# Patient Record
Sex: Male | Born: 1940
Health system: Southern US, Community
[De-identification: ages and names within clinical notes are randomized; demographics above are authoritative.]

## PROBLEM LIST (undated history)

## (undated) DIAGNOSIS — R739 Hyperglycemia, unspecified: Secondary | ICD-10-CM

## (undated) DIAGNOSIS — M1990 Unspecified osteoarthritis, unspecified site: Secondary | ICD-10-CM

## (undated) DIAGNOSIS — D509 Iron deficiency anemia, unspecified: Secondary | ICD-10-CM

## (undated) DIAGNOSIS — K315 Obstruction of duodenum: Secondary | ICD-10-CM

## (undated) DIAGNOSIS — B9681 Helicobacter pylori [H. pylori] as the cause of diseases classified elsewhere: Secondary | ICD-10-CM

## (undated) DIAGNOSIS — I2510 Atherosclerotic heart disease of native coronary artery without angina pectoris: Secondary | ICD-10-CM

## (undated) DIAGNOSIS — K2970 Gastritis, unspecified, without bleeding: Secondary | ICD-10-CM

## (undated) DIAGNOSIS — Z860100 Personal history of colon polyps, unspecified: Secondary | ICD-10-CM

## (undated) DIAGNOSIS — K219 Gastro-esophageal reflux disease without esophagitis: Secondary | ICD-10-CM

## (undated) DIAGNOSIS — K56609 Unspecified intestinal obstruction, unspecified as to partial versus complete obstruction: Secondary | ICD-10-CM

## (undated) DIAGNOSIS — K5730 Diverticulosis of large intestine without perforation or abscess without bleeding: Secondary | ICD-10-CM

## (undated) DIAGNOSIS — I255 Ischemic cardiomyopathy: Secondary | ICD-10-CM

## (undated) DIAGNOSIS — I219 Acute myocardial infarction, unspecified: Secondary | ICD-10-CM

## (undated) DIAGNOSIS — N183 Chronic kidney disease, stage 3 unspecified: Secondary | ICD-10-CM

## (undated) DIAGNOSIS — E7800 Pure hypercholesterolemia, unspecified: Secondary | ICD-10-CM

## (undated) DIAGNOSIS — K260 Acute duodenal ulcer with hemorrhage: Secondary | ICD-10-CM

## (undated) DIAGNOSIS — K760 Fatty (change of) liver, not elsewhere classified: Secondary | ICD-10-CM

## (undated) DIAGNOSIS — I10 Essential (primary) hypertension: Secondary | ICD-10-CM

## (undated) DIAGNOSIS — Z8719 Personal history of other diseases of the digestive system: Secondary | ICD-10-CM

## (undated) DIAGNOSIS — G8320 Monoplegia of upper limb affecting unspecified side: Secondary | ICD-10-CM

## (undated) HISTORY — DX: Hyperglycemia, unspecified: R73.9

## (undated) HISTORY — DX: Iron deficiency anemia, unspecified: D50.9

## (undated) HISTORY — DX: Personal history of colonic polyps: Z86.010

## (undated) HISTORY — PX: WRIST SURGERY: SHX841

## (undated) HISTORY — PX: SHOULDER SURGERY: SHX246

## (undated) HISTORY — DX: Personal history of colon polyps, unspecified: Z86.0100

## (undated) HISTORY — DX: Unspecified intestinal obstruction, unspecified as to partial versus complete obstruction: K56.609

## (undated) HISTORY — PX: COLONOSCOPY: SHX174

## (undated) HISTORY — PX: OTHER SURGICAL HISTORY: SHX169

## (undated) HISTORY — DX: Diverticulosis of large intestine without perforation or abscess without bleeding: K57.30

## (undated) HISTORY — DX: Gastro-esophageal reflux disease without esophagitis: K21.9

## (undated) HISTORY — DX: Atherosclerotic heart disease of native coronary artery without angina pectoris: I25.10

## (undated) HISTORY — DX: Acute myocardial infarction, unspecified: I21.9

## (undated) HISTORY — DX: Acute duodenal ulcer with hemorrhage: K26.0

## (undated) HISTORY — DX: Helicobacter pylori (H. pylori) as the cause of diseases classified elsewhere: B96.81

## (undated) HISTORY — DX: Obstruction of duodenum: K31.5

## (undated) HISTORY — PX: KNEE SURGERY: SHX244

## (undated) HISTORY — DX: Helicobacter pylori (H. pylori) as the cause of diseases classified elsewhere: K29.70

## (undated) HISTORY — DX: Pure hypercholesterolemia, unspecified: E78.00

## (undated) HISTORY — DX: Fatty (change of) liver, not elsewhere classified: K76.0

---

## 2002-10-12 DIAGNOSIS — I2510 Atherosclerotic heart disease of native coronary artery without angina pectoris: Secondary | ICD-10-CM

## 2002-10-12 HISTORY — DX: Atherosclerotic heart disease of native coronary artery without angina pectoris: I25.10

## 2003-09-02 ENCOUNTER — Inpatient Hospital Stay (HOSPITAL_COMMUNITY): Admission: EM | Admit: 2003-09-02 | Discharge: 2003-09-05 | Payer: Self-pay | Admitting: Emergency Medicine

## 2004-08-13 ENCOUNTER — Encounter: Admission: RE | Admit: 2004-08-13 | Discharge: 2004-08-13 | Payer: Self-pay | Admitting: Family Medicine

## 2004-10-12 HISTORY — PX: CORONARY STENT PLACEMENT: SHX1402

## 2004-10-12 HISTORY — PX: HERNIA REPAIR: SHX51

## 2004-10-12 HISTORY — PX: CARDIAC CATHETERIZATION: SHX172

## 2004-10-22 ENCOUNTER — Ambulatory Visit: Payer: Self-pay | Admitting: Family Medicine

## 2004-11-04 ENCOUNTER — Ambulatory Visit: Payer: Self-pay | Admitting: Internal Medicine

## 2004-11-07 ENCOUNTER — Ambulatory Visit: Payer: Self-pay | Admitting: Internal Medicine

## 2004-11-12 ENCOUNTER — Encounter: Payer: Self-pay | Admitting: Internal Medicine

## 2005-10-21 ENCOUNTER — Ambulatory Visit: Payer: Self-pay | Admitting: Cardiology

## 2005-10-27 ENCOUNTER — Ambulatory Visit: Payer: Self-pay | Admitting: Cardiology

## 2005-11-02 ENCOUNTER — Ambulatory Visit: Payer: Self-pay | Admitting: Internal Medicine

## 2005-11-02 ENCOUNTER — Ambulatory Visit: Payer: Self-pay | Admitting: Cardiology

## 2006-04-12 ENCOUNTER — Ambulatory Visit: Payer: Self-pay | Admitting: Cardiology

## 2006-04-15 ENCOUNTER — Ambulatory Visit: Payer: Self-pay | Admitting: Cardiology

## 2006-05-27 ENCOUNTER — Ambulatory Visit: Payer: Self-pay | Admitting: Internal Medicine

## 2006-05-27 ENCOUNTER — Inpatient Hospital Stay (HOSPITAL_COMMUNITY): Admission: EM | Admit: 2006-05-27 | Discharge: 2006-05-29 | Payer: Self-pay | Admitting: Emergency Medicine

## 2006-05-31 ENCOUNTER — Ambulatory Visit: Payer: Self-pay | Admitting: Gastroenterology

## 2006-06-15 ENCOUNTER — Ambulatory Visit (HOSPITAL_COMMUNITY): Admission: RE | Admit: 2006-06-15 | Discharge: 2006-06-15 | Payer: Self-pay | Admitting: Orthopedic Surgery

## 2006-08-13 ENCOUNTER — Ambulatory Visit: Payer: Self-pay | Admitting: Family Medicine

## 2006-12-06 ENCOUNTER — Ambulatory Visit: Payer: Self-pay | Admitting: Family Medicine

## 2006-12-06 LAB — CONVERTED CEMR LAB
ALT: 17 units/L (ref 0–40)
AST: 21 units/L (ref 0–37)
Albumin: 3.3 g/dL — ABNORMAL LOW (ref 3.5–5.2)
Alkaline Phosphatase: 41 units/L (ref 39–117)
BUN: 19 mg/dL (ref 6–23)
Basophils Absolute: 0 10*3/uL (ref 0.0–0.1)
Basophils Relative: 0.7 % (ref 0.0–1.0)
Bilirubin, Direct: 0.1 mg/dL (ref 0.0–0.3)
CO2: 30 meq/L (ref 19–32)
Calcium: 8.9 mg/dL (ref 8.4–10.5)
Chloride: 105 meq/L (ref 96–112)
Cholesterol: 110 mg/dL (ref 0–200)
Creatinine, Ser: 1.5 mg/dL (ref 0.4–1.5)
Eosinophils Absolute: 0.2 10*3/uL (ref 0.0–0.6)
Eosinophils Relative: 2.3 % (ref 0.0–5.0)
GFR calc Af Amer: 60 mL/min
GFR calc non Af Amer: 50 mL/min
Glucose, Bld: 104 mg/dL — ABNORMAL HIGH (ref 70–99)
HCT: 35.9 % — ABNORMAL LOW (ref 39.0–52.0)
HDL: 34.4 mg/dL — ABNORMAL LOW (ref >39.0)
Hemoglobin: 11.5 g/dL — ABNORMAL LOW (ref 13.0–17.0)
LDL Cholesterol: 63 mg/dL (ref 0–99)
Lymphocytes Relative: 24.2 % (ref 12.0–46.0)
MCHC: 32.1 g/dL (ref 30.0–36.0)
MCV: 82 fL (ref 78.0–100.0)
Monocytes Absolute: 0.7 10*3/uL (ref 0.2–0.7)
Monocytes Relative: 9.8 % (ref 3.0–11.0)
Neutro Abs: 4.2 10*3/uL (ref 1.4–7.7)
Neutrophils Relative %: 63 % (ref 43.0–77.0)
PSA: 0.95 ng/mL
PSA: 0.95 ng/mL (ref 0.10–4.00)
Platelets: 260 10*3/uL (ref 150–400)
Potassium: 4.3 meq/L (ref 3.5–5.1)
RBC: 4.38 M/uL (ref 4.22–5.81)
RDW: 17.8 % — ABNORMAL HIGH (ref 11.5–14.6)
Sodium: 140 meq/L (ref 135–145)
TSH: 2 microintl units/mL (ref 0.35–5.50)
Total Bilirubin: 0.6 mg/dL (ref 0.3–1.2)
Total CHOL/HDL Ratio: 3.2
Total Protein: 6.3 g/dL (ref 6.0–8.3)
Triglycerides: 64 mg/dL (ref 0–149)
VLDL: 13 mg/dL (ref 0–40)
WBC: 6.7 10*3/uL (ref 4.5–10.5)

## 2006-12-08 ENCOUNTER — Ambulatory Visit: Payer: Self-pay | Admitting: Family Medicine

## 2007-01-03 LAB — FECAL OCCULT BLOOD, GUAIAC: Fecal Occult Blood: NEGATIVE

## 2007-01-04 ENCOUNTER — Ambulatory Visit: Payer: Self-pay | Admitting: Family Medicine

## 2007-02-18 ENCOUNTER — Ambulatory Visit (HOSPITAL_BASED_OUTPATIENT_CLINIC_OR_DEPARTMENT_OTHER): Admission: RE | Admit: 2007-02-18 | Discharge: 2007-02-18 | Payer: Self-pay | Admitting: Surgery

## 2007-03-03 ENCOUNTER — Encounter: Payer: Self-pay | Admitting: Family Medicine

## 2007-03-07 DIAGNOSIS — I252 Old myocardial infarction: Secondary | ICD-10-CM | POA: Insufficient documentation

## 2007-03-08 ENCOUNTER — Ambulatory Visit: Payer: Self-pay | Admitting: Family Medicine

## 2007-03-08 DIAGNOSIS — K219 Gastro-esophageal reflux disease without esophagitis: Secondary | ICD-10-CM | POA: Insufficient documentation

## 2007-03-10 ENCOUNTER — Encounter: Payer: Self-pay | Admitting: Family Medicine

## 2007-04-08 ENCOUNTER — Ambulatory Visit: Payer: Self-pay | Admitting: Cardiology

## 2007-07-30 ENCOUNTER — Encounter: Payer: Self-pay | Admitting: Family Medicine

## 2007-07-30 ENCOUNTER — Ambulatory Visit: Payer: Self-pay | Admitting: Family Medicine

## 2007-09-05 ENCOUNTER — Encounter (INDEPENDENT_AMBULATORY_CARE_PROVIDER_SITE_OTHER): Payer: Self-pay | Admitting: *Deleted

## 2007-09-13 ENCOUNTER — Ambulatory Visit: Payer: Self-pay | Admitting: Family Medicine

## 2007-09-21 ENCOUNTER — Ambulatory Visit: Payer: Self-pay | Admitting: Family Medicine

## 2007-10-02 ENCOUNTER — Ambulatory Visit: Payer: Self-pay | Admitting: Internal Medicine

## 2007-10-02 ENCOUNTER — Inpatient Hospital Stay (HOSPITAL_COMMUNITY): Admission: EM | Admit: 2007-10-02 | Discharge: 2007-10-05 | Payer: Self-pay | Admitting: Emergency Medicine

## 2007-10-03 ENCOUNTER — Encounter: Payer: Self-pay | Admitting: Family Medicine

## 2007-10-10 ENCOUNTER — Ambulatory Visit: Payer: Self-pay | Admitting: Gastroenterology

## 2007-11-04 ENCOUNTER — Ambulatory Visit: Payer: Self-pay | Admitting: Family Medicine

## 2007-11-07 ENCOUNTER — Encounter: Payer: Self-pay | Admitting: Internal Medicine

## 2007-11-07 ENCOUNTER — Ambulatory Visit: Payer: Self-pay | Admitting: Internal Medicine

## 2007-11-07 DIAGNOSIS — Z86010 Personal history of colonic polyps: Secondary | ICD-10-CM | POA: Insufficient documentation

## 2007-11-07 LAB — CONVERTED CEMR LAB
Basophils Absolute: 0.1 10*3/uL (ref 0.0–0.1)
Basophils Relative: 0.8 % (ref 0.0–1.0)
Eosinophils Absolute: 0.2 10*3/uL (ref 0.0–0.6)
Eosinophils Relative: 3.2 % (ref 0.0–5.0)
Ferritin: 9.1 ng/mL — ABNORMAL LOW (ref 22.0–322.0)
HCT: 32.9 % — ABNORMAL LOW (ref 39.0–52.0)
Hemoglobin: 10.6 g/dL — ABNORMAL LOW (ref 13.0–17.0)
Lymphocytes Relative: 20.4 % (ref 12.0–46.0)
MCHC: 32.1 g/dL (ref 30.0–36.0)
MCV: 84 fL (ref 78.0–100.0)
Monocytes Absolute: 0.8 10*3/uL — ABNORMAL HIGH (ref 0.2–0.7)
Monocytes Relative: 12 % — ABNORMAL HIGH (ref 3.0–11.0)
Neutro Abs: 4 10*3/uL (ref 1.4–7.7)
Neutrophils Relative %: 63.6 % (ref 43.0–77.0)
Platelets: 346 10*3/uL (ref 150–400)
RBC: 3.92 M/uL — ABNORMAL LOW (ref 4.22–5.81)
RDW: 14.9 % — ABNORMAL HIGH (ref 11.5–14.6)
WBC: 6.4 10*3/uL (ref 4.5–10.5)

## 2007-11-14 ENCOUNTER — Ambulatory Visit: Payer: Self-pay | Admitting: Internal Medicine

## 2007-11-14 ENCOUNTER — Encounter: Payer: Self-pay | Admitting: Family Medicine

## 2007-11-14 LAB — HM COLONOSCOPY

## 2007-12-19 ENCOUNTER — Ambulatory Visit: Payer: Self-pay | Admitting: Internal Medicine

## 2007-12-21 ENCOUNTER — Encounter: Payer: Self-pay | Admitting: Family Medicine

## 2007-12-21 ENCOUNTER — Encounter: Payer: Self-pay | Admitting: Internal Medicine

## 2007-12-21 DIAGNOSIS — A048 Other specified bacterial intestinal infections: Secondary | ICD-10-CM | POA: Insufficient documentation

## 2007-12-25 ENCOUNTER — Encounter: Payer: Self-pay | Admitting: Family Medicine

## 2008-01-04 ENCOUNTER — Ambulatory Visit: Payer: Self-pay | Admitting: Internal Medicine

## 2008-01-04 ENCOUNTER — Ambulatory Visit: Payer: Self-pay | Admitting: Family Medicine

## 2008-01-04 DIAGNOSIS — E785 Hyperlipidemia, unspecified: Secondary | ICD-10-CM

## 2008-01-04 DIAGNOSIS — E1169 Type 2 diabetes mellitus with other specified complication: Secondary | ICD-10-CM | POA: Insufficient documentation

## 2008-01-04 LAB — CONVERTED CEMR LAB
ALT: 18 U/L
AST: 21 U/L
Albumin: 3.5 g/dL
Alkaline Phosphatase: 37 U/L — ABNORMAL LOW
BUN: 18 mg/dL
Basophils Absolute: 0 K/uL
Basophils Relative: 0.7 %
Bilirubin, Direct: 0.1 mg/dL
CO2: 31 meq/L
Calcium: 8.9 mg/dL
Chloride: 107 meq/L
Cholesterol: 97 mg/dL
Creatinine, Ser: 1.6 mg/dL — ABNORMAL HIGH
Creatinine,U: 152.3 mg/dL
Eosinophils Absolute: 0.1 K/uL
Eosinophils Relative: 1.3 %
Ferritin: 14.7 ng/mL — ABNORMAL LOW
GFR calc Af Amer: 56 mL/min
GFR calc non Af Amer: 46 mL/min
Glucose, Bld: 97 mg/dL
HCT: 38.4 % — ABNORMAL LOW
HDL: 29 mg/dL — ABNORMAL LOW
Hemoglobin: 12 g/dL — ABNORMAL LOW
LDL Cholesterol: 57 mg/dL
Lymphocytes Relative: 22.9 %
MCHC: 31.3 g/dL
MCV: 84.6 fL
Microalb Creat Ratio: 3.3 mg/g
Microalb, Ur: 0.5 mg/dL
Monocytes Absolute: 0.5 K/uL
Monocytes Relative: 9.2 %
Neutro Abs: 3.9 K/uL
Neutrophils Relative %: 65.9 %
PSA: 0.83 ng/mL
Platelets: 257 K/uL
Potassium: 4.1 meq/L
RBC: 4.53 M/uL
RDW: 20.6 % — ABNORMAL HIGH
Sodium: 139 meq/L
TSH: 2.19 u[IU]/mL
Total Bilirubin: 0.6 mg/dL
Total CHOL/HDL Ratio: 3.3
Total Protein: 6.4 g/dL
Triglycerides: 57 mg/dL
VLDL: 11 mg/dL
Vitamin B-12: 171 pg/mL — ABNORMAL LOW
WBC: 5.8 10*3/microliter

## 2008-01-09 ENCOUNTER — Ambulatory Visit: Payer: Self-pay | Admitting: Family Medicine

## 2008-01-09 DIAGNOSIS — D509 Iron deficiency anemia, unspecified: Secondary | ICD-10-CM | POA: Insufficient documentation

## 2008-01-10 ENCOUNTER — Telehealth: Payer: Self-pay | Admitting: Family Medicine

## 2008-03-12 ENCOUNTER — Ambulatory Visit: Payer: Self-pay | Admitting: Internal Medicine

## 2008-03-13 ENCOUNTER — Ambulatory Visit: Payer: Self-pay | Admitting: Cardiology

## 2008-06-11 ENCOUNTER — Ambulatory Visit: Payer: Self-pay | Admitting: Family Medicine

## 2008-07-06 ENCOUNTER — Ambulatory Visit: Payer: Self-pay | Admitting: Family Medicine

## 2008-07-09 LAB — CONVERTED CEMR LAB
Basophils Absolute: 0 10*3/uL (ref 0.0–0.1)
Basophils Relative: 0.6 % (ref 0.0–3.0)
Eosinophils Absolute: 0.1 10*3/uL (ref 0.0–0.7)
Eosinophils Relative: 1.5 % (ref 0.0–5.0)
HCT: 43.7 % (ref 39.0–52.0)
Hemoglobin: 14.1 g/dL (ref 13.0–17.0)
Iron: 68 ug/dL (ref 42–165)
Lymphocytes Relative: 28.4 % (ref 12.0–46.0)
MCHC: 32.2 g/dL (ref 30.0–36.0)
MCV: 90.8 fL (ref 78.0–100.0)
Monocytes Absolute: 0.6 10*3/uL (ref 0.1–1.0)
Monocytes Relative: 9.8 % (ref 3.0–12.0)
Neutro Abs: 3.9 10*3/uL (ref 1.4–7.7)
Neutrophils Relative %: 59.7 % (ref 43.0–77.0)
Platelets: 247 10*3/uL (ref 150–400)
RBC: 4.82 M/uL (ref 4.22–5.81)
RDW: 14.7 % — ABNORMAL HIGH (ref 11.5–14.6)
Vitamin B-12: 330 pg/mL (ref 211–911)
WBC: 6.4 10*3/uL (ref 4.5–10.5)

## 2008-07-10 ENCOUNTER — Ambulatory Visit: Payer: Self-pay | Admitting: Family Medicine

## 2008-08-28 ENCOUNTER — Ambulatory Visit: Payer: Self-pay | Admitting: Family Medicine

## 2008-08-28 ENCOUNTER — Telehealth: Payer: Self-pay | Admitting: Internal Medicine

## 2008-09-26 ENCOUNTER — Ambulatory Visit: Payer: Self-pay | Admitting: Internal Medicine

## 2008-10-29 ENCOUNTER — Encounter: Payer: Self-pay | Admitting: Family Medicine

## 2008-11-12 ENCOUNTER — Encounter: Payer: Self-pay | Admitting: Family Medicine

## 2008-12-12 ENCOUNTER — Telehealth: Payer: Self-pay | Admitting: Family Medicine

## 2008-12-27 ENCOUNTER — Ambulatory Visit: Payer: Self-pay | Admitting: Family Medicine

## 2009-02-11 ENCOUNTER — Telehealth: Payer: Self-pay | Admitting: Family Medicine

## 2009-03-13 DIAGNOSIS — N183 Chronic kidney disease, stage 3 unspecified: Secondary | ICD-10-CM | POA: Insufficient documentation

## 2009-03-13 DIAGNOSIS — I25118 Atherosclerotic heart disease of native coronary artery with other forms of angina pectoris: Secondary | ICD-10-CM | POA: Insufficient documentation

## 2009-03-13 DIAGNOSIS — E1122 Type 2 diabetes mellitus with diabetic chronic kidney disease: Secondary | ICD-10-CM | POA: Insufficient documentation

## 2009-03-13 DIAGNOSIS — I2510 Atherosclerotic heart disease of native coronary artery without angina pectoris: Secondary | ICD-10-CM | POA: Insufficient documentation

## 2009-03-13 DIAGNOSIS — N1832 Chronic kidney disease, stage 3b: Secondary | ICD-10-CM | POA: Insufficient documentation

## 2009-03-14 ENCOUNTER — Ambulatory Visit: Payer: Self-pay | Admitting: Cardiovascular Disease

## 2009-03-14 DIAGNOSIS — R0989 Other specified symptoms and signs involving the circulatory and respiratory systems: Secondary | ICD-10-CM | POA: Insufficient documentation

## 2009-03-14 DIAGNOSIS — R0609 Other forms of dyspnea: Secondary | ICD-10-CM | POA: Insufficient documentation

## 2009-03-20 ENCOUNTER — Telehealth (INDEPENDENT_AMBULATORY_CARE_PROVIDER_SITE_OTHER): Payer: Self-pay | Admitting: *Deleted

## 2009-03-26 ENCOUNTER — Telehealth (INDEPENDENT_AMBULATORY_CARE_PROVIDER_SITE_OTHER): Payer: Self-pay | Admitting: *Deleted

## 2009-03-27 ENCOUNTER — Encounter: Payer: Self-pay | Admitting: Cardiovascular Disease

## 2009-03-27 ENCOUNTER — Ambulatory Visit: Payer: Self-pay

## 2009-03-27 LAB — HM DIABETES EYE EXAM

## 2009-05-29 ENCOUNTER — Encounter (INDEPENDENT_AMBULATORY_CARE_PROVIDER_SITE_OTHER): Payer: Self-pay | Admitting: *Deleted

## 2009-06-10 ENCOUNTER — Ambulatory Visit: Payer: Self-pay | Admitting: Family Medicine

## 2009-06-10 LAB — CONVERTED CEMR LAB
ALT: 18 units/L (ref 0–53)
AST: 25 units/L (ref 0–37)
Albumin: 3.5 g/dL (ref 3.5–5.2)
Alkaline Phosphatase: 35 units/L — ABNORMAL LOW (ref 39–117)
BUN: 21 mg/dL (ref 6–23)
Basophils Absolute: 0 10*3/uL (ref 0.0–0.1)
Basophils Relative: 0.2 % (ref 0.0–3.0)
Bilirubin, Direct: 0.1 mg/dL (ref 0.0–0.3)
CO2: 29 meq/L (ref 19–32)
Calcium: 8.7 mg/dL (ref 8.4–10.5)
Chloride: 108 meq/L (ref 96–112)
Cholesterol: 91 mg/dL (ref 0–200)
Creatinine, Ser: 1.5 mg/dL (ref 0.4–1.5)
Creatinine,U: 166.4 mg/dL
Eosinophils Absolute: 0.1 10*3/uL (ref 0.0–0.7)
Eosinophils Relative: 1.3 % (ref 0.0–5.0)
GFR calc non Af Amer: 49.38 mL/min (ref >60)
Glucose, Bld: 96 mg/dL (ref 70–99)
HCT: 42.1 % (ref 39.0–52.0)
HDL: 32.5 mg/dL — ABNORMAL LOW (ref >39.00)
Hemoglobin: 14.1 g/dL (ref 13.0–17.0)
Iron: 68 ug/dL (ref 42–165)
LDL Cholesterol: 46 mg/dL (ref 0–99)
Lymphocytes Relative: 29 % (ref 12.0–46.0)
Lymphs Abs: 1.7 10*3/uL (ref 0.7–4.0)
MCHC: 33.5 g/dL (ref 30.0–36.0)
MCV: 93.2 fL (ref 78.0–100.0)
Microalb Creat Ratio: 1.2 mg/g (ref 0.0–30.0)
Microalb, Ur: 0.2 mg/dL (ref 0.0–1.9)
Monocytes Absolute: 0.6 10*3/uL (ref 0.1–1.0)
Monocytes Relative: 10.4 % (ref 3.0–12.0)
Neutro Abs: 3.4 10*3/uL (ref 1.4–7.7)
Neutrophils Relative %: 59.1 % (ref 43.0–77.0)
PSA: 0.88 ng/mL (ref 0.10–4.00)
Platelets: 196 10*3/uL (ref 150.0–400.0)
Potassium: 4.3 meq/L (ref 3.5–5.1)
RBC: 4.51 M/uL (ref 4.22–5.81)
RDW: 13.5 % (ref 11.5–14.6)
Sodium: 140 meq/L (ref 135–145)
Total Bilirubin: 0.7 mg/dL (ref 0.3–1.2)
Total CHOL/HDL Ratio: 3
Total Protein: 6.6 g/dL (ref 6.0–8.3)
Triglycerides: 63 mg/dL (ref 0.0–149.0)
VLDL: 12.6 mg/dL (ref 0.0–40.0)
Vitamin B-12: 235 pg/mL (ref 211–911)
WBC: 5.8 10*3/uL (ref 4.5–10.5)

## 2009-06-11 LAB — CONVERTED CEMR LAB: Vit D, 25-Hydroxy: 22 ng/mL — ABNORMAL LOW (ref 30–89)

## 2009-06-13 ENCOUNTER — Ambulatory Visit: Payer: Self-pay | Admitting: Family Medicine

## 2009-12-18 ENCOUNTER — Ambulatory Visit: Payer: Self-pay | Admitting: Family Medicine

## 2009-12-18 DIAGNOSIS — N390 Urinary tract infection, site not specified: Secondary | ICD-10-CM | POA: Insufficient documentation

## 2009-12-18 LAB — CONVERTED CEMR LAB
Bilirubin Urine: NEGATIVE
Glucose, Urine, Semiquant: NEGATIVE
Ketones, urine, test strip: NEGATIVE
Nitrite: NEGATIVE
Protein, U semiquant: NEGATIVE
Specific Gravity, Urine: 1.015
Urobilinogen, UA: 0.2
pH: 6

## 2010-05-20 ENCOUNTER — Encounter (INDEPENDENT_AMBULATORY_CARE_PROVIDER_SITE_OTHER): Payer: Self-pay | Admitting: *Deleted

## 2010-05-30 ENCOUNTER — Telehealth: Payer: Self-pay | Admitting: Cardiovascular Disease

## 2010-07-08 ENCOUNTER — Ambulatory Visit: Payer: Self-pay | Admitting: Cardiovascular Disease

## 2010-09-27 ENCOUNTER — Encounter: Payer: Self-pay | Admitting: Internal Medicine

## 2010-09-27 ENCOUNTER — Emergency Department (HOSPITAL_COMMUNITY)
Admission: EM | Admit: 2010-09-27 | Discharge: 2010-09-27 | Payer: Self-pay | Source: Home / Self Care | Admitting: Emergency Medicine

## 2010-10-15 ENCOUNTER — Ambulatory Visit
Admission: RE | Admit: 2010-10-15 | Discharge: 2010-10-15 | Payer: Self-pay | Source: Home / Self Care | Attending: Family Medicine | Admitting: Family Medicine

## 2010-10-15 DIAGNOSIS — R1011 Right upper quadrant pain: Secondary | ICD-10-CM | POA: Insufficient documentation

## 2010-10-15 DIAGNOSIS — M25539 Pain in unspecified wrist: Secondary | ICD-10-CM | POA: Insufficient documentation

## 2010-11-03 ENCOUNTER — Telehealth (INDEPENDENT_AMBULATORY_CARE_PROVIDER_SITE_OTHER): Payer: Self-pay | Admitting: *Deleted

## 2010-11-11 NOTE — Progress Notes (Signed)
Summary: [facility]  [facility]   Imported By: Beau Fanny 08/02/2007 11:47:47  _____________________________________________________________________  External Attachment:    Type:   Image     Comment:   External Document

## 2010-11-11 NOTE — Assessment & Plan Note (Signed)
Summary: FOLLOW UP / LFW   Vital Signs:  Patient Profile:   70 Years Old Male Weight:      259 pounds Temp:     98.5 degrees F oral Pulse rate:   88 / minute Pulse rhythm:   regular BP sitting:   120 / 60  (left arm) Cuff size:   regular  Vitals Entered By: Providence Crosby (September 13, 2007 8:17 AM)                 Chief Complaint:  6 month f/u.  History of Present Illness: No complaints except occas substernal chest discomfort as long as to hour, nonexertional which Zantasc helps...discussed approach. Otherwise is doing well without complaint.  Current Allergies (reviewed today): ! ZOCOR MORPHINE CODEINE * MAXEQUIN      Physical Exam  General:     Well-developed,well-nourished,in no acute distress; alert,appropriate and cooperative throughout examination Head:     Normocephalic and atraumatic without obvious abnormalities. No apparent alopecia or balding. Eyes:     Conjunctiva clear bilaterally.  Mild chronic stye of left lower eyelid. Ears:     External ear exam shows no significant lesions or deformities.  Otoscopic examination reveals clear canals, tympanic membranes are intact bilaterally without bulging, retraction, inflammation or discharge. Hearing is grossly normal bilaterally. Nose:     External nasal examination shows no deformity or inflammation. Nasal mucosa are pink and moist without lesions or exudates. Mouth:     Oral mucosa and oropharynx without lesions or exudates.  Teeth in good repair. Neck:     No deformities, masses, or tenderness noted. Chest Wall:     No deformities, masses, tenderness or gynecomastia noted. Lungs:     Normal respiratory effort, chest expands symmetrically. Lungs are clear to auscultation, no crackles or wheezes. Heart:     Normal rate and regular rhythm. S1 and S2 normal without gallop, murmur, click, rub or other extra sounds.    Impression & Recommendations:  Problem # 1:  GERD (ICD-530.81) Assessment:  Unchanged Discussed conservative approach.  Diagnostics Reviewed:  Discussed lifestyle modifications, diet, antacids/medications, and preventive measures. Handout provided.   Problem # 2:  HYPERGLYCEMIA (ICD-790.6) Assessment: Unchanged Avoid sweets and carbs, will recheck next visit.  Problem # 3:  Hx of CAD (ICD-414.00) Assessment: Unchanged Stable. The following medications were removed from the medication list:    Toprol Xl 50 Mg Tb24 (Metoprolol succinate) .Marland Kitchen... Take one by mouth daily  His updated medication list for this problem includes:    Altace 10 Mg Caps (Ramipril) .Marland Kitchen... Take one by mouth daily    Aspir-low 81 Mg Tbec (Aspirin) .Marland Kitchen... 1 qd   Complete Medication List: 1)  Lipitor 20 Mg Tabs (Atorvastatin calcium) .... Take one by mouth daily 2)  Altace 10 Mg Caps (Ramipril) .... Take one by mouth daily 3)  Aspir-low 81 Mg Tbec (Aspirin) .Marland Kitchen.. 1 qd   Patient Instructions: 1)  RTC for next avail Comp Exam (after 2/09) labs prior. 2)  RETC sooner as needed.    ]

## 2010-11-11 NOTE — Assessment & Plan Note (Signed)
Summary: CPX/RBH   Vital Signs:  Patient Profile:   70 Years Old Male Height:     72 inches Weight:      259 pounds Temp:     96.6 degrees F tympanic Pulse rate:   76 / minute Pulse rhythm:   regular BP sitting:   100 / 60  (left arm) Cuff size:   large  Vitals Entered By: Providence Crosby (January 09, 2008 3:11 PM)                 PCP:  Hetty Ely  Chief Complaint:  check up //had colonoscopy 11/14/2007.  History of Present Illness: Pt here for Comp Exam.  Has had the crud a few times and has gotten over most of that...with last episode got out too early thereafter and finally got over that recently. Has recently seen Dr Leone Payor and had GI workup.  Is now on Amox for H.Pylori infection?  DrGessner's notes say triple therapy ith Levaquin, Amox and Prilosec but that is not what he is on.Marland KitchenMarland KitchenI suspect a disconnect. Will treat per his orders. O/W doing well and feeling fine.    Prior Medications Reviewed Using: List Brought by Patient  Current Allergies (reviewed today): ! ZOCOR MORPHINE CODEINE * MAXEQUIN  Past Medical History:    Diverticulosis, colon    Renal insufficiency    Elevated glucose- 104 (11/2006)    Myocardial infarction, hx of- s/p stent LAD     Congenital abnormality right arm          Past Surgical History:    Stress cardiolite- normal EF 66% (02/1998)    Echo- mild M.R., mild T.R. (02/07/2001)    [facility]- Mi Apex, 90% lesion Stented  EF 45%    Stress cardiolite- no change EF 54% (06/26/2004)    Abd [place]- normal (08/13/2004)    Colonoscopy- polyp, divertic (11/07/2004)         18yrs    [facility]- PUD, GI bleed.  Off plavix, ASA x 2 weeks (08/16-08/18/2007)    EGD- Duodenitis w/o hemorrhage (05/27/2001)    Inguinal herniorrhaphy- right (02/18/2007)    EGD Bleeding ulcer in Duod bulb, fulgurated  10/03/07    Colonoscopy Divertics Int Hemms No polyps but previously (Dr Leone Payor) (2/2 08)    Colonoscopy Recurrent H. Pylori needs PPI forever (Dr Leone Payor) (12/21/2007)    Family History:    Father: dec CHF, DM, kidney failure- died age 3    Mother:dec  pneumonia, died age 63    Sister A 84 HTn Dm    Sister A 70  Htn Arthritis    Sister A 26    Sister A 634   Risk Factors:  Passive smoke exposure:  no Drug use:  no HIV high-risk behavior:  no Caffeine use:  2 drinks per day Alcohol use:  yes    Type:  beer 3x/yr    Drinks per day:  0    Has patient --       Felt need to cut down:  no       Been annoyed by complaints:  no       Felt guilty about drinking:  no       Needed eye opener in the morning:  no    Counseled to quit/cut down alcohol use:  no Exercise:  no Seatbelt use:  100 %   Review of Systems  General      Denies chills, fatigue, fever, loss of appetite, malaise, sleep disorder, sweats, weakness, and  weight loss.  Eyes      Denies blurring, discharge, double vision, eye irritation, eye pain, halos, itching, light sensitivity, red eye, vision loss-1 eye, and vision loss-both eyes.  ENT      Denies decreased hearing, difficulty swallowing, ear discharge, earache, hoarseness, nasal congestion, nosebleeds, postnasal drainage, ringing in ears, sinus pressure, and sore throat.  CV      Denies bluish discoloration of lips or nails, chest pain or discomfort, difficulty breathing at night, difficulty breathing while lying down, fainting, fatigue, leg cramps with exertion, lightheadness, near fainting, palpitations, shortness of breath with exertion, swelling of feet, swelling of hands, and weight gain.  Resp      Denies chest discomfort, chest pain with inspiration, cough, coughing up blood, excessive snoring, hypersomnolence, morning headaches, pleuritic, shortness of breath, sputum productive, and wheezing.  GI      Denies abdominal pain, bloody stools, change in bowel habits, constipation, dark tarry stools, diarrhea, excessive appetite, gas, hemorrhoids, indigestion, loss of appetite, nausea, vomiting, vomiting blood, and yellowish  skin color.  GU      Complains of nocturia.      Denies decreased libido, discharge, dysuria, erectile dysfunction, genital sores, hematuria, incontinence, urinary frequency, and urinary hesitancy.      1-2  MS      Complains of joint pain.      Denies joint redness, joint swelling, loss of strength, low back pain, mid back pain, muscle aches, muscle , cramps, muscle weakness, stiffness, and thoracic pain.      occas with coldness  Derm      Complains of dryness.      Denies changes in color of skin, changes in nail beds, excessive perspiration, flushing, hair loss, insect bite(s), itching, lesion(s), poor wound healing, and rash.      esp in winter.  Neuro      Denies brief paralysis, difficulty with concentration, disturbances in coordination, falling down, headaches, inability to speak, memory loss, numbness, poor balance, seizures, sensation of room spinning, tingling, tremors, visual disturbances, and weakness.   Physical Exam  General:     Well-developed,well-nourished,in no acute distress; alert,appropriate and cooperative throughout examination Head:     Normocephalic and atraumatic without obvious abnormalities. No apparent alopecia or balding. Eyes:     Conjunctiva clear bilaterally.  Ears:     External ear exam shows no significant lesions or deformities.  Otoscopic examination reveals clear canals, tympanic membranes are intact bilaterally without bulging, retraction, inflammation or discharge. Hearing is grossly normal bilaterally. Nose:     External nasal examination shows no deformity or inflammation. Nasal mucosa are pink and moist without lesions or exudates. Mouth:     Oral mucosa and oropharynx without lesions or exudates.  Teeth in good repair. Neck:     No deformities, masses, or tenderness noted. Chest Wall:     No deformities, masses, tenderness or gynecomastia noted. Breasts:     No masses or gynecomastia noted Lungs:     Normal respiratory effort,  chest expands symmetrically. Lungs are clear to auscultation, no crackles or wheezes. Heart:     Normal rate and regular rhythm. S1 and S2 normal without gallop, murmur, click, rub or other extra sounds. Abdomen:     Bowel sounds positive,abdomen soft and non-tender without masses, organomegaly or hernias noted. Rectal:     No external abnormalities noted. Normal sphincter tone. No rectal masses or tenderness. G neg. Genitalia:     Testes bilaterally descended without nodularity, tenderness or masses. No  scrotal masses or lesions. No penis lesions or urethral discharge. Prostate:     Prostate gland firm and smooth, no enlargement, nodularity, tenderness, mass, asymmetry or induration. 30-40gms. Msk:     No deformity or scoliosis noted of thoracic or lumbar spine.   Pulses:     R and L carotid,radial,femoral,dorsalis pedis and posterior tibial pulses are full and equal bilaterally Extremities:     No clubbing, cyanosis, edema, or deformity noted with normal full range of motion of all joints.  Deformity but use of right arm due to malformation. Neurologic:     No cranial nerve deficits noted. Station and gait are normal. Plantar reflexes are down-going bilaterally. DTRs are symmetrical throughout. Sensory, motor and coordinative functions appear intact. Skin:     Intact without suspicious lesions or rashes Cervical Nodes:     No lymphadenopathy noted Inguinal Nodes:     No significant adenopathy Psych:     Cognition and judgment appear intact. Alert and cooperative with normal attention span and concentration. No apparent delusions, illusions, hallucinations    Impression & Recommendations:  Problem # 1:  HEALTH MAINTENANCE EXAM (ICD-V70.0) Assessment: Comment Only  Problem # 2:  ANEMIA, IRON DEFICIENCY (ICD-280.9) Assessment: Improved Still nemic however...needs to take iron two times a day and B12 as high an OTC oral dose as he can find.  Will try to keep him off B12 shots.  Hgb: 12.0 (01/04/2008)   Hct: 38.4 (01/04/2008)   RDW: 20.6 (01/04/2008)   MCV: 84.6 (01/04/2008)   MCHC: 31.3 (01/04/2008) Ferritin: 14.7 (01/04/2008) B12: 171 (01/04/2008)   TSH: 2.19 (01/04/2008)   Problem # 3:  HELICOBACTER PYLORI GASTRITIS, RECURRENT (NEEDS PPI FOREVER) (ICD-041.86) Will put on triple therapy per Dr Marvell Fuller note and have him return to GI thereafter.  Problem # 4:  HYPERCHOLESTEROLEMIA (ICD-272.0)  His updated medication list for this problem includes:    Lipitor 20 Mg Tabs (Atorvastatin calcium) .Marland Kitchen... Take one by mouth daily  Labs Reviewed: Chol: 97 (01/04/2008)   HDL: 29.0 (01/04/2008)   LDL: 57 (01/04/2008)   TG: 57 (01/04/2008) SGOT: 21 (01/04/2008)   SGPT: 18 (01/04/2008)   Problem # 5:  ACUTE POSTHEMORRHAGIC ANEMIA (ICD-285.1) Assessment: Improved See prior note. Hgb: 12.0 (01/04/2008)   Hct: 38.4 (01/04/2008)   RDW: 20.6 (01/04/2008)   MCV: 84.6 (01/04/2008)   MCHC: 31.3 (01/04/2008) Ferritin: 14.7 (01/04/2008) B12: 171 (01/04/2008)   TSH: 2.19 (01/04/2008)   Problem # 6:  HYPERGLYCEMIA (ICD-790.6) Assessment: Improved Back to normal, will continue to follow.  Problem # 7:  MYOCARDIAL INFARCTION, HX OF (ICD-412) Assessment: Unchanged Seems stable. His updated medication list for this problem includes:    Altace 10 Mg Caps (Ramipril) .Marland Kitchen... Take one by mouth daily    Anacin 81 Mg Tbec (Aspirin) ..... Qd  Labs Reviewed: Chol: 97 (01/04/2008)   HDL: 29.0 (01/04/2008)   LDL: 57 (01/04/2008)   TG: 57 (01/04/2008)   Complete Medication List: 1)  Lipitor 20 Mg Tabs (Atorvastatin calcium) .... Take one by mouth daily 2)  Altace 10 Mg Caps (Ramipril) .... Take one by mouth daily 3)  Protonix 40 Mg Tbec (Pantoprazole sodium) .... Take 1 tablet by mouth twice a day 4)  Azithromycin 250 Mg Tabs (Azithromycin) .... 2 tab by mouth x 1 day, then 1 tab by mouth daily 5)  Anacin 81 Mg Tbec (Aspirin) .... Qd 6)  Levaquin 250 Mg Tabs (Levofloxacin) .... One  tab by mouth bid 7)  Amoxicillin 500 Mg Tabs (Amoxicillin) .... 2  tabs by mouth bid   Patient Instructions: 1)  RTC 6 mos, check B12, Fe CBC Prior 280.9    Prescriptions: LEVAQUIN 250 MG  TABS (LEVOFLOXACIN) one tab by mouth bid  #28 x 0   Entered and Authorized by:   Shaune Leeks MD   Signed by:   Shaune Leeks MD on 01/09/2008   Method used:   Print then Give to Patient   RxID:   [id number] AMOXICILLIN 500 MG  TABS (AMOXICILLIN) 2 tabs by mouth bid  #80 x 0   Entered and Authorized by:   Shaune Leeks MD   Signed by:   Shaune Leeks MD on 01/09/2008   Method used:   Print then Give to Patient   RxID:   [id number]  ]

## 2010-11-11 NOTE — Letter (Signed)
Summary: Generic Letter  [place] [facility]  [address] [place], [place] [address]   Phone: [phone]  Fax: [phone]    11/12/2008  Luis Sweeney [address] [place], [place]  [address]  Dear Mr. SCHIAVO,     Your insurance Company has requested you change medication from Lipitor to a Generic Brand on their formulary.  You have been on Zocor (Simvastatin) in the past and were switched to Lipitor due to Myalgias that were intolerable. You have done well on Lipitor since 12/04, over five years.  In addition, Lipitor is due to go generic soon.    I do not feel it is in your best interest to switch to another generic at this point.  I hope your insurance company agrees.          Sincerely,      Laurita Quint, MD [place] [facility]    cc: [facility]

## 2010-11-11 NOTE — Progress Notes (Signed)
Summary: [facility] denied additional refill  Phone Note Call from Patient   Summary of Call: Wife called for an authorization for pts lipator.  Told wife a ltr was mailed to the pt  and ins co.,  explaining why pt should remain on medication.. Ins co. approved 1 time refill for Lipator, only because he was a new pt of [facility].Marland KitchenMarland KitchenDaine Gip  December 12, 2008 10:09 AM   [facility].  Pt needs to try Crestor and or Vytorin and fail, before they will approve Lipator.  FYI to Dr. Hetty Ely and to the pt.. .Marland KitchenMarland KitchenDaine Gip  December 12, 2008 10:24 AM Initial call taken by: Daine Gip,  December 12, 2008 10:09 AM  Follow-up for Phone Call        Please have pt come in to change med. lmom Providence Crosby  December 17, 2008 12:46 PM  Follow-up by: Shaune Leeks MD,  December 16, 2008 4:04 PM  Additional Follow-up for Phone Call Additional follow up Details #1::        patient notified appt made for next week 1box of lipitor 20 mg to patient lot [id number]// expires 01/2011 until patient can come in to discuss Additional Follow-up by: Providence Crosby,  December 17, 2008 4:24 PM

## 2010-11-11 NOTE — Letter (Signed)
Summary: [place] No Show Letter  [place] [facility]  [address] [place], [place] [address]   Phone: [phone]  Fax: [phone]    09/05/2007   Dear Mr. ESPINA,   Our records indicate that you missed your scheduled appointment with _DR. SCHALLER____________________ on _11/24/2008 AT 9:15 AM___________.  Please contact this office to reschedule your appointment as soon as possible.  It is important that you keep your scheduled appointments with your physician, so we can provide you the best care possible.  Please be advised that there may be a charge for "no show" appointments.    Sincerely,   [place] at [facility]

## 2010-11-11 NOTE — Progress Notes (Signed)
Summary: regarding cardiologist  Phone Note Call from Patient Call back at [phone] or [phone]   Caller: Patient Call For: Shaune Leeks MD Summary of Call: Pt has been assigned to Dr Tonny Bollman  at [facility], his previous doctor left town.  Pt is asking if you can recommend this doctor or do you prefer someone else, or if he even needs to see anybody at all. Initial call taken by: Lowella Petties,  Feb 11, 2009 10:29 AM  Follow-up for Phone Call        Dr Excell Seltzer is excellent. Young asnd well trained, very up to date. Follow-up by: Shaune Leeks MD,  Feb 11, 2009 10:59 AM  Additional Follow-up for Phone Call Additional follow up Details #1::        Advised pt.  He has follow up appt with that office in June. Additional Follow-up by: Lowella Petties,  Feb 11, 2009 12:12 PM

## 2010-11-11 NOTE — Assessment & Plan Note (Signed)
Summary: Cardiology Nuclear Study  Nuclear Med Background Indications for Stress Test: Evaluation for Ischemia, Stent Patency   History: Heart Catheterization, Myocardial Infarction, Myocardial Perfusion Study, Stents  History Comments: '04 MI>Heart Cat. AWMI> LAD stent '05 MPS- NL EF= 54%  Symptoms: Chest Pain with Exertion, DOE    Nuclear Pre-Procedure Cardiac Risk Factors: Family History - CAD, History of Smoking, Lipids, Obesity Caffeine/Decaff Intake: None NPO After: 6:30 PM Lungs: clear IV 0.9% NS with Angio Cath: 22g     IV Site: (R) Hand IV Started by: Stanton Kidney EMT-P Chest Size (in) 50     Height (in): 73 Weight (lb): 261 BMI: 34.56  Nuclear Med Study 1 or 2 day study:  1 day     Stress Test Type:  Stress Reading MD:  Arvilla Meres, MD     Referring MD:  Dayle Points Resting Radionuclide:  Technetium 81m Tetrofosmin     Resting Radionuclide Dose:  10.1 mCi  Stress Radionuclide:  Technetium 39m Tetrofosmin     Stress Radionuclide Dose:  30. mCi   Stress Protocol Exercise Time (min):  5:30 min     Max HR:  134 bpm     Predicted Max HR: 152 bpm  Max Systolic BP: 181 mm Hg     % Max HR:  88% %     METS: 7.0 Rate Pressure Product:  98119    Stress Test Technologist:  Irean Hong RN     Nuclear Technologist:  Domenic Polite CNMT  Rest Procedure  Myocardial perfusion imaging was performed at rest 45 minutes following the intraveneous administration of Myoview Technetium 16m Tetrofosmin.  Stress Procedure  The patient exercised for 5 minutes and 30 seconds.  The patient stopped due to DOE with O2 sat 92% immediately post exercise,/fatigue,and denied any chest pain.  There were nonspecific  ST-T wave changes,occ. Pvc/couplet. The patient had mild hypertension in recovery.  Myoview was injected at peak exercise and myocardial perfusion imaging was performed after a brief delay.  QPS Raw Data Images:  Normal; no motion artifact; normal heart/lung ratio. Stress  Images:  There is normal uptake in all areas. Rest Images:  Normal homogeneous uptake in all areas of the myocardium. Subtraction (SDS):  Normal Transient Ischemic Dilatation:  .96  (Normal <1.22)  Lung/Heart Ratio:  .44  (Normal <0.45)  Quantitative Gated Spect Images QGS EDV:  122 ml QGS ESV:  52 ml QGS EF:  58 % QGS cine images:  Normal  Findings Normal nuclear study      Overall Impression  Exercise Capacity: Fair exercise capacity. BP Response: Normal blood pressure response. Clinical Symptoms: There is dyspnea. No CP. ECG Impression: Insignificant upsloping ST segment depression. Overall Impression: Normal stress nuclear study.  Appended Document: Cardiology Nuclear Study Pt aware of results by phone.

## 2010-11-11 NOTE — Assessment & Plan Note (Signed)
Summary: EMR PILOT   Vital Signs:  Patient Profile:   70 Years Old Male Weight:      257 pounds Pulse rate:   100 / minute Pulse rhythm:   regular BP sitting:   120 / 62                 Visit Type:  Follow-up Visit PCP:  Hetty Ely  Chief Complaint:  follow-up duodenal ulcer.  History of Present Illness: Hospitalized in Dec 08 with GI bleed from duodenal ulcer.EGD 10/03/07. Got 4 u's PRC. No melenna, abdominal pain, bright red blood now. Had stopped his PPI.  Was treated x 2 for H. pylori in 2006 after duodenal ulcer. No follow-up since. Had Prev pak then tetracycline, pepto-bismol, metronidazole and PPI.   Current Allergies (reviewed today): ! ZOCOR MORPHINE CODEINE * Luis Sweeney  Past Medical History:    Reviewed history from 03/03/2007 and no changes required:       Diverticulosis, colon       Renal insufficiency       Elevated glucose- 104 (11/2006)       Myocardial infarction, hx of- s/p stent LAD        Congenital abnormality right arm                Past Surgical History:    Reviewed history from 10/17/2007 and no changes required:       Stress cardiolite- normal EF 66% (02/1998)       Echo- mild M.R., mild T.R. (02/07/2001)       [facility]- Mi Apex, 90% lesion Stented  EF 45%       Stress cardiolite- no change EF 54% (06/26/2004)       Abd [place]- normal (08/13/2004)       Colonoscopy- polyp, divertic (11/07/2004)         75yrs       [facility]- PUD, GI bleed.  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 pylori breath test to see if H. pylori is eradicated from 2006 treatment (will do after 2 mos on PPI, discuss at colonoscopy)  Problem # 2:  ACUTE POSTHEMORRHAGIC ANEMIA (ICD-285.1) Assessment: Comment Only recheck CBC, check Ferritin  Problem # 3:  PERSONAL HISTORY OF COLONIC POLYPS (ICD-V12.72) Assessment:  Unchanged schedule surveillance colonoscopy  Problem # 4:  Hx of CAD (ICD-414.00) Assessment: Unchanged  His updated medication list for this problem includes:    Altace 10 Mg Caps (Ramipril) .Marland Kitchen... Take one by mouth daily restart today, stay on PPI    Anacin 81 Mg Tbec (Aspirin) ..... Qd   Medications Added to Medication List This Visit: 1)  Anacin 81 Mg Tbec (Aspirin) .... Qd     ]

## 2010-11-11 NOTE — Assessment & Plan Note (Signed)
Summary: confirm eradication-sheri  Patient here for urea breath test, samples obtained per packeage instructions, mailed in preprinted envelope, will call with results when available.

## 2010-11-11 NOTE — Assessment & Plan Note (Signed)
Summary: discuss changing lipitor per patientinsurance/whc   Vital Signs:  Patient profile:   70 year old male Height:      72 inches Weight:      267 pounds Temp:     98.1 degrees F oral Pulse rate:   88 / minute Pulse rhythm:   regular BP sitting:   120 / 60  (left arm) Cuff size:   large  Vitals Entered By: Providence Crosby (December 27, 2008 10:15 AM)   History of Present Illness: Patient presented today becasue insurance company refuses to cover Lipitor.  Insurance company wants patient to try Simvastatin, Crestor, and Vytorin first.  Patient stated he has has tried other cholesterol medications in the past with negative side effects and Lipitor effectively controls cholesterol.  A letter has previously been sent to the insurance company but they still refuse to cover Lipitor.  Patient is still medicated and has about a 1 month's supply.  Patient also admits to being overweight.  He tries to watch the amount of food he eats though he eats too much late at night.  Drinks 2-3 caffeine drinks per day, no tobacco use, and 4 alcoholic drinks per year.  No exercise.  Allergies: 1)  ! Zocor 2)  Morphine 3)  Codeine 4)  * Maxequin  Physical Exam  General:  Well-developed,well-nourished,in no acute distress; alert,appropriate and cooperative throughout examination, obese. Head:  Normocephalic and atraumatic without obvious abnormalities. No apparent alopecia or balding. Eyes:  Conjunctiva clear bilaterally Ears:  External ear exam shows no significant lesions or deformities.  Otoscopic examination reveals clear canals, tympanic membranes are intact bilaterally without bulging, retraction, inflammation or discharge. Hearing is grossly normal bilaterally. Nose:  External nasal examination shows no deformity or inflammation. Nasal mucosa are pink and moist without lesions or exudates. Mouth:  Oral mucosa and oropharynx without lesions or exudates.  Teeth in good repair. Lungs:  Normal  respiratory effort, chest expands symmetrically. Lungs are clear to auscultation, no crackles or wheezes. Heart:  Normal rate and regular rhythm. S1 and S2 normal without gallop, murmur, click, rub or other extra sounds. Abdomen:  Bowel sounds positive,abdomen soft and non-tender without masses, organomegaly or hernias noted. Mildly protuberant.   Impression & Recommendations:  Problem # 1:  HYPERCHOLESTEROLEMIA (ICD-272.0) Assessment Unchanged Will switch to Crestor at lower dose and recheck in 10 weeks as she thinks he thinks he has approx 4 weeks of Lipitor left. His updated medication list for this problem includes:    Lipitor 20 Mg Tabs (Atorvastatin calcium) .Marland Kitchen... Take one by mouth daily    Crestor 10 Mg Tabs (Rosuvastatin calcium) ..... One tab by mouth at night  Labs Reviewed: Chol: 97 (01/04/2008)   HDL: 29.0 (01/04/2008)   LDL: 57 (01/04/2008)   TG: 57 (01/04/2008) SGOT: 21 (01/04/2008)   SGPT: 18 (01/04/2008)  Complete Medication List: 1)  Lipitor 20 Mg Tabs (Atorvastatin calcium) .... Take one by mouth daily 2)  Altace 10 Mg Caps (Ramipril) .... Take one by mouth daily takes 5mg  two times a day 3)  Bayer Aspirin Ec Low Dose 81 Mg Tbec (Aspirin) .Marland Kitchen.. 1 daily by mouth 4)  Crestor 10 Mg Tabs (Rosuvastatin calcium) .... One tab by mouth at night  Patient Instructions: 1)  SGOT, SGPT 272.0  10 WEEKS 2)  See me in 4mos, SGOT, SGPT, CHOL PROFILE  272.0     prior 3)  The medication list was reviewed and reconciled.  All changed / newly prescribed medications were explained.  A complete medication list was provided to the patient / caregiver. 4)  The medication list was reviewed and reconciled.  All changed / newly prescribed medications were explained.  A complete medication list was provided to the patient / caregiver. Prescriptions: CRESTOR 10 MG TABS (ROSUVASTATIN CALCIUM) one tab by mouth at night  #30 x 12   Entered and Authorized by:   Shaune Leeks MD   Signed by:    Shaune Leeks MD on 12/27/2008   Method used:   Electronically to        [facility] [address] [id number] (retail)       [address]       [place], [place]  [address]       Ph: [phone] or [phone]       Fax: [phone]   RxID:   [id number]

## 2010-11-11 NOTE — Progress Notes (Signed)
Summary: MED REFILL  Phone Note Call from Patient   Caller: Patient Summary of Call: PT HAS A SCRIPT THAT NEEDS TO BE REFILLED AND IT HAS EXPIRED. NEEDS TO KNOW IF HE NEEDS TO COME IN FIRST. RAMIPRIL & CRESTOR [phone] CELL  Initial call taken by: Edman Circle,  May 30, 2010 3:57 PM  Follow-up for Phone Call        I spoke with the pt and made him aware that he needs an appt with Dr Excell Seltzer in order for refills to be given.  I scheduled the pt to see Dr Excell Seltzer 07/08/10.  Refills given that will last until appt.    Follow-up by: Julieta Gutting, RN, BSN,  May 30, 2010 4:56 PM    New/Updated Medications: ALTACE 5 MG CAPS (RAMIPRIL) Take 1 capsule by mouth two times a day CRESTOR 10 MG TABS (ROSUVASTATIN CALCIUM) 1 daily by mouth Prescriptions: CRESTOR 10 MG TABS (ROSUVASTATIN CALCIUM) 1 daily by mouth  #30 x 1   Entered by:   Julieta Gutting, RN, BSN   Authorized by:   Norva Karvonen, MD   Signed by:   Julieta Gutting, RN, BSN on 05/30/2010   Method used:   [facility]ronically to        [facility] [address] [phone][facility] (retail)       [address]       [place], [place]  [address]       Ph: [phone]       Fax: [phone]   RxID:   [id number] ALTACE 5 MG CAPS (RAMIPRIL) Take 1 capsule by mouth two times a day  #60 x 1   Entered by:   Julieta Gutting, RN, BSN   Authorized by:   Norva Karvonen, MD   Signed by:   Julieta Gutting, RN, BSN on 05/30/2010   Method used:   [facility] to        [facility] [address] [phone]* (retail)       [address]       [place], [place]  [address]       Ph: [phone]       Fax: [phone]   RxID:   [id number]

## 2010-11-11 NOTE — Progress Notes (Signed)
Summary: REFILL  Phone Note Call from Patient Call back at [facility] Phone [phone] Call back at Work Phone [phone]   Caller: Patient Call For: Laruen Risser Reason for Call: Talk to Nurse Summary of Call: PT WANTS TO KNOW IF HE CAN GET REFILLS ON HIS PROTONIX UNTIL HIS APP DATE 12-16 Initial call taken by: Tawni Levy,  August 28, 2008 11:58 AM  Follow-up for Phone Call        Called pt to request pharm.  Refill x 2 sent to pharm. Follow-up by: Francee Piccolo CMA,  August 29, 2008 3:57 PM      Prescriptions: PROTONIX 40 MG  TBEC (PANTOPRAZOLE SODIUM) Take 1 tablet by mouth twice a day  #60 x 2   Entered by:   Francee Piccolo CMA   Authorized by:   Iva Boop MD   Signed by:   Francee Piccolo CMA on 08/29/2008   Method used:   [facility]        [facility] [address] [phone]* (retail)       [address]       [place], [place]  [address]       Ph: [phone] or [phone]       Fax: [phone]   RxID:   [id number]

## 2010-11-11 NOTE — Assessment & Plan Note (Signed)
Summary: CONGESTION,COUGH/CLE   Vital Signs:  Patient Profile:   70 Years Old Male Weight:      260.13 pounds O2 Sat:      96 % O2 treatment:    Room Air Temp:     98.8 degrees F oral Pulse rate:   92 / minute Pulse rhythm:   regular Resp:     20 per minute BP sitting:   152 / 64  (left arm) Cuff size:   large  Vitals Entered By: Delilah Shan (November 04, 2007 4:19 PM)                 Chief Complaint:  Cough and congestion.  Acute Visit History:      The patient complains of cough, headache, and nasal discharge.  These symptoms began 2 days ago.  He denies fever.  Other comments include: chest congestion  improved 100% from last bronchitis in 12/08.        The patient notes wheezing.  The character of the cough is described as productive.  There is no history of shortness of breath associated with his cough.        Current Allergies (reviewed today): ! ZOCOR MORPHINE CODEINE * MAXEQUIN  Past Medical History:    Reviewed history from 03/03/2007 and no changes required:       Diverticulosis, colon       Renal insufficiency       Elevated glucose- 104 (11/2006)       Myocardial infarction, hx of- s/p stent LAD    Family History:    Reviewed history from 03/03/2007 and no changes required:       Father: CHF, DM, kidney failure- died age 21       Mother: pneumonia, died age 59       Siblings: 4 sisters  Social History:    Reviewed history from 09/21/2007 and no changes required:       Marital Status: Married lives w/ wife   3children        Occupation: [employer]   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 EOMI. Perrla. Funduscopic exam benign, without hemorrhages, exudates or papilledema. Vision grossly normal. Ears:     External ear exam shows no significant lesions or deformities.  Otoscopic examination  reveals clear canals, tympanic membranes are intact bilaterally without bulging, retraction, inflammation or discharge. Hearing is grossly normal bilaterally. Nose:     External nasal examination shows no deformity or inflammation. Nasal mucosa are pink and moist without lesions or exudates. Mouth:     Oral mucosa and oropharynx without lesions or exudates.  Teeth in good repair. Lungs:     occ exp wheeze, no rales aor rhonchi, no dullness and no fremitus.   Heart:     Normal rate and regular rhythm. S1 and S2 normal without gallop, murmur, click, rub or other extra sounds. Cervical Nodes:     No lymphadenopathy noted    Impression & Recommendations:  Problem # 1:  BRONCHITIS-ACUTE (ICD-466.0)  The following medications were removed from the medication list:    Zithromax Z-pak 250 Mg Tabs (Azithromycin) .Marland Kitchen... As directed  His updated medication list for this problem includes:    Azithromycin 250 Mg Tabs (Azithromycin) .Marland Kitchen... 2 tab by  mouth x 1 day, then 1 tab by mouth daily  Take antibiotics and other medications as directed. Encouraged to push clear liquids, get enough rest, and take acetaminophen as needed. To be seen in 5-7 days if no improvement, sooner if worse.  If frequent bronchitis continues, follow up with primary MD for further work up.   Complete Medication List: 1)  Lipitor 20 Mg Tabs (Atorvastatin calcium) .... Take one by mouth daily 2)  Altace 10 Mg Caps (Ramipril) .... Take one by mouth daily 3)  Protonix 40 Mg Tbec (Pantoprazole sodium) .... Take 1 tablet by mouth once a day 4)  Azithromycin 250 Mg Tabs (Azithromycin) .... 2 tab by mouth x 1 day, then 1 tab by mouth daily    Patient Instructions: 1)  Call if not improving . 2)   Go to ER if shortness of breath.    Prescriptions: AZITHROMYCIN 250 MG  TABS (AZITHROMYCIN) 2 tab by mouth x 1 day, then 1 tab by mouth daily  #6 x 0   Entered and Authorized by:   Kerby Nora MD   Signed by:   Kerby Nora MD on  11/04/2007   Method used:   Print then Give to Patient   RxID:   [id number]  ] Current Allergies (reviewed today): ! ZOCOR MORPHINE CODEINE * MAXEQUIN Current Medications (including changes made in today's visit):  LIPITOR 20 MG TABS (ATORVASTATIN CALCIUM) take one by mouth daily ALTACE 10 MG CAPS (RAMIPRIL) take one by mouth daily PROTONIX 40 MG  TBEC (PANTOPRAZOLE SODIUM) Take 1 tablet by mouth once a day AZITHROMYCIN 250 MG  TABS (AZITHROMYCIN) 2 tab by mouth x 1 day, then 1 tab by mouth daily

## 2010-11-11 NOTE — Procedures (Signed)
Summary: [facility]/EGD Report/Dr. Eda Paschal [facility]/EGD Report/Dr. Arlyce Dice   Imported By: Eleonore Chiquito 10/17/2007 16:04:09  _____________________________________________________________________  External Attachment:    Type:   Image     Comment:   External Document  Appended Document: [facility]rc[facility]/EGD Report/Dr. Arlyce Dice      Current Allergies: ! ZOCOR MORPHINE CODEINE * MAXEQUIN  Past Surgical History:    Stress cardiolite- normal EF 66% (02/1998)    Echo- mild M.R., mild T.R. (02/07/2001)    [facility]- Mi Apex, 90% lesion Stented  EF 45%    Stress cardiolite- no change EF 54% (06/26/2004)    Abd [place]- normal (08/13/2004)    Colonoscopy- polyp, divertic (11/07/2004)         48yrs    [facility]- PUD, GI bleed.  Off plavix, ASA x 2 weeks (08/16-08/18/2007)    EGD- Duodenitis w/o hemorrhage (05/27/2001)    Inguinal herniorrhaphy- right (02/18/2007)    EGD Bleeding ulcer in Duod bulb, fulgurated  10/03/07        Complete Medication List: 1)  Lipitor 20 Mg Tabs (Atorvastatin calcium) .... Take one by mouth daily 2)  Altace 10 Mg Caps (Ramipril) .... Take one by mouth daily 3)  Aspir-low 81 Mg Tbec (Aspirin) .Marland Kitchen.. 1 qd 4)  Zithromax Z-pak 250 Mg Tabs (Azithromycin) .... As directed     ]

## 2010-11-11 NOTE — Assessment & Plan Note (Signed)
Summary: CPX / LFW   Vital Signs:  Patient profile:   70 year old male Height:      73 inches Weight:      262 pounds Temp:     98.3 degrees F oral Pulse rate:   68 / minute Pulse rhythm:   regular BP sitting:   120 / 60  (left arm) Cuff size:   large  Vitals Entered By: Providence Crosby LPN (June 13, 2009 8:31 AM)  History of Present Illness: Pt here for Comp Exam, doing well. He recently had Nuclear stress study which was nml. He feels well, no complaints.   Preventive Screening-Counseling & Management  Alcohol-Tobacco     Alcohol drinks/day: 0     Alcohol type: beer 3x/yr     Smoking Status: quit     Year Quit: 1986     Pack years: 25     Passive Smoke Exposure: no  Caffeine-Diet-Exercise     Caffeine use/day: 2     Does Patient Exercise: no     Exercise (avg: min/session): 5:30  Problems Prior to Update: 1)  Other Dyspnea and Respiratory Abnormalities  (ICD-786.09) 2)  Cad  (ICD-414.00) 3)  Renal Insufficiency  (ICD-588.9) 4)  Hypercholesterolemia  (ICD-272.0) 5)  Urticaria, Localized Lat Left Arm  (ICD-708.9) 6)  Cephalgia  (ICD-784.0) 7)  Health Maintenance Exam  (ICD-V70.0) 8)  Anemia, Iron Deficiency  (ICD-280.9) 9)  Special Screening Malignant Neoplasm of Prostate  (ICD-V76.44) 10)  Helicobacter Pylori Gastritis, Recurrent (NEEDS PPI FOREVER)  (ICD-041.86) 11)  Personal History of Colonic Polyps  (ICD-V12.72) 12)  Acute Posthemorrhagic Anemia  (ICD-285.1) 13)  Duodenal Ulcer, Acute, Hemorrhage, Without Obstruction  (ICD-532.00) 14)  Gerd  (ICD-530.81) 15)  Hyperglycemia  (ICD-790.6) 16)  Myocardial Infarction, Hx of  (ICD-412) 17)  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 1 Daily By Mouth 2)  Crestor 20 Mg Tabs (Rosuvastatin Calcium) .... Take One Tablet By Mouth Daily. 3)  Altace 5 Mg Caps (Ramipril) .... Take 1 Capsule By Mouth Two  Times A Day  Allergies: 1)  ! Zocor 2)  Morphine 3)  Codeine 4)  * Maxequin  Past History:  Past Medical History: Last updated: 03/13/2009 CAD (ICD-414.00)-treated with drug-eluting stent placement to the proximal left anterior   descending in late 2004.  RENAL INSUFFICIENCY (ICD-588.9) HYPERCHOLESTEROLEMIA (ICD-272.0) URTICARIA, LOCALIZED LAT LEFT ARM (ICD-708.9) CEPHALGIA (ICD-784.0) HEALTH MAINTENANCE EXAM (ICD-V70.0) ANEMIA, IRON DEFICIENCY (ICD-280.9) SPECIAL SCREENING MALIGNANT NEOPLASM OF PROSTATE (ICD-V76.44) HELICOBACTER PYLORI GASTRITIS, RECURRENT (NEEDS PPI FOREVER) (ICD-041.86) PERSONAL HISTORY OF COLONIC POLYPS (ICD-V12.72) ACUTE POSTHEMORRHAGIC ANEMIA (ICD-285.1) DUODENAL ULCER, ACUTE, HEMORRHAGE, WITHOUT OBSTRUCTION (ICD-532.00) GERD (ICD-530.81) HYPERGLYCEMIA (ICD-790.6) MYOCARDIAL INFARCTION, HX OF (ICD-412) Hx of ERB'S PALSY (ICD-767.6) DIVERTICULOSIS, COLON (ICD-562.10)  Past Surgical History: Last updated: 03/13/2009 Rt. shoulder Rt. wrist Spinal cyst removal Left knee surgery  drug-eluting stent placement  Right inguinal herniorrhaphy   Family History: Last updated: 06/13/2009 Father: dec CHF, DM, kidney failure- died age 82 Mother:dec  pneumonia, died age 98 Sister A 32  HTn Dm Sister A 66  Htn Arthritis Sister A 53 Sister A 88 No FH of Colon Cancer:  Social History: Last updated: 03/13/2009 Marital Status: Married lives w/ wife   3children  Occupation: [employer]   Metal Fabrication/ Immunologist Former Smoker Illicit Drug Use - no Alcohol Use - no  Risk Factors:  Alcohol Use: 0 (06/13/2009) Caffeine Use: 2 (06/13/2009) Exercise: no (06/13/2009)  Risk Factors: Smoking Status: quit (06/13/2009) Passive Smoke Exposure: no (06/13/2009)  Family History: Father: dec CHF, DM, kidney failure- died age 32 Mother:dec  pneumonia, died age 39 Sister A 75  HTn Dm Sister A 99  Htn Arthritis Sister A 37 Sister A 63  No FH of Colon Cancer:  Review of Systems General:  Denies chills, fatigue, fever, loss of appetite, malaise, sleep disorder, sweats, weakness, and weight loss. Eyes:  Denies blurring, discharge, double vision, eye irritation, eye pain, halos, itching, light sensitivity, red eye, vision loss-1 eye, and vision loss-both eyes; recent exam.. ENT:  Denies decreased hearing, difficulty swallowing, ear discharge, earache, hoarseness, nasal congestion, nosebleeds, postnasal drainage, ringing in ears, sinus pressure, and sore throat. CV:  Denies bluish discoloration of lips or nails, chest pain or discomfort, difficulty breathing at night, difficulty breathing while lying down, fainting, fatigue, leg cramps with exertion, lightheadness, near fainting, palpitations, shortness of breath with exertion, swelling of feet, swelling of hands, and weight gain; occas twinge in the chest.. Resp:  Denies chest discomfort, chest pain with inspiration, cough, coughing up blood, excessive snoring, hypersomnolence, morning headaches, pleuritic, shortness of breath, sputum productive, and wheezing. GI:  Complains of constipation; denies abdominal pain, bloody stools, change in bowel habits, dark tarry stools, diarrhea, excessive appetite, gas, hemorrhoids, indigestion, loss of appetite, nausea, vomiting, vomiting blood, and yellowish skin color; rare constip, occas cough fromm reflux.. GU:  Complains of nocturia; denies decreased libido, discharge, dysuria, erectile dysfunction, genital sores, hematuria, incontinence, urinary frequency, and urinary hesitancy; once to twice. MS:  Complains of joint pain; denies joint redness, joint swelling, loss of strength, low back pain, mid back pain, muscle aches, muscle , cramps, muscle weakness, stiffness, and thoracic pain; various at times.. Derm:  Complains of dryness; denies changes in color of skin, changes in nail beds, excessive perspiration, flushing, hair loss, insect bite(s),  itching, lesion(s), poor wound healing, and rash; hand dryness at times.. Neuro:  Denies brief paralysis, difficulty with concentration, disturbances in coordination, falling down, headaches, inability to speak, memory loss, numbness, poor balance, seizures, sensation of room spinning, tingling, tremors, visual disturbances, and weakness.  Physical Exam  General:  Well-developed,well-nourished,in no acute distress; alert,appropriate and cooperative throughout examination, obese. Head:  Normocephalic and atraumatic without obvious abnormalities. No apparent alopecia or balding. Eyes:  Conjunctiva clear bilaterally Ears:  External ear exam shows no significant lesions or deformities.  Otoscopic examination reveals clear canals, tympanic membranes are intact bilaterally without bulging, retraction, inflammation or discharge. Hearing is grossly normal bilaterally. Cerumen bilat. Nose:  External nasal examination shows no deformity or inflammation. Nasal mucosa are pink and moist without lesions or exudates. Mouth:  Oral mucosa and oropharynx without lesions or exudates.  Teeth in good repair. Neck:  No deformities, masses, or tenderness noted. Chest Wall:  No deformities, masses, tenderness or gynecomastia noted. Breasts:  No masses or gynecomastia noted Lungs:  Normal respiratory effort, chest expands symmetrically. Lungs are clear to auscultation, no crackles or wheezes. Heart:  Normal rate and regular rhythm. S1 and S2 normal without gallop, murmur, click, rub or other extra sounds. Abdomen:  Bowel sounds positive,abdomen soft and non-tender without masses, organomegaly or hernias noted. Mildly protuberant. Rectal:  No external abnormalities noted. Normal sphincter tone. No rectal masses or tenderness. G neg. Genitalia:  Testes bilaterally descended without nodularity, tenderness or masses. No scrotal masses or lesions. No penis lesions or urethral discharge. Prostate:  Prostate  gland firm and  smooth, no enlargement, nodularity, tenderness, mass, asymmetry or induration. 20-30gms Msk:  No deformity or scoliosis noted of thoracic or lumbar spine.   Pulses:  R and L carotid,radial,femoral,dorsalis pedis and posterior tibial pulses are full and equal bilaterally Extremities:  No clubbing, cyanosis, edema, or deformity noted with normal full range of motion of all joints.  Atretic, constantly flexed R arm and hand. Neurologic:  No cranial nerve deficits noted. Station and gait are normal. Plantar reflexes are down-going bilaterally. DTRs are symmetrical throughout. Sensory, motor and coordinative functions appear intact. Skin:  Intact without suspicious lesions or rashes Cervical Nodes:  No lymphadenopathy noted Inguinal Nodes:  No significant adenopathy Psych:  Cognition and judgment appear intact. Alert and cooperative with normal attention span and concentration. No apparent delusions, illusions, hallucinations  Diabetes Management Exam:    Eye Exam:       Eye Exam done elsewhere          Date: 03/27/2009          Results: normal          Done by: Dr Dingledein   Impression & Recommendations:  Problem # 1:  HEALTH MAINTENANCE EXAM (ICD-V70.0) Assessment Comment Only  Problem # 2:  CAD (ICD-414.00) Assessment: Unchanged  Stable His updated medication list for this problem includes:    Bayer Aspirin Ec Low Dose 81 Mg Tbec (Aspirin) .Marland Kitchen... 1 daily by mouth    Altace 5 Mg Caps (Ramipril) .Marland Kitchen... Take 1 capsule by mouth two times a day  Labs Reviewed: Chol: 91 (06/10/2009)   HDL: 32.50 (06/10/2009)   LDL: 46 (06/10/2009)   TG: 63.0 (06/10/2009)  Problem # 3:  RENAL INSUFFICIENCY (ICD-588.9) Assessment: Unchanged Stable.  Problem # 4:  HYPERCHOLESTEROLEMIA (ICD-272.0) Assessment: Improved  Great nos. Cont Crestor 10 The following medications were removed from the medication list:    Crestor 20 Mg Tabs (Rosuvastatin calcium) .Marland Kitchen... Take one tablet by mouth daily. His  updated medication list for this problem includes:    Crestor 10 Mg Tabs (Rosuvastatin calcium) .Marland Kitchen... 1 daily by mouth  Labs Reviewed: SGOT: 25 (06/10/2009)   SGPT: 18 (06/10/2009)   HDL:32.50 (06/10/2009), 29.0 (01/04/2008)  LDL:46 (06/10/2009), 57 (01/04/2008)  Chol:91 (06/10/2009), 97 (01/04/2008)  Trig:63.0 (06/10/2009), 57 (01/04/2008)  Problem # 5:  ANEMIA, IRON DEFICIENCY (ICD-280.9) Assessment: Improved  Nml now.  Hgb: 14.1 (06/10/2009)   Hct: 42.1 (06/10/2009)   Platelets: 196.0 (06/10/2009) RBC: 4.51 (06/10/2009)   RDW: 13.5 (06/10/2009)   WBC: 5.8 (06/10/2009) MCV: 93.2 (06/10/2009)   MCHC: 33.5 (06/10/2009) Ferritin: 14.7 (01/04/2008) Iron: 68 (06/10/2009)   B12: 235 (06/10/2009)   TSH: 2.19 (01/04/2008)  Problem # 6:  SPECIAL SCREENING MALIGNANT NEOPLASM OF PROSTATE (ICD-V76.44) Assessment: Unchanged Stable Exam and PSA.  Problem # 7:  GERD (ICD-530.81) Assessment: Unchanged Stable.  Problem # 8:  HYPERGLYCEMIA (ICD-790.6) Assessment: Improved Euglycemioc today, cont watching intake.  Problem # 9:  DIVERTICULOSIS, COLON (ICD-562.10) Assessment: Unchanged Discussed being seen for prolonged LLQ discomfort.  Complete Medication List: 1)  Bayer Aspirin Ec Low Dose 81 Mg Tbec (Aspirin) .Marland Kitchen.. 1 daily by mouth 2)  Altace 5 Mg Caps (Ramipril) .... Take 1 capsule by mouth two times a day 3)  Crestor 10 Mg Tabs (Rosuvastatin calcium) .Marland Kitchen.. 1 daily by mouth  Patient Instructions: 1)  RTC as needed.

## 2010-11-11 NOTE — Miscellaneous (Signed)
Summary: ramipril refill  Clinical Lists Changes  Medications: Changed medication from ALTACE 5 MG CAPS (RAMIPRIL) Take 1 capsule by mouth two times a day to ALTACE 5 MG CAPS (RAMIPRIL) Take 1 capsule by mouth two times a day - Signed Rx of ALTACE 5 MG CAPS (RAMIPRIL) Take 1 capsule by mouth two times a day;  #60 x 11;  Signed;  Entered by: Teressa Lower RN;  Authorized by: Loreli Slot, MD, [facility];  Method used: [id number] [address] [place], [place], [place]  [address], Ph: [phone], Fax: [phone]    Prescriptions: ALTACE 5 MG CAPS (RAMIPRIL) Take 1 capsule by mouth two times a day  #60 x 11   Entered by:   Teressa Lower RN   Authorized by:   Loreli Slot, MD, [facility]   Signed by:   Teressa Lower RN on 05/29/2009   Method used:   [facility]        [facility]  [address] [id number]       [address]       [place], [place]  [address]       Ph: [phone]       Fax: [phone]   RxID:   [id number]

## 2010-11-11 NOTE — Miscellaneous (Signed)
  Clinical Lists Changes  Problems: Added new problem of HELICOBACTER PYLORI GASTRITIS, RECURRENT (NEEDS PPI FOREVER) (ICD-041.86) Observations: Added new observation of PAST SURG HX: Stress cardiolite- normal EF 66% (02/1998) Echo- mild M.R., mild T.R. (02/07/2001) [facility]- Mi Apex, 90% lesion Stented  EF 45% Stress cardiolite- no change EF 54% (06/26/2004) Abd [place]- normal (08/13/2004) Colonoscopy- polyp, divertic (11/07/2004)         75yrs [facility]- PUD, GI bleed.  Off plavix, ASA x 2 weeks (08/16-08/18/2007) EGD- Duodenitis w/o hemorrhage (05/27/2001) Inguinal herniorrhaphy- right (02/18/2007) EGD Bleeding ulcer in Duod bulb, fulgurated  10/03/07 Colonoscopy Divertics Int Hemms No polyps but previously (Dr Leone Payor) (2/2 08) Recurrent H. Pylori needs PPI forever (12/21/2007) (12/25/2007 19:24)       Past Surgical History:    Stress cardiolite- normal EF 66% (02/1998)    Echo- mild M.R., mild T.R. (02/07/2001)    [facility]- Mi Apex, 90% lesion Stented  EF 45%    Stress cardiolite- no change EF 54% (06/26/2004)    Abd [place]- normal (08/13/2004)    Colonoscopy- polyp, divertic (11/07/2004)         83yrs    [facility]- PUD, GI bleed.  Off plavix, ASA x 2 weeks (08/16-08/18/2007)    EGD- Duodenitis w/o hemorrhage (05/27/2001)    Inguinal herniorrhaphy- right (02/18/2007)    EGD Bleeding ulcer in Duod bulb, fulgurated  10/03/07    Colonoscopy Divertics Int Hemms No polyps but previously (Dr Leone Payor) (2/2 08)    Recurrent H. Pylori needs PPI forever (12/21/2007)

## 2010-11-11 NOTE — Progress Notes (Signed)
Summary: Nuclear Pre-Procedure  Phone Note Outgoing Call Call back at [facility] Phone [phone]   Call placed by: Stanton Kidney, EMT-P,  March 26, 2009 10:54 AM Summary of Call: Left message with information on Myoview Information Sheet (see scanned document for details).        Nuclear Med Background Indications for Stress Test: Evaluation for Ischemia, Stent Patency   History: Heart Catheterization, Myocardial Infarction, Myocardial Perfusion Study, Stents  History Comments: '04 MI>Heart Cat. AWMI> LAD stent '05 MPS- NL EF= 54%     Nuclear Pre-Procedure Cardiac Risk Factors: Family History - CAD, History of Smoking, Lipids, Obesity Height (in): 74

## 2010-11-11 NOTE — Assessment & Plan Note (Signed)
Summary: 6 MONTH FOLLOW UP/[facility]   Vital Signs:  Patient Profile:   70 Years Old Male Height:     72 inches Weight:      260 pounds Temp:     98.4 degrees F oral Pulse rate:   76 / minute Pulse rhythm:   regular BP sitting:   120 / 70  (left arm) Cuff size:   large  Vitals Entered By: Providence Crosby (July 10, 2008 9:08 AM)                 PCP:  Hetty Ely  Chief Complaint:  6 month followup.  History of Present Illness: Here for 6 month followup, treated for H.pylori ulcefr and now on lifelong Protonix without pain. Was also seen by Cardiology and checked over.  Gaylyn Rong never broke out with shingles but had significant pain in left ear and took Acyclovir for presumed infection. He was also given Ab. He had fluid in ear.  No complaints except aging process!    Prior Medications Reviewed Using: Patient Recall  Current Allergies (reviewed today): ! ZOCOR MORPHINE CODEINE * MAXEQUIN      Physical Exam  General:     Well-developed,well-nourished,in no acute distress; alert,appropriate and cooperative throughout examination, mildly obese. Head:     Normocephalic and atraumatic without obvious abnormalities. No apparent alopecia or balding. Eyes:     Conjunctiva clear bilaterally.  Ears:     External ear exam shows no significant lesions or deformities.  Otoscopic examination reveals clear canals, tympanic membranes are intact bilaterally without bulging, retraction, inflammation or discharge. Hearing is grossly normal bilaterally. Nose:     External nasal examination shows no deformity or inflammation. Nasal mucosa are pink and moist without lesions or exudates. Mouth:     Oral mucosa and oropharynx without lesions or exudates.  Teeth in good repair. Neck:     No deformities, masses, or tenderness noted. Chest Wall:     No deformities, masses, tenderness or gynecomastia noted. Lungs:     Normal respiratory effort, chest expands symmetrically. Lungs are clear to  auscultation, no crackles or wheezes. Heart:     Normal rate and regular rhythm. S1 and S2 normal without gallop, murmur, click, rub or other extra sounds. Abdomen:     Bowel sounds positive,abdomen soft and non-tender without masses, organomegaly or hernias noted.    Impression & Recommendations:  Problem # 1:  CEPHALGIA (ICD-784.0) Assessment: Improved Resolved after combo of Ab and Acyclovir. His updated medication list for this problem includes:    Anacin 81 Mg Tbec (Aspirin) .Marland Kitchen... Take 1 tablet by mouth once a day   Problem # 2:  ANEMIA, IRON DEFICIENCY (ICD-280.9) Assessment: Improved Stable. Stay off augmentation and recheck next appt.Hgb: 14.1 (07/06/2008)   Hct: 43.7 (07/06/2008)   RDW: 14.7 (07/06/2008)   MCV: 90.8 (07/06/2008)   MCHC: 32.2 (07/06/2008) Ferritin: 14.7 (01/04/2008) Iron: 68 (07/06/2008)   B12: 330 (07/06/2008)   TSH: 2.19 (01/04/2008)   Problem # 3:  HELICOBACTER PYLORI GASTRITIS, RECURRENT (NEEDS PPI FOREVER) (ICD-041.86) Assessment: Improved  Problem # 4:  DUODENAL ULCER, ACUTE, HEMORRHAGE, WITHOUT OBSTRUCTION (ICD-532.00) Assessment: Improved Cont Protonix forever. His updated medication list for this problem includes:    Protonix 40 Mg Tbec (Pantoprazole sodium) .Marland Kitchen... Take 1 tablet by mouth twice a day  Labs Reviewed: Hgb: 14.1 (07/06/2008)   Hct: 43.7 (07/06/2008)   Hemoccult: Negative (01/03/2007)      Complete Medication List: 1)  Lipitor 20 Mg Tabs (Atorvastatin calcium) .... Take  one by mouth daily 2)  Altace 10 Mg Caps (Ramipril) .... Take one by mouth daily 3)  Protonix 40 Mg Tbec (Pantoprazole sodium) .... Take 1 tablet by mouth twice a day 4)  Anacin 81 Mg Tbec (Aspirin) .... Take 1 tablet by mouth once a day  Other Orders: Influenza Vaccine MCR (04540)   Patient Instructions: 1)  RTC 1 year for Comp Exam, labs prior 2)  Check B12 and Fe next time. 3)  35 mins spent on appt.   ]  Influenza Vaccine    Vaccine Type: Fluvax  MCR    Site: left deltoid    Mfr: GlaxoSmithKline    Dose: 0.5 ml    Route: IM    Given by: Providence Crosby    Exp. Date: 04/10/2009    Lot #: [id number]    VIS given: 05/05/07 version given July 10, 2008.  Flu Vaccine Consent Questions    Do you have a history of severe allergic reactions to this vaccine? no    Any prior history of allergic reactions to egg and/or gelatin? no    Do you have a sensitivity to the preservative Thimersol? no    Do you have a past history of Guillan-Barre Syndrome? no    Do you currently have an acute febrile illness? no    Have you ever had a severe reaction to latex? no    Vaccine information given and explained to patient? yes    Appended Document: 6 MONTH FOLLOW UP/[facility]        Current Allergies: ! ZOCOR MORPHINE CODEINE * MAXEQUIN        Complete Medication List: 1)  Lipitor 20 Mg Tabs (Atorvastatin calcium) .... Take one by mouth daily 2)  Altace 10 Mg Caps (Ramipril) .... Take one by mouth daily 3)  Protonix 40 Mg Tbec (Pantoprazole sodium) .... Take 1 tablet by mouth twice a day 4)  Anacin 81 Mg Tbec (Aspirin) .... Take 1 tablet by mouth once a day    ]  Pneumovax    Vaccine Type: Pneumovax (Medicare)    Site: right deltoid    Mfr: Merck    Dose: 0.5 ml    Route: IM    Given by: Providence Crosby    Exp. Date: 04/13/2009    Lot #: [id number]    VIS given: 05/09/96 version given July 10, 2008.

## 2010-11-11 NOTE — Assessment & Plan Note (Signed)
Summary: 1 YR  Medications Added BAYER ASPIRIN EC LOW DOSE 81 MG TBEC (ASPIRIN) 1 daily by mouth CRESTOR 20 MG TABS (ROSUVASTATIN CALCIUM) Take one tablet by mouth daily. ALTACE 5 MG CAPS (RAMIPRIL) Take 1 capsule by mouth two times a day        Visit Type:  Follow-up Primary Provider:  Joycelyn Man  CC:  No complaints.  History of Present Illness: This is a 70 year-old gentleman sho has been followed by Dr's Samule Ohm and Diona Browner since he presented with an anterior MI in 2004. He was treated with a drug-eluting stent in the LAD and had full recovery of his LV function.   He has done from a symptomatic standpoint since that time. He recently has developed progressive dyspnea on exertion which he attributes to weight gain. He denies chest pain or pressure. Denies orthopnea, PND, or edema. He is not engaged in any regular exercise.  Current Medications (verified): 1)  Bayer Aspirin Ec Low Dose 81 Mg Tbec (Aspirin) .Marland Kitchen.. 1 Daily By Mouth 2)  Crestor 20 Mg Tabs (Rosuvastatin Calcium) .... Take One Tablet By Mouth Daily. 3)  Altace 5 Mg Caps (Ramipril) .... Take 1 Capsule By Mouth Two Times A Day  Allergies: 1)  ! Zocor 2)  Morphine 3)  Codeine 4)  * Maxequin  Past History:  Past medical, surgical, family and social histories (including risk factors) reviewed, and no changes noted (except as noted below).  Past Medical History: Reviewed history from 03/13/2009 and no changes required. CAD (ICD-414.00)-treated with drug-eluting stent placement to the proximal left anterior   descending in late 2004.  RENAL INSUFFICIENCY (ICD-588.9) HYPERCHOLESTEROLEMIA (ICD-272.0) URTICARIA, LOCALIZED LAT LEFT ARM (ICD-708.9) CEPHALGIA (ICD-784.0) HEALTH MAINTENANCE EXAM (ICD-V70.0) ANEMIA, IRON DEFICIENCY (ICD-280.9) SPECIAL SCREENING MALIGNANT NEOPLASM OF PROSTATE (ICD-V76.44) HELICOBACTER PYLORI GASTRITIS, RECURRENT (NEEDS PPI FOREVER) (ICD-041.86) PERSONAL HISTORY OF COLONIC POLYPS  (ICD-V12.72) ACUTE POSTHEMORRHAGIC ANEMIA (ICD-285.1) DUODENAL ULCER, ACUTE, HEMORRHAGE, WITHOUT OBSTRUCTION (ICD-532.00) GERD (ICD-530.81) HYPERGLYCEMIA (ICD-790.6) MYOCARDIAL INFARCTION, HX OF (ICD-412) Hx of ERB'S PALSY (ICD-767.6) DIVERTICULOSIS, COLON (ICD-562.10)  Past Surgical History: Reviewed history from 03/13/2009 and no changes required. Rt. shoulder Rt. wrist Spinal cyst removal Left knee surgery  drug-eluting stent placement  Right inguinal herniorrhaphy   Family History: Reviewed history from 03/13/2009 and no changes required. Father: dec CHF, DM, kidney failure- died age 5 Mother:dec  pneumonia, died age 56 Sister A 56 HTn Dm Sister A 29  Htn Arthritis Sister A 46 Sister A 634 No FH of Colon Cancer:  Social History: Reviewed history from 03/13/2009 and no changes required. 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 HEENT: normal Neck: normal carotid upstrokes without bruits, JVP normal Lungs: CTA CV: RRR without  murmur or gallop Abd: soft, NT, positive BS, no bruit, no organomegaly Ext: no clubbing, cyanosis, or edema. peripheral pulses 2+ and equal Skin: warm and dry without rash    EKG  Procedure date:  03/14/2009  Findings:      NSR, within normal limits  Impression & Recommendations:  Problem # 1:  CAD (ICD-414.00) Pt with prior MI and LAD stenting.  In setting of new exertional dyspnea, recommend exercise stress Myoview to rule out ischemia. He has a good prognosis in setting normal LV function and normal EKG following his MI. Continue ASA, Statin, and ACE-I. He is intolerant to B-blockade secondary to fatigue.  If stress test documents no ischemia, will recommend an agressive exercise program with goal towards significant weight loss.  The following medications were removed from the medication list:    Altace 10 Mg Caps (Ramipril) .Marland Kitchen... Take one by mouth daily takes 5mg  two times a day His updated medication list for this problem includes:    Bayer Aspirin Ec Low Dose 81 Mg Tbec (Aspirin) .Marland Kitchen... 1 daily by mouth    Altace 5 Mg Caps (Ramipril) .Marland Kitchen... Take 1 capsule by mouth two times a day  Orders: EKG w/ Interpretation (93000) Nuclear Stress Test (Nuc Stress Test)  Problem # 2:  HYPERCHOLESTEROLEMIA (ICD-272.0) followed by Dr Hetty Ely The following medications were removed from the medication list:    Lipitor 20 Mg Tabs (Atorvastatin calcium) .Marland Kitchen... Take one by mouth daily    Crestor 10 Mg Tabs (Rosuvastatin calcium) ..... One tab by mouth at night His updated medication list for this problem includes:    Crestor 20 Mg Tabs (Rosuvastatin calcium) .Marland Kitchen... Take one tablet by mouth daily.  CHOL: 97 (01/04/2008)   LDL: 57 (01/04/2008)   HDL: 29.0 (01/04/2008)   TG: 57 (01/04/2008)  Patient Instructions: 1)  Your physician recommends that you schedule a follow-up appointment in: 1 year 2)  Your physician has requested that you have an exercise stress myoview.  For further information please visit [email or web address]  Please follow instruction sheet, as given.

## 2010-11-11 NOTE — Procedures (Signed)
Summary: Colonoscopy  Colonoscopy   Imported By: Beau Fanny 11/21/2007 13:51:27  _____________________________________________________________________  External Attachment:    Type:   Image     Comment:   External Document  Appended Document: Colonoscopy      Current Allergies: ! ZOCOR MORPHINE CODEINE * MAXEQUIN  Past Surgical History:    Stress cardiolite- normal EF 66% (02/1998)    Echo- mild M.R., mild T.R. (02/07/2001)    MCH- Mi Apex, 90% lesion Stented  EF 45%    Stress cardiolite- no change EF 54% (06/26/2004)    Abd [place]- normal (08/13/2004)    Colonoscopy- polyp, divertic (11/07/2004)         39yrs    [facility]- PUD, GI bleed.  Off plavix, ASA x 2 weeks (08/16-08/18/2007)    EGD- Duodenitis w/o hemorrhage (05/27/2001)    Inguinal herniorrhaphy- right (02/18/2007)    EGD Bleeding ulcer in Duod bulb, fulgurated  10/03/07    Colonoscopy Divwertics Int Hemms No polyps but previously (Dr Leone Payor) (2/2 08)    Risk Factors:  Colonoscopy History:     Date of Last Colonoscopy:  11/14/2007    Results:  Diverticulosis      Complete Medication List: 1)  Lipitor 20 Mg Tabs (Atorvastatin calcium) .... Take one by mouth daily 2)  Altace 10 Mg Caps (Ramipril) .... Take one by mouth daily 3)  Protonix 40 Mg Tbec (Pantoprazole sodium) .... Take 1 tablet by mouth once a day 4)  Azithromycin 250 Mg Tabs (Azithromycin) .... 2 tab by mouth x 1 day, then 1 tab by mouth daily 5)  Anacin 81 Mg Tbec (Aspirin) .... Qd     ]  Preventive Care Screening  Colonoscopy:    Date:  11/14/2007    Results:  Diverticulosis

## 2010-11-11 NOTE — Assessment & Plan Note (Signed)
Summary: arm is swollen   Vital Signs:  Patient Profile:   70 Years Old Male Height:     72 inches Weight:      267 pounds Temp:     97.9 degrees F oral Pulse rate:   68 / minute Pulse rhythm:   regular BP sitting:   120 / 60  (left arm) Cuff size:   regular  Vitals Entered By: Providence Crosby (August 28, 2008 2:43 PM)                 PCP:  Hetty Ely  Chief Complaint:  right arm is swollen.  History of Present Illness: Here for mild itchiness of the right arm which started last night and has intensified as the day has gone on today. He now has very mild swelling and erythema of the lat epicondyle area and down toward the wrist with mild wrmth. No fever or overt pain. He remembers no known exposure or bites. Has not used anything new, food, clothing or skin applications. He feels fine other than some mild itching. He took a Benadryl 'nondrowsy' apporx 1 hour ago. No SOB, Dyspnea.    Prior Medications Reviewed Using: Patient Recall  Current Allergies (reviewed today): ! ZOCOR MORPHINE CODEINE * MAXEQUIN      Physical Exam  General:     Well-developed,well-nourished,in no acute distress; alert,appropriate and cooperative throughout examination Head:     Normocephalic and atraumatic without obvious abnormalities. No apparent alopecia or balding. Eyes:     Conjunctiva clear bilaterally.  Ears:     External ear exam shows no significant lesions or deformities.  Otoscopic examination reveals clear canals, tympanic membranes are intact bilaterally without bulging, retraction, inflammation or discharge. Hearing is grossly normal bilaterally. Nose:     External nasal examination shows no deformity or inflammation. Nasal mucosa are pink and moist without lesions or exudates. Mouth:     Oral mucosa and oropharynx without lesions or exudates.  Teeth in good repair. Neck:     No deformities, masses, or tenderness noted. Lungs:     Normal respiratory effort, chest expands  symmetrically. Lungs are clear to auscultation, no crackles or wheezes. Heart:     Normal rate and regular rhythm. S1 and S2 normal without gallop, murmur, click, rub or other extra sounds. Skin:     as per HPI, no bite mark or break in skin seen. No streaking with mild erythema and warmth in the area of interest.    Impression & Recommendations:  Problem # 1:  URTICARIA, LOCALIZED LAT LEFT ARM (ICD-708.9) Assessment: New Allegra 180mg  now and daily for one week. Benadryl Augmentation tonite. RTC if ssx worsen.  Complete Medication List: 1)  Lipitor 20 Mg Tabs (Atorvastatin calcium) .... Take one by mouth daily 2)  Altace 10 Mg Caps (Ramipril) .... Take one by mouth daily 3)  Protonix 40 Mg Tbec (Pantoprazole sodium) .... Take 1 tablet by mouth twice a day 4)  Anacin 81 Mg Tbec (Aspirin) .... Take 1 tablet by mouth once a day   Patient Instructions: 1)  RTC if sxs worsen.   ]

## 2010-11-11 NOTE — Letter (Signed)
Summary: [name] letter  [place] [facility]  [address] [place], [place] [address]   Phone: [phone]  Fax: [phone]       05/20/2010 MRN: [id number]  Luis Sweeney [address] [place], [place]  [address]  Dear [name],  [facility] [place], and [facility] announce the retirement of Arta Silence, M.D., from full-time practice at the [facility] office effective April 10, 2010 and his plans of returning part-time.  It is important to Dr. Hetty Ely and to our practice that you understand that [facility] has seven physicians in our office for your health care needs.  We will continue to offer the same exceptional care that you have today.    Dr. Hetty Ely has spoken to many of you about his plans for retirement and returning part-time in the fall.   We will continue to work with you through the transition to schedule appointments for you in the office and meet the high standards that [facility] is committed to.   Again, it is with great pleasure that we share the news that Dr. Hetty Ely will return to [facility] in October of 2011 with a reduced schedule.    If you have any questions, or would like to request an appointment with one of our physicians, please call us at [phone] and press the option for Scheduling an appointment.  We take pleasure in providing you with excellent patient care and look forward to seeing you at your next office visit.  Our [facility] Physicians are:  Tillman Abide, M.D. Laurita Quint, M.D. Roxy Manns, M.D. Kerby Nora, M.D. Hannah Beat, M.D. Ruthe Mannan, M.D. We proudly welcomed Raechel Ache, M.D. and Eustaquio Boyden, M.D. to the practice in July/August 2011.  Sincerely,  [facility]

## 2010-11-11 NOTE — Progress Notes (Signed)
Summary: Nuclear Pre-Procedure   Phone Note Outgoing Call Call back at Work Phone [phone]   Call placed by: Stanton Kidney, EMT-P,  March 20, 2009 2:26 PM Summary of Call: Reviewed information on Myoview Information Sheet (see scanned document for further details).  Spoke with Pt.       Nuclear Med Background Indications for Stress Test: Evaluation for Ischemia, Stent Patency   History: Heart Catheterization, Myocardial Infarction, Myocardial Perfusion Study, Stents  History Comments: '04 MI>Heart Cat. AWMI> LAD stent '05 MPS- NL EF= 54%     Nuclear Pre-Procedure Cardiac Risk Factors: Family History - CAD, History of Smoking, Lipids, Obesity Height (in): 72

## 2010-11-11 NOTE — Assessment & Plan Note (Signed)
Summary: f1y   Visit Type:  1 year follow up Primary Provider:  Joycelyn Man  CC:  No complaints.  History of Present Illness: 70 year-old male with CAD s/p anterior MI in 2004 treated with a Drug-eluting stent to the LAD. He underwent a Myoview stress test in 2010 to evaluate exertional dyspnea and this showed no areas of ischemia and preserved LV function with a gated LVEF of 58%.  The patient is doing well at present. He denies chest pain, dyspnea, orthopnea, PND, edema, palpitations, lightheadedness, or syncope.   Current Medications (verified): 1)  Bayer Aspirin Ec Low Dose 81 Mg Tbec (Aspirin) .Marland Kitchen.. 1 Daily By Mouth 2)  Altace 5 Mg Caps (Ramipril) .... Take 1 Capsule By Mouth Two Times A Day 3)  Crestor 10 Mg Tabs (Rosuvastatin Calcium) .Marland Kitchen.. 1 Daily By Mouth 4)  Vitamin D-3 5000 Unit Tabs (Cholecalciferol) .... Take 1 Tablet By Mouth Once A Day  Allergies: 1)  ! Zocor 2)  Morphine 3)  Codeine 4)  * Maxequin  Past History:  Past medical history reviewed for relevance to current acute and chronic problems.  Past Medical History: Reviewed history from 03/13/2009 and no changes required. CAD (ICD-414.00)-treated with drug-eluting stent placement to the proximal left anterior   descending in late 2004.  RENAL INSUFFICIENCY (ICD-588.9) HYPERCHOLESTEROLEMIA (ICD-272.0) URTICARIA, LOCALIZED LAT LEFT ARM (ICD-708.9) CEPHALGIA (ICD-784.0) HEALTH MAINTENANCE EXAM (ICD-V70.0) ANEMIA, IRON DEFICIENCY (ICD-280.9) SPECIAL SCREENING MALIGNANT NEOPLASM OF PROSTATE (ICD-V76.44) HELICOBACTER PYLORI GASTRITIS, RECURRENT (NEEDS PPI FOREVER) (ICD-041.86) PERSONAL HISTORY OF COLONIC POLYPS (ICD-V12.72) ACUTE POSTHEMORRHAGIC ANEMIA (ICD-285.1) DUODENAL ULCER, ACUTE, HEMORRHAGE, WITHOUT OBSTRUCTION (ICD-532.00) GERD (ICD-530.81) HYPERGLYCEMIA (ICD-790.6) MYOCARDIAL INFARCTION, HX OF (ICD-412) Hx of ERB'S PALSY (ICD-767.6) DIVERTICULOSIS, COLON (ICD-562.10)  Review of Systems      Negative except as per HPI   Vital Signs:  Patient profile:   70 year old male Height:      73 inches Weight:      264.25 pounds BMI:     34.99 Pulse rate:   78 / minute Pulse rhythm:   regular Resp:     18 per minute BP sitting:   124 / 58  (left arm) Cuff size:   large  Vitals Entered By: Vikki Ports (July 08, 2010 2:52 PM)  Physical Exam  General:  Pt is alert and oriented, obese male, in no acute distress. HEENT: normal Neck: normal carotid upstrokes without bruits, JVP normal Lungs: CTA CV: RRR without murmur or gallop Abd: soft, NT, positive BS, no bruit, no organomegaly Ext: trace pretibial edema. peripheral pulses 2+ and equal Skin: warm and dry without rash    EKG  Procedure date:  07/08/2010  Findings:      NSR 78 bpm, within normal limits.   Impression & Recommendations:  Problem # 1:  CAD (ICD-414.00)  Pt stable without angina. Continue current therapy except as below. Encouraged diet and weight loss.  His updated medication list for this problem includes:    Bayer Aspirin Ec Low Dose 81 Mg Tbec (Aspirin) .Marland Kitchen... 1 daily by mouth    Altace 5 Mg Caps (Ramipril) .Marland Kitchen... Take 1 capsule by mouth two times a day  Orders: EKG w/ Interpretation (93000)  Problem # 2:  HYPERCHOLESTEROLEMIA (ICD-272.0) Total cholesterol is very low, as are HDL and LDL. Recommend decrease Crestor to 5 mg daily. He is due for upcoming labs with his annual physical in a few months.  His updated medication list for this problem includes:    Crestor 5 Mg  Tabs (Rosuvastatin calcium) .Marland Kitchen... Take one tablet by mouth daily.  CHOL: 91 (06/10/2009)   LDL: 46 (06/10/2009)   HDL: 32.50 (06/10/2009)   TG: 63.0 (06/10/2009)  Patient Instructions: 1)  Your physician recommends that you schedule a follow-up appointment in: 1 year 2)  Your physician has recommended you make the following change in your medication:  DECREASE your CRESTOR to 5 mg by mouth daily. Prescriptions: ALTACE  5 MG CAPS (RAMIPRIL) Take 1 capsule by mouth two times a day  #90 x 3   Entered by:   Whitney Maeola Sarah RN   Authorized by:   Norva Karvonen, MD   Signed by:   Ellender Hose RN on 07/08/2010   Method used:   Electronically to        [facility]* (retail)             ,          Ph: [phone]       Fax: [phone]   RxID:   [id number] CRESTOR 5 MG TABS (ROSUVASTATIN CALCIUM) Take one tablet by mouth daily.  #90 x 3   Entered by:   Whitney Maeola Sarah RN   Authorized by:   Norva Karvonen, MD   Signed by:   Ellender Hose RN on 07/08/2010   Method used:   Electronically to        [facility]* (retail)             ,          Ph: [phone]       Fax: [phone]   RxID:   [id number]

## 2010-11-11 NOTE — Assessment & Plan Note (Signed)
Summary: 10:15 ?UTI,DIARRHEA/CLE   Vital Signs:  Patient profile:   70 year old male Height:      73 inches Weight:      261.6 pounds BMI:     34.64 Temp:     98.1 degrees F oral Pulse rate:   68 / minute Pulse rhythm:   regular BP sitting:   110 / 72  (left arm) Cuff size:   large  Vitals Entered By: Benny Lennert CMA Duncan Dull) (December 18, 2009 10:18 AM)  History of Present Illness: Chief complaint ? uti and diarrhea  70 year old male:  Had a few pains in his ack on saturday, then sunday, continued to have more pain. Then monday, pain with urination - severe pain. Urgency really bad. That has calmed down a little bit.   Had some diarrhea with it. Has about three loose stools. 1 in his life.   ROS: some loose stool, no f/chills/sweats  GEN: WDWN, A&Ox4,NAD. Non-toxic HEENT: Atraumatic, normocephalic. ABD: S, NT, ND, +BS, no rebound. No CVAT. No suprapubic tenderness. EXT: No c/c/e   Allergies: 1)  ! Zocor 2)  Morphine 3)  Codeine 4)  * Maxequin  Past History:  Past medical, surgical, family and social histories (including risk factors) reviewed, and no changes noted (except as noted below).  Past Medical History: Reviewed history from 03/13/2009 and no changes required. CAD (ICD-414.00)-treated with drug-eluting stent placement to the proximal left anterior   descending in late 2004.  RENAL INSUFFICIENCY (ICD-588.9) HYPERCHOLESTEROLEMIA (ICD-272.0) URTICARIA, LOCALIZED LAT LEFT ARM (ICD-708.9) CEPHALGIA (ICD-784.0) HEALTH MAINTENANCE EXAM (ICD-V70.0) ANEMIA, IRON DEFICIENCY (ICD-280.9) SPECIAL SCREENING MALIGNANT NEOPLASM OF PROSTATE (ICD-V76.44) HELICOBACTER PYLORI GASTRITIS, RECURRENT (NEEDS PPI FOREVER) (ICD-041.86) PERSONAL HISTORY OF COLONIC POLYPS (ICD-V12.72) ACUTE POSTHEMORRHAGIC ANEMIA (ICD-285.1) DUODENAL ULCER, ACUTE, HEMORRHAGE, WITHOUT OBSTRUCTION (ICD-532.00) GERD (ICD-530.81) HYPERGLYCEMIA (ICD-790.6) MYOCARDIAL INFARCTION, HX OF (ICD-412) Hx  of ERB'S PALSY (ICD-767.6) DIVERTICULOSIS, COLON (ICD-562.10)  Past Surgical History: Reviewed history from 03/13/2009 and no changes required. Rt. shoulder Rt. wrist Spinal cyst removal Left knee surgery  drug-eluting stent placement  Right inguinal herniorrhaphy   Family History: Reviewed history from 06/13/2009 and no changes required. Father: dec CHF, DM, kidney failure- died age 18 Mother:dec  pneumonia, died age 12 Sister A 33  HTn Dm Sister A 76  Htn Arthritis Sister A 67 Sister A 52 No FH of Colon Cancer:  Social History: Reviewed history from 03/13/2009 and no changes required. 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 1 by mouth two times a day  Encouraged to push clear liquids, get enough rest, and take acetaminophen as needed. To be seen in 10 days if no improvement, sooner if worse.  Orders: UA Dipstick w/o Micro (manual) (81191) T-Culture, Urine ([id number])  Complete Medication List: 1)  Bayer Aspirin Ec Low Dose 81 Mg Tbec (Aspirin) .Marland Kitchen.. 1 daily by mouth 2)  Altace 5 Mg Caps (Ramipril) .... Take 1 capsule by mouth two times a day 3)  Crestor 10 Mg Tabs (Rosuvastatin calcium) .Marland Kitchen.. 1 daily by mouth 4)  Sulfamethoxazole-tmp Ds 800-160 Mg Tabs (Sulfamethoxazole-trimethoprim) .Marland Kitchen.. 1 by mouth two times a day Prescriptions: SULFAMETHOXAZOLE-TMP DS 800-160 MG TABS (SULFAMETHOXAZOLE-TRIMETHOPRIM) 1  by mouth two times a day  #14 x 0   Entered and Authorized by:   Hannah Beat MD   Signed by:   Hannah Beat MD on 12/18/2009   Method used:   [facility]  [facility]ail[facility]       [address]       [place], [place]  [address]       Ph: [phone] or [phone]       Fax: [phone]   RxID:   [id number]   Current Allergies (reviewed today): ! ZOCOR MORPHINE CODEINE [facility] Laboratory Results   Urine Tests   Date/Time Reported: December 18, 2009 10:22 AM   Routine Urinalysis   Color: yellow Appearance: Hazy Glucose: negative   (Normal Range: Negative) Bilirubin: negative   (Normal Range: Negative) Ketone: negative   (Normal Range: Negative) Spec. Gravity: 1.015   (Normal Range: 1.003-1.035) Blood: moderate   (Normal Range: Negative) pH: 6.0   (Normal Range: 5.0-8.0) Protein: negative   (Normal Range: Negative) Urobilinogen: 0.2   (Normal Range: 0-1) Nitrite: negative   (Normal Range: Negative) Leukocyte Esterace: trace   (Normal Range: Negative)        Appended Document: 10:15 ?UTI,DIARRHEA/CLE

## 2010-11-13 NOTE — Progress Notes (Signed)
  EMSI request received sent to [facility]  November 03, 2010 1:23 PM

## 2010-11-13 NOTE — Assessment & Plan Note (Signed)
Summary: F/U CONE  09/27/10/CLE   Vital Signs:  Patient profile:   70 year old male Weight:      262 pounds Temp:     98.9 degrees F oral Pulse rate:   68 / minute Pulse rhythm:   regular BP sitting:   138 / 74  (left arm) Cuff size:   large  Vitals Entered By: Sydell Axon LPN (October 15, 2010 9:02 AM) CC: Hospital follow-up from [facility]   History of Present Illness: Pt here for ER followup, was seen at [facility] for right sided abd pain, really in the flank and up into the RUQ  above the Liver percussion area just below the ribcage anteriorally and into the Esoph sphincter area, sometimes radiating  into the midback. The pain has been there for a few months, nothing particularly makes it worse. He has not eaten anything today and has not had any pain. Yesterday he ate a pastry at midmorning and lunch was apple,  grapes and tangerine....pain began yesterday approx 2PM.  Pain eased off after a while. Abd CT was unrevealing. He has been taking Sulcralfate and Famotidine. He has had acute duodenal hemm in the past and H.Pylori. He was given Meloxicam about 2 mos ago by ortho for left wrist pain...Marland Kitchenhe effevctivley has little use pof his right hand and wrist.  Current Medications (verified): 1)  Bayer Aspirin Ec Low Dose 81 Mg Tbec (Aspirin) .Marland Kitchen.. 1 Daily By Mouth 2)  Altace 5 Mg Caps (Ramipril) .... Take 1 Capsule By Mouth Two Times A Day 3)  Crestor 5 Mg Tabs (Rosuvastatin Calcium) .... Take One Tablet By Mouth Daily. 4)  Vitamin D-3 5000 Unit Tabs (Cholecalciferol) .... Take 1 Tablet By Mouth Once A Day 5)  Hydrocodone-Acetaminophen 5-325 Mg Tabs (Hydrocodone-Acetaminophen) .... Take One By Mouth Every 4-6 Hours As Needed 6)  Sucralfate 1 Gm Tabs (Sucralfate) .... Take One By Mouth Four Times A Day 7)  Famotidine 20 Mg Tabs (Famotidine) .... Take One By Mouth Daily 8)  Meloxicam 15 Mg Tabs (Meloxicam) .... Take One By Mouth Daily As Needed  Allergies: 1)  ! Zocor 2)  Morphine 3)  Codeine 4)   * Maxequin  Physical Exam  General:  Well-developed,well-nourished,in no acute distress; alert,appropriate and cooperative throughout examination, obese, does not look acute. Head:  Normocephalic and atraumatic without obvious abnormalities. No apparent alopecia or balding. Eyes:  Conjunctiva clear bilaterally Ears:  External ear exam shows no significant lesions or deformities.  Otoscopic examination reveals clear canals, tympanic membranes are intact bilaterally without bulging, retraction, inflammation or discharge. Hearing is grossly normal bilaterally. Cerumen bilat. Nose:  External nasal examination shows no deformity or inflammation. Nasal mucosa are pink and moist without lesions or exudates. Mouth:  Oral mucosa and oropharynx without lesions or exudates.  Teeth in good repair. Neck:  No deformities, masses, or tenderness noted. Chest Wall:  No deformities, masses, tenderness or gynecomastia noted. Lungs:  Normal respiratory effort, chest expands symmetrically. Lungs are clear to auscultation, no crackles or wheezes. Heart:  Normal rate and regular rhythm. S1 and S2 normal without gallop, murmur, click, rub or other extra sounds. Abdomen:  Bowel sounds positive,abdomen soft and non-tender without masses, organomegaly or hernias noted. Mildly protuberant. No real tenderness to palpation.   Impression & Recommendations:  Problem # 1:  ABDOMINAL PAIN (ICD-789.00) Assessment Deteriorated Recurrent, different from any discomfort he has had in the past with his multiple GI problems.  Cont Sulcralfate and Famotidine. [place] Maalox for  acute pain. Avoid fatty foods. Discussede prophylaxis for GERD. See GI again. Hold Meloxicam. Orders: Gastroenterology Referral (GI)  Problem # 2:  WRIST PAIN, LEFT (IRJ-188.41) Assessment: Improved Seen by Ortho, given Meloxicam. Told today not to take unless absolutely needed and always take with food.  Complete Medication List: 1)  Bayer Aspirin Ec Low  Dose 81 Mg Tbec (Aspirin) .Marland Kitchen.. 1 daily by mouth 2)  Altace 5 Mg Caps (Ramipril) .... Take 1 capsule by mouth two times a day 3)  Crestor 5 Mg Tabs (Rosuvastatin calcium) .... Take one tablet by mouth daily. 4)  Vitamin D-3 5000 Unit Tabs (Cholecalciferol) .... Take 1 tablet by mouth once a day 5)  Hydrocodone-acetaminophen 5-325 Mg Tabs (Hydrocodone-acetaminophen) .... Take one by mouth every 4-6 hours as needed 6)  Sucralfate 1 Gm Tabs (Sucralfate) .... Take one by mouth four times a day 7)  Famotidine 20 Mg Tabs (Famotidine) .... Take one by mouth daily 8)  Meloxicam 15 Mg Tabs (Meloxicam) .... Take one by mouth daily as needed  Patient Instructions: 1)  Refer to Dr Leone Payor. 2)  25 mins spent with pt. Prescriptions: HYDROCODONE-ACETAMINOPHEN 5-325 MG TABS (HYDROCODONE-ACETAMINOPHEN) Take one by mouth every 4-6 hours as needed  #30 x 0   Entered and Authorized by:   Shaune Leeks MD   Signed by:   Shaune Leeks MD on 10/15/2010   Method used:   Print then Give to Patient   RxID:   [id number] SUCRALFATE 1 GM TABS (SUCRALFATE) Take one by mouth four times a day  #120 x 1   Entered and Authorized by:   Shaune Leeks MD   Signed by:   Shaune Leeks MD on 10/15/2010   Method used:   [facility]  [address] [phone][facility] (retail)       [address]       [place], [place]  [address]       Ph: [phone]       Fax: [phone]   RxID:   [id number] FAMOTIDINE 20 MG TABS (FAMOTIDINE) Take one by mouth daily  #30 x 1   Entered and Authorized by:   Shaune Leeks MD   Signed by:   Shaune Leeks MD on 10/15/2010   Method used:   [facility]        [facility]  [address] [phone]* (retail)       [address]       [place], [place]  [address]       Ph: [phone]       Fax: [phone]   RxID:   [id number]    Orders Added: 1)  Est. Patient Level IV [14431] 2)   Gastroenterology Referral [GI]    Current Allergies (reviewed today): ! ZOCOR MORPHINE CODEINE * MAXEQUIN

## 2010-11-21 ENCOUNTER — Other Ambulatory Visit: Payer: Self-pay | Admitting: Internal Medicine

## 2010-11-21 ENCOUNTER — Ambulatory Visit (INDEPENDENT_AMBULATORY_CARE_PROVIDER_SITE_OTHER): Payer: No Typology Code available for payment source | Admitting: Internal Medicine

## 2010-11-21 ENCOUNTER — Encounter: Payer: Self-pay | Admitting: Internal Medicine

## 2010-11-21 DIAGNOSIS — Z8711 Personal history of peptic ulcer disease: Secondary | ICD-10-CM

## 2010-11-21 DIAGNOSIS — R1011 Right upper quadrant pain: Secondary | ICD-10-CM

## 2010-11-24 ENCOUNTER — Ambulatory Visit (HOSPITAL_COMMUNITY)
Admission: RE | Admit: 2010-11-24 | Discharge: 2010-11-24 | Disposition: A | Discharge status: Home / Self Care | Payer: Medicare Other | Source: Ambulatory Visit | Attending: Internal Medicine | Admitting: Internal Medicine

## 2010-11-24 DIAGNOSIS — R1011 Right upper quadrant pain: Secondary | ICD-10-CM | POA: Insufficient documentation

## 2010-11-26 ENCOUNTER — Other Ambulatory Visit: Payer: Self-pay | Admitting: Internal Medicine

## 2010-11-26 ENCOUNTER — Other Ambulatory Visit: Payer: No Typology Code available for payment source

## 2010-11-26 ENCOUNTER — Other Ambulatory Visit (AMBULATORY_SURGERY_CENTER): Payer: No Typology Code available for payment source | Admitting: Internal Medicine

## 2010-11-26 ENCOUNTER — Encounter: Payer: Self-pay | Admitting: Family Medicine

## 2010-11-26 ENCOUNTER — Encounter (INDEPENDENT_AMBULATORY_CARE_PROVIDER_SITE_OTHER): Payer: Self-pay | Admitting: *Deleted

## 2010-11-26 ENCOUNTER — Encounter: Payer: Self-pay | Admitting: Internal Medicine

## 2010-11-26 DIAGNOSIS — K2980 Duodenitis without bleeding: Secondary | ICD-10-CM

## 2010-11-26 DIAGNOSIS — K315 Obstruction of duodenum: Secondary | ICD-10-CM

## 2010-11-26 DIAGNOSIS — R300 Dysuria: Secondary | ICD-10-CM | POA: Insufficient documentation

## 2010-11-26 DIAGNOSIS — K2970 Gastritis, unspecified, without bleeding: Secondary | ICD-10-CM

## 2010-11-26 DIAGNOSIS — R1011 Right upper quadrant pain: Secondary | ICD-10-CM

## 2010-11-26 DIAGNOSIS — K2940 Chronic atrophic gastritis without bleeding: Secondary | ICD-10-CM

## 2010-11-26 HISTORY — PX: ESOPHAGOGASTRODUODENOSCOPY: SHX1529

## 2010-11-26 LAB — URINALYSIS, ROUTINE W REFLEX MICROSCOPIC
Bilirubin Urine: NEGATIVE
Hgb urine dipstick: NEGATIVE
Ketones, ur: NEGATIVE
Leukocytes, UA: NEGATIVE
Nitrite: NEGATIVE
Specific Gravity, Urine: 1.02 (ref 1.000–1.030)
Total Protein, Urine: NEGATIVE
Urine Glucose: NEGATIVE
Urobilinogen, UA: 1 (ref 0.0–1.0)
pH: 6.5 (ref 5.0–8.0)

## 2010-11-27 ENCOUNTER — Telehealth: Payer: Self-pay | Admitting: Family Medicine

## 2010-12-03 NOTE — Procedures (Addendum)
Summary: Upper Endoscopy  Patient: Luis Sweeney Note: All result statuses are Final unless otherwise noted.  Tests: (1) Upper Endoscopy (EGD)   EGD Upper Endoscopy       DONE     [facility]     [address]     [place], [place]  [address]           ENDOSCOPY PROCEDURE REPORT           PATIENT:  Luis Sweeney  MR#:  [id number]     BIRTHDATE:  Feb 10, 1941, 70 yrs. old  GENDER:  male           ENDOSCOPIST:  Iva Boop, MD, [facility]           PROCEDURE DATE:  11/26/2010     PROCEDURE:  EGD with biopsy, 08[address]     ASA CLASS:  Class II     INDICATIONS:  abdominal pain, right upper quadrant -  CT, [place] Abd     unrevealing as are labs           MEDICATIONS:   Fentanyl 50 mcg IV, Versed 4 mg IV     TOPICAL ANESTHETIC:  Exactacain Spray           DESCRIPTION OF PROCEDURE:   After the risks benefits and     alternatives of the procedure were thoroughly explained, informed     consent was obtained.  The [facility] [id number] endoscope was     introduced through the mouth and advanced to the second portion of     the duodenum, without limitations.  The instrument was slowly     withdrawn as the mucosa was fully examined.     <<PROCEDUREIMAGES>>           Duodenitis was found in the bulb and descending duodenum.     Edematous and erythematous mucosa. Multiple biopsies were obtained     and sent to pathology.  A stricture was found. Fixed stenosis in     D1/D2 junction area. Scope will not pass through. Multiple     biopsies were obtained and sent to pathology.  Mild gastritis was     found. Mottled and erythematous mucosa. Multiple biopsies were     obtained and sent to pathology.  Otherwise the examination was     normal.    Retroflexed views revealed no abnormalities.    The     scope was then withdrawn from the patient and the procedure     completed.           COMPLICATIONS:  None           ENDOSCOPIC IMPRESSION:     1) Duodenitis in the bulb/descending duodenum -biopsied     2) Stricture in bulb/descending duodenum area, likely from prior     ulcer disease - biopsied     3) Mild gastritis     4) Otherwise normal examination     RECOMMENDATIONS:     1) Finish Nexium samples and start pantoprazole 40 mg daily     2) UA and culture today (new dysuria) - ordered by office     3) will contact with all results and plans.           Iva Boop, MD, [facility]           CC:  Arta Silence, MD     The Patient           n.  eSIGNED:   Iva Boop at 11/26/2010 02:19 PM           Smitty Knudsen, [phone]  Note: An exclamation mark (!) indicates a result that was not dispersed into the flowsheet. Document Creation Date: 11/26/2010 2:19 PM _______________________________________________________________________  (1) Order result status: Final Collection or observation date-time: 11/26/2010 14:01 Requested date-time:  Receipt date-time:  Reported date-time:  Referring Physician:   Ordering Physician: Stan Head [phone]) Specimen Source:  Source: Launa Grill Order Number: [id number] Lab site:   Appended Document: Upper Endoscopy    Clinical Lists Changes  Observations: Added new observation of PAST SURG HX: Rt. shoulder Rt. wrist Spinal cyst removal Left knee surgery  drug-eluting stent placement  Right inguinal herniorrhaphy  EGD Mild Gastritis, Duodenitis, Duodenal Stricture (Dr Leone Payor) 11/26/2010 (12/03/2010 12:33)       Past Surgical History:    Rt. shoulder    Rt. wrist    Spinal cyst removal    Left knee surgery     drug-eluting stent placement     Right inguinal herniorrhaphy     EGD Mild Gastritis, Duodenitis, Duodenal Stricture (Dr Leone Payor) 11/26/2010

## 2010-12-03 NOTE — Progress Notes (Signed)
Summary: ??kidney stone  Phone Note Call from Patient Call back at Home Phone [phone] P PH     Caller: Patient Call For: Shaune Leeks MD Summary of Call: Patient feels like he  is in the process of passing  a kidney stone, he is in alot of pain, has had some blood in his urine. He says that he has had 3 or more kidney stones in his life and this is what it felt like.  He is asking if he could get something called in to help with the pain. Advised that he may need appt  before medication can be called in.  Initial call taken by: Melody Comas,  November 27, 2010 11:44 AM  Follow-up for Phone Call        Have him drink cherry juice (get it at the food store) and try Vicodin Follow-up by: Shaune Leeks MD,  November 27, 2010 11:59 AM  Additional Follow-up for Phone Call Additional follow up Details #1::        Left message on machine for patient to call back. Sydell Axon LPN  November 27, 2010 1:33 PM  Rx called to pharmacy. Sydell Axon LPN  November 27, 2010 2:14 PM  Left message on machine for patient to call back. Sydell Axon LPN  November 27, 2010 3:10 PM     Additional Follow-up for Phone Call Additional follow up Details #2::    Patient notified as instructed by telephone. Follow-up by: Sydell Axon LPN,  November 27, 2010 4:11 PM  New/Updated Medications: HYDROCODONE-ACETAMINOPHEN 5-325 MG TABS (HYDROCODONE-ACETAMINOPHEN) one tab by mouth qid as needed kidney stone pain Prescriptions: HYDROCODONE-ACETAMINOPHEN 5-325 MG TABS (HYDROCODONE-ACETAMINOPHEN) one tab by mouth qid as needed kidney stone pain  #20 x 0   Entered and Authorized by:   Shaune Leeks MD   Signed by:   Sydell Axon LPN on 14/78/2956   Method used:   [facility] ...       [facility]  [address] [id number]       [address]       [place], [place]  [address]       Ph: [phone]       Fax: [phone]   RxID:   [id number]

## 2010-12-03 NOTE — Miscellaneous (Signed)
Summary: Pantoprazole Rx  Clinical Lists Changes  Medications: Changed medication from NEXIUM 40 MG CPDR (ESOMEPRAZOLE MAGNESIUM) Take 1 capsule by mouth once a day to PANTOPRAZOLE SODIUM 40 MG TBEC (PANTOPRAZOLE SODIUM) 1 by mouth 30 minutes before breakfast - Signed Rx of PANTOPRAZOLE SODIUM 40 MG TBEC (PANTOPRAZOLE SODIUM) 1 by mouth 30 minutes before breakfast;  #30 x 11;  Signed;  Entered by: Iva Boop MD, [facility];  Authorized by: Iva Boop MD, [facility];  Method used: [facility]  [id number] [address], [place], [place], [place]  [address], Ph: [phone], Fax: [phone]    Prescriptions: PANTOPRAZOLE SODIUM 40 MG TBEC (PANTOPRAZOLE SODIUM) 1 by mouth 30 minutes before breakfast  #30 x 11   Entered and Authorized by:   Iva Boop MD, [facility]   Signed by:   Iva Boop MD, FACG on 11/26/2010   Method used:   [facility]        [facility]  [address] [facility]ail)       [address]       [place], [place]  [address]       Ph: [phone]       Fax: [phone]   RxID:   [id number]

## 2010-12-03 NOTE — Miscellaneous (Signed)
Summary: dysuria   Clinical Lists Changes  Problems: Added new problem of DYSURIA (ICD-788.1) - Signed Orders: Added new Test order of T-Culture, Urine ([id number]) - Signed Added new Test order of TLB-Udip w/ Micro (81001-URINE) - Signed Added new Test order of TLB-Udip w/ Micro (81001-URINE) - Signed Added new Test order of T-Culture, Urine ([id number]) - Signed   reports 1-2 days of dysuria when presenting for EGD - no fever, mild left flank pain, no gross hematuria   Impression & Recommendations:  Problem # 1:  DYSURIA (ICD-788.1)  Orders: TLB-Udip w/ Micro (81001-URINE) T-Culture, Urine ([id number])

## 2010-12-03 NOTE — Assessment & Plan Note (Signed)
Summary: abdominal pain   History of Present Illness Visit Type: Follow-up Consult Primary GI MD: Stan Head MD [facility] Primary Provider: Joycelyn Man Requesting Provider: Joycelyn Man Chief Complaint: Right sided pain, post er visit.  History of Present Illness:   Patient went to the ER on 12/17 with abdominal pain he has followed up with Joycelyn Man. Dr. Hetty Ely advised him to come to Dr. Leone Payor. His abdominal pain is better than it was in December.   RUQ pain that radiated into the lower central chest area. It began as "a little discomfort" that then became severe resultng in the ED visit. It was sharp and continuous. Denies changes with movement or breathing.  Now he has mild discomfort in RUQ but overall better. No food triggers, does not seem to disturb his sleep.  he has been holding the sucralfate and famotidine for about 1 week.   GI Review of Systems    Reports abdominal pain.     Location of  Abdominal pain: right side.    Denies acid reflux, belching, bloating, chest pain, dysphagia with liquids, dysphagia with solids, heartburn, loss of appetite, nausea, vomiting, vomiting blood, weight loss, and  weight gain.        Denies anal fissure, black tarry stools, change in bowel habit, constipation, diarrhea, diverticulosis, fecal incontinence, heme positive stool, hemorrhoids, irritable bowel syndrome, jaundice, light color stool, liver problems, rectal bleeding, and  rectal pain.    Current Medications (verified): 1)  Bayer Aspirin Ec Low Dose 81 Mg Tbec (Aspirin) .Marland Kitchen.. 1 Daily By Mouth 2)  Altace 5 Mg Caps (Ramipril) .... Take 1 Capsule By Mouth Two Times A Day 3)  Crestor 5 Mg Tabs (Rosuvastatin Calcium) .... Take One Tablet By Mouth Daily. 4)  Vitamin D-3 5000 Unit Tabs (Cholecalciferol) .... Take 1 Tablet By Mouth Once A Day 5)  Hydrocodone-Acetaminophen 5-325 Mg Tabs (Hydrocodone-Acetaminophen) .... Take One By Mouth Every 4-6 Hours As Needed 6)   Sucralfate 1 Gm Tabs (Sucralfate) .... Take One By Mouth Four Times A Day As Needed (Holding) 7)  Famotidine 20 Mg Tabs (Famotidine) .... Take One By Mouth Daily (Holding)  Allergies (verified): 1)  ! Zocor 2)  Morphine 3)  Codeine 4)  * Maxequin  Past History:  Past Medical History: CAD (ICD-414.00)-treated with drug-eluting stent placement to the proximal left anterior   descending in late 2004.  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 Rt. shoulder Rt. wrist Spinal cyst removal Left knee surgery  drug-eluting stent placement  Right inguinal herniorrhaphy   Family History: Father: dec CHF, DM, kidney failure- died age 23 Mother:dec  pneumonia, died age 21 Sister A 10  HTn Dm Sister A 64  Htn Arthritis Sister A 45 Sister A 70 No FH of Colon Cancer: Family History of Ovarian Cancer: sister ??  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  NAD Eyes:  anicteric Chest Wall:  nontender Lungs:  Clear throughout to auscultation. Heart:  Regular rate and rhythm; no murmurs, rubs,  or bruits. Abdomen:  obese diastasis recti soft, minmally tender to deep palpation in right upper quadrant no HSM/mass Psych:  Alert and cooperative. Normal mood and affect.   Impression & Recommendations:  Problem # 1:  RUQ PAIN (ICD-789.01) Assessment New This sounds more like biliary colic to me so will evaluate with an ultrasound. reasonable to cover with PPI so nexium samples also. PUD (recurrent) is possible. Orders: Ultrasound Abdomen (UAS)  Problem # 2:  PERSONAL HISTORY OF PEPTIC ULCER DISEASE (ICD-V12.71) Assessment: Unchanged 2006 and 2008 has had duodenal ulcers and hemorrhage in 2008. H. pylori + - PrevPak then TCN, Peptobismol, metronidazole and PPI 12/21/07 H. pylori urea breath test was + and he was treated with a salvage regimen of Levaquin 250 mg two times a day, amoxicillin 1 gram two times a day and PPI two times a day for 2 weeks in 2009. Looks like he was retested in 6/09 and from 12/09 note repeat testing was negative for H. pylori but I cannot find the actual test result in paper chart or EMR. In 12/09 I told him since we eradicated the H. pylori could go off PPI.   WILL TRY TO FIND 6/09 H. PYLORI RESULTS  Problem # 3:  Hx of HELICOBACTER PYLORI GASTRITIS, RECURRENT (ICD-041.86) Assessment: Comment Only 2006 and 2008 has had duodenal ulcers and hemorrhage in 2008. H. pylori + - PrevPak then TCN, Peptobismol, metronidazole and PPI 12/21/07 H. pylori urea breath test was + and he was treated with a salvage regimen of Levaquin 250 mg two times a day, amoxicillin 1 gram two times a day and PPI two times a day for 2 weeks in 2009. Looks like he was retested in 6/09 and from 12/09 note repeat testing was negative for H. pylori but I cannot find the actual test result in paper chart or EMR.  In 12/09 I told him since we eradicated the H. pylori could go off PPI.   WILL TRY TO FIND 6/09 H. PYLORI RESULTS  Patient Instructions: 1)  You have been scheduled for an abdominal ultrasound at [facility] (1st floor) for Monday 11/24/10 @ 8:30 am. Please arrive at 8:15 am for 8:30 am appointment. No food or drink after midnight night before your test. 2)  We have given you samples of Nexium 40 mg to take 1 capsule by mouth once daily about 30 minutes before breakfast. 3)  The medication list was reviewed and reconciled.  All changed / newly prescribed medications were explained.  A complete medication list was provided to the patient / caregiver.

## 2010-12-04 ENCOUNTER — Other Ambulatory Visit: Payer: Self-pay | Admitting: Internal Medicine

## 2010-12-04 ENCOUNTER — Other Ambulatory Visit: Payer: No Typology Code available for payment source

## 2010-12-04 ENCOUNTER — Encounter: Payer: Self-pay | Admitting: Internal Medicine

## 2010-12-04 ENCOUNTER — Encounter (INDEPENDENT_AMBULATORY_CARE_PROVIDER_SITE_OTHER): Payer: Self-pay | Admitting: *Deleted

## 2010-12-04 DIAGNOSIS — N390 Urinary tract infection, site not specified: Secondary | ICD-10-CM

## 2010-12-04 LAB — URINALYSIS
Bilirubin Urine: NEGATIVE
Ketones, ur: NEGATIVE
Leukocytes, UA: NEGATIVE
Nitrite: NEGATIVE
Specific Gravity, Urine: >=1.03 (ref 1.000–1.030)
Total Protein, Urine: NEGATIVE
Urine Glucose: NEGATIVE
Urobilinogen, UA: 0.2 (ref 0.0–1.0)
pH: 5 (ref 5.0–8.0)

## 2010-12-22 LAB — URINALYSIS, ROUTINE W REFLEX MICROSCOPIC
Bilirubin Urine: NEGATIVE
Glucose, UA: NEGATIVE mg/dL
Ketones, ur: NEGATIVE mg/dL
Leukocytes, UA: NEGATIVE
Nitrite: NEGATIVE
Protein, ur: NEGATIVE mg/dL
Specific Gravity, Urine: 1.022 (ref 1.005–1.030)
Urobilinogen, UA: 1 mg/dL (ref 0.0–1.0)
pH: 5 (ref 5.0–8.0)

## 2010-12-22 LAB — DIFFERENTIAL
Basophils Absolute: 0 10*3/uL (ref 0.0–0.1)
Basophils Relative: 1 % (ref 0–1)
Eosinophils Absolute: 0.1 10*3/uL (ref 0.0–0.7)
Eosinophils Relative: 1 % (ref 0–5)
Lymphocytes Relative: 21 % (ref 12–46)
Lymphs Abs: 1.4 10*3/uL (ref 0.7–4.0)
Monocytes Absolute: 0.5 10*3/uL (ref 0.1–1.0)
Monocytes Relative: 8 % (ref 3–12)
Neutro Abs: 4.6 10*3/uL (ref 1.7–7.7)
Neutrophils Relative %: 70 % (ref 43–77)

## 2010-12-22 LAB — CBC
HCT: 42 % (ref 39.0–52.0)
Hemoglobin: 14.3 g/dL (ref 13.0–17.0)
MCH: 30.7 pg (ref 26.0–34.0)
MCHC: 34 g/dL (ref 30.0–36.0)
MCV: 90.1 fL (ref 78.0–100.0)
Platelets: 213 10*3/uL (ref 150–400)
RBC: 4.66 MIL/uL (ref 4.22–5.81)
RDW: 13.8 % (ref 11.5–15.5)
WBC: 6.6 10*3/uL (ref 4.0–10.5)

## 2010-12-22 LAB — POCT CARDIAC MARKERS
CKMB, poc: 1.9 ng/mL (ref 1.0–8.0)
Myoglobin, poc: 109 ng/mL (ref 12–200)
Troponin i, poc: <0.05 ng/mL (ref 0.00–0.09)

## 2010-12-22 LAB — COMPREHENSIVE METABOLIC PANEL
ALT: 22 U/L (ref 0–53)
AST: 24 U/L (ref 0–37)
Albumin: 3.7 g/dL (ref 3.5–5.2)
Alkaline Phosphatase: 34 U/L — ABNORMAL LOW (ref 39–117)
BUN: 19 mg/dL (ref 6–23)
CO2: 21 mEq/L (ref 19–32)
Calcium: 8.8 mg/dL (ref 8.4–10.5)
Chloride: 106 mEq/L (ref 96–112)
Creatinine, Ser: 1.46 mg/dL (ref 0.4–1.5)
GFR calc Af Amer: 58 mL/min — ABNORMAL LOW (ref >60)
GFR calc non Af Amer: 48 mL/min — ABNORMAL LOW (ref >60)
Glucose, Bld: 117 mg/dL — ABNORMAL HIGH (ref 70–99)
Potassium: 4.5 mEq/L (ref 3.5–5.1)
Sodium: 136 mEq/L (ref 135–145)
Total Bilirubin: 0.2 mg/dL — ABNORMAL LOW (ref 0.3–1.2)
Total Protein: 6.9 g/dL (ref 6.0–8.3)

## 2010-12-22 LAB — URINE MICROSCOPIC-ADD ON

## 2010-12-22 LAB — LIPASE, BLOOD: Lipase: 27 U/L (ref 11–59)

## 2010-12-22 LAB — APTT: aPTT: 29 seconds (ref 24–37)

## 2010-12-22 LAB — PROTIME-INR
INR: 0.93 (ref 0.00–1.49)
Prothrombin Time: 12.7 seconds (ref 11.6–15.2)

## 2011-01-30 ENCOUNTER — Other Ambulatory Visit: Payer: Self-pay | Admitting: Cardiovascular Disease

## 2011-02-24 NOTE — Assessment & Plan Note (Signed)
[facility]                            CARDIOLOGY OFFICE NOTE   Luis Sweeney, Luis Sweeney                        MRN:          [id number]  DATE:04/08/2007                            DOB:          Jul 29, 1941    HISTORY OF PRESENT ILLNESS:  Luis Sweeney is a 70 year old gentleman who  suffered anterior myocardial infarction in December 2004.  I placed a  CYPHER drug-eluting stent in his proximal LAD.  He had no other  significant disease.  His ejection fraction is 45%.  He has generally  done well post infarction.  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  He has not had any chest  discomfort, exertional dyspnea, orthopnea, PND, edema, claudication,  syncope, pre-syncope, or palpitations.   CURRENT MEDICATIONS:  1. Ramipril 10 mg daily.  2. Lipitor 20 mg daily.  3. Toprol XL 50 mg daily.  4. Aspirin 81 mg daily.   PHYSICAL EXAMINATION:  He is generally well-appearing in no distress.  Heart rate 59, blood pressure 118/70, and weight 258 pounds.  Weight is  unchanged from a year ago.  He has no jugular venous distension, thyromegaly, or lymphadenopathy.  Respiratory effort is normal.  Lungs are clear to auscultation.  He has a nondisplaced point of maximal impulse.  There is a regular rate  and rhythm without murmur, rub, or gallop.  ABDOMEN:  Soft, nondistended, nontender.  There is no  hepatosplenomegaly.  Bowel sounds are normal.  EXTREMITIES:  Warm without clubbing, cyanosis, edema, or ulceration.  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   ELECTROCARDIOGRAM:  Demonstrates sinus bradycardia and is otherwise  normal.   LABORATORY STUDIES:  Feb 18, 2007 remarkable for creatinine of 1.6, which  is down a bit from previous.  Potassium was 4.6 and sodium 140.   IMPRESSION/RECOMMENDATIONS:  1. Coronary artery disease with prior anterior myocardial infarction,      ejection fraction 45%:  Continue aspirin,  ACE inhibitor, and beta      blocker.  2. Hypercholesterolemia:  Check lipids and LFTs today.  Managed by Dr.      Hetty Ely.  Goal LDL less than 70.  3. Renal insufficiency:  Stage III chronic kidney disease.  Etiology      is not clear.  No evidence of renal artery stenosis on a prior CT      and no protein on a urinalysis a year ago.   The patient will follow up with Dr. Simona Huh in 1 year's time.     Salvadore Farber, MD  Electronically Signed    WED/MedQ  DD: 04/08/2007  DT: 04/08/2007  Job #: [id number]

## 2011-02-24 NOTE — Discharge Summary (Signed)
NAMEDIONTE, BLAUSTEIN                 ACCOUNT NO.:  [id number]   MEDICAL RECORD NO.:  [id number]          PATIENT TYPE:  INP   LOCATION:  4743                         FACILITY:  [facility]   PHYSICIAN:  Valerie A. Felicity Coyer, MDDATE OF BIRTH:  04-Dec-1940   DATE OF ADMISSION:  10/02/2007  DATE OF DISCHARGE:  10/05/2007                               DISCHARGE SUMMARY   DISCHARGE DIAGNOSES:  1. Upper gastrointestinal bleed, status post endoscopy December 23.      Notable for duodenal ulcer with hemorrhage.  2. Coronary artery disease, status post stent to the left anterior      descending in 2004 with left ventricular ejection fraction of 45%      at that time.   HISTORY OF PRESENT ILLNESS:  Luis Sweeney is a 70 year old gentleman who was  admitted on October 02, 2007, with chief complaint of dark stools.  He  noted one dark stool on the evening prior to admission and two on the  morning of admission.  He denied any recent NSAID use.  He was admitted  for further evaluation and treatment.   PAST MEDICAL HISTORY:  1. Coronary artery disease.  2. Peptic ulcer disease.   COURSE OF HOSPITALIZATION:  1. GI bleed.  The patient was admitted.  He was seen by [facility] and      underwent endoscopy on October 03, 2007, which revealed the      duodenal ulcer with hemorrhage.  The patient received 2 units of      packed red blood cells during this admission for acute blood loss      anemia.  Aspirin was held, and he was placed on twice daily proton      pump inhibitor.  As the patient remained hemodynamically stable, he      was discharged to home.   MEDICATIONS AT THE TIME OF DISCHARGE:  1. Protonix 40 mg p.o. b.i.d. x2 weeks, then 40 mg p.o. daily      thereafter.  2. Hold aspirin for 3 weeks, then resume 81 mg p.o. daily.  3. Altace 5 mg p.o. b.i.d.  4. Lipitor 20 mg p.o. daily.   PERTINENT LABORATORY DATA:  At time of discharge, hemoglobin 7.9,  hematocrit 23.6.   DISPOSITION:  The patient  was discharged to home.   FOLLOWUP:  The patient was scheduled follow up with Dr. Arlyce Dice of  [facility] on January 21 at 11:00 a.m.   Of note, I was not involved directly in the care of this patient.      Sandford Craze, NP      Raenette Rover. Felicity Coyer, MD  Electronically Signed    MO/MEDQ  D:  10/27/2007  T:  10/27/2007  Job:  [id number]

## 2011-02-24 NOTE — Assessment & Plan Note (Signed)
[facility]                         GASTROENTEROLOGY OFFICE NOTE   Luis Sweeney, Luis Sweeney                        MRN:          [id number]  DATE:01/04/2008                            DOB:          1941-01-16    CHIEF COMPLAINT:  Followup of Helicobacter pylori.   VITAL SIGNS:  Weight 255 pounds, pulse 60, blood pressure 108/72.   He is on iron supplement as recommended because of his iron deficiency  anemia that we discovered.  He is due to follow up with Dr. Hetty Ely on  that with his physical exam soon.   His medication and allergy lists and past medical history are reviewed  and updated and/or unchanged.   PROBLEMS:  1. Persistent H. pylori.  His breath test was positive.  He has been      treated with Prevpac, as well as tetracycline, metronidazole, Pepto      Bismol and PPI in the past.  He must have a resistant organism.  We      will treat this with a salvage regimen of Levaquin 250 mg b.i.d.,      amoxicillin 1 g b.i.d., and PPI b.i.d. for two weeks.  Plan on      retesting of his H. pylori breath test in early June.  He is to      stop his PPI on May 18.  We will coordinate the breath test.  If it      is persistently positive, he is going to need to stay on a PPI      forever anyway, I think, but that would amplify the need to do so.  2. Duodenal ulcer with hemorrhage.  That should be healed by now.      Stay on PPI, treat H. pylori as above.  3. Iron deficiency anemia, likely due to his previous blood losses.      He is on iron supplementation and will follow up with Dr. Hetty Ely.     Iva Boop, MD,FACG  Electronically Signed    CEG/MedQ  DD: 01/04/2008  DT: 01/04/2008  Job #: [id number]   cc:   Arta Silence, MD

## 2011-02-24 NOTE — Assessment & Plan Note (Signed)
[facility]                            CARDIOLOGY OFFICE NOTE   Luis Sweeney, Luis Sweeney                        MRN:          [id number]  DATE:03/13/2008                            DOB:          [birth year]    PRIMARY CARE PHYSICIAN:  Arta Silence, M.D.   REASON FOR VISIT:  Cardiac follow up.   HISTORY OF PRESENT ILLNESS:  Luis Sweeney is a pleasant former patient of  Luis Sweeney with a history of remote anterior wall myocardial infarction  treated with drug-eluting stent placement to the proximal left anterior  descending in late 2004.  He had follow-up Myoview in 2005 and since  that time has been managed medically with very good symptom control.  He  continues to deny any problems of angina or limiting breathlessness  beyond NYHA Class II.  His electrocardiogram today is normal showing  sinus rhythm at 82 beats per minute.  He states that his cholesterol  numbers have looked good and have been followed by Luis Sweeney.  He is  on Lipitor and has tolerated this well.  He does state that he stopped  his Toprol XL, as it seemed to hold him back some, and that he actually  feels better since he has been off this medicine.  He stopped this some  time around the point that he saw Luis Sweeney last year.  Otherwise, he  has no palpitations, orthopnea, PND or syncope.  He has not had a follow-  up ischemic evaluation since 2005, and we talked about this some today.  He feels fairly adamantly that he does not want to go through a follow-  up stress test, and it is a bit hard to argue this point, given the fact  that he has been so symptomatically stable.   ALLERGIES:  1. MORPHINE.  2. CODEINE.   MEDICATIONS:  1. Aspirin 81 mg p.o. daily.  2. Lipitor 20 mg p.o. daily.  3. Altace 10 mg p.o. daily.  4. Iron supplements.  5. Sublingual nitroglycerin p.r.n.   REVIEW OF SYSTEMS:  As in the history of present illness.  No  claudication.  Otherwise systems are  negative.   PHYSICAL EXAMINATION:  VITAL SIGNS:  Blood pressure 143/68, heart rate  of 82, weight is 260 pounds.  GENERAL:  The patient is comfortable, in no acute distress.  HEENT:  Conjunctivae was normal.  Oropharynx clear.  NECK:  Supple.  No elevated jugular venous pressure.  No loud bruits or  thyromegaly.  LUNGS:  Clear without labored breathing at rest.  CARDIAC:  Regular rate and rhythm.  No murmur or gallop.  ABDOMEN:  Obese, nontender.  Normoactive bowel sounds.  EXTREMITIES:  No frank pitting edema.  He has a chronic deformity and  limited motion of the right and his upper extremity, scar in the right  shoulder area.  Has weakness in the arm that is chronic.  No kyphosis  noted.  NEUROPSYCHIATRIC:  The patient is alert and oriented x3.  Affect is  appropriate.   IMPRESSION/RECOMMENDATIONS:  1. Cardiovascular disease status post anterior  wall infarction with      subsequent drug-eluting stent placement in the proximal left      anterior descending in 2004.  The patient is doing very well with      no angina or limiting shortness of breath.  He prefers to hold off      any follow-up stress testing, and I have asked him to continue      medical therapy and let us know if he develops any symptoms of      concern.  His electrocardiogram today is normal.  He will continue      to have a yearly follow up, and I will schedule this with Dr.      Excell Sweeney.  2. Hyperlipidemia.  Follow up by Luis Sweeney.  He continues on statin      therapy.  LDL should be around 70.  3. Chronic renal insufficiency.  4. History of bleeding duodenal ulcer followed by Luis Sweeney and felt      to be stable back in March.  The patient continues on aspirin.  He      is no longer on Plavix.     Luis Sidle, MD  Electronically Signed    SGM/MedQ  DD: 03/13/2008  DT: 03/13/2008  Job #: [id number]   cc:   Arta Silence, MD

## 2011-02-27 NOTE — Op Note (Signed)
[name], [name]                 ACCOUNT NO.:  [id number]   MEDICAL RECORD NO.:  [id number]          PATIENT TYPE:  AMB   LOCATION:  DSC                          FACILITY:  [facility]   PHYSICIAN:  Thornton Park. Daphine Deutscher, MD  DATE OF BIRTH:  [birth year]   DATE OF PROCEDURE:  DATE OF DISCHARGE:                               OPERATIVE REPORT   PREOPERATIVE DIAGNOSIS:  Right inguinal hernia.   POSTOPERATIVE DIAGNOSIS:  Large right indirect inguinal hernia.   PROCEDURE:  Right inguinal herniorrhaphy with mesh.   SURGEON:  Thornton Park. Daphine Deutscher, MD   ASSISTANT:  None.   ANESTHESIA:  General.   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  He was taken to room #8 at [facility] and given general and the scrotum was prepped with Techni-Care  and draped sterilely.  An oblique incision was made through a very large  2-3-inch panniculus down to the external oblique.  I mobilized the cord  structures and I found a very large, fatty-lined indirect sac  anteromedially, which I dissected free from the cord structures and then  took it up high and then reduced it up into the abdomen.  I held it in  place with a sponge while I placed two stitches of Prolene to tighten up  the ring.  I then removed that and then cut a piece of mesh to fit and  sutured it along the inguinal ligament with a running 2-0 below and then  it brought around the cord structures, sutured it medially with  interrupted 2-0 Prolene and then anchored it to itself with a horizontal  mattress suture and tucked it beneath the external oblique.  The  external oblique was then closed with running 2-0 Vicryl, 4-0 Vicryl was  used in the subcutaneous tissue and the skin was closed with Dermabond.  The patient will be given for Darvocet-N 100 for pain.  He will followed  up in the office in 2-3 weeks.      Thornton Park Daphine Deutscher, MD  Electronically Signed     MBM/MEDQ  D:  02/18/2007  T:  02/18/2007  Job:  [id number]   cc:   Arta Silence, MD

## 2011-02-27 NOTE — Cardiovascular Report (Signed)
NAME:  COLTIN, CASHER NO.:  [id number]   MEDICAL RECORD NO.:  [id number]                   PATIENT TYPE:  INP   LOCATION:  2928                                 FACILITY:  [facility]   PHYSICIAN:  Salvadore Farber, M.D.             DATE OF BIRTH:  1941/05/01   DATE OF PROCEDURE:  09/03/2003  DATE OF DISCHARGE:                              CARDIAC CATHETERIZATION   PROCEDURE:  Left heart catheterization, left ventriculography, coronary  angiography, stent to the left anterior descending.   INDICATIONS:  Mr. Allerton is a 70 year old gentleman without prior history of  cardiovascular disease.  Risk factors are only distant smoking history, age,  and sex.  He was admitted yesterday with non ST segment elevation myocardial  infarction.  Electrocardiogram demonstrated anterior and inferior T-wave  inversions and CK rose to a peak of 279 with MB rising to 25.8.  Troponin  peaked at 1.94.  He did not have any recurrent chest discomfort while  treated with medical therapy and was referred for diagnostic angiography.   PROCEDURAL TECHNIQUE:  Informed consent was obtained.  Under 1% lidocaine  local anesthesia a 6-French sheath was placed in the right femoral artery  using the modified Seldinger technique.  Diagnostic angiography and  ventriculography were performed using JL4, JR4, and pigtail catheters.  The  case then proceeded to intervention.  The pigtail catheter was pulled back  to the suprarenal abdominal aorta.  Abdominal aortography was performed by  power injection.   Diagnostic angiography demonstrated a 90-95% stenosis of the proximal LAD.  The patient arrived in the laboratory on eptifibatide.  It had been seven  and a half hours since his last dose of subcutaneous enoxaparin.  As  intervention would be occurring approximately eight hours after his last  dose, additional heparin was administered (40 units/kg).  The ACT was  maintained at greater than 200  seconds.   A CLS3.5 guide was advanced over a wire and engaged in the ostium of the  left main coronary.  A Luge wire was advanced across the lesion and used to  measure lesion length.  The wire was then advanced to the distal LAD without  difficulty.  The lesion was directly stented using a 3.0 x 28 mm Taxus stent  deployed at 16 atmospheres.  After stent deployment the patient complained  of 5/10 chest discomfort.  Angiography demonstrated slow reflow.  TIMI 3  flow was restored with the administration of 600 mcg of intracoronary  adenosine and 200 mcg of intracoronary verapamil.  His chest pain improved.  The distal portion of the lesion was then post dilated using a 3.25 x 18 mm  PowerSail at 16 atmospheres.  The proximal portion of the lesion was post  dilated using a 3.5 x 18 PowerSail at 14 atmospheres.  Final angiogram  demonstrated no residual stenosis, no dissection, and TIMI 3 flow to the  distal vasculature.  The patient was transferred to the holding room in  stable condition.   COMPLICATIONS:  None.   FINDINGS:  1. LV 141/16/28.  EF 45% with apical dyskinesis and anterolateral     hypokinesis.  There was also hypokinesis of the distal portion of the     inferior wall.  2. Left main:  Angiographically normal.  3. LAD:  Large vessel wrapping the apex of the heart and giving rise to a     small diagonal.  There is a 90% stenosis of the proximal vessel covering     two small septal perforators.  This was stented with excellent     angiographic result as described in detail above.  4. Circumflex:  Large, codominant vessel giving rise to two obtuse marginals     and the PDA.  The second obtuse marginal has a 30% ostial stenosis.  5. RCA:  Small, codominant vessel.  It is angiographically normal.  6. Abdominal aortography:  Normal appearing abdominal aorta.  There are     single renal arteries bilaterally.  Both are normal.   IMPRESSION/RECOMMENDATIONS:  Successful stenting  of culprit lesion of  proximal left anterior descending with excellent angiographic result.  The  patient will be continued on Plavix for one year and aspirin indefinitely.  ACE inhibitor will be added to his current beta blocker and Statin.  Eptifibatide will be continued for 18 hours.  Sheath will be discontinued  when the ACT is less than 150 seconds.                                               Salvadore Farber, M.D.    WED/MEDQ  D:  09/03/2003  T:  09/03/2003  Job:  [id number]   cc:   Crowley Bing, M.D.   Laurita Quint, M.D.  [address] [place]  [place] [address]  Fax: [phone]

## 2011-02-27 NOTE — H&P (Signed)
NAME:  Luis Sweeney, Luis Sweeney NO.:  [id number]   MEDICAL RECORD NO.:  [id number]                   PATIENT TYPE:  INP   LOCATION:  2928                                 FACILITY:  [facility]   PHYSICIAN:  Dare Bing, M.D.               DATE OF BIRTH:  1941-08-08   DATE OF ADMISSION:  09/02/2003  DATE OF DISCHARGE:                                HISTORY & PHYSICAL   REFERRING PHYSICIAN:  Dr. Ethelda Chick.   PRIMARY CARE PHYSICIAN:  Dr. Hetty Ely.   HPI:  70 year-old gentleman with no known coronary disease presents  with chest pain.  Luis Sweeney was evaluated by [facility] as an  outpatient in 1999 for chest discomfort.  Workup at that time apparently was  negative.  He did well until the evening prior to admission when he  developed anterior chest aching with low level activity.  The discomfort is  poorly characterized, but radiated through to the back.  It was moderately  intense.  There was associated dyspnea but no nausea nor diaphoresis.  The  patient could not do anything to exacerbate or ameliorate his symptoms.  It  resolved spontaneously over the course of approximately one hour.  Symptoms  recurred early on the morning of admission in a similar pattern and  persisting for approximate one-half hour, prompting presentation to the  emergency department.  When he arrived, he had minimal residual discomfort.  He now feels well.   Cardiovascular risk factors are modest.  There is no history of  hypertension.  He has been told that lipids are acceptable in the past.  He  has no history of diabetes.   PAST MEDICAL HISTORY:  Notable for ulcer disease in the 1980s.  He has had  multiple minor surgeries including a right shoulder rotator cuff repair,  surgery for right wrist problem in the 60s, removal of a spinal cyst in the  60s and left knee arthroscopic surgery in 1995.  He takes no medications  routinely.  He has had adverse reactions in the  past to morphine and  codeine.   SOCIAL HISTORY:  Lives in [place] with his wife.  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  He experiences considerable stress in his occupation.  He also does some part-time farming.  He has a 50 pack year history of  cigarette smoking that was discontinued in 1986.   FAMILY HISTORY:  Mother had hypertension; father died in advanced age, but  had coronary disease.  Four sisters are generally well.   REVIEW OF SYSTEMS:  Notable for the need for corrective lenses, occasional  headache, arthralgias in his knees and shoulders.  All other systems  negative.   PHYSICAL EXAMINATION:  On exam, a pleasant, somewhat overweight gentleman in  no acute distress.  The temperature is 98.6, heart rate 75, respirations 16,  blood pressure 150/85.  O2 saturation 97% on room  air.  HEENT:  Nonicteric sclera.  NECK:  No jugular venous distention.  SKIN:  Ruddy complexion.  HEMATOPOIETIC:  No adenopathy.  ENDOCRINE:  No thyromegaly; gravelly voice.  CARDIAC:  Normal first and second heart sounds, fourth heart sound present.  LUNGS:  Left basilar rales that clear with cough.  ABDOMEN:  Soft and nontender; no organomegaly; normal bowel sounds.  EXTREMITIES:  Distal pulses intact; no edema.  NEUROMUSCULAR:  Symmetric strength and tone; normal cranial nerves.  MUSCULOSKELETAL:  Scar over the right shoulder; full range of motion.   EKG:  Sinus rhythm; shallow inferior T-wave inversions; slightly more  prominent anterolateral T-wave inversions.  No prior tracing for comparison.   Initial cardiac markers show elevated MB, normal total CK and elevated  troponin to 1.8.   IMPRESSION:  Luis Sweeney presents with worrisome symptoms, EKG abnormalities of  uncertain age and elevated cardiac markers.  The high likelihood of coronary  disease and the fact that he appears to be suffering slight myocardial  damage was explained to him and his wife.  The risks and benefits of  coronary  angiography were explained.  He agrees to proceed, and the  procedure will be scheduled.   Initial treatment will be with low molecular weight heparin, intravenous  nitroglycerin, aspirin and metoprolol.  Lipid profile will be assessed -  since he likely will need pharmacologic therapy.                                                [place] Bing, M.D.    RR/MEDQ  D:  09/03/2003  T:  09/03/2003  Job:  [id number]

## 2011-02-27 NOTE — Discharge Summary (Signed)
NAME:  Luis Sweeney, Luis Sweeney NO.:  [id number]   MEDICAL RECORD NO.:  [id number]                   PATIENT TYPE:  INP   LOCATION:  2031                                 FACILITY:  [facility]   PHYSICIAN:  Cecil Cranker, M.D.             DATE OF BIRTH:  [birth year]   DATE OF ADMISSION:  09/02/2003  DATE OF DISCHARGE:  09/05/2003                                 DISCHARGE SUMMARY   PROCEDURE:  1. Cardiac catheterization.  2. Coronary arteriogram.  3. Left ventriculogram.  4. Percutaneous transluminal coronary angioplasty and stent to one vessel.   HOSPITAL COURSE:  The patient is a 70 year old male with no known history of  coronary artery disease.  He had chest discomfort in 1999 and had a negative  work up by [facility].  On the evening prior to admission he  developed anterior chest aching that was moderately intense and had  associated dyspnea.  It lasted about one hour and recurred on the day of  admission.  He presented to the emergency room and was admitted for further  evaluation and treatment.   His enzymes were elevated indicating a myocardial infarction and he was  scheduled for a cardiac catheterization which was performed on September 03, 2003.  The left anterior descending had a mid 90% lesion treated with Taxus  stent reducing that to 0.  There was no other critical disease and his  ejection fraction was 45% with apical dyskinesis and apical hypokinesis.  He  was placed on aspirin indefinitely and Plavix for a year.  An ACE inhibitor  was added to his medication regimen as well.   His systolic blood pressure could not tolerate both an ACE inhibitor and a  beta blocker so the beta blocker was discontinued.  He was continued on an  ACE inhibitor, Altace 2.5 mg daily and tolerated this well with a systolic  blood pressure generally between 100 and 110.  Additionally he had a lipid  profile performed which showed total cholesterol 157,  triglycerides 98, HDL  34, LDL 103 so he had a statin added to his medication regimen as well.  There is a remote history of possible peptic ulcer disease for which he has  had an upper and lower gastrointestinal evaluation.  Because of this he was  placed on Protonix 40 mg a day as well.   The patient was seen by cardiac rehabilitation and ambulated without chest  pain or shortness of breath.  He is referred for outpatient cardiac  rehabilitation and was given instructions on activity restrictions, cardiac  risk factor modifications and proper use of sublingual nitroglycerin  including other things.  He tolerated the exercise well.   By September 05, 2003 the patient was ambulating without chest pain or  shortness of breath.  His vital signs were within normal limits and he was  considered stable for discharge on September 05, 2003 with outpatient follow  up arranged.   CONDITION ON DISCHARGE:  Improved.   DISCHARGE DIAGNOSES:  1. Anterior myocardial infarction, status post Taxus stent to the left     anterior descending this admission.  2. Left ventricular dysfunction with ejection fraction of 45% by     catheterization this admission.  3. Hyperlipidemia.  4. Possible remote history of ulcer disease.  5. Status post wrist surgeries, spinal cyst removal and arthroscopic knee     surgery.  6. ALLERGIES TO MORPHINE and CODEINE.   DISCHARGE INSTRUCTIONS:  1. His activity level is to include no driving for five days, no strenuous     activity until cleared by his physician.  2. Patient is to call the office for any problems with the catheterization     site.  3. He is to stick to a diet that is low in salt, fat and cholesterol.  4. He is to follow up with Dr. Samule Ohm on September 24, 2003 at noon and with     Dr. Laurita Quint as scheduled.   DISCHARGE MEDICATIONS:  1. Plavix 75 mg p.o. daily.  2. Aspirin 325 mg daily.  3. Zocor 40 mg daily.  4. Nitroglycerin PRN.  5. Altace  2.5 mg daily.  6. Protonix 40 mg daily or Pepcid or Prilosec over the counter.      Lavella Hammock, P.A. [facility]                  E. Graceann Congress, M.D.    RG/MEDQ  D:  09/05/2003  T:  09/05/2003  Job:  [id number]   cc:   Laurita Quint, M.D.  [address] [place]  [place] [address]  Fax: [phone]   Salvadore Farber, M.D.  [phone] [address]  [address]  [place]  [place] [address]

## 2011-02-27 NOTE — Discharge Summary (Signed)
Luis Sweeney, GUT NO.:  [id number]   MEDICAL RECORD NO.:  [id number]          PATIENT TYPE:  INP   LOCATION:  4707                         FACILITY:  [facility]   PHYSICIAN:  Stacie Glaze, MD    DATE OF BIRTH:  11/10/1940   DATE OF ADMISSION:  05/27/2006  DATE OF DISCHARGE:  05/29/2006                                 DISCHARGE SUMMARY   ADMITTING DIAGNOSES:  1. Abdominal pain.  2. Melena.  3. Gastrointestinal bleed.   DISCHARGE DIAGNOSES:  1. Peptic ulcer gastrointestinal bleed.  2. Anemia secondary to blood loss.  3. Hypertensive cardiovascular disease.   HOSPITAL COURSE:  The patient is a 70 year old white male primary patient of  Dr. Hetty Ely, who was on aspirin and Plavix for known cardiovascular disease  with stents, who presented in the emergency room with upper quadrant pain  and heme-positive black stool with a hemoglobin of 10.  He was admitted to  the General Medicine service, a consultation with Gastroenterology was  obtained, and an EGD was pursued, which revealed an acute duodenal ulcer.  The patient's duodenal ulcer was injected and bleeding hemostasis was  obtained by the endoscopist, Dr. Arlyce Dice.  After his procedure, his  hemoglobin remained stable at 9.5 with q.8h checks.  He complained of some  mild abdominal distention after the procedure from the gas but was passing  gas and was stable at the time of his discharge home.  He is discharged to  home on his prior medications except that we will hold the Plavix and  aspirin for two weeks at which time he will resume per the instructions of  his cardiologist.  He will continue Protonix 40 mg p.o. daily per the  instructions of the gastroenterologist; this will be a lifelong  prescription.  He will follow up with his primary care physician, Dr. Laurita Quint, at [facility].           ______________________________  Stacie Glaze, MD     JEJ/MEDQ  D:  05/29/2006  T:  05/29/2006   Job:  [id number]   cc:   Arta Silence, MD

## 2011-04-11 ENCOUNTER — Encounter: Payer: Self-pay | Admitting: Family Medicine

## 2011-04-14 ENCOUNTER — Other Ambulatory Visit: Payer: Self-pay | Admitting: Family Medicine

## 2011-04-14 DIAGNOSIS — Z125 Encounter for screening for malignant neoplasm of prostate: Secondary | ICD-10-CM

## 2011-04-14 DIAGNOSIS — N259 Disorder resulting from impaired renal tubular function, unspecified: Secondary | ICD-10-CM

## 2011-04-14 DIAGNOSIS — E7800 Pure hypercholesterolemia, unspecified: Secondary | ICD-10-CM

## 2011-04-16 ENCOUNTER — Other Ambulatory Visit (INDEPENDENT_AMBULATORY_CARE_PROVIDER_SITE_OTHER): Payer: Medicare Other | Admitting: Family Medicine

## 2011-04-16 DIAGNOSIS — N259 Disorder resulting from impaired renal tubular function, unspecified: Secondary | ICD-10-CM

## 2011-04-16 DIAGNOSIS — E7800 Pure hypercholesterolemia, unspecified: Secondary | ICD-10-CM

## 2011-04-16 DIAGNOSIS — Z125 Encounter for screening for malignant neoplasm of prostate: Secondary | ICD-10-CM

## 2011-04-16 LAB — CBC WITH DIFFERENTIAL/PLATELET
Basophils Absolute: 0 10*3/uL (ref 0.0–0.1)
Basophils Relative: 0.3 % (ref 0.0–3.0)
Eosinophils Absolute: 0 10*3/uL (ref 0.0–0.7)
Eosinophils Relative: 0.7 % (ref 0.0–5.0)
HCT: 41.8 % (ref 39.0–52.0)
Hemoglobin: 14 g/dL (ref 13.0–17.0)
Lymphocytes Relative: 27.9 % (ref 12.0–46.0)
Lymphs Abs: 1.9 10*3/uL (ref 0.7–4.0)
MCHC: 33.5 g/dL (ref 30.0–36.0)
MCV: 93.4 fl (ref 78.0–100.0)
Monocytes Absolute: 0.5 10*3/uL (ref 0.1–1.0)
Monocytes Relative: 7.7 % (ref 3.0–12.0)
Neutro Abs: 4.2 10*3/uL (ref 1.4–7.7)
Neutrophils Relative %: 63.4 % (ref 43.0–77.0)
Platelets: 187 10*3/uL (ref 150.0–400.0)
RBC: 4.48 Mil/uL (ref 4.22–5.81)
RDW: 14.3 % (ref 11.5–14.6)
WBC: 6.7 10*3/uL (ref 4.5–10.5)

## 2011-04-16 LAB — COMPREHENSIVE METABOLIC PANEL
ALT: 19 U/L (ref 0–53)
AST: 23 U/L (ref 0–37)
Albumin: 3.9 g/dL (ref 3.5–5.2)
Alkaline Phosphatase: 31 U/L — ABNORMAL LOW (ref 39–117)
BUN: 25 mg/dL — ABNORMAL HIGH (ref 6–23)
CO2: 30 mEq/L (ref 19–32)
Calcium: 8.9 mg/dL (ref 8.4–10.5)
Chloride: 103 mEq/L (ref 96–112)
Creatinine, Ser: 1.7 mg/dL — ABNORMAL HIGH (ref 0.4–1.5)
GFR: 41.94 mL/min — ABNORMAL LOW (ref >60.00)
Glucose, Bld: 107 mg/dL — ABNORMAL HIGH (ref 70–99)
Potassium: 4.7 mEq/L (ref 3.5–5.1)
Sodium: 136 mEq/L (ref 135–145)
Total Bilirubin: 0.5 mg/dL (ref 0.3–1.2)
Total Protein: 6.2 g/dL (ref 6.0–8.3)

## 2011-04-16 LAB — LIPID PANEL
Cholesterol: 106 mg/dL (ref 0–200)
HDL: 31.4 mg/dL — ABNORMAL LOW (ref >39.00)
LDL Cholesterol: 55 mg/dL (ref 0–99)
Total CHOL/HDL Ratio: 3
Triglycerides: 99 mg/dL (ref 0.0–149.0)
VLDL: 19.8 mg/dL (ref 0.0–40.0)

## 2011-04-16 LAB — TSH: TSH: 2.1 u[IU]/mL (ref 0.35–5.50)

## 2011-04-16 LAB — PSA: PSA: 0.97 ng/mL (ref 0.10–4.00)

## 2011-04-23 ENCOUNTER — Ambulatory Visit (INDEPENDENT_AMBULATORY_CARE_PROVIDER_SITE_OTHER): Payer: Medicare Other | Admitting: Family Medicine

## 2011-04-23 ENCOUNTER — Encounter: Payer: Self-pay | Admitting: Family Medicine

## 2011-04-23 DIAGNOSIS — K5730 Diverticulosis of large intestine without perforation or abscess without bleeding: Secondary | ICD-10-CM

## 2011-04-23 DIAGNOSIS — R1011 Right upper quadrant pain: Secondary | ICD-10-CM

## 2011-04-23 DIAGNOSIS — E7800 Pure hypercholesterolemia, unspecified: Secondary | ICD-10-CM

## 2011-04-23 DIAGNOSIS — N259 Disorder resulting from impaired renal tubular function, unspecified: Secondary | ICD-10-CM

## 2011-04-23 DIAGNOSIS — D509 Iron deficiency anemia, unspecified: Secondary | ICD-10-CM

## 2011-04-23 DIAGNOSIS — I2510 Atherosclerotic heart disease of native coronary artery without angina pectoris: Secondary | ICD-10-CM

## 2011-04-23 DIAGNOSIS — K219 Gastro-esophageal reflux disease without esophagitis: Secondary | ICD-10-CM

## 2011-04-23 DIAGNOSIS — R7989 Other specified abnormal findings of blood chemistry: Secondary | ICD-10-CM

## 2011-04-23 NOTE — Progress Notes (Signed)
Subjective:    Patient ID: Luis Sweeney, male    DOB: [birth year], 69 y.o.   MRN: [id number]  HPI Pt here for annual Comp Exam. He has been evaluated by Dr Leone Payor after being seen in the ER for severe abd pain, w/u signif for gastritis and put on Pantoprazole. This has helped his abd well.  He is stressed at work and expects no relief for a few weeks. He has a few university jobs necessitating long hours. He has had a few episodes of chest pain for 2-3 mins he thinks is anxiety and I concur.    Review of Systems  Constitutional: Negative for fever, chills, diaphoresis, appetite change, fatigue and unexpected weight change.       Has lost 11 pounds by working out, none in last six weeks.   HENT: Negative for hearing loss, ear pain, tinnitus and ear discharge.   Eyes: Negative for pain, discharge and visual disturbance.       Rare watery eyes.  Respiratory: Negative for cough, shortness of breath and wheezing.   Cardiovascular: Negative for palpitations. Chest pain: fleeting arely, see HPI.       No SOB w/ exertion  Gastrointestinal: Negative for nausea, vomiting, abdominal pain, diarrhea and blood in stool. Constipation: rare and fleeting, rare blood with flare.        No heartburn or swallowing problems.  Genitourinary: Negative for dysuria, frequency and difficulty urinating.       No nocturia  Musculoskeletal: Positive for arthralgias (occas wrist pain). Negative for myalgias and back pain.  Skin: Negative for rash.       No itching or dryness.  Neurological: Negative for tremors and numbness.       No tingling or balance problems.  Hematological: Negative for adenopathy. Does not bruise/bleed easily.  Psychiatric/Behavioral: Negative for dysphoric mood and agitation.   Allergies  Allergen Reactions  . Codeine     REACTION: abd. pain  . Morphine     REACTION: hives  . Simvastatin     Current Outpatient Prescriptions on File Prior to Visit  Medication Sig Dispense Refill  .  aspirin EC 81 MG tablet Take 81 mg by mouth daily.        . Cholecalciferol (VITAMIN D-3) 5000 UNITS TABS Take by mouth daily.        . pantoprazole (PROTONIX) 40 MG tablet Take 40 mg by mouth daily before breakfast.        . ramipril (ALTACE) 5 MG capsule TAKE 1 CAPSULE TWICE A DAY  90 capsule  2  . rosuvastatin (CRESTOR) 5 MG tablet Take 5 mg by mouth daily.         Past Medical History  Diagnosis Date  . CAD (coronary artery disease)     treated with drug-eluting stent placement to the proximal left anterior descending in late 2004  . Renal insufficiency   . Hypercholesteremia   . Urticaria     Lat left arm  . Anemia, iron deficiency   . Helicobacter pylori gastritis     recurrent  . Personal history of colonic polyps   . Acute duodenal ulcer with hemorrhage but without obstruction   . GERD (gastroesophageal reflux disease)   . Hyperglycemia   . Myocardial infarct   . Erb's palsy     history of  . Diverticulosis of colon    History   Social History  . Marital Status: Married    Spouse Name: N/A    Number of  Children: 3  . Years of Education: N/A   Occupational History  . [employer]     metal Fabrication/Stairs/Rails commerial   Social History Main Topics  . Smoking status: Former Smoker -- 1.5 packs/day for 3 years    Quit date: 02/16/1985  . Smokeless tobacco: Never Used  . Alcohol Use: 0.0 oz/week    0 drink(s) per week     two to three times a year  . Drug Use: No  . Sexually Active: No     last few years.   Other Topics Concern  . Not on file   Social History Narrative   Daily caffeine use 3 per day   Family History  Problem Relation Age of Onset  . Diabetes Father   . Heart disease Father     CHF  . Kidney disease Father     kidney failure  . Diabetes Sister   . Hypertension Sister   . Cancer Sister     pelvic mass  . Hypertension Sister   . Arthritis Sister   . Cancer Other     ovarian      BP 110/68  Pulse 84  Temp(Src) 98.7  F (37.1 C) (Oral)  Ht 6\' 1"  (1.854 m)  Wt 250 lb 4 oz (113.513 kg)  BMI 33.02 kg/m2  Objective:   Physical Exam  Constitutional: He is oriented to person, place, and time. He appears well-developed and well-nourished. No distress.  HENT:  Head: Normocephalic and atraumatic.  Right Ear: External ear normal.  Left Ear: External ear normal.  Nose: Nose normal.  Mouth/Throat: Oropharynx is clear and moist.  Eyes: Conjunctivae and EOM are normal. Pupils are equal, round, and reactive to light. Right eye exhibits no discharge. Left eye exhibits no discharge. No scleral icterus.  Neck: Normal range of motion. Neck supple. No thyromegaly present.  Cardiovascular: Normal rate, regular rhythm, normal heart sounds and intact distal pulses.   No murmur heard. Pulmonary/Chest: Effort normal and breath sounds normal. No respiratory distress. He has no wheezes.  Abdominal: Soft. Bowel sounds are normal. He exhibits no distension and no mass. There is no tenderness. There is no rebound and no guarding.  Genitourinary: Rectum normal and penis normal.       No hernias felt.  Musculoskeletal: Normal range of motion. He exhibits no edema.       Atrophic, somewhat immobile flexed right arm and hand resulting from Erb's Palsy.  Lymphadenopathy:    He has no cervical adenopathy.  Neurological: He is alert and oriented to person, place, and time. Coordination normal.  Skin: Skin is warm and dry. No rash noted. He is not diaphoretic.  Psychiatric: He has a normal mood and affect. His behavior is normal. Judgment and thought content normal.          Assessment & Plan:  HMPE  I have personally reviewed the Medicare Annual Wellness questionnaire and have noted 1. The patient's medical and social history 2. Their use of alcohol, tobacco or illicit drugs 3. Their current medications and supplements 4. The patient's functional ability including ADL's, fall risks, home safety risks and hearing or visual              impairment. 5. Diet and physical activities 6. Evidence for depression or mood disorders

## 2011-04-23 NOTE — Assessment & Plan Note (Signed)
Great nos except HDL slightly low which should improve with increased exercise when he gets back to it. Lab Results  Component Value Date   CHOL 106 04/16/2011   CHOL 91 06/10/2009   CHOL 97 01/04/2008   Lab Results  Component Value Date   HDL 31.40* 04/16/2011   HDL 16.10* 06/10/2009   HDL 29.0* 01/04/2008   Lab Results  Component Value Date   LDLCALC 55 04/16/2011   LDLCALC 46 06/10/2009   LDLCALC 57 01/04/2008   Lab Results  Component Value Date   TRIG 99.0 04/16/2011   TRIG 63.0 06/10/2009   TRIG 57 01/04/2008   Lab Results  Component Value Date   CHOLHDL 3 04/16/2011   CHOLHDL 3 06/10/2009   CHOLHDL 3.3 CALC 01/04/2008   No results found for this basename: LDLDIRECT

## 2011-04-23 NOTE — Patient Instructions (Addendum)
Get Bmet prior to Dr Cooper's appt. Tdap today.

## 2011-04-23 NOTE — Assessment & Plan Note (Signed)
Continues at mild level. Discussed continued diligence in avoiding sweets and carbs. Glu today 107.

## 2011-04-23 NOTE — Assessment & Plan Note (Signed)
Has worsened slightly Cr 1.7 from 1.6 in 3/09. Will cont with Ramipril for now. May need Renal referral if continues to worsen.

## 2011-04-23 NOTE — Assessment & Plan Note (Signed)
Back to nml.  Lab Results  Component Value Date   WBC 6.7 04/16/2011   HGB 14.0 04/16/2011   HCT 41.8 04/16/2011   MCV 93.4 04/16/2011   PLT 187.0 04/16/2011

## 2011-04-23 NOTE — Assessment & Plan Note (Signed)
Seems stable. Chol good, BP good, weight coming down.

## 2011-04-23 NOTE — Assessment & Plan Note (Signed)
Stable

## 2011-04-23 NOTE — Assessment & Plan Note (Signed)
Seems resolved on Pantoprazole.

## 2011-04-23 NOTE — Assessment & Plan Note (Signed)
Discussed being seen for p[rolonged LLQ discomfort. 

## 2011-04-23 NOTE — Assessment & Plan Note (Signed)
On Pantoprazole with good results.

## 2011-05-18 ENCOUNTER — Encounter: Payer: Self-pay | Admitting: Cardiovascular Disease

## 2011-06-04 ENCOUNTER — Other Ambulatory Visit: Payer: Self-pay | Admitting: Family Medicine

## 2011-06-04 DIAGNOSIS — N259 Disorder resulting from impaired renal tubular function, unspecified: Secondary | ICD-10-CM

## 2011-06-04 DIAGNOSIS — R7989 Other specified abnormal findings of blood chemistry: Secondary | ICD-10-CM

## 2011-06-09 ENCOUNTER — Other Ambulatory Visit (INDEPENDENT_AMBULATORY_CARE_PROVIDER_SITE_OTHER): Payer: Medicare Other

## 2011-06-09 DIAGNOSIS — N259 Disorder resulting from impaired renal tubular function, unspecified: Secondary | ICD-10-CM

## 2011-06-09 DIAGNOSIS — R7989 Other specified abnormal findings of blood chemistry: Secondary | ICD-10-CM

## 2011-06-09 LAB — BASIC METABOLIC PANEL
BUN: 26 mg/dL — ABNORMAL HIGH (ref 6–23)
CO2: 29 mEq/L (ref 19–32)
Calcium: 9.1 mg/dL (ref 8.4–10.5)
Chloride: 104 mEq/L (ref 96–112)
Creatinine, Ser: 1.5 mg/dL (ref 0.4–1.5)
GFR: 48.72 mL/min — ABNORMAL LOW (ref >60.00)
Glucose, Bld: 105 mg/dL — ABNORMAL HIGH (ref 70–99)
Potassium: 4.4 mEq/L (ref 3.5–5.1)
Sodium: 139 mEq/L (ref 135–145)

## 2011-06-16 ENCOUNTER — Encounter: Payer: Self-pay | Admitting: Cardiovascular Disease

## 2011-06-16 ENCOUNTER — Ambulatory Visit (INDEPENDENT_AMBULATORY_CARE_PROVIDER_SITE_OTHER): Payer: Medicare Other | Admitting: Cardiovascular Disease

## 2011-06-16 DIAGNOSIS — I2510 Atherosclerotic heart disease of native coronary artery without angina pectoris: Secondary | ICD-10-CM

## 2011-06-16 DIAGNOSIS — E7800 Pure hypercholesterolemia, unspecified: Secondary | ICD-10-CM

## 2011-06-16 NOTE — Assessment & Plan Note (Addendum)
The patient is stable with atypical chest pain. I have asked him to continue to observe the pattern of this. If he notes increased frequency or severity of symptoms we'll plan on stress testing.  Otherwise we will continue his current medical program without changes.

## 2011-06-16 NOTE — Patient Instructions (Signed)
Your physician wants you to follow-up in:  12 months.  You will receive a reminder letter in the mail two months in advance. If you don't receive a letter, please call our office to schedule the follow-up appointment.   

## 2011-06-16 NOTE — Assessment & Plan Note (Signed)
Recent lipids reviewed showing a total cholesterol of 106, HDL 31, and LDL 55. He will continue on statin therapy. His low HDL cholesterol be treated with medication as the studies in this area have not demonstrated clinical benefit.

## 2011-06-16 NOTE — Progress Notes (Signed)
HPI:  This is a 70 year old gentleman presenting for followup evaluation. The patient has coronary artery disease and was treated with a drug-eluting stent in the LAD in 2004. He's had no recurrent ischemic events. He underwent a stress Myoview in 2010 that showed no ischemia.  The patient complains of increased stress at work. He had an episode of sharp right-sided substernal chest pain last week. This was self-limited and lasted about 5 minutes. He attributes this to stress. He's had a few similar episodes over the past several months, all of them self-limited and short in duration. The patient has started exercising again. He denies exertional chest pain or pressure. He denies dyspnea, edema, palpitations, or lightheadedness.  Outpatient Encounter Prescriptions as of 06/16/2011  Medication Sig Dispense Refill  . aspirin EC 81 MG tablet Take 81 mg by mouth daily.        . Cholecalciferol (VITAMIN D-3) 5000 UNITS TABS Take by mouth daily.        . ramipril (ALTACE) 5 MG capsule TAKE 1 CAPSULE TWICE A DAY  90 capsule  2  . rosuvastatin (CRESTOR) 5 MG tablet Take 5 mg by mouth daily.        Marland Kitchen HYDROcodone-acetaminophen (NORCO) 5-325 MG per tablet Take 1 tablet by mouth every 4 (four) hours as needed.        . pantoprazole (PROTONIX) 40 MG tablet Take 40 mg by mouth daily before breakfast.          Allergies  Allergen Reactions  . Codeine     REACTION: abd. pain  . Morphine     REACTION: hives  . Simvastatin     Past Medical History  Diagnosis Date  . CAD (coronary artery disease)     treated with drug-eluting stent placement to the proximal left anterior descending in late 2004  . Renal insufficiency   . Hypercholesteremia   . Urticaria     Lat left arm  . Anemia, iron deficiency   . Helicobacter pylori gastritis     recurrent  . Personal history of colonic polyps   . Acute duodenal ulcer with hemorrhage but without obstruction   . GERD (gastroesophageal reflux disease)   .  Hyperglycemia   . Myocardial infarct   . Erb's palsy     history of  . Diverticulosis of colon     ROS: Negative except as per HPI  BP 132/68  Pulse 84  Ht 6\' 2"  (1.88 m)  Wt 255 lb (115.667 kg)  BMI 32.74 kg/m2  PHYSICAL EXAM: Pt is alert and oriented, overweight male in NAD HEENT: normal Neck: JVP - normal, carotids 2+= without bruits Lungs: CTA bilaterally CV: RRR without murmur or gallop Abd: soft, NT, Positive BS, no hepatomegaly Ext: no C/C/E, distal pulses intact and equal Skin: warm/dry no rash  EKG:  Normal sinus rhythm 72 beats per minute, nonspecific ST abnormality.  ASSESSMENT AND PLAN:

## 2011-06-17 ENCOUNTER — Other Ambulatory Visit: Payer: Self-pay | Admitting: Cardiovascular Disease

## 2011-07-17 LAB — I-STAT 8, (EC8 V) (CONVERTED LAB)
Acid-base deficit: 4 — ABNORMAL HIGH
BUN: 51 — ABNORMAL HIGH
Bicarbonate: 23.3
Chloride: 106
Glucose, Bld: 113 — ABNORMAL HIGH
HCT: 30 — ABNORMAL LOW
Hemoglobin: 10.2 — ABNORMAL LOW
Operator id: 151321
Potassium: 4.6
Sodium: 136
TCO2: 25
pCO2, Ven: 49.3
pH, Ven: 7.282

## 2011-07-17 LAB — TYPE AND SCREEN
ABO/RH(D): A POS
Antibody Screen: NEGATIVE

## 2011-07-17 LAB — DIFFERENTIAL
Basophils Absolute: 0
Basophils Relative: 0
Eosinophils Absolute: 0 — ABNORMAL LOW
Eosinophils Relative: 0
Lymphocytes Relative: 13
Lymphs Abs: 1.7
Monocytes Absolute: 0.7
Monocytes Relative: 5
Neutro Abs: 11 — ABNORMAL HIGH
Neutrophils Relative %: 82 — ABNORMAL HIGH

## 2011-07-17 LAB — HEMOGLOBIN AND HEMATOCRIT, BLOOD
HCT: 21.1 — ABNORMAL LOW
HCT: 23.6 — ABNORMAL LOW
HCT: 25.2 — ABNORMAL LOW
HCT: 28.3 — ABNORMAL LOW
Hemoglobin: 7.5 — CL
Hemoglobin: 7.9 — CL
Hemoglobin: 8.8 — ABNORMAL LOW
Hemoglobin: 9.5 — ABNORMAL LOW

## 2011-07-17 LAB — CBC
HCT: 28.8 — ABNORMAL LOW
Hemoglobin: 9.7 — ABNORMAL LOW
MCHC: 33.5
MCV: 89.4
Platelets: 255
RBC: 3.23 — ABNORMAL LOW
RDW: 14.7
WBC: 13.4 — ABNORMAL HIGH

## 2011-07-17 LAB — ABO/RH: ABO/RH(D): A POS

## 2011-07-17 LAB — POCT I-STAT CREATININE
Creatinine, Ser: 1.7 — ABNORMAL HIGH
Operator id: 151321

## 2011-07-17 LAB — PREPARE RBC (CROSSMATCH)

## 2011-07-17 LAB — HEMOGLOBIN
Hemoglobin: 7 — CL
Hemoglobin: 7.6 — CL

## 2011-07-17 LAB — H. PYLORI ANTIBODY, IGG: H Pylori IgG: 5.9 — ABNORMAL HIGH

## 2011-07-17 LAB — OCCULT BLOOD X 1 CARD TO LAB, STOOL: Fecal Occult Bld: POSITIVE

## 2011-07-20 ENCOUNTER — Other Ambulatory Visit: Payer: Self-pay | Admitting: Cardiovascular Disease

## 2011-12-18 ENCOUNTER — Other Ambulatory Visit: Payer: Self-pay | Admitting: Cardiovascular Disease

## 2012-02-09 ENCOUNTER — Ambulatory Visit (INDEPENDENT_AMBULATORY_CARE_PROVIDER_SITE_OTHER): Payer: Medicare Other | Admitting: Family Medicine

## 2012-02-09 ENCOUNTER — Encounter: Payer: Self-pay | Admitting: Family Medicine

## 2012-02-09 VITALS — BP 110/66 | HR 92 | Temp 100.6°F | Wt 266.2 lb

## 2012-02-09 DIAGNOSIS — R300 Dysuria: Secondary | ICD-10-CM

## 2012-02-09 DIAGNOSIS — N390 Urinary tract infection, site not specified: Secondary | ICD-10-CM

## 2012-02-09 LAB — POCT URINALYSIS DIPSTICK
Bilirubin, UA: NEGATIVE
Glucose, UA: NEGATIVE
Ketones, UA: NEGATIVE
Nitrite, UA: POSITIVE
Spec Grav, UA: 1.015
Urobilinogen, UA: 0.2
pH, UA: 7

## 2012-02-09 MED ORDER — CIPROFLOXACIN HCL 500 MG PO TABS: 500.0000 mg | ORAL_TABLET | Freq: Two times a day (BID) | ORAL | Status: AC

## 2012-02-09 NOTE — Patient Instructions (Signed)
You have urinary infection - urine culture sent. Treat with cipro twice daily for 10 days. Update [place] if symptoms not improved after 2-3 days of antibiotics or if any worsening. Push fluids and rest.  Urinary Tract Infection Infections of the urinary tract can start in several places. A bladder infection (cystitis), a kidney infection (pyelonephritis), and a prostate infection (prostatitis) are different types of urinary tract infections (UTIs). They usually get better if treated with medicines (antibiotics) that kill germs. Take all the medicine until it is gone. You or your child may feel better in a few days, but TAKE ALL MEDICINE or the infection may not respond and may become more difficult to treat. HOME CARE INSTRUCTIONS   Drink enough water and fluids to keep the urine clear or pale yellow. Cranberry juice is especially recommended, in addition to large amounts of water.   Avoid caffeine, tea, and carbonated beverages. They tend to irritate the bladder.   Alcohol may irritate the prostate.   Only take over-the-counter or prescription medicines for pain, discomfort, or fever as directed by your caregiver.  To prevent further infections:  Empty the bladder often. Avoid holding urine for long periods of time.   After a bowel movement, women should cleanse from front to back. Use each tissue only once.   Empty the bladder before and after sexual intercourse.  FINDING OUT THE RESULTS OF YOUR TEST Not all test results are available during your visit. If your or your child's test results are not back during the visit, make an appointment with your caregiver to find out the results. Do not assume everything is normal if you have not heard from your caregiver or the medical facility. It is important for you to follow up on all test results. SEEK MEDICAL CARE IF:   There is back pain.   Your baby is older than 3 months with a rectal temperature of 100.5 F (38.1 C) or higher for more than  1 day.   Your or your child's problems (symptoms) are no better in 3 days. Return sooner if you or your child is getting worse.  SEEK IMMEDIATE MEDICAL CARE IF:   There is severe back pain or lower abdominal pain.   You or your child develops chills.   You have a fever.   Your baby is older than 3 months with a rectal temperature of 102 F (38.9 C) or higher.   Your baby is 40 months old or younger with a rectal temperature of 100.4 F (38 C) or higher.   There is nausea or vomiting.   There is continued burning or discomfort with urination.  MAKE SURE YOU:   Understand these instructions.   Will watch your condition.   Will get help right away if you are not doing well or get worse.  Document Released: 07/08/2005 Document Revised: 09/17/2011 Document Reviewed: 02/10/2007 [facility] Patient Information 2012 [place], [place].

## 2012-02-09 NOTE — Assessment & Plan Note (Signed)
UA/micro and story consistent with UTI, anticipate lower tract. Treat with 10 day course of cipro twice daily (prolonged given chills and some back pain). Update [place] if sxs not improving.

## 2012-02-09 NOTE — Progress Notes (Signed)
  Subjective:    Patient ID: Luis Sweeney, male    DOB: 1940-11-27, 71 y.o.   MRN: [id number]  HPI CC: ?UTI  Pleasant 71 yo new to me with h/o CAD presents with:  2 night h/o dysuria, urgency.  Polyuria.  Felt chilled last night.  Mild lower back pain.  Mild right flank pain.  Staying nauseated.  No abd pain.  H/o UTIs in past, similar sxs.  Past Medical History  Diagnosis Date  . CAD (coronary artery disease)     treated with drug-eluting stent placement to the proximal left anterior descending in late 2004  . Renal insufficiency   . Hypercholesteremia   . Urticaria     Lat left arm  . Anemia, iron deficiency   . Helicobacter pylori gastritis     recurrent  . Personal history of colonic polyps   . Acute duodenal ulcer with hemorrhage but without obstruction   . GERD (gastroesophageal reflux disease)   . Hyperglycemia   . Myocardial infarct   . Erb's palsy     history of  . Diverticulosis of colon      Review of Systems Per HPI    Objective:   Physical Exam  Nursing note and vitals reviewed. Constitutional: He appears well-developed and well-nourished. No distress.  Cardiovascular: Normal rate, regular rhythm, normal heart sounds and intact distal pulses.   No murmur heard. Pulmonary/Chest: Effort normal and breath sounds normal. No respiratory distress. He has no wheezes. He has no rales.  Abdominal: Soft. Bowel sounds are normal. He exhibits no distension and no mass. There is no hepatosplenomegaly. There is no tenderness. There is no rebound, no guarding and no CVA tenderness.  Musculoskeletal: He exhibits no edema.  Skin: Skin is warm and dry. No rash noted.  Psychiatric: He has a normal mood and affect.       Assessment & Plan:

## 2012-02-11 LAB — URINE CULTURE: Colony Count: >=100000

## 2012-05-13 ENCOUNTER — Other Ambulatory Visit: Payer: Self-pay | Admitting: Cardiovascular Disease

## 2012-05-20 ENCOUNTER — Other Ambulatory Visit: Payer: Self-pay | Admitting: Family Medicine

## 2012-05-20 DIAGNOSIS — E7800 Pure hypercholesterolemia, unspecified: Secondary | ICD-10-CM

## 2012-05-20 DIAGNOSIS — D509 Iron deficiency anemia, unspecified: Secondary | ICD-10-CM

## 2012-05-20 DIAGNOSIS — E538 Deficiency of other specified B group vitamins: Secondary | ICD-10-CM

## 2012-05-20 DIAGNOSIS — E559 Vitamin D deficiency, unspecified: Secondary | ICD-10-CM

## 2012-05-20 DIAGNOSIS — N259 Disorder resulting from impaired renal tubular function, unspecified: Secondary | ICD-10-CM

## 2012-05-20 DIAGNOSIS — Z125 Encounter for screening for malignant neoplasm of prostate: Secondary | ICD-10-CM

## 2012-05-26 ENCOUNTER — Other Ambulatory Visit (INDEPENDENT_AMBULATORY_CARE_PROVIDER_SITE_OTHER): Payer: Medicare Other

## 2012-05-26 DIAGNOSIS — E538 Deficiency of other specified B group vitamins: Secondary | ICD-10-CM

## 2012-05-26 DIAGNOSIS — Z125 Encounter for screening for malignant neoplasm of prostate: Secondary | ICD-10-CM

## 2012-05-26 DIAGNOSIS — D509 Iron deficiency anemia, unspecified: Secondary | ICD-10-CM

## 2012-05-26 DIAGNOSIS — E7800 Pure hypercholesterolemia, unspecified: Secondary | ICD-10-CM

## 2012-05-26 DIAGNOSIS — E559 Vitamin D deficiency, unspecified: Secondary | ICD-10-CM

## 2012-05-26 LAB — CBC WITH DIFFERENTIAL/PLATELET
Basophils Absolute: 0 10*3/uL (ref 0.0–0.1)
Basophils Relative: 0.3 % (ref 0.0–3.0)
Eosinophils Absolute: 0.1 10*3/uL (ref 0.0–0.7)
Eosinophils Relative: 1.4 % (ref 0.0–5.0)
HCT: 42 % (ref 39.0–52.0)
Hemoglobin: 13.7 g/dL (ref 13.0–17.0)
Lymphocytes Relative: 26 % (ref 12.0–46.0)
Lymphs Abs: 1.8 10*3/uL (ref 0.7–4.0)
MCHC: 32.7 g/dL (ref 30.0–36.0)
MCV: 91.5 fl (ref 78.0–100.0)
Monocytes Absolute: 0.6 10*3/uL (ref 0.1–1.0)
Monocytes Relative: 9 % (ref 3.0–12.0)
Neutro Abs: 4.4 10*3/uL (ref 1.4–7.7)
Neutrophils Relative %: 63.3 % (ref 43.0–77.0)
Platelets: 193 10*3/uL (ref 150.0–400.0)
RBC: 4.59 Mil/uL (ref 4.22–5.81)
RDW: 14.8 % — ABNORMAL HIGH (ref 11.5–14.6)
WBC: 6.9 10*3/uL (ref 4.5–10.5)

## 2012-05-26 LAB — LIPID PANEL
Cholesterol: 106 mg/dL (ref 0–200)
HDL: 32.9 mg/dL — ABNORMAL LOW (ref >39.00)
LDL Cholesterol: 60 mg/dL (ref 0–99)
Total CHOL/HDL Ratio: 3
Triglycerides: 65 mg/dL (ref 0.0–149.0)
VLDL: 13 mg/dL (ref 0.0–40.0)

## 2012-05-26 LAB — COMPREHENSIVE METABOLIC PANEL
ALT: 18 U/L (ref 0–53)
AST: 22 U/L (ref 0–37)
Albumin: 3.7 g/dL (ref 3.5–5.2)
Alkaline Phosphatase: 28 U/L — ABNORMAL LOW (ref 39–117)
BUN: 24 mg/dL — ABNORMAL HIGH (ref 6–23)
CO2: 30 mEq/L (ref 19–32)
Calcium: 9.2 mg/dL (ref 8.4–10.5)
Chloride: 104 mEq/L (ref 96–112)
Creatinine, Ser: 1.6 mg/dL — ABNORMAL HIGH (ref 0.4–1.5)
GFR: 46.79 mL/min — ABNORMAL LOW (ref >60.00)
Glucose, Bld: 98 mg/dL (ref 70–99)
Potassium: 4.9 mEq/L (ref 3.5–5.1)
Sodium: 139 mEq/L (ref 135–145)
Total Bilirubin: 0.4 mg/dL (ref 0.3–1.2)
Total Protein: 6.3 g/dL (ref 6.0–8.3)

## 2012-05-26 LAB — PSA, MEDICARE: PSA: 1.31 ng/ml (ref 0.10–4.00)

## 2012-05-26 LAB — VITAMIN B12: Vitamin B-12: 167 pg/mL — ABNORMAL LOW (ref 211–911)

## 2012-05-27 LAB — VITAMIN D 25 HYDROXY (VIT D DEFICIENCY, FRACTURES): Vit D, 25-Hydroxy: 74 ng/mL (ref 30–89)

## 2012-05-31 ENCOUNTER — Encounter: Payer: Self-pay | Admitting: Family Medicine

## 2012-05-31 ENCOUNTER — Ambulatory Visit (INDEPENDENT_AMBULATORY_CARE_PROVIDER_SITE_OTHER): Payer: Medicare Other | Admitting: Family Medicine

## 2012-05-31 VITALS — BP 112/60 | HR 50 | Temp 97.9°F | Ht 72.0 in | Wt 263.0 lb

## 2012-05-31 DIAGNOSIS — E538 Deficiency of other specified B group vitamins: Secondary | ICD-10-CM | POA: Insufficient documentation

## 2012-05-31 DIAGNOSIS — D509 Iron deficiency anemia, unspecified: Secondary | ICD-10-CM

## 2012-05-31 DIAGNOSIS — I2510 Atherosclerotic heart disease of native coronary artery without angina pectoris: Secondary | ICD-10-CM

## 2012-05-31 DIAGNOSIS — K4091 Unilateral inguinal hernia, without obstruction or gangrene, recurrent: Secondary | ICD-10-CM | POA: Insufficient documentation

## 2012-05-31 DIAGNOSIS — R7989 Other specified abnormal findings of blood chemistry: Secondary | ICD-10-CM

## 2012-05-31 DIAGNOSIS — N259 Disorder resulting from impaired renal tubular function, unspecified: Secondary | ICD-10-CM

## 2012-05-31 DIAGNOSIS — Z23 Encounter for immunization: Secondary | ICD-10-CM

## 2012-05-31 DIAGNOSIS — K4090 Unilateral inguinal hernia, without obstruction or gangrene, not specified as recurrent: Secondary | ICD-10-CM

## 2012-05-31 DIAGNOSIS — E7800 Pure hypercholesterolemia, unspecified: Secondary | ICD-10-CM

## 2012-05-31 DIAGNOSIS — Z0000 Encounter for general adult medical examination without abnormal findings: Secondary | ICD-10-CM | POA: Insufficient documentation

## 2012-05-31 MED ORDER — CYANOCOBALAMIN 1000 MCG/ML IJ SOLN
1000.0000 ug | Freq: Once | INTRAMUSCULAR | Status: AC
  Administered 2012-05-31: 1000 ug via INTRAMUSCULAR

## 2012-05-31 NOTE — Progress Notes (Signed)
Subjective:    Patient ID: Luis Sweeney, male    DOB: [birth year], 71 y.o.   MRN: [id number]  HPI CC: medicare wellness visit  No concerns or questions.  H/o erb's palsy, right hand.  Wt Readings from Last 3 Encounters:  05/31/12 263 lb (119.296 kg)  02/09/12 266 lb 4 oz (120.77 kg)  06/16/11 255 lb (115.667 kg)  fell off wagon regarding exercise (prior was going to gym, planning on restarting).  HLD - stable on meds CAD - h/o MI 2004.  S/p stent.  Occasional right sided sharp chest pain but does not feel like when had prior MI.  Deemed atypical by cards. Vit B12 def - found on recent testing.  Recommend vit b12 shots monthly. H/o R hernia repair 2006, now feeling more pain at site for last several months.  May see Dr. Daphine Deutscher but wants to lose weight first.  Daily caffeine use 3 per day Lives with wife  Activity: no regular exercise Diet: tries to avoid sweets, some water, fruits/vegetables occasionally  Preventative: Last physical was 1 yr ago with Dr. Hetty Ely Colonoscopy - 11/2007 Leone Payor) diverticulosis, int hem, h/o adenomatous polyps, rec rpt 2014. Prostate cancer screening - no problems in past.  Nocturia x1 per night.  Strong stream. Td - 2002.  Would like today. Pneumovax - 2009. Discussed shingles shot - will think about this. Flu yearly.  Denies falls in last year.   Denies depression, anhedonia.  Some business stress.  Medications and allergies reviewed and updated in chart.  Past histories reviewed and updated if relevant as below. Patient Active Problem List  Diagnosis  . HYPERCHOLESTEROLEMIA  . ANEMIA, IRON DEFICIENCY  . MYOCARDIAL INFARCTION, HX OF  . Coronary atherosclerosis of native coronary artery  . GERD  . DIVERTICULOSIS, COLON  . RENAL INSUFFICIENCY  . UTI  . ERB'S PALSY  . OTHER DYSPNEA AND RESPIRATORY ABNORMALITIES  . HYPERGLYCEMIA  . PERSONAL HISTORY OF COLONIC POLYPS  . WRIST PAIN, LEFT  . RUQ PAIN  . PERSONAL HISTORY OF PEPTIC ULCER  DISEASE  . HELICOBACTER PYLORI GASTRITIS, RECURRENT   Past Medical History  Diagnosis Date  . CAD (coronary artery disease) 2004    s/p MI treated with drug-eluting stent placement to the proximal left anterior descending in late 2004  . Renal insufficiency   . Hypercholesteremia   . Urticaria     Lat left arm  . Anemia, iron deficiency   . Helicobacter pylori gastritis     recurrent  . Personal history of colonic polyps   . Acute duodenal ulcer with hemorrhage but without obstruction   . GERD (gastroesophageal reflux disease)   . Hyperglycemia   . Myocardial infarct   . Erb's palsy     congenital  . Diverticulosis of colon    Past Surgical History  Procedure Date  . Shoulder surgery     right  . Wrist surgery   . Spinal cyst removal   . Knee surgery     left  . Coronary stent placement     drug-eluting  . Hernia repair     right  . Esophagogastroduodenoscopy 11/26/10    mild gastritis, duodenitis, duodenal stricture (Dr. Leone Payor)   History  Substance Use Topics  . Smoking status: Former Smoker -- 1.5 packs/day for 3 years    Quit date: 02/16/1985  . Smokeless tobacco: Never Used  . Alcohol Use: 0.0 oz/week    0 drink(s) per week     two to three times  a year   Family History  Problem Relation Age of Onset  . Diabetes Father   . Heart disease Father     CHF  . Kidney disease Father     kidney failure  . Heart failure Father   . Diabetes Sister   . Hypertension Sister   . Cancer Sister     pelvic mass  . Hypertension Sister   . Arthritis Sister   . Cancer Other     ovarian   Allergies  Allergen Reactions  . Codeine     REACTION: abd. pain  . Morphine     REACTION: hives  . Simvastatin    Current Outpatient Prescriptions on File Prior to Visit  Medication Sig Dispense Refill  . aspirin EC 81 MG tablet Take 81 mg by mouth daily.        . Cholecalciferol (VITAMIN D-3) 5000 UNITS TABS Take by mouth daily.        . CRESTOR 5 MG tablet TAKE 1  TABLET DAILY  90 tablet  3  . ramipril (ALTACE) 5 MG capsule TAKE 1 CAPSULE TWICE A DAY  180 capsule  3     Review of Systems  Constitutional: Negative for fever, chills, activity change, appetite change, fatigue and unexpected weight change.  HENT: Negative for hearing loss and neck pain.   Eyes: Negative for visual disturbance.  Respiratory: Negative for cough, chest tightness, shortness of breath and wheezing.   Cardiovascular: Negative for chest pain, palpitations and leg swelling.  Gastrointestinal: Negative for nausea, vomiting, abdominal pain (mild reflux occasionally), diarrhea, constipation, blood in stool and abdominal distention.  Genitourinary: Negative for hematuria and difficulty urinating.  Musculoskeletal: Negative for myalgias and arthralgias.  Skin: Negative for rash.  Neurological: Negative for dizziness, seizures, syncope and headaches.  Hematological: Does not bruise/bleed easily.  Psychiatric/Behavioral: Negative for dysphoric mood. The patient is not nervous/anxious.        Objective:   Physical Exam  Nursing note and vitals reviewed. Constitutional: He is oriented to person, place, and time. He appears well-developed and well-nourished. No distress.  HENT:  Head: Normocephalic and atraumatic.  Right Ear: Hearing, tympanic membrane, external ear and ear canal normal.  Left Ear: Hearing, tympanic membrane and external ear normal.  Nose: Nose normal.  Mouth/Throat: Oropharynx is clear and moist. No oropharyngeal exudate.  Eyes: Conjunctivae and EOM are normal. Pupils are equal, round, and reactive to light. No scleral icterus.  Neck: Normal range of motion. Neck supple. Carotid bruit is not present.  Cardiovascular: Normal rate, regular rhythm, normal heart sounds and intact distal pulses.   No murmur heard. Pulses:      Radial pulses are 2+ on the right side, and 2+ on the left side.  Pulmonary/Chest: Effort normal and breath sounds normal. No respiratory  distress. He has no wheezes. He has no rales.  Abdominal: Soft. Bowel sounds are normal. He exhibits no distension and no mass. There is no tenderness. There is no rebound and no guarding. A hernia is present. Hernia confirmed positive in the right inguinal area (tender but reducible). Hernia confirmed negative in the left inguinal area.  Genitourinary: Penis normal.       Rectal deferred  Musculoskeletal: Normal range of motion. He exhibits no edema.       Chronic RUE atrophic, flexed, h/o erb's palsy  Lymphadenopathy:    He has no cervical adenopathy.  Neurological: He is alert and oriented to person, place, and time.  CN grossly intact, station and gait intact  Skin: Skin is warm and dry. No rash noted.  Psychiatric: He has a normal mood and affect. His behavior is normal. Judgment and thought content normal.       Assessment & Plan:

## 2012-05-31 NOTE — Assessment & Plan Note (Signed)
I have personally reviewed the Medicare Annual Wellness questionnaire and have noted 1. The patient's medical and social history 2. Their use of alcohol, tobacco or illicit drugs 3. Their current medications and supplements 4. The patient's functional ability including ADL's, fall risks, home safety risks and hearing or visual impairment. 5. Diet and physical activity 6. Evidence for depression or mood disorders The patients weight, height, BMI have been recorded in the chart.  Hearing and vision has been addressed. I have made referrals, counseling and provided education to the patient based review of the above and I have provided the pt with a written personalized care plan for preventive services. See scanned questionairre.  Reviewed preventative protocols and updated unless pt declined. Td today. UTD colonoscopy.  PSA stable. Discussed healthy diet/lifestyle.

## 2012-05-31 NOTE — Assessment & Plan Note (Signed)
Stable off meds.  Normal fasting levels this year.

## 2012-05-31 NOTE — Assessment & Plan Note (Signed)
H/o hernia s/p repair. Pt prefers to monitor for now, may schedule apt with surgery for f/u if becoming bothersome.

## 2012-05-31 NOTE — Patient Instructions (Signed)
Look into shingles shot.  Call your insurace about the shingles shot to see if it is covered or how much it would cost and where is cheaper (here or pharmacy).  If you want to receive here, call for nurse visit.  Tetanus shot today. First B12 shot today.  Then return every month for 8 months. Good to see you, call us with questions. Return to see me in 1 year for next medicare wellness visit, or sooner as needed. Schedule appointment with Dr. Daphine Deutscher if area becoming more tender.

## 2012-05-31 NOTE — Assessment & Plan Note (Signed)
Recommended start b12 monthly shots for 8 months, then take daily oral supplementation.

## 2012-05-31 NOTE — Assessment & Plan Note (Signed)
Chronic, stable. Continue crestor.  

## 2012-05-31 NOTE — Assessment & Plan Note (Signed)
Chronic, stable. Continue to monitor.  

## 2012-05-31 NOTE — Assessment & Plan Note (Signed)
Discussed avoiding NSAIDs and staying well hydrated. 

## 2012-06-23 ENCOUNTER — Ambulatory Visit (INDEPENDENT_AMBULATORY_CARE_PROVIDER_SITE_OTHER): Payer: Medicare Other | Admitting: Cardiovascular Disease

## 2012-06-23 ENCOUNTER — Encounter: Payer: Self-pay | Admitting: Cardiovascular Disease

## 2012-06-23 VITALS — BP 141/73 | HR 74 | Ht 74.0 in | Wt 263.1 lb

## 2012-06-23 DIAGNOSIS — I2510 Atherosclerotic heart disease of native coronary artery without angina pectoris: Secondary | ICD-10-CM

## 2012-06-23 DIAGNOSIS — E7800 Pure hypercholesterolemia, unspecified: Secondary | ICD-10-CM

## 2012-06-23 NOTE — Progress Notes (Signed)
HPI:  71 year-old gentleman presenting for followup evaluation. The patient has coronary artery disease. He was treated with a drug-eluting stent in the LAD in 2004. His last stress test in 2010 showed no ischemic changes. Labs were recently done on 05/26/2012. His creatinine was 1.6. Total cholesterol 106, triglycerides 65, HDL 33, and LDL 60. Creatinine is stable from previous value of 1.7 and 2012.  The patient feels well. He has not been involved in any regular exercise. 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 He denies chest pain, chest pressure, and orthopnea, or PND. He has noticed some mild leg swelling. He gets short of breath when he bends forward, but he attributes this to his weight.  Outpatient Encounter Prescriptions as of 06/23/2012  Medication Sig Dispense Refill  . aspirin EC 81 MG tablet Take 81 mg by mouth daily.        . Cholecalciferol (VITAMIN D-3) 5000 UNITS TABS Take by mouth daily.        . CRESTOR 5 MG tablet TAKE 1 TABLET DAILY  90 tablet  3  . cyanocobalamin (,VITAMIN B-12,) 1000 MCG/ML injection Inject 1,000 mcg into the muscle every 30 (thirty) days.      . ramipril (ALTACE) 5 MG capsule TAKE 1 CAPSULE TWICE A DAY  180 capsule  3    Allergies  Allergen Reactions  . Codeine     REACTION: abd. pain  . Morphine     REACTION: hives  . Simvastatin     Past Medical History  Diagnosis Date  . CAD (coronary artery disease) 2004    s/p MI treated with drug-eluting stent placement to the proximal left anterior descending in late 2004  . Renal insufficiency   . Hypercholesteremia   . Urticaria     Lat left arm  . Anemia, iron deficiency   . Helicobacter pylori gastritis     recurrent  . Personal history of colonic polyps   . Acute duodenal ulcer with hemorrhage but without obstruction   . GERD (gastroesophageal reflux disease)   . Hyperglycemia   . Myocardial infarct   . Erb's palsy     congenital  . Diverticulosis of colon     ROS:  Negative except as per HPI  BP 141/73  Pulse 74  Ht 6\' 2"  (1.88 m)  Wt 119.35 kg (263 lb 1.9 oz)  BMI 33.78 kg/m2  PHYSICAL EXAM: Pt is alert and oriented, very pleasant overweight male in NAD HEENT: normal Neck: JVP - normal, carotids 2+= without bruits Lungs: CTA bilaterally CV: RRR without murmur or gallop Abd: soft, NT, Positive BS, no hepatomegaly Ext: Trace pretibial edema bilaterally, distal pulses intact and equal, chronic right arm deformity unchanged Skin: warm/dry no rash  EKG:  Normal sinus rhythm 74 beats per minute, within normal limits.  ASSESSMENT AND PLAN: 1. Coronary atherosclerosis, native coronary artery. The patient remained stable without anginal symptoms. He is on appropriate medical program with use of aspirin, a statin drug, and an ACE inhibitor. His EKG remains normal. I encouraged him to increase his activity level and try to initiate a regular exercise program. I will see him back in 12 months. He was counseled regarding the need for weight loss.  2. Hypercholesterolemia. Lipids reviewed and his total cholesterol is 106, HDL 33, triglycerides 65, and LDL 60.  For followup I would like to see him back in 12 months. He will call if problems arise in the interim.  Tonny Bollman 06/23/2012 5:35  PM

## 2012-06-23 NOTE — Patient Instructions (Addendum)
Your physician wants you to follow-up in: 1 YEAR.  You will receive a reminder letter in the mail two months in advance. If you don't receive a letter, please call our office to schedule the follow-up appointment.  Your physician recommends that you continue on your current medications as directed. Please refer to the Current Medication list given to you today.  

## 2012-07-06 ENCOUNTER — Ambulatory Visit (INDEPENDENT_AMBULATORY_CARE_PROVIDER_SITE_OTHER): Payer: Medicare Other | Admitting: *Deleted

## 2012-07-06 DIAGNOSIS — E538 Deficiency of other specified B group vitamins: Secondary | ICD-10-CM

## 2012-07-06 MED ORDER — CYANOCOBALAMIN 1000 MCG/ML IJ SOLN
1000.0000 ug | Freq: Once | INTRAMUSCULAR | Status: AC
  Administered 2012-07-06: 1000 ug via INTRAMUSCULAR

## 2012-08-05 ENCOUNTER — Ambulatory Visit: Payer: Medicare Other

## 2012-11-25 ENCOUNTER — Encounter: Payer: Self-pay | Admitting: Internal Medicine

## 2012-11-28 ENCOUNTER — Encounter: Payer: Self-pay | Admitting: Internal Medicine

## 2013-02-06 ENCOUNTER — Telehealth: Payer: Self-pay

## 2013-02-06 NOTE — Telephone Encounter (Signed)
Pt has been seeing chiropractor for pinched nerve; started 4 days ago and chiropractor recommended a muscle relaxant, flexeril. Pt does not want to come in for appt. [facility] [address]Please advise.

## 2013-02-06 NOTE — Telephone Encounter (Signed)
I would offer him OV.  I wouldn't start a patient >65 on a potentially sedating med w/o eval first.

## 2013-02-07 NOTE — Telephone Encounter (Signed)
Left message with wife at home number.

## 2013-02-17 ENCOUNTER — Other Ambulatory Visit: Payer: Self-pay | Admitting: Family Medicine

## 2013-02-17 DIAGNOSIS — M542 Cervicalgia: Secondary | ICD-10-CM

## 2013-02-21 ENCOUNTER — Ambulatory Visit
Admission: RE | Admit: 2013-02-21 | Discharge: 2013-02-21 | Disposition: A | Discharge status: Home / Self Care | Payer: Medicare Other | Source: Ambulatory Visit | Attending: Family Medicine | Admitting: Family Medicine

## 2013-02-21 DIAGNOSIS — M542 Cervicalgia: Secondary | ICD-10-CM

## 2013-03-12 HISTORY — PX: ANTERIOR CERVICAL DECOMP/DISCECTOMY FUSION: SHX1161

## 2013-03-13 ENCOUNTER — Encounter (HOSPITAL_COMMUNITY): Payer: Self-pay | Admitting: Pharmacy Technician

## 2013-03-14 ENCOUNTER — Ambulatory Visit (HOSPITAL_COMMUNITY)
Admission: RE | Admit: 2013-03-14 | Discharge: 2013-03-14 | Disposition: A | Discharge status: Home / Self Care | Payer: Medicare Other | Source: Ambulatory Visit | Attending: Neurosurgery | Admitting: Neurosurgery

## 2013-03-14 ENCOUNTER — Encounter (HOSPITAL_COMMUNITY): Payer: Self-pay

## 2013-03-14 ENCOUNTER — Encounter (HOSPITAL_COMMUNITY)
Admission: RE | Admit: 2013-03-14 | Discharge: 2013-03-14 | Disposition: A | Discharge status: Home / Self Care | Payer: Medicare Other | Source: Ambulatory Visit | Attending: Neurosurgery | Admitting: Neurosurgery

## 2013-03-14 ENCOUNTER — Other Ambulatory Visit: Payer: Self-pay | Admitting: Neurosurgery

## 2013-03-14 HISTORY — DX: Unspecified osteoarthritis, unspecified site: M19.90

## 2013-03-14 HISTORY — DX: Monoplegia of upper limb affecting unspecified side: G83.20

## 2013-03-14 HISTORY — DX: Essential (primary) hypertension: I10

## 2013-03-14 HISTORY — DX: Personal history of other diseases of the digestive system: Z87.19

## 2013-03-14 LAB — CBC
HCT: 40.3 % (ref 39.0–52.0)
Hemoglobin: 13.5 g/dL (ref 13.0–17.0)
MCH: 30.3 pg (ref 26.0–34.0)
MCHC: 33.5 g/dL (ref 30.0–36.0)
MCV: 90.4 fL (ref 78.0–100.0)
Platelets: 227 10*3/uL (ref 150–400)
RBC: 4.46 MIL/uL (ref 4.22–5.81)
RDW: 14.5 % (ref 11.5–15.5)
WBC: 7.1 10*3/uL (ref 4.0–10.5)

## 2013-03-14 LAB — BASIC METABOLIC PANEL
BUN: 23 mg/dL (ref 6–23)
CO2: 30 mEq/L (ref 19–32)
Calcium: 9.2 mg/dL (ref 8.4–10.5)
Chloride: 100 mEq/L (ref 96–112)
Creatinine, Ser: 1.57 mg/dL — ABNORMAL HIGH (ref 0.50–1.35)
GFR calc Af Amer: 49 mL/min — ABNORMAL LOW (ref >90)
GFR calc non Af Amer: 42 mL/min — ABNORMAL LOW (ref >90)
Glucose, Bld: 117 mg/dL — ABNORMAL HIGH (ref 70–99)
Potassium: 4.2 mEq/L (ref 3.5–5.1)
Sodium: 135 mEq/L (ref 135–145)

## 2013-03-14 LAB — SURGICAL PCR SCREEN
MRSA, PCR: NEGATIVE
Staphylococcus aureus: POSITIVE — AB

## 2013-03-14 NOTE — Progress Notes (Signed)
Call to A. Zelenak,PAC, regarding cardiac history, she will review the chart.

## 2013-03-14 NOTE — Progress Notes (Signed)
Call to [facility]. For orders.

## 2013-03-14 NOTE — Pre-Procedure Instructions (Signed)
Luis Sweeney  03/14/2013   Your procedure is scheduled on:  Wednesday, June 4th  Report to [facility] Short Stay Center at 230 PM. Come to main entrance "A" and go to east elevators up to 3rd floor. Check in at short stay desk.  Call this number if you have problems the morning of surgery: [phone]   Remember:   Do not eat food or drink liquids after midnight.   Take these medicines the morning of surgery with A SIP OF WATER: Vicodin if needed   Do not wear jewelry, make-up or nail polish.  Do not wear lotions, powders, or perfumes, deodorant.  Do not shave 48 hours prior to surgery. Men may shave face and neck.  Do not bring valuables to the hospital.  [facility] is not responsible for any belongings or valuables.  Contacts, dentures or bridgework may not be worn into surgery.  Leave suitcase in the car. After surgery it may be brought to your room.  For patients admitted to the hospital, checkout time is 11:00 AM the day of discharge.   Patients discharged the day of surgery will not be allowed to drive home.    Special Instructions: Shower using CHG 2 nights before surgery and the night before surgery.  If you shower the day of surgery use CHG.  Use special wash - you have one bottle of CHG for all showers.  You should use approximately 1/3 of the bottle for each shower.   Please read over the following fact sheets that you were given: Pain Booklet, Coughing and Deep Breathing, MRSA Information and Surgical Site Infection Prevention

## 2013-03-14 NOTE — Progress Notes (Signed)
Anesthesia PAT evaluation: Patient is a 72 year old male posted for a 2 level ACDF by Dr. Gerlene Sweeney on 03/15/2013. His PAT visit was on 03/14/2013.  History includes CAD s/p LAD stent '04, chronic renal insufficiency, Erb's Palsy involving the RUE, iron deficiency anemia, hypercholesterolemia, GERD, duodenal ulcer, diverticulosis, hiatal hernia, former smoker, obesity, arthritis.  Questionable for hyperglycemia although patient denies. His previous PCP Dr. Laurita Sweeney retired, so he is intermittently being seen by other Luis Sweeney physicians until he decides on a permanent replacement.  He was last seen by Dr. Sharen Sweeney.    Cardiologist is Dr. Excell Sweeney, last visit on 06/23/12.  EKG then showed NSR. Patient was doing well and continued medical therapy was recommended with one year follow-up.  Patient continues to deny chest pain, SOB, or significant DOE.  He was chronic, mild LE edema which he feels is unchanged.  He continues to work, but it not overly active.  He does not typically do many stairs due to left knee pain. Exam shows a pleasant, Caucasian male in NAD.  Heart RRR, no significant murmur noted.  Lungs were clear.  Mild (up to 1+) LE edema noted.  No carotid bruits were auscultated.   He had a normal nuclear stress test, EF 58% on 03/27/09.  (Of note, he felt the whole experience was awful and feels strongly about not having that particular type of stress test again.)  Cardiac cath on 09/03/03 (in the setting of NSTEMI) showed 90% proximal LAD stenosis, 30% ostial OM1 stenosis, normal RCA, EF 45%, apical dyskinesis, anterior lateral hypokinesis, and hypokinesis of the distal portion of the inferior wall.  He underwent successful stenting of the LAD lesion with a Taxus stent.  Chest x-ray on 03/14/2013 showed no active lung disease. Mild peribronchial thickening.  Preoperative labs noted. Creatinine stable at 1.57. Nonfasting glucose is 117. CBC within normal limits.  Patient has known CAD, but  continues to be asymptomatic and has had cardiology follow-up within the past year.  If no acute changes then I would anticipate that he could proceed as planned.  Anesthesiologist Dr. Jacklynn Sweeney agrees with this plan.  Luis Sweeney [facility] Short Stay Center/Anesthesiology Phone [phone] 03/14/2013 1:25 PM

## 2013-03-15 ENCOUNTER — Encounter (HOSPITAL_COMMUNITY): Payer: Self-pay | Admitting: Vascular Surgery

## 2013-03-15 ENCOUNTER — Inpatient Hospital Stay (HOSPITAL_COMMUNITY)
Admission: RE | Admit: 2013-03-15 | Discharge: 2013-03-16 | DRG: 473 | Disposition: A | Discharge status: Home / Self Care | Payer: Medicare Other | Source: Ambulatory Visit | Attending: Neurosurgery | Admitting: Neurosurgery

## 2013-03-15 ENCOUNTER — Inpatient Hospital Stay (HOSPITAL_COMMUNITY): Payer: Medicare Other

## 2013-03-15 ENCOUNTER — Inpatient Hospital Stay (HOSPITAL_COMMUNITY): Payer: Medicare Other | Admitting: Anesthesiology

## 2013-03-15 ENCOUNTER — Encounter (HOSPITAL_COMMUNITY): Payer: Self-pay | Admitting: *Deleted

## 2013-03-15 ENCOUNTER — Encounter (HOSPITAL_COMMUNITY)
Admission: RE | Disposition: A | Discharge status: Home / Self Care | Payer: Self-pay | Source: Ambulatory Visit | Attending: Neurosurgery

## 2013-03-15 DIAGNOSIS — M1990 Unspecified osteoarthritis, unspecified site: Secondary | ICD-10-CM | POA: Diagnosis present

## 2013-03-15 DIAGNOSIS — I2510 Atherosclerotic heart disease of native coronary artery without angina pectoris: Secondary | ICD-10-CM | POA: Diagnosis present

## 2013-03-15 DIAGNOSIS — Z79899 Other long term (current) drug therapy: Secondary | ICD-10-CM

## 2013-03-15 DIAGNOSIS — Z860100 Personal history of colon polyps, unspecified: Secondary | ICD-10-CM

## 2013-03-15 DIAGNOSIS — Z9861 Coronary angioplasty status: Secondary | ICD-10-CM

## 2013-03-15 DIAGNOSIS — K5730 Diverticulosis of large intestine without perforation or abscess without bleeding: Secondary | ICD-10-CM | POA: Diagnosis present

## 2013-03-15 DIAGNOSIS — D509 Iron deficiency anemia, unspecified: Secondary | ICD-10-CM | POA: Diagnosis present

## 2013-03-15 DIAGNOSIS — Z888 Allergy status to other drugs, medicaments and biological substances status: Secondary | ICD-10-CM

## 2013-03-15 DIAGNOSIS — I252 Old myocardial infarction: Secondary | ICD-10-CM

## 2013-03-15 DIAGNOSIS — Z8249 Family history of ischemic heart disease and other diseases of the circulatory system: Secondary | ICD-10-CM

## 2013-03-15 DIAGNOSIS — Z833 Family history of diabetes mellitus: Secondary | ICD-10-CM

## 2013-03-15 DIAGNOSIS — E7800 Pure hypercholesterolemia, unspecified: Secondary | ICD-10-CM | POA: Diagnosis present

## 2013-03-15 DIAGNOSIS — M5020 Other cervical disc displacement, unspecified cervical region: Principal | ICD-10-CM | POA: Diagnosis present

## 2013-03-15 DIAGNOSIS — I10 Essential (primary) hypertension: Secondary | ICD-10-CM | POA: Diagnosis present

## 2013-03-15 DIAGNOSIS — Z01812 Encounter for preprocedural laboratory examination: Secondary | ICD-10-CM

## 2013-03-15 DIAGNOSIS — Z841 Family history of disorders of kidney and ureter: Secondary | ICD-10-CM

## 2013-03-15 DIAGNOSIS — Z87891 Personal history of nicotine dependence: Secondary | ICD-10-CM

## 2013-03-15 DIAGNOSIS — M47812 Spondylosis without myelopathy or radiculopathy, cervical region: Secondary | ICD-10-CM | POA: Diagnosis present

## 2013-03-15 DIAGNOSIS — Z8261 Family history of arthritis: Secondary | ICD-10-CM

## 2013-03-15 DIAGNOSIS — K219 Gastro-esophageal reflux disease without esophagitis: Secondary | ICD-10-CM | POA: Diagnosis present

## 2013-03-15 HISTORY — PX: ANTERIOR CERVICAL DECOMP/DISCECTOMY FUSION: SHX1161

## 2013-03-15 SURGERY — ANTERIOR CERVICAL DECOMPRESSION/DISCECTOMY FUSION 2 LEVELS
Anesthesia: General | Site: Neck | Wound class: Clean

## 2013-03-15 MED ORDER — ONDANSETRON HCL 4 MG/2ML IJ SOLN
INTRAMUSCULAR | Status: DC | PRN
  Administered 2013-03-15: 4 mg via INTRAVENOUS

## 2013-03-15 MED ORDER — SODIUM CHLORIDE 0.9 % IV SOLN: 250.0000 mL | INTRAVENOUS | Status: DC

## 2013-03-15 MED ORDER — CEFAZOLIN SODIUM-DEXTROSE 2-3 GM-% IV SOLR: 2.0000 g | Freq: Three times a day (TID) | INTRAVENOUS | Status: DC

## 2013-03-15 MED ORDER — SUFENTANIL CITRATE 50 MCG/ML IV SOLN
INTRAVENOUS | Status: DC | PRN
  Administered 2013-03-15: 10 ug via INTRAVENOUS
  Administered 2013-03-15: 20 ug via INTRAVENOUS

## 2013-03-15 MED ORDER — SODIUM CHLORIDE 0.9 % IJ SOLN
3.0000 mL | Freq: Two times a day (BID) | INTRAMUSCULAR | Status: DC
  Administered 2013-03-16: 3 mL via INTRAVENOUS

## 2013-03-15 MED ORDER — RAMIPRIL 5 MG PO CAPS
5.0000 mg | ORAL_CAPSULE | Freq: Two times a day (BID) | ORAL | Status: DC
  Administered 2013-03-15 – 2013-03-16 (×2): 5 mg via ORAL
  Filled 2013-03-15 (×3): qty 1

## 2013-03-15 MED ORDER — MENTHOL 3 MG MT LOZG: 1.0000 | LOZENGE | OROMUCOSAL | Status: DC | PRN

## 2013-03-15 MED ORDER — ACETAMINOPHEN 10 MG/ML IV SOLN: 1000.0000 mg | Freq: Once | INTRAVENOUS | Status: DC | PRN

## 2013-03-15 MED ORDER — ONDANSETRON HCL 4 MG/2ML IJ SOLN: 4.0000 mg | INTRAMUSCULAR | Status: DC | PRN

## 2013-03-15 MED ORDER — PHENOL 1.4 % MT LIQD: 1.0000 | OROMUCOSAL | Status: DC | PRN

## 2013-03-15 MED ORDER — PROPOFOL 10 MG/ML IV BOLUS
INTRAVENOUS | Status: DC | PRN
  Administered 2013-03-15: 160 mg via INTRAVENOUS

## 2013-03-15 MED ORDER — ACETAMINOPHEN 650 MG RE SUPP: 650.0000 mg | RECTAL | Status: DC | PRN

## 2013-03-15 MED ORDER — ONDANSETRON HCL 4 MG/2ML IJ SOLN: 4.0000 mg | Freq: Once | INTRAMUSCULAR | Status: DC | PRN

## 2013-03-15 MED ORDER — ACETAMINOPHEN 10 MG/ML IV SOLN
INTRAVENOUS | Status: AC
  Administered 2013-03-15: 1000 mg via INTRAVENOUS
  Filled 2013-03-15: qty 100

## 2013-03-15 MED ORDER — LACTATED RINGERS IV SOLN
INTRAVENOUS | Status: DC | PRN
  Administered 2013-03-15 (×3): via INTRAVENOUS

## 2013-03-15 MED ORDER — DEXAMETHASONE SODIUM PHOSPHATE 4 MG/ML IJ SOLN
4.0000 mg | Freq: Four times a day (QID) | INTRAMUSCULAR | Status: AC
  Administered 2013-03-15 – 2013-03-16 (×2): 4 mg via INTRAVENOUS
  Filled 2013-03-15 (×2): qty 1

## 2013-03-15 MED ORDER — CEFAZOLIN SODIUM-DEXTROSE 2-3 GM-% IV SOLR
2.0000 g | INTRAVENOUS | Status: AC
  Administered 2013-03-15: 2 g via INTRAVENOUS

## 2013-03-15 MED ORDER — CYCLOBENZAPRINE HCL 10 MG PO TABS
10.0000 mg | ORAL_TABLET | Freq: Three times a day (TID) | ORAL | Status: DC | PRN
  Administered 2013-03-15: 10 mg via ORAL
  Filled 2013-03-15: qty 1

## 2013-03-15 MED ORDER — GLYCOPYRROLATE 0.2 MG/ML IJ SOLN
INTRAMUSCULAR | Status: DC | PRN
  Administered 2013-03-15: .6 mg via INTRAVENOUS

## 2013-03-15 MED ORDER — KCL IN DEXTROSE-NACL 20-5-0.45 MEQ/L-%-% IV SOLN
80.0000 mL/h | INTRAVENOUS | Status: DC
  Filled 2013-03-15 (×2): qty 1000

## 2013-03-15 MED ORDER — DEXAMETHASONE 4 MG PO TABS: 4.0000 mg | ORAL_TABLET | Freq: Four times a day (QID) | ORAL | Status: AC

## 2013-03-15 MED ORDER — PHENYLEPHRINE HCL 10 MG/ML IJ SOLN
10.0000 mg | INTRAVENOUS | Status: DC | PRN
  Administered 2013-03-15: 20 ug/min via INTRAVENOUS

## 2013-03-15 MED ORDER — ZOLPIDEM TARTRATE 5 MG PO TABS: 5.0000 mg | ORAL_TABLET | Freq: Every evening | ORAL | Status: DC | PRN

## 2013-03-15 MED ORDER — LIDOCAINE HCL (CARDIAC) 20 MG/ML IV SOLN
INTRAVENOUS | Status: DC | PRN
  Administered 2013-03-15: 60 mg via INTRAVENOUS

## 2013-03-15 MED ORDER — HYDROMORPHONE HCL PF 1 MG/ML IJ SOLN
0.2500 mg | INTRAMUSCULAR | Status: DC | PRN
  Administered 2013-03-15: 0.5 mg via INTRAVENOUS

## 2013-03-15 MED ORDER — MUPIROCIN 2 % EX OINT
TOPICAL_OINTMENT | Freq: Two times a day (BID) | CUTANEOUS | Status: DC
  Administered 2013-03-15 – 2013-03-16 (×2): via NASAL

## 2013-03-15 MED ORDER — HYDROCODONE-ACETAMINOPHEN 5-325 MG PO TABS
1.0000 | ORAL_TABLET | ORAL | Status: DC | PRN
  Administered 2013-03-16 (×2): 2 via ORAL
  Filled 2013-03-15 (×2): qty 2

## 2013-03-15 MED ORDER — ROCURONIUM BROMIDE 100 MG/10ML IV SOLN
INTRAVENOUS | Status: DC | PRN
  Administered 2013-03-15 (×2): 10 mg via INTRAVENOUS
  Administered 2013-03-15: 50 mg via INTRAVENOUS

## 2013-03-15 MED ORDER — DEXAMETHASONE SODIUM PHOSPHATE 10 MG/ML IJ SOLN
10.0000 mg | INTRAMUSCULAR | Status: AC
  Administered 2013-03-15: 10 mg via INTRAVENOUS

## 2013-03-15 MED ORDER — 0.9 % SODIUM CHLORIDE (POUR BTL) OPTIME
TOPICAL | Status: DC | PRN
  Administered 2013-03-15: 1000 mL

## 2013-03-15 MED ORDER — SODIUM CHLORIDE 0.9 % IV SOLN
INTRAVENOUS | Status: AC
  Filled 2013-03-15: qty 500

## 2013-03-15 MED ORDER — HYDROMORPHONE HCL PF 1 MG/ML IJ SOLN
INTRAMUSCULAR | Status: AC
  Filled 2013-03-15: qty 1

## 2013-03-15 MED ORDER — FENTANYL CITRATE 0.05 MG/ML IJ SOLN
INTRAMUSCULAR | Status: DC | PRN
  Administered 2013-03-15: 100 ug via INTRAVENOUS

## 2013-03-15 MED ORDER — SODIUM CHLORIDE 0.9 % IR SOLN
Status: DC | PRN
  Administered 2013-03-15: 19:00:00

## 2013-03-15 MED ORDER — ATORVASTATIN CALCIUM 10 MG PO TABS
10.0000 mg | ORAL_TABLET | Freq: Every day | ORAL | Status: DC
  Filled 2013-03-15: qty 1

## 2013-03-15 MED ORDER — SODIUM CHLORIDE 0.9 % IJ SOLN: 3.0000 mL | INTRAMUSCULAR | Status: DC | PRN

## 2013-03-15 MED ORDER — HEMOSTATIC AGENTS (NO CHARGE) OPTIME
TOPICAL | Status: DC | PRN
  Administered 2013-03-15: 1 via TOPICAL

## 2013-03-15 MED ORDER — ACETAMINOPHEN 325 MG PO TABS: 650.0000 mg | ORAL_TABLET | ORAL | Status: DC | PRN

## 2013-03-15 MED ORDER — THROMBIN 5000 UNITS EX SOLR
CUTANEOUS | Status: DC | PRN
  Administered 2013-03-15 (×2): 5000 [IU] via TOPICAL

## 2013-03-15 MED ORDER — BACITRACIN 50000 UNITS IM SOLR
INTRAMUSCULAR | Status: AC
  Filled 2013-03-15: qty 1

## 2013-03-15 MED ORDER — HYDROMORPHONE HCL PF 1 MG/ML IJ SOLN: 1.0000 mg | INTRAMUSCULAR | Status: DC | PRN

## 2013-03-15 MED ORDER — NEOSTIGMINE METHYLSULFATE 1 MG/ML IJ SOLN
INTRAMUSCULAR | Status: DC | PRN
  Administered 2013-03-15: 5 mg via INTRAVENOUS

## 2013-03-15 MED ORDER — MIDAZOLAM HCL 5 MG/5ML IJ SOLN
INTRAMUSCULAR | Status: DC | PRN
  Administered 2013-03-15: 2 mg via INTRAVENOUS

## 2013-03-15 SURGICAL SUPPLY — 59 items
BAG DECANTER FOR FLEXI CONT (MISCELLANEOUS) ×2 IMPLANT
BENZOIN TINCTURE PRP APPL 2/3 (GAUZE/BANDAGES/DRESSINGS) ×2 IMPLANT
BIT DRILL TRINICA 2.3MM (BIT) ×1 IMPLANT
BNDG COHESIVE 3X5 WHT NS (GAUZE/BANDAGES/DRESSINGS) ×2 IMPLANT
BONE EQUIVA 5CC (Bone Implant) ×2 IMPLANT
BRUSH SCRUB EZ PLAIN DRY (MISCELLANEOUS) ×2 IMPLANT
BUR MATCHSTICK NEURO 3.0 LAGG (BURR) ×2 IMPLANT
CANISTER SUCTION 2500CC (MISCELLANEOUS) ×2 IMPLANT
CLOTH BEACON ORANGE TIMEOUT ST (SAFETY) ×2 IMPLANT
CONT SPEC 4OZ CLIKSEAL STRL BL (MISCELLANEOUS) ×2 IMPLANT
DRAPE C-ARM 42X72 X-RAY (DRAPES) ×4 IMPLANT
DRAPE LAPAROTOMY 100X72 PEDS (DRAPES) ×2 IMPLANT
DRAPE MICROSCOPE ZEISS OPMI (DRAPES) ×2 IMPLANT
DRAPE SURG 17X23 STRL (DRAPES) ×4 IMPLANT
DRESSING TELFA 8X3 (GAUZE/BANDAGES/DRESSINGS) ×2 IMPLANT
DRILL BIT TRINICA 2.3MM (BIT) ×2
DURAPREP 6ML APPLICATOR 50/CS (WOUND CARE) ×2 IMPLANT
ELECT COATED BLADE 2.86 ST (ELECTRODE) ×2 IMPLANT
ELECT REM PT RETURN 9FT ADLT (ELECTROSURGICAL) ×2
ELECTRODE REM PT RTRN 9FT ADLT (ELECTROSURGICAL) ×1 IMPLANT
GAUZE SPONGE 4X4 16PLY XRAY LF (GAUZE/BANDAGES/DRESSINGS) IMPLANT
GLOVE BIO SURGEON STRL SZ7 (GLOVE) ×2 IMPLANT
GLOVE BIO SURGEON STRL SZ8.5 (GLOVE) ×2 IMPLANT
GLOVE BIOGEL PI IND STRL 7.5 (GLOVE) ×3 IMPLANT
GLOVE BIOGEL PI INDICATOR 7.5 (GLOVE) ×3
GLOVE ECLIPSE 7.5 STRL STRAW (GLOVE) ×6 IMPLANT
GLOVE EXAM NITRILE LRG STRL (GLOVE) IMPLANT
GLOVE EXAM NITRILE MD LF STRL (GLOVE) ×2 IMPLANT
GLOVE EXAM NITRILE XL STR (GLOVE) IMPLANT
GLOVE EXAM NITRILE XS STR PU (GLOVE) IMPLANT
GLOVE SS BIOGEL STRL SZ 8 (GLOVE) ×1 IMPLANT
GLOVE SUPERSENSE BIOGEL SZ 8 (GLOVE) ×1
GOWN BRE IMP SLV AUR LG STRL (GOWN DISPOSABLE) ×2 IMPLANT
GOWN BRE IMP SLV AUR XL STRL (GOWN DISPOSABLE) ×6 IMPLANT
GOWN STRL REIN 2XL LVL4 (GOWN DISPOSABLE) IMPLANT
HEAD HALTER (SOFTGOODS) ×2 IMPLANT
INTERBODY TM 11X14X7-7DEG ANG (Metal Cage) ×2 IMPLANT
KIT BASIN OR (CUSTOM PROCEDURE TRAY) ×2 IMPLANT
KIT ROOM TURNOVER OR (KITS) ×2 IMPLANT
NEEDLE SPNL 20GX3.5 QUINCKE YW (NEEDLE) ×2 IMPLANT
NS IRRIG 1000ML POUR BTL (IV SOLUTION) ×2 IMPLANT
PACK LAMINECTOMY NEURO (CUSTOM PROCEDURE TRAY) ×2 IMPLANT
PAD ARMBOARD 7.5X6 YLW CONV (MISCELLANEOUS) ×4 IMPLANT
PATTIES SURGICAL .75X.75 (GAUZE/BANDAGES/DRESSINGS) IMPLANT
PLATE 44MM (Plate) ×2 IMPLANT
RUBBERBAND STERILE (MISCELLANEOUS) ×4 IMPLANT
SCREW SELF DRILL FIXED 14MM (Screw) ×12 IMPLANT
SPACER TMS 11X14X6MM (Spacer) ×2 IMPLANT
SPONGE GAUZE 4X4 12PLY (GAUZE/BANDAGES/DRESSINGS) ×2 IMPLANT
SPONGE INTESTINAL PEANUT (DISPOSABLE) ×2 IMPLANT
SPONGE SURGIFOAM ABS GEL SZ50 (HEMOSTASIS) ×2 IMPLANT
STRIP CLOSURE SKIN 1/2X4 (GAUZE/BANDAGES/DRESSINGS) ×2 IMPLANT
SUT PDS AB 5-0 P3 18 (SUTURE) ×2 IMPLANT
SUT VIC AB 3-0 CP2 18 (SUTURE) ×2 IMPLANT
SYR 20ML ECCENTRIC (SYRINGE) ×2 IMPLANT
TOWEL OR 17X24 6PK STRL BLUE (TOWEL DISPOSABLE) ×2 IMPLANT
TOWEL OR 17X26 10 PK STRL BLUE (TOWEL DISPOSABLE) ×2 IMPLANT
TRAP SPECIMEN MUCOUS 40CC (MISCELLANEOUS) ×2 IMPLANT
WATER STERILE IRR 1000ML POUR (IV SOLUTION) ×2 IMPLANT

## 2013-03-15 NOTE — Progress Notes (Signed)
Report given to Savanna RN

## 2013-03-15 NOTE — Anesthesia Procedure Notes (Signed)
Procedure Name: Intubation Date/Time: 03/15/2013 6:00 PM Performed by: Coralee Rud Pre-anesthesia Checklist: Patient identified, Emergency Drugs available, Suction available and Patient being monitored Patient Re-evaluated:Patient Re-evaluated prior to inductionOxygen Delivery Method: Circle system utilized Preoxygenation: Pre-oxygenation with 100% oxygen Intubation Type: IV induction Ventilation: Mask ventilation without difficulty Laryngoscope Size: Miller and 3 (Improved by external pressure by Dr. Noreene Larsson) Grade View: Grade III Tube type: Oral Tube size: 8.0 mm Airway Equipment and Method: Stylet and LTA kit utilized Placement Confirmation: ETT inserted through vocal cords under direct vision,  positive ETCO2 and breath sounds checked- equal and bilateral Secured at: 22 cm Tube secured with: Tape Dental Injury: Teeth and Oropharynx as per pre-operative assessment

## 2013-03-15 NOTE — Transfer of Care (Signed)
Immediate Anesthesia Transfer of Care Note  Patient: Luis Sweeney  Procedure(s) Performed: Procedure(s) with comments: ANTERIOR CERVICAL DECOMPRESSION/DISCECTOMY FUSION 2 LEVELS (N/A) - Cervical three-four, cervical four-five anterior cervical diskectomy fusion with a trabecular metal plus plate   Patient Location: PACU  Anesthesia Type:General  Level of Consciousness: awake, alert  and oriented  Airway & Oxygen Therapy: Patient Spontanous Breathing and Patient connected to nasal cannula oxygen  Post-op Assessment: Report given to PACU RN, Post -op Vital signs reviewed and stable and Patient moving all extremities X 4  Post vital signs: Reviewed and stable  Complications: No apparent anesthesia complications

## 2013-03-15 NOTE — Anesthesia Postprocedure Evaluation (Signed)
  Anesthesia Post-op Note  Patient: JEFFEY JANSSEN  Procedure(s) Performed: Procedure(s) with comments: ANTERIOR CERVICAL DECOMPRESSION/DISCECTOMY FUSION 2 LEVELS (N/A) - Cervical three-four, cervical four-five anterior cervical diskectomy fusion with a trabecular metal plus plate   Patient Location: PACU  Anesthesia Type:General  Level of Consciousness: awake, oriented, sedated and patient cooperative  Airway and Oxygen Therapy: Patient Spontanous Breathing  Post-op Pain: moderate  Post-op Assessment: Post-op Vital signs reviewed, Patient's Cardiovascular Status Stable, Respiratory Function Stable, Patent Airway, No signs of Nausea or vomiting and Pain level controlled  Post-op Vital Signs: stable  Complications: No apparent anesthesia complications

## 2013-03-15 NOTE — Preoperative (Signed)
Beta Blockers   Reason not to administer Beta Blockers:Hold  beta blocker due to Bradycardia (HR less than 50 bpm) 

## 2013-03-15 NOTE — Op Note (Signed)
Preoperative diagnosis: Herniated disc C3-4 C4-5  postop diagnosis: Same Procedure: C3-4 C4-5 decompressive anterior cervical discectomy with trabecular metal interbody fusion and Trinica anterior cervical plating  Surgeon: Aerilynn Goin Assistant: Lovell Sheehan  After and placed the supine position and 10 pounds halter traction the patient's neck was prepped and draped in the usual sterile fashion. Localizing fluoroscopy was used prior to incision to identify the appropriate level. Transverse incision was made in the right anterior neck started the midline and headed towards the medial aspect of the sternal cremaster muscle. The platysma muscle was incised transversely and the natural fascial plane between the strap muscles medially and the sternocleidomastoid laterally was identified and followed down to the anterior aspect the cervical spine. Longus Cole muscles were identified split the midline and stripped away bilaterally with unipolar coagulation and Kitner dissection. Subcutaneous tract was placed for exposure and x-ray showed approach the appropriate levels. Using a 15 blade the [place] disc at C3-4 C4-5 were incised. Using pituitary rongeurs and curettes approximately 90% of the disc material was removed. High-speed drill was used to widen the interspace and bony shavings were saved for use later in the case. At this time the microscope was draped brought in the field and used for the remainder of the case. Starting at C3 for the remainder of the disc material down the posterior longitudinal ligament was removed. Ligament was incised transversely and the cut edges removed a Kerrison punch. Thorough decompression was carried out the foramen at C3-4 on the left, symmetric side were large amounts of herniated disc which were identified removed. Attention was then turned to the midline dura and opposite side where additional decompression was carried out and as aggressively as we had an astigmatic side. Attention was  then turned to the C4-5 level were similar decompression was carried out. Once again herniated disc material compressing the C5 nerve root was identified removed and thorough decompression was carried out on the spinal dura into the proximal foramen bilaterally. At this time inspection was carried out in all directions for any evidence of residual compression and none could be identified. Irrigation was carried out and any bleeding control proper coagulation and Gelfoam. Measurements were taken and a 6 a 7 mm R.graft were chosen and filled mixture of autologous bone morselized allograft. After irrigating once more and confirming hemostasis the 6 mm graft was impacted at C3-4 and a 7 mm graft impacted at C4-5. Fossae showed this to be in excellent position. An appropriately length Trinica anterior cervical plate was then chosen. Under fluoroscopic guidance drill holes were placed followed by placing of 14 mm screws x6. Locking mechanism was rotated locked position and final fossae showed good position of the plates screws and plugs. Irrigation was carried out and any bleeding 12 proper coagulation. The was then closed within Vicryl on the platysma muscle and subcuticular layer. Steri-Strips were placed on the skin. A sterile dressing and soft collar applied and the patient was extubated and taken to cut them in stable condition.

## 2013-03-15 NOTE — Anesthesia Preprocedure Evaluation (Signed)
Anesthesia Evaluation  Patient identified by MRN, date of birth, ID band Patient awake    Reviewed: Allergy & Precautions, H&P , NPO status , Patient's Chart, lab work & pertinent test results  Airway Mallampati: II      Dental  (+) Teeth Intact and Dental Advisory Given   Pulmonary  breath sounds clear to auscultation        Cardiovascular Rhythm:Regular Rate:Normal     Neuro/Psych    GI/Hepatic   Endo/Other    Renal/GU      Musculoskeletal   Abdominal (+) + obese,   Peds  Hematology   Anesthesia Other Findings   Reproductive/Obstetrics                           Anesthesia Physical Anesthesia Plan  ASA: III  Anesthesia Plan: General   Post-op Pain Management:    Induction: Intravenous  Airway Management Planned: Oral ETT  Additional Equipment:   Intra-op Plan:   Post-operative Plan: Extubation in OR  Informed Consent: I have reviewed the patients History and Physical, chart, labs and discussed the procedure including the risks, benefits and alternatives for the proposed anesthesia with the patient or authorized representative who has indicated his/her understanding and acceptance.   Dental advisory given  Plan Discussed with: CRNA, Anesthesiologist and Surgeon  Anesthesia Plan Comments: (Cervical spondylosis CAD S/P PTCA with stent LAD 2004, (-) cardiolite stress test 03/27/09 htn Renal insufficiency Cr. 1.57 Obesity Erb's palsy L. Arm since birth H/O duodenal ulcer with hemmorrhage)        Anesthesia Quick Evaluation

## 2013-03-15 NOTE — H&P (Signed)
Luis Sweeney is an 72 y.o. male.   Chief Complaint: Neck and Left shoulder pain HPI: The patient is a 72 year old woman with a recent history of severe neck pain radiating towards the left shoulder but not down the arm. He's been tried on aggressive conservative therapy with medications which gave him no relief. He underwent imaging study which a disc abnormalities at C3-4 and C4-5 consistent with his upper extremity difficulty. After discussing the options the patient requested surgery now comes for a two-level anterior cervical discectomy with fusion and plating. I had a long discussion with him regarding the risks and benefits of surgical intervention. The risks discussed include but are not limited to bleeding infection weakness numbness paralysis: Instrumentation nonunion coma hoarseness and death. We have discussed alternative methods of therapy offered risks and benefits of nonintervention. He has had the opportunity to ask numerous questions and appears to understand. With this information in hand he has requested we proceed with surgery.  Past Medical History  Diagnosis Date  . CAD (coronary artery disease) 2004    s/p MI treated with drug-eluting stent placement to the proximal left anterior descending in late 2004  . Renal insufficiency   . Hypercholesteremia   . Urticaria     Lat left arm  . Anemia, iron deficiency   . Helicobacter pylori gastritis     recurrent  . Personal history of colonic polyps   . Acute duodenal ulcer with hemorrhage but without obstruction     required  bld. transfusion  . GERD (gastroesophageal reflux disease)   . Hyperglycemia   . Myocardial infarct   . Diverticulosis of colon   . Hypertension   . 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 H/O hiatal hernia   . 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 Arthritis     OA- pt. reports its. minor    Past Surgical History  Procedure Laterality Date  . Shoulder  surgery      right  . Wrist surgery    . Spinal cyst removal    . Knee surgery      left  . Coronary stent placement      drug-eluting  . Hernia repair  2006    right (Dr. Daphine Deutscher)  . Esophagogastroduodenoscopy  11/26/10    mild gastritis, duodenitis, duodenal stricture (Dr. Leone Payor)  . Colonoscopy  11/2007    diverticulosis, int hem, h/o adenomatous polyps Leone Payor)  . Cardiac catheterization  2006    stent placed    Family History  Problem Relation Age of Onset  . Diabetes Father   . Heart disease Father     CHF  . Kidney disease Father     kidney failure  . Heart failure Father   . Diabetes Sister   . Hypertension Sister   . Cancer Sister     pelvic mass  . Hypertension Sister   . Arthritis Sister   . Cancer Other     ovarian   Social History:  reports that he quit smoking about 28 years ago. He has never used smokeless tobacco. He reports that  drinks alcohol. He reports that he does not use illicit drugs.  Allergies:  Allergies  Allergen Reactions  . Codeine Hives, Itching and Other (See Comments)    Stomach cramps  . Morphine Hives and Itching  . Simvastatin Other (See Comments)    Muscle  pain    Medications Prior to Admission  Medication Sig Dispense Refill  . aspirin EC 81 MG tablet Take 81 mg by mouth daily.      . Cholecalciferol (VITAMIN D-3) 5000 UNITS TABS Take by mouth.      . Cyanocobalamin (VITAMIN B-12 PO) Take 5,000 mcg by mouth daily.      Marland Kitchen HYDROcodone-acetaminophen (NORCO) 10-325 MG per tablet Take 1 tablet by mouth 2 (two) times daily as needed for pain.      . ramipril (ALTACE) 5 MG capsule Take 5 mg by mouth 2 (two) times daily.      . rosuvastatin (CRESTOR) 5 MG tablet Take 5 mg by mouth every evening.        Results for orders placed during the hospital encounter of 03/14/13 (from the past 48 hour(s))  SURGICAL PCR SCREEN     Status: Abnormal   Collection Time    03/14/13 11:14 AM      Result Value Range   MRSA, PCR NEGATIVE   NEGATIVE   Staphylococcus aureus POSITIVE (*) NEGATIVE   Comment:            The Xpert SA Assay (FDA     approved for NASAL specimens     in patients over 54 years of age),     is one component of     a comprehensive surveillance     program.  Test performance has     been validated by [employer] for patients greater     than or equal to 34 year old.     It is not intended     to diagnose infection nor to     guide or monitor treatment.  BASIC METABOLIC PANEL     Status: Abnormal   Collection Time    03/14/13 11:14 AM      Result Value Range   Sodium 135  135 - 145 mEq/L   Potassium 4.2  3.5 - 5.1 mEq/L   Chloride 100  96 - 112 mEq/L   CO2 30  19 - 32 mEq/L   Glucose, Bld 117 (*) 70 - 99 mg/dL   BUN 23  6 - 23 mg/dL   Creatinine, Ser 4.09 (*) 0.50 - 1.35 mg/dL   Calcium 9.2  8.4 - 81.1 mg/dL   GFR calc non Af Amer 42 (*) >90 mL/min   GFR calc Af Amer 49 (*) >90 mL/min   Comment:            The eGFR has been calculated     using the CKD EPI equation.     This calculation has not been     validated in all clinical     situations.     eGFR's persistently     <90 mL/min signify     possible Chronic Kidney Disease.  CBC     Status: None   Collection Time    03/14/13 11:14 AM      Result Value Range   WBC 7.1  4.0 - 10.5 K/uL   RBC 4.46  4.22 - 5.81 MIL/uL   Hemoglobin 13.5  13.0 - 17.0 g/dL   HCT 91.4  78.2 - 95.6 %   MCV 90.4  78.0 - 100.0 fL   MCH 30.3  26.0 - 34.0 pg   MCHC 33.5  30.0 - 36.0 g/dL   RDW 21.3  08.6 - 57.8 %   Platelets 227  150 - 400 K/uL  Dg Chest 2 View  03/14/2013   *RADIOLOGY REPORT*  Clinical Data: Preop for cervical spine surgery, former smoking history  CHEST - 2 VIEW  Comparison: Chest x-ray of 09/27/2010  Findings: No pneumonia or effusion is seen.  There is some peribronchial thickening which may indicate bronchitis.  The heart is mildly enlarged and stable.  There are diffuse degenerative changes throughout the thoracic spine.   IMPRESSION: No active lung disease.  Mild peribronchial thickening   Original Report Authenticated By: Dwyane Dee, M.D.    Significant for a birth defect to his right arm the greatly limited function.  Blood pressure 117/71, pulse 68, temperature 98.1 F (36.7 C), temperature source Oral, resp. rate 20, height 6\' 2"  (1.88 m), weight 116.121 kg (256 lb), SpO2 93.00%.  The patient is awake alert and oriented. His no facial asymmetry. His gait is nonantalgic. His no reflexes on the right upper extremity. There decreased on the left upper extremity. Strength is 5 over 5 on the left upper extremity process on the right. Assessment/Plan Impression is of spondylosis and disc herniations C3-4 and C4-5. The plan is for a two-level anterior cervical discectomy with fusion and plating.  Reinaldo Meeker, MD 03/15/2013, 5:26 PM

## 2013-03-16 MED ORDER — CEFAZOLIN SODIUM-DEXTROSE 2-3 GM-% IV SOLR
2.0000 g | Freq: Three times a day (TID) | INTRAVENOUS | Status: DC
  Administered 2013-03-16: 2 g via INTRAVENOUS
  Filled 2013-03-16 (×2): qty 50

## 2013-03-16 NOTE — Progress Notes (Signed)
Utilization review completed. Angel Hobdy, RN, BSN. 

## 2013-03-16 NOTE — Discharge Summary (Signed)
Physician Discharge Summary  Patient ID: Luis Sweeney MRN: [id number] DOB/AGE: [birth year] 72 y.o.  Admit date: 03/15/2013 Discharge date: 03/16/2013  Admission Diagnoses:  Discharge Diagnoses:  Active Problems:   * No active hospital problems. *   Discharged Condition: good  Hospital Course: Admitted for surgery for HNP C 34 and C 45 with marked left shoulder region pain. No issues with surgery. Pain much better post op. Up walking pain free morning after surgery. Wound fine. Home pod 1, specific instructions given.  Consults: None  Significant Diagnostic Studies: none  Treatments: surgery: C 34 C 45 acdf with plating  Discharge Exam: Blood pressure 162/82, pulse 83, temperature 98.7 F (37.1 C), temperature source Oral, resp. rate 18, height 6\' 2"  (1.88 m), weight 116.121 kg (256 lb), SpO2 92.00%. Incision/Wound:clean and dry; no new neuro issues  Disposition:   Discharge Orders   Future Orders Complete By Expires     Call MD for:  difficulty breathing, headache or visual disturbances  As directed     Call MD for:  hives  As directed     Call MD for:  persistant nausea and vomiting  As directed     Call MD for:  redness, tenderness, or signs of infection (pain, swelling, redness, odor or green/yellow discharge around incision site)  As directed     Call MD for:  severe uncontrolled pain  As directed     Call MD for:  temperature >100.4  As directed     Diet general  As directed     Discharge instructions  As directed     Comments:      Mostly bedrest. Get up 9 or 10 times each day and walk for 15-20 minutes each time. Very little sitting the first week. No riding in the car until your first post op appointment. If you had neck surgery...may shower from the chest down. If you had low back surgery....you may shower with a saran wrap covering over the incision. Take your pain medicine as needed...and other medicines that you are instructed to take. Call for an  appointment...[phone].        Medication List    STOP taking these medications       aspirin EC 81 MG tablet      TAKE these medications       HYDROcodone-acetaminophen 10-325 MG per tablet  Commonly known as:  NORCO  Take 1 tablet by mouth 2 (two) times daily as needed for pain.     ramipril 5 MG capsule  Commonly known as:  ALTACE  Take 5 mg by mouth 2 (two) times daily.     rosuvastatin 5 MG tablet  Commonly known as:  CRESTOR  Take 5 mg by mouth every evening.     VITAMIN B-12 PO  Take 5,000 mcg by mouth daily.     Vitamin D-3 5000 UNITS Tabs  Take by mouth.         At home rest most of the time. Get up 9 or 10 times each day and take a 15 or 20 minute walk. No riding in the car and to your first postoperative appointment. If you have neck surgery you may shower from the chest down starting on the third postoperative day. If you had back surgery he may start showering on the third postoperative day with saran wrap wrapped around your incisional area 3 times. After the shower remove the saran wrap. Take pain medicine as needed and other medications as  instructed. Call my office for an appointment.  SignedReinaldo Meeker, MD 03/16/2013, 9:19 AM

## 2013-03-17 ENCOUNTER — Encounter: Payer: Self-pay | Admitting: Family Medicine

## 2013-03-19 ENCOUNTER — Encounter: Payer: Self-pay | Admitting: Family Medicine

## 2013-05-22 ENCOUNTER — Other Ambulatory Visit: Payer: Self-pay | Admitting: Cardiovascular Disease

## 2013-05-24 ENCOUNTER — Other Ambulatory Visit: Payer: Self-pay | Admitting: Family Medicine

## 2013-05-24 DIAGNOSIS — D509 Iron deficiency anemia, unspecified: Secondary | ICD-10-CM

## 2013-05-24 DIAGNOSIS — E7800 Pure hypercholesterolemia, unspecified: Secondary | ICD-10-CM

## 2013-05-24 DIAGNOSIS — Z125 Encounter for screening for malignant neoplasm of prostate: Secondary | ICD-10-CM

## 2013-05-24 DIAGNOSIS — N183 Chronic kidney disease, stage 3 unspecified: Secondary | ICD-10-CM

## 2013-05-24 DIAGNOSIS — E538 Deficiency of other specified B group vitamins: Secondary | ICD-10-CM

## 2013-05-25 ENCOUNTER — Other Ambulatory Visit (INDEPENDENT_AMBULATORY_CARE_PROVIDER_SITE_OTHER): Payer: Medicare Other

## 2013-05-25 DIAGNOSIS — Z125 Encounter for screening for malignant neoplasm of prostate: Secondary | ICD-10-CM

## 2013-05-25 DIAGNOSIS — E538 Deficiency of other specified B group vitamins: Secondary | ICD-10-CM

## 2013-05-25 DIAGNOSIS — E7800 Pure hypercholesterolemia, unspecified: Secondary | ICD-10-CM

## 2013-05-25 DIAGNOSIS — N183 Chronic kidney disease, stage 3 unspecified: Secondary | ICD-10-CM

## 2013-05-25 LAB — LIPID PANEL
Cholesterol: 113 mg/dL (ref 0–200)
HDL: 32.6 mg/dL — ABNORMAL LOW (ref >39.00)
LDL Cholesterol: 63 mg/dL (ref 0–99)
Total CHOL/HDL Ratio: 3
Triglycerides: 88 mg/dL (ref 0.0–149.0)
VLDL: 17.6 mg/dL (ref 0.0–40.0)

## 2013-05-25 LAB — COMPREHENSIVE METABOLIC PANEL
ALT: 13 U/L (ref 0–53)
AST: 19 U/L (ref 0–37)
Albumin: 3.6 g/dL (ref 3.5–5.2)
Alkaline Phosphatase: 34 U/L — ABNORMAL LOW (ref 39–117)
BUN: 20 mg/dL (ref 6–23)
CO2: 30 mEq/L (ref 19–32)
Calcium: 9.1 mg/dL (ref 8.4–10.5)
Chloride: 103 mEq/L (ref 96–112)
Creatinine, Ser: 1.6 mg/dL — ABNORMAL HIGH (ref 0.4–1.5)
GFR: 44.99 mL/min — ABNORMAL LOW (ref >60.00)
Glucose, Bld: 98 mg/dL (ref 70–99)
Potassium: 4.3 mEq/L (ref 3.5–5.1)
Sodium: 136 mEq/L (ref 135–145)
Total Bilirubin: 0.7 mg/dL (ref 0.3–1.2)
Total Protein: 6.4 g/dL (ref 6.0–8.3)

## 2013-05-25 LAB — VITAMIN B12: Vitamin B-12: >1500 pg/mL — ABNORMAL HIGH (ref 211–911)

## 2013-05-25 LAB — PSA: PSA: 0.99 ng/mL (ref 0.10–4.00)

## 2013-06-01 ENCOUNTER — Encounter: Payer: Self-pay | Admitting: Family Medicine

## 2013-06-01 ENCOUNTER — Ambulatory Visit (INDEPENDENT_AMBULATORY_CARE_PROVIDER_SITE_OTHER): Payer: Medicare Other | Admitting: Family Medicine

## 2013-06-01 VITALS — BP 104/66 | HR 90 | Temp 98.2°F | Ht 74.0 in | Wt 250.5 lb

## 2013-06-01 DIAGNOSIS — R7989 Other specified abnormal findings of blood chemistry: Secondary | ICD-10-CM

## 2013-06-01 DIAGNOSIS — E538 Deficiency of other specified B group vitamins: Secondary | ICD-10-CM

## 2013-06-01 DIAGNOSIS — N183 Chronic kidney disease, stage 3 unspecified: Secondary | ICD-10-CM

## 2013-06-01 DIAGNOSIS — E7800 Pure hypercholesterolemia, unspecified: Secondary | ICD-10-CM

## 2013-06-01 DIAGNOSIS — Z0000 Encounter for general adult medical examination without abnormal findings: Secondary | ICD-10-CM

## 2013-06-01 NOTE — Progress Notes (Signed)
Subjective:    Patient ID: Luis Sweeney, male    DOB: Mar 08, 1941, 72 y.o.   MRN: [id number]  HPI CC: medicare wellness visit  H/o erb's palsy.  Vit B12 def - switch from shots to pills.  Has been taking pills twice daily.  Only received 2 shots.  Passes hearing and vision today.  Due for eye exam. Denies falls or depression/anhedonia.  Tested positive for STOP BANG - denies trouble with sleeping. Wakes up feeling rested. + daytime somnolence. Snores. No PNdyspnea. Nocturia x1-2. Wife recently underwent sleep apnea testing.  She started using CPAP. Not currently interested in testing.   Anterior neck fusion 03/2013 (Dr. Gerlene Fee) - continues recuperating.  Released from his care.   Wt Readings from Last 3 Encounters:  06/01/13 250 lb 8 oz (113.626 kg)  03/15/13 256 lb (116.121 kg)  03/15/13 256 lb (116.121 kg)  Body mass index is 32.15 kg/(m^2).   Daily caffeine use 3 per day  Lives with wife  Activity: no regular exercise  Diet: tries to avoid sweets, some water, fruits/vegetables occasionally   Preventative:  Colonoscopy - 11/2007 Leone Payor) diverticulosis, int hem, h/o adenomatous polyps, rec rpt 2014. F/u due - will schedule this Prostate cancer screening - no problems in past. Nocturia x1 per night. Strong stream.  Td - 05/2012 Pneumovax - 2009.  Discussed shingles shot - will think about this.  Flu yearly.  Advanced directives: doesn't have at home.  Would like packet.  Wouldn't want prolonged life support.  Would want wife then family to be HCPOA.  Medications and allergies reviewed and updated in chart.  Past histories reviewed and updated if relevant as below. Patient Active Problem List   Diagnosis Date Noted  . Medicare annual wellness visit, subsequent 05/31/2012  . B12 deficiency 05/31/2012  . Right inguinal hernia 05/31/2012  . WRIST PAIN, LEFT 10/15/2010  . RUQ PAIN 10/15/2010  . UTI 12/18/2009  . OTHER DYSPNEA AND RESPIRATORY ABNORMALITIES 03/14/2009  .  Coronary atherosclerosis of native coronary artery 03/13/2009  . CKD (chronic kidney disease), stage III 03/13/2009  . ANEMIA, IRON DEFICIENCY 01/09/2008  . HYPERCHOLESTEROLEMIA 01/04/2008  . HELICOBACTER PYLORI GASTRITIS, RECURRENT 12/21/2007  . Personal history of colonic polyps 11/07/2007  . PERSONAL HISTORY OF PEPTIC ULCER DISEASE 11/07/2007  . GERD 03/08/2007  . MYOCARDIAL INFARCTION, HX OF 03/07/2007  . DIVERTICULOSIS, COLON 03/07/2007  . ERB'S PALSY 03/07/2007  . HYPERGLYCEMIA 03/07/2007   Past Medical History  Diagnosis Date  . CAD (coronary artery disease) 2004    s/p MI treated with drug-eluting stent placement to the proximal left anterior descending in late 2004  . Renal insufficiency   . Hypercholesteremia   . Urticaria     Lat left arm  . Anemia, iron deficiency   . Helicobacter pylori gastritis     recurrent  . Personal history of colonic polyps   . Acute duodenal ulcer with hemorrhage but without obstruction     required  bld. transfusion  . GERD (gastroesophageal reflux disease)   . Hyperglycemia   . Myocardial infarct   . Diverticulosis of colon   . Hypertension   . 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 H/O hiatal hernia   . 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 Arthritis     OA- pt. reports its. minor   Past Surgical History  Procedure Laterality Date  . Shoulder surgery  right  . Wrist surgery    . Spinal cyst removal    . Knee surgery      left  . Coronary stent placement      drug-eluting  . Hernia repair  2006    right (Dr. Daphine Deutscher)  . Esophagogastroduodenoscopy  11/26/10    mild gastritis, duodenitis, duodenal stricture (Dr. Leone Payor)  . Colonoscopy  11/2007    diverticulosis, int hem, h/o adenomatous polyps Leone Payor)  . Cardiac catheterization  2006    stent placed  . Anterior cervical decomp/discectomy fusion  03/2013    C3/4, C4/5 HNP (kritzer)  . Anterior cervical decomp/discectomy  fusion N/A 03/15/2013    Procedure: ANTERIOR CERVICAL DECOMPRESSION/DISCECTOMY FUSION 2 LEVELS;  Surgeon: Reinaldo Meeker, MD;  Location: [facility];  Service: Neurosurgery;  Laterality: N/A;  Cervical three-four, cervical four-five anterior cervical diskectomy fusion with a trabecular metal plus plate    History  Substance Use Topics  . Smoking status: Former Smoker -- 1.50 packs/day for 3 years    Quit date: 02/16/1985  . Smokeless tobacco: Never Used  . Alcohol Use: 0.0 oz/week    0 drink(s) per week     Comment: two to three times a year   Family History  Problem Relation Age of Onset  . Diabetes Father   . Heart disease Father     CHF  . Kidney disease Father     kidney failure  . Heart failure Father   . Diabetes Sister   . Hypertension Sister   . Cancer Sister     pelvic mass  . Hypertension Sister   . Arthritis Sister   . Cancer Other     ovarian   Allergies  Allergen Reactions  . Codeine Hives, Itching and Other (See Comments)    Stomach cramps  . Morphine Hives and Itching  . Simvastatin Other (See Comments)    Muscle pain   Current Outpatient Prescriptions on File Prior to Visit  Medication Sig Dispense Refill  . Cholecalciferol (VITAMIN D-3) 5000 UNITS TABS Take by mouth daily.       . CRESTOR 5 MG tablet TAKE 1 TABLET DAILY  90 tablet  0  . Cyanocobalamin (VITAMIN B-12 PO) Take 1,000 mcg by mouth daily.       . ramipril (ALTACE) 5 MG capsule TAKE 1 CAPSULE TWICE A DAY  180 capsule  0   No current facility-administered medications on file prior to visit.     Review of Systems  Constitutional: Negative for fever, chills, activity change, appetite change, fatigue and unexpected weight change.  HENT: Negative for hearing loss and neck pain.   Eyes: Negative for visual disturbance.  Respiratory: Negative for cough, chest tightness, shortness of breath and wheezing.   Cardiovascular: Negative for chest pain (?msk related.  has been limited to lift no more  than 10 lbs after recent neck surgery), palpitations and leg swelling.  Gastrointestinal: Negative for nausea, vomiting, abdominal pain, diarrhea, constipation, blood in stool and abdominal distention.  Genitourinary: Negative for hematuria and difficulty urinating.  Musculoskeletal: Negative for myalgias and arthralgias.  Skin: Negative for rash.  Neurological: Negative for dizziness, seizures, syncope and headaches.  Hematological: Negative for adenopathy. Does not bruise/bleed easily.  Psychiatric/Behavioral: Negative for dysphoric mood. The patient is not nervous/anxious.        Objective:   Physical Exam  Nursing note and vitals reviewed. Constitutional: He is oriented to person, place, and time. He appears well-developed and well-nourished. No distress.  HENT:  Head: Normocephalic and atraumatic.  Mouth/Throat: Oropharynx is clear and moist. No oropharyngeal exudate.  Eyes: Conjunctivae and EOM are normal. Pupils are equal, round, and reactive to light. No scleral icterus.  Neck: Normal range of motion. Neck supple. Carotid bruit is not present. No thyromegaly present.  Cardiovascular: Normal rate, regular rhythm, normal heart sounds and intact distal pulses.   No murmur heard. Pulses:      Radial pulses are 2+ on the right side, and 2+ on the left side.  Pulmonary/Chest: Effort normal and breath sounds normal. No respiratory distress. He has no wheezes. He has no rales.  Abdominal: Soft. Bowel sounds are normal. He exhibits no distension and no mass. There is no tenderness. There is no rebound and no guarding.  Genitourinary: Rectum normal and prostate normal. Rectal exam shows no external hemorrhoid, no internal hemorrhoid, no fissure, no mass, no tenderness and anal tone normal. Prostate is not enlarged (15-20gm) and not tender.  Musculoskeletal: Normal range of motion. He exhibits no edema.  Lymphadenopathy:    He has no cervical adenopathy.  Neurological: He is alert and  oriented to person, place, and time.  CN grossly intact, station and gait intact  Skin: Skin is warm and dry. No rash noted.  Psychiatric: He has a normal mood and affect. His behavior is normal. Judgment and thought content normal.       Assessment & Plan:

## 2013-06-01 NOTE — Patient Instructions (Addendum)
Schedule colonoscopy at your convenience. Advanced directive packet provided today. We will space prostate checks out to every 2 years. Good to see you today, call us with questions. Return as needed or in 1 year for next physical. Work on weight - increase activity.

## 2013-06-01 NOTE — Assessment & Plan Note (Signed)
Better control noted today.

## 2013-06-01 NOTE — Assessment & Plan Note (Signed)
well controlled on statin. Continue.  Discussed increased aerobic exercise to improve HDL.

## 2013-06-01 NOTE — Assessment & Plan Note (Signed)
rec decrease to oral supplementation.

## 2013-06-01 NOTE — Assessment & Plan Note (Signed)
I have personally reviewed the Medicare Annual Wellness questionnaire and have noted 1. The patient's medical and social history 2. Their use of alcohol, tobacco or illicit drugs 3. Their current medications and supplements 4. The patient's functional ability including ADL's, fall risks, home safety risks and hearing or visual impairment. 5. Diet and physical activity 6. Evidence for depression or mood disorders The patients weight, height, BMI have been recorded in the chart.  Hearing and vision has been addressed. I have made referrals, counseling and provided education to the patient based review of the above and I have provided the pt with a written personalized care plan for preventive services. See scanned questionairre. Advanced directives discussed: packet of info provided  Reviewed preventative protocols and updated unless pt declined. Will schedule colonoscopy. will space out prostate screening to Q2 years.

## 2013-06-01 NOTE — Assessment & Plan Note (Signed)
Overall stable. Knows to avoid NSAIDs and stay well hydrated.

## 2013-06-23 ENCOUNTER — Encounter: Payer: Self-pay | Admitting: Internal Medicine

## 2013-08-04 ENCOUNTER — Ambulatory Visit (INDEPENDENT_AMBULATORY_CARE_PROVIDER_SITE_OTHER): Payer: Medicare Other | Admitting: Cardiovascular Disease

## 2013-08-04 ENCOUNTER — Encounter: Payer: Self-pay | Admitting: Cardiovascular Disease

## 2013-08-04 VITALS — BP 118/68 | HR 88 | Ht 74.0 in | Wt 250.0 lb

## 2013-08-04 DIAGNOSIS — I2510 Atherosclerotic heart disease of native coronary artery without angina pectoris: Secondary | ICD-10-CM

## 2013-08-04 NOTE — Patient Instructions (Signed)
Your physician wants you to follow-up in: 1 YEAR with Dr Cooper.  You will receive a reminder letter in the mail two months in advance. If you don't receive a letter, please call our office to schedule the follow-up appointment.  Your physician recommends that you continue on your current medications as directed. Please refer to the Current Medication list given to you today.  

## 2013-08-04 NOTE — Progress Notes (Signed)
HPI:  72 year old gentleman presenting for followup of coronary artery disease. The patient underwent drug-eluting stent placement in the LAD in 2004. Labs from August 2014 were reviewed and demonstrated a stable creatinine of 1.6, cholesterol 113, triglycerides 88, HDL 33, and LDL 63.  The patient is doing fairly well. He continues to have a lot of work-related stress. 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 He's had a few chest pains that he relates to stress. He has not felt anything like that of his anginal pain many years ago. He denies shortness of breath or palpitations. He's had no leg swelling or near syncope. He has not been engaged in any exercise. He had cervical spine surgery several months ago via an anterior approach and continues to have some swelling in that area. This is improving. He's had some swallowing problems are also improving. He has no other complaints today.  Outpatient Encounter Prescriptions as of 08/04/2013  Medication Sig Dispense Refill  . aspirin 81 MG tablet Take 81 mg by mouth daily.      . Cholecalciferol (VITAMIN D-3) 5000 UNITS TABS Take by mouth daily.       . CRESTOR 5 MG tablet TAKE 1 TABLET DAILY  90 tablet  0  . Cyanocobalamin (VITAMIN B-12 PO) Take 1,000 mcg by mouth daily.       . ramipril (ALTACE) 5 MG capsule TAKE 1 CAPSULE TWICE A DAY  180 capsule  0   No facility-administered encounter medications on file as of 08/04/2013.    Allergies  Allergen Reactions  . Codeine Hives, Itching and Other (See Comments)    Stomach cramps  . Morphine Hives and Itching  . Simvastatin Other (See Comments)    Muscle pain    Past Medical History  Diagnosis Date  . CAD (coronary artery disease) 2004    s/p MI treated with drug-eluting stent placement to the proximal left anterior descending in late 2004  . Renal insufficiency   . Hypercholesteremia   . Urticaria     Lat left arm  . Anemia, iron deficiency   . Helicobacter pylori  gastritis     recurrent  . Personal history of colonic polyps   . Acute duodenal ulcer with hemorrhage but without obstruction     required  bld. transfusion  . GERD (gastroesophageal reflux disease)   . Hyperglycemia   . Myocardial infarct   . Diverticulosis of colon   . Hypertension   . 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 H/O hiatal hernia   . 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 Arthritis     OA- pt. reports its. minor   ROS: Negative except as per HPI  BP 118/68  Pulse 88  Ht 6\' 2"  (1.88 m)  Wt 250 lb (113.399 kg)  BMI 32.08 kg/m2  PHYSICAL EXAM: Pt is alert and oriented, NAD HEENT: normal Neck: JVP - normal, carotids 2+= without bruits Lungs: CTA bilaterally CV: RRR without murmur or gallop Abd: soft, NT, Positive BS, no hepatomegaly Ext: no C/C/E, distal pulses intact and equal Skin: warm/dry no rash  EKG:  Sinus rhythm 88 beats per minute, frequent PVCs, nonspecific ST and T wave abnormality  ASSESSMENT AND PLAN: 1. Coronary atherosclerosis, native vessel. The patient is stable without anginal symptoms. Encouraged regular exercise. He will continue on aspirin, and ACE inhibitor, and a statin  drug.  2. Hyperlipidemia. Lipids reviewed as above with LDL at goal on low-dose Crestor.  3. Hypertension. The patient is tolerating ramipril with blood pressure and cold.  For followup I will see him back in 12 months.  Tonny Bollman 08/04/2013 11:53 AM

## 2013-08-20 ENCOUNTER — Other Ambulatory Visit: Payer: Self-pay | Admitting: Cardiovascular Disease

## 2013-10-12 DIAGNOSIS — K760 Fatty (change of) liver, not elsewhere classified: Secondary | ICD-10-CM

## 2013-10-12 HISTORY — DX: Fatty (change of) liver, not elsewhere classified: K76.0

## 2013-10-16 ENCOUNTER — Other Ambulatory Visit: Payer: Self-pay

## 2013-10-16 MED ORDER — ROSUVASTATIN CALCIUM 5 MG PO TABS: 5.0000 mg | ORAL_TABLET | Freq: Every day | ORAL | Status: DC

## 2013-10-16 MED ORDER — RAMIPRIL 5 MG PO CAPS: 5.0000 mg | ORAL_CAPSULE | Freq: Every day | ORAL | Status: DC

## 2013-11-27 ENCOUNTER — Ambulatory Visit (INDEPENDENT_AMBULATORY_CARE_PROVIDER_SITE_OTHER): Payer: Medicare Other | Admitting: Family Medicine

## 2013-11-27 ENCOUNTER — Encounter: Payer: Self-pay | Admitting: Family Medicine

## 2013-11-27 VITALS — BP 110/60 | HR 52 | Temp 98.2°F | Wt 257.2 lb

## 2013-11-27 DIAGNOSIS — R1011 Right upper quadrant pain: Secondary | ICD-10-CM | POA: Insufficient documentation

## 2013-11-27 LAB — LIPASE: Lipase: 28 U/L (ref 11.0–59.0)

## 2013-11-27 LAB — COMPREHENSIVE METABOLIC PANEL
ALT: 15 U/L (ref 0–53)
AST: 23 U/L (ref 0–37)
Albumin: 4.1 g/dL (ref 3.5–5.2)
Alkaline Phosphatase: 32 U/L — ABNORMAL LOW (ref 39–117)
BUN: 23 mg/dL (ref 6–23)
CO2: 27 mEq/L (ref 19–32)
Calcium: 9.2 mg/dL (ref 8.4–10.5)
Chloride: 101 mEq/L (ref 96–112)
Creatinine, Ser: 1.7 mg/dL — ABNORMAL HIGH (ref 0.4–1.5)
GFR: 43.07 mL/min — ABNORMAL LOW (ref >60.00)
Glucose, Bld: 84 mg/dL (ref 70–99)
Potassium: 4.2 mEq/L (ref 3.5–5.1)
Sodium: 138 mEq/L (ref 135–145)
Total Bilirubin: 0.5 mg/dL (ref 0.3–1.2)
Total Protein: 7.3 g/dL (ref 6.0–8.3)

## 2013-11-27 LAB — CBC WITH DIFFERENTIAL/PLATELET
Basophils Absolute: 0 10*3/uL (ref 0.0–0.1)
Basophils Relative: 0.4 % (ref 0.0–3.0)
Eosinophils Absolute: 0 10*3/uL (ref 0.0–0.7)
Eosinophils Relative: 0.6 % (ref 0.0–5.0)
HCT: 45.9 % (ref 39.0–52.0)
Hemoglobin: 14.8 g/dL (ref 13.0–17.0)
Lymphocytes Relative: 29.8 % (ref 12.0–46.0)
Lymphs Abs: 2.5 10*3/uL (ref 0.7–4.0)
MCHC: 32.3 g/dL (ref 30.0–36.0)
MCV: 93.5 fl (ref 78.0–100.0)
Monocytes Absolute: 0.7 10*3/uL (ref 0.1–1.0)
Monocytes Relative: 8.9 % (ref 3.0–12.0)
Neutro Abs: 5 10*3/uL (ref 1.4–7.7)
Neutrophils Relative %: 60.3 % (ref 43.0–77.0)
Platelets: 244 10*3/uL (ref 150.0–400.0)
RBC: 4.91 Mil/uL (ref 4.22–5.81)
RDW: 14.5 % (ref 11.5–14.6)
WBC: 8.4 10*3/uL (ref 4.5–10.5)

## 2013-11-27 NOTE — Progress Notes (Signed)
BP 110/60  Pulse 52  Temp(Src) 98.2 F (36.8 C) (Oral)  Wt 257 lb 4 oz (116.688 kg)   CC: RUQ abd pain  Subjective:    Patient ID: Luis Sweeney, male    DOB: Jul 17, 1941, 73 y.o.   MRN: [id number]  HPI: Luis Sweeney is a 73 y.o. male presenting on 11/27/2013 with Abdominal Pain  5d h/o pain in RUQ abd that radiates up into chest.  Comes and goes.  2 nights ago pain kept him awake all night.  Yesterday pain improved, he did not eat anything yesterday or today.  For lunch does tend to eat hot dog with chilli or hamburger, or chicken strips at [employer].  Pain described as continuous and progressive over the course of several hours.  + gassiness. No fevers/chills, nausea/vomiting, diarrhea/constipation, indigestion, GERD.  Denies urinary sxs such as dysuria or urgency or hematuria.  No h/o gallbladder issues.  No abdominal surgeries in the past. EGD 2012 - mild gastritis, duodenitis, duodenal stricture (Dr. [name]).  Had bleeding ulcer at that time. H/o MI - presented with substernal chest pain with diaphoresis - this feels different. Avoids NSAIDs 2/2 CAD s/p stent.  Wt Readings from Last 3 Encounters:  11/27/13 257 lb 4 oz (116.688 kg)  08/04/13 250 lb (113.399 kg)  06/01/13 250 lb 8 oz (113.626 kg)    Relevant past medical, surgical, family and social history reviewed and updated. Allergies and medications reviewed and updated. Current Outpatient Prescriptions on File Prior to Visit  Medication Sig  . aspirin 81 MG tablet Take 81 mg by mouth daily.  . Cholecalciferol (VITAMIN D-3) 5000 UNITS TABS Take by mouth daily.   . Cyanocobalamin (VITAMIN B-12 PO) Take 1,000 mcg by mouth daily.   . rosuvastatin (CRESTOR) 5 MG tablet Take 1 tablet (5 mg total) by mouth at bedtime.   No current facility-administered medications on file prior to visit.    Review of Systems Per HPI unless specifically indicated above    Objective:    BP 110/60  Pulse 52  Temp(Src) 98.2 F (36.8 C)  (Oral)  Wt 257 lb 4 oz (116.688 kg)  Physical Exam  Nursing note and vitals reviewed. Constitutional: He appears well-developed and well-nourished. No distress.  HENT:  Mouth/Throat: Oropharynx is clear and moist. No oropharyngeal exudate.  Cardiovascular: Normal rate, regular rhythm, normal heart sounds and intact distal pulses.   No murmur heard. Pulmonary/Chest: Effort normal and breath sounds normal. No respiratory distress. He has no wheezes. He has no rales.  Abdominal: Soft. Normal appearance and bowel sounds are normal. He exhibits no distension and no mass. There is no hepatosplenomegaly. There is no tenderness. There is no rigidity, no rebound, no guarding, no CVA tenderness and negative Murphy's sign.       Assessment & Plan:   Problem List Items Addressed This Visit   RUQ abdominal pain - Primary     Exam benign today.  Story however consistent with biliary colic Will obtain CMP, CBC and lipase today. Will also order RUQ abd [place] Discussed possible referral to surgery and surgery needed recommended cards clearance prior In interim, treat discomfort with tylenol/ sparing NSAID and avoid fatty foods.  Handout provided.    Relevant Orders      Comprehensive metabolic panel      CBC with Differential      Lipase      US Abdomen Complete       Follow up plan: Return if symptoms  worsen or fail to improve.

## 2013-11-27 NOTE — Assessment & Plan Note (Addendum)
Exam benign today.  Story however consistent with biliary colic Will obtain CMP, CBC and lipase today. Will also order RUQ abd [place] Discussed possible referral to surgery and surgery needed recommended cards clearance prior In interim, treat discomfort with tylenol/ sparing NSAID and avoid fatty foods.  Handout provided.

## 2013-11-27 NOTE — Progress Notes (Signed)
Pre-visit discussion using our clinic review tool. No additional management support is needed unless otherwise documented below in the visit note.  

## 2013-11-27 NOTE — Patient Instructions (Signed)
Let's check blood work today.  Pass by [facility] office to schedule ultrasound of abdomen. I think you may have gallstones Try to avoid any fatty or greasy foods. May use tylenol or infrequent aleve/advil as needed for pain.  Cholelithiasis Cholelithiasis (also called gallstones) is a form of gallbladder disease in which gallstones form in your gallbladder. The gallbladder is an organ that stores bile made in the liver, which helps digest fats. Gallstones begin as small crystals and slowly grow into stones. Gallstone pain occurs when the gallbladder spasms and a gallstone is blocking the duct. Pain can also occur when a stone passes out of the duct.  RISK FACTORS  Being male.   Having multiple pregnancies. Health care providers sometimes advise removing diseased gallbladders before future pregnancies.   Being obese.  Eating a diet heavy in fried foods and fat.   Being older than 71 years and increasing age.   Prolonged use of medicines containing male hormones.   Having diabetes mellitus.   Rapidly losing weight.   Having a family history of gallstones (heredity).  SYMPTOMS  Nausea.   Vomiting.  Abdominal pain.   Yellowing of the skin (jaundice).   Sudden pain. It may persist from several minutes to several hours.  Fever.   Tenderness to the touch. In some cases, when gallstones do not move into the bile duct, people have no pain or symptoms. These are called "silent" gallstones.  TREATMENT Silent gallstones do not need treatment. In severe cases, emergency surgery may be required. Options for treatment include:  Surgery to remove the gallbladder. This is the most common treatment.  Medicines. These do not always work and may take 6 12 months or more to work.  Shock wave treatment (extracorporeal biliary lithotripsy). In this treatment an ultrasound machine sends shock waves to the gallbladder to break gallstones into smaller pieces that can pass into  the intestines or be dissolved by medicine. HOME CARE INSTRUCTIONS   Only take over-the-counter or prescription medicines for pain, discomfort, or fever as directed by your health care provider.   Follow a low-fat diet until seen again by your health care provider. Fat causes the gallbladder to contract, which can result in pain.   Follow up with your health care provider as directed. Attacks are almost always recurrent and surgery is usually required for permanent treatment.  SEEK IMMEDIATE MEDICAL CARE IF:   Your pain increases and is not controlled by medicines.   You have a fever or persistent symptoms for more than 2 3 days.   You have a fever and your symptoms suddenly get worse.   You have persistent nausea and vomiting.  MAKE SURE YOU:   Understand these instructions.  Will watch your condition.  Will get help right away if you are not doing well or get worse. Document Released: 09/24/2005 Document Revised: 05/31/2013 Document Reviewed: 03/22/2013 [facility] Patient Information 2014 [place].

## 2013-11-28 ENCOUNTER — Other Ambulatory Visit: Payer: Self-pay

## 2013-11-28 MED ORDER — RAMIPRIL 5 MG PO CAPS: 5.0000 mg | ORAL_CAPSULE | Freq: Two times a day (BID) | ORAL | Status: DC

## 2013-11-29 ENCOUNTER — Encounter: Payer: Self-pay | Admitting: *Deleted

## 2013-11-30 ENCOUNTER — Ambulatory Visit
Admission: RE | Admit: 2013-11-30 | Discharge: 2013-11-30 | Disposition: A | Discharge status: Home / Self Care | Payer: Medicare Other | Source: Ambulatory Visit | Attending: Family Medicine | Admitting: Family Medicine

## 2013-11-30 ENCOUNTER — Other Ambulatory Visit: Payer: Self-pay | Admitting: *Deleted

## 2013-11-30 ENCOUNTER — Other Ambulatory Visit: Payer: Self-pay | Admitting: Family Medicine

## 2013-11-30 DIAGNOSIS — R1011 Right upper quadrant pain: Secondary | ICD-10-CM

## 2013-11-30 MED ORDER — OMEPRAZOLE 40 MG PO CPDR: 40.0000 mg | DELAYED_RELEASE_CAPSULE | Freq: Every day | ORAL | Status: DC

## 2014-04-18 ENCOUNTER — Other Ambulatory Visit: Payer: Self-pay | Admitting: *Deleted

## 2014-04-18 MED ORDER — RAMIPRIL 5 MG PO CAPS: 5.0000 mg | ORAL_CAPSULE | Freq: Two times a day (BID) | ORAL | Status: DC

## 2014-05-31 ENCOUNTER — Encounter: Payer: Self-pay | Admitting: Family Medicine

## 2014-05-31 ENCOUNTER — Ambulatory Visit (INDEPENDENT_AMBULATORY_CARE_PROVIDER_SITE_OTHER): Payer: Medicare Other | Admitting: Family Medicine

## 2014-05-31 VITALS — BP 124/78 | HR 44 | Temp 98.1°F | Wt 256.8 lb

## 2014-05-31 DIAGNOSIS — K7689 Other specified diseases of liver: Secondary | ICD-10-CM

## 2014-05-31 DIAGNOSIS — R1011 Right upper quadrant pain: Secondary | ICD-10-CM

## 2014-05-31 DIAGNOSIS — K760 Fatty (change of) liver, not elsewhere classified: Secondary | ICD-10-CM

## 2014-05-31 DIAGNOSIS — R109 Unspecified abdominal pain: Secondary | ICD-10-CM

## 2014-05-31 DIAGNOSIS — E7800 Pure hypercholesterolemia, unspecified: Secondary | ICD-10-CM

## 2014-05-31 LAB — POCT URINALYSIS DIPSTICK
Bilirubin, UA: NEGATIVE
Blood, UA: NEGATIVE
Glucose, UA: NEGATIVE
Ketones, UA: NEGATIVE
Leukocytes, UA: NEGATIVE
Nitrite, UA: NEGATIVE
Protein, UA: NEGATIVE
Spec Grav, UA: >=1.03
Urobilinogen, UA: 0.2
pH, UA: 6

## 2014-05-31 MED ORDER — PRAVASTATIN SODIUM 80 MG PO TABS: 80.0000 mg | ORAL_TABLET | Freq: Every day | ORAL | Status: DC

## 2014-05-31 NOTE — Addendum Note (Signed)
Addended by: Ria Bush on: 05/31/2014 02:43 PM   Modules accepted: Orders

## 2014-05-31 NOTE — Progress Notes (Signed)
BP 124/78  Pulse 44  Temp(Src) 98.1 F (36.7 C) (Oral)  Wt 256 lb 12 oz (116.461 kg)   CC: R flank pain   Subjective:    Patient ID: Luis Sweeney, male    DOB: 08-Oct-1941, 73 y.o.   MRN: [id number]  HPI: Luis Sweeney is a 73 y.o. male presenting on 05/31/2014 for Flank Pain   R lateral abdomen pain started 4 nights ago, progressively worsening. Trouble sleeping 2/2 pain over last 2 nights. Dull constant pain that at timers radiates to RUQ. Differing intensity throughout the day. Not food related that he can tell.   Denies inciting trauma. Not worse with exercise or positional. Not associated with dyspnea or nausea/vomiting, gassiness or indigestion, diarrhea, dysuria, urgency, hematuria. No blood in stool. No chest pain. Denies associate of pain to foods - had BLT for lunch and broiled fish for dinner.   Similar sxs 11/2013 - work up included abd [place] without gallstones and with hepatic cysts and fatty liver, CBC and CMP and lipase that were overall within normal limits except for chronic renal insufficiency. Slowly resolved on its own. At that time, ddx included gallbladder dysfunction vs dyspepsia. sxs did improve with omeprazole.  No h/o gallbladder issues. No abdominal surgeries in the past.  EGD 2012 - mild gastritis, duodenitis, duodenal stricture (Dr. [name]). Had bleeding ulcer at that time. Treated for "bacteria" presumed H pylori. That felt different as well. H/o MI - presented with substernal chest pain with diaphoresis - this feels different.  Avoids NSAIDs 2/2 CAD s/p stent.   HLD - off crestor due to costs. Tried lipitor and zocor in past. Agrees to trial of pravastatin.   Past Medical History  Diagnosis Date  . CAD (coronary artery disease) 2004    s/p MI treated with drug-eluting stent placement to the proximal left anterior descending in late 2004  . Renal insufficiency   . Hypercholesteremia   . Urticaria     Lat left arm  . Anemia, iron deficiency   .  Helicobacter pylori gastritis     recurrent  . Personal history of colonic polyps   . Acute duodenal ulcer with hemorrhage but without obstruction     required  bld. transfusion  . GERD (gastroesophageal reflux disease)   . Hyperglycemia   . Myocardial infarct   . Diverticulosis of colon   . Hypertension   . 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 H/O hiatal hernia   . 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 Arthritis     OA- pt. reports its. minor  . NAFLD (nonalcoholic fatty liver disease) 2015    with liver cysts     Relevant past medical, surgical, family and social history reviewed and updated as indicated.  Allergies and medications reviewed and updated. Current Outpatient Prescriptions on File Prior to Visit  Medication Sig  . aspirin 81 MG tablet Take 81 mg by mouth daily.  . Cholecalciferol (VITAMIN D-3) 5000 UNITS TABS Take by mouth daily.   . Cyanocobalamin (VITAMIN B-12 PO) Take 1,000 mcg by mouth daily.   . ramipril (ALTACE) 5 MG capsule Take 1 capsule (5 mg total) by mouth 2 (two) times daily.  . rosuvastatin (CRESTOR) 5 MG tablet Take 1 tablet (5 mg total) by mouth at bedtime.   No current facility-administered medications on file prior to visit.    Review  of Systems Per HPI unless specifically indicated above    Objective:    BP 124/78  Pulse 44  Temp(Src) 98.1 F (36.7 C) (Oral)  Wt 256 lb 12 oz (116.461 kg)  Physical Exam  Nursing note and vitals reviewed. Constitutional: He appears well-developed and well-nourished. No distress.  Abdominal: Soft. Normal appearance and bowel sounds are normal. He exhibits no distension and no mass. There is no hepatosplenomegaly. There is tenderness (mild) in the left lower quadrant. There is no rigidity, no rebound, no guarding, no CVA tenderness and negative Murphy's sign. No hernia.  Musculoskeletal: He exhibits no edema.  Skin: Skin is warm and dry. No rash noted.    Psychiatric: He has a normal mood and affect.       Assessment & Plan:   Problem List Items Addressed This Visit   HYPERCHOLESTEROLEMIA     crestor 5 not covered by insurance - agrees to trial of 80mg  pravastatin sent to pharmacy 1 mo to see if tolerated    Relevant Medications      PRAVASTATIN SODIUM 80 MG PO TABS   NAFLD (nonalcoholic fatty liver disease)   RUQ abdominal pain - Primary     Exam benign today, reviewed unremarkable workup from 11/2013. Will order HIDA scan today. eval for gallbladder dyskinesis vs dyspepsia. If normal, will refer to GI for further eval of possible dyspepsia in h/o duodenitis and gastritis and ulcer. Pt agrees with plan.    Relevant Orders      NM Hepatobiliary    Other Visit Diagnoses   Flank pain        Relevant Orders       POCT Urinalysis Dipstick (Completed)        Follow up plan: Return if symptoms worsen or fail to improve.

## 2014-05-31 NOTE — Patient Instructions (Signed)
Good to see you today. Let's order gallbladder study. If normal we will refer you to GI doctor again.  HIDA (Hepatobiliary) Scan Your caregiver has suggested that you have a HIDA Scan. This is also known as a hepatobiliary scan. The HIDA Scan helps evaluate the hepatobiliary system (liver and gallbladder and their ducts). Your liver is the organ in your body that produces bile. The bile is then collected in the gallbladder. The bile is stored and concentrated in the gallbladder. The bile is excreted (passed) into the small intestine when it is needed for digestion. A stone can block the duct (tube) leading from the gallbladder to the small intestine. This can cause an inflammation of the gallbladder (cholecystitis). Because bile is always needed for fat processing, you may feel a gallbladder attack especially after eating a fatty meal. LET YOUR CAREGIVER KNOW ABOUT:  Allergies.  Medications taken including herbs, eye drops, over the counter medications, and creams.  Use of steroids (by mouth or creams).  Previous problems with anesthetics or novocaine.  Possibility of pregnancy, if this applies.  History of blood clots (thrombophlebitis).  History of bleeding or blood problems.  Previous surgery.  Other health problems. BEFORE THE PROCEDURE  Do not eat or drink anything after midnight the night before the exam as instructed.  You may take medications with a small amount of water the morning of the exam unless your caregiver instructs you otherwise. You should be present 60 minutes prior to your procedure or as directed.  PROCEDURE   An IV will be placed in your arm and remain throughout the exam.  A small amount of very short acting radioactive material will be injected into the IV.  While lying down a special camera will be placed over your abdomen (belly). This camera is used to detect the injected material. The camera will place images on film. A radiologist (specialist in  reading x-rays) can evaluate the images. It will help determine how well your gallbladder is working.  You will then be given a material called CCK. This will make your gallbladder contract. It occasionally causes symptoms (problems) that mimic a gallbladder attack or the feeling you have after eating a fatty meal.  The entire test usually takes one to two hours. Your caregiver can give you more accurate times. Following the test you may go home and resume normal activities and diet as instructed. Ask your caregiver how you are to find out your results. Remember, it is your responsibility to find out the results of your test. Do not assume everything is all right or "normal" if you have not heard from your caregiver. Document Released: 09/25/2000 Document Revised: 12/21/2011 Document Reviewed: 01/31/2014 [facility] Patient Information 2015 [place], [place]. This information is not intended to replace advice given to you by your health care provider. Make sure you discuss any questions you have with your health care provider.

## 2014-05-31 NOTE — Progress Notes (Signed)
Pre visit review using our clinic review tool, if applicable. No additional management support is needed unless otherwise documented below in the visit note. 

## 2014-05-31 NOTE — Assessment & Plan Note (Signed)
Exam benign today, reviewed unremarkable workup from 11/2013. Will order HIDA scan today. eval for gallbladder dyskinesis vs dyspepsia. If normal, will refer to GI for further eval of possible dyspepsia in h/o duodenitis and gastritis and ulcer. Pt agrees with plan.

## 2014-05-31 NOTE — Assessment & Plan Note (Signed)
crestor 5 not covered by insurance - agrees to trial of 80mg  pravastatin sent to pharmacy 1 mo to see if tolerated

## 2014-06-01 ENCOUNTER — Telehealth: Payer: Self-pay

## 2014-06-01 NOTE — Telephone Encounter (Signed)
Patient notified that was what Dr. Darnell Level wanted him to take for 1 month to see if he tolerated it. He is supposed to call back with an update. Pt verbalized understanding.

## 2014-06-01 NOTE — Telephone Encounter (Signed)
Pt left v/m; was seen on 05/31/14 and crestor 5 mg was changed to pravastatin 80 mg; pt wants verification he is to take pravastatin 80 mg. Pt concerned about increase from a 5 mg med to 80 mg med. Pt request cb.

## 2014-06-04 ENCOUNTER — Encounter (HOSPITAL_COMMUNITY)
Admission: RE | Admit: 2014-06-04 | Discharge: 2014-06-04 | Disposition: A | Discharge status: Home / Self Care | Payer: Medicare Other | Source: Ambulatory Visit | Attending: Family Medicine | Admitting: Family Medicine

## 2014-06-04 DIAGNOSIS — K7689 Other specified diseases of liver: Secondary | ICD-10-CM | POA: Diagnosis not present

## 2014-06-04 DIAGNOSIS — K760 Fatty (change of) liver, not elsewhere classified: Secondary | ICD-10-CM

## 2014-06-04 DIAGNOSIS — R1011 Right upper quadrant pain: Secondary | ICD-10-CM

## 2014-06-04 MED ORDER — SINCALIDE 5 MCG IJ SOLR
INTRAMUSCULAR | Status: AC
  Administered 2014-06-04: 2.32 ug via INTRAVENOUS
  Filled 2014-06-04: qty 5

## 2014-06-04 MED ORDER — SINCALIDE 5 MCG IJ SOLR: 0.0200 ug/kg | Freq: Once | INTRAMUSCULAR | Status: AC

## 2014-06-04 MED ORDER — STERILE WATER FOR INJECTION IJ SOLN
INTRAMUSCULAR | Status: AC
  Filled 2014-06-04: qty 10

## 2014-06-04 MED ORDER — SINCALIDE 5 MCG IJ SOLR
INTRAMUSCULAR | Status: AC
  Filled 2014-06-04: qty 5

## 2014-06-04 MED ORDER — TECHNETIUM TC 99M MEBROFENIN IV KIT
5.0000 | PACK | Freq: Once | INTRAVENOUS | Status: AC | PRN
  Administered 2014-06-04: 5 via INTRAVENOUS

## 2014-06-05 ENCOUNTER — Other Ambulatory Visit: Payer: Self-pay | Admitting: Family Medicine

## 2014-06-05 DIAGNOSIS — R1011 Right upper quadrant pain: Secondary | ICD-10-CM

## 2014-06-05 DIAGNOSIS — K760 Fatty (change of) liver, not elsewhere classified: Secondary | ICD-10-CM

## 2014-06-11 ENCOUNTER — Encounter: Payer: Self-pay | Admitting: Gastroenterology

## 2014-06-11 ENCOUNTER — Ambulatory Visit (INDEPENDENT_AMBULATORY_CARE_PROVIDER_SITE_OTHER): Payer: Medicare Other | Admitting: Gastroenterology

## 2014-06-11 VITALS — BP 122/52 | HR 80 | Ht 72.0 in | Wt 256.0 lb

## 2014-06-11 DIAGNOSIS — Z860100 Personal history of colon polyps, unspecified: Secondary | ICD-10-CM

## 2014-06-11 DIAGNOSIS — R10A1 Flank pain, right side: Secondary | ICD-10-CM

## 2014-06-11 DIAGNOSIS — R109 Unspecified abdominal pain: Secondary | ICD-10-CM

## 2014-06-11 DIAGNOSIS — Z8711 Personal history of peptic ulcer disease: Secondary | ICD-10-CM

## 2014-06-11 MED ORDER — NA SULFATE-K SULFATE-MG SULF 17.5-3.13-1.6 GM/177ML PO SOLN: ORAL | Status: DC

## 2014-06-11 NOTE — Patient Instructions (Signed)
You have been scheduled for an endoscopy and colonoscopy with Dr. [name]. Please follow the written instructions given to you at your visit today.Please pick up your prep at the pharmacy within the next 1-3 days. If you use inhalers (even only as needed), please bring them with you on the day of your procedure. Your physician has requested that you go to www.startemmi.com and enter the access code given to you at your visit today. This web site gives a general overview about your procedure. However, you should still follow specific instructions given to you by our office regarding your preparation for the procedure.

## 2014-06-11 NOTE — Progress Notes (Signed)
06/11/2014 DEMARRION Sweeney [id number] 05/16/1941   HISTORY OF PRESENT ILLNESS:  This is a pleasant 73 year old male who is previously known to Dr. Carlean Sweeney. The patient has a history of bleeding duodenal ulcer in December 2008. Most recent EGD in February 2012 showed duodenitis in the bulb and descending duodenum along with stricture in the bulb likely from previous ulcer disease. He also had mild gastritis. He had previously been found to have H. pylori, which was subsequently treated.  Last colonoscopy was in February 2009 at which time he was found to have diverticulosis and small internal hemorrhoids but no polyps at that time. Due to history of adenomatous polyps in 2006 it was recommended that he have repeat colonoscopy in 5 years from the 2009 colonoscopy. He would like to schedule along is here today.  Anyways, the patient presents for office today at the request of his PCP for evaluation regarding "right upper quadrant abdominal pain". When the patient describes his symptoms it appears that his pain is mostly on his right side/right flank. He states that the pain does come up into his right rib cage at times. He reports that he had not had any pain in the last 3 weeks, but prior to that he had 2 days of constant pain that even kept him from sleeping at night. Pain is not necessarily triggered by eating. The pain then resolves on its own without intervention. He had evaluation of his gallbladder with an ultrasound back in February when he was having pain at that time, however, that showed stable fatty infiltration of the liver and hepatic cyst but was otherwise unremarkable. Most recently, on August 24, he had a HIDA scan which showed patent cystic duct and common bile duct and normal gallbladder ejection fraction of 86%. He is not on any PPI therapy despite recommendation to continue pantoprazole daily at his last endoscopy. He says that he did take pantoprazole for a short time back in February  when he had this pain, but did not like the way to medication made him feel.  He has some mild constipation, which he contributes to poor dietary choices.  He can usually manage the constipation with dietary measures such as eating prunes, etc.     Past Medical History  Diagnosis Date  . CAD (coronary artery disease) 2004    s/p MI treated with drug-eluting stent placement to the proximal left anterior descending in late 2004  . Renal insufficiency   . Hypercholesteremia   . Urticaria     Lat left arm  . Anemia, iron deficiency   . Helicobacter pylori gastritis     recurrent  . Personal history of colonic polyps   . Acute duodenal ulcer with hemorrhage but without obstruction     required  bld. transfusion  . GERD (gastroesophageal reflux disease)   . Hyperglycemia   . Myocardial infarct   . Diverticulosis of colon   . Hypertension   . 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 H/O hiatal hernia   . 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 Arthritis     OA- pt. reports its. minor  . NAFLD (nonalcoholic fatty liver disease) 2015    with liver cysts   Past Surgical History  Procedure Laterality Date  . Shoulder surgery      right  . Wrist surgery    . Spinal  cyst removal    . Knee surgery      left  . Coronary stent placement  2006    drug-eluting  . Hernia repair Right 2006    Dr. Hassell Done  . Esophagogastroduodenoscopy  11/26/10    mild gastritis, duodenitis, duodenal stricture (Dr. Carlean Sweeney)  . Colonoscopy  11/2007    diverticulosis, int hem, h/o adenomatous polyps Luis Sweeney)  . Cardiac catheterization  2006    stent placed  . Anterior cervical decomp/discectomy fusion  03/2013    C3/4, C4/5 HNP (kritzer)  . Anterior cervical decomp/discectomy fusion N/A 03/15/2013    Procedure: ANTERIOR CERVICAL DECOMPRESSION/DISCECTOMY FUSION 2 LEVELS;  Surgeon: Faythe Ghee, MD;  Location: [facility];  Service: Neurosurgery;  Laterality:  N/A;  Cervical three-four, cervical four-five anterior cervical diskectomy fusion with a trabecular metal plus plate     reports that he quit smoking about 29 years ago. He has never used smokeless tobacco. He reports that he drinks alcohol. He reports that he does not use illicit drugs. family history includes Arthritis in his sister; Cancer in his sister; Diabetes in his father and sister; Heart disease in his father; Heart failure in his father; Hypertension in his sister and sister; Kidney disease in his father; Ovarian cancer in his other. Allergies  Allergen Reactions  . Codeine Hives, Itching and Other (See Comments)    Stomach cramps  . Morphine Hives and Itching  . Simvastatin Other (See Comments)    Muscle pain      Outpatient Encounter Prescriptions as of 06/11/2014  Medication Sig  . aspirin 81 MG tablet Take 81 mg by mouth daily.  . Cholecalciferol (VITAMIN D-3) 5000 UNITS TABS Take by mouth daily.   . Cyanocobalamin (VITAMIN B-12 PO) Take 1,000 mcg by mouth daily.   . pravastatin (PRAVACHOL) 80 MG tablet Take 1 tablet (80 mg total) by mouth daily.  . ramipril (ALTACE) 5 MG capsule Take 1 capsule (5 mg total) by mouth 2 (two) times daily.  . rosuvastatin (CRESTOR) 5 MG tablet Take 1 tablet (5 mg total) by mouth at bedtime.  . Na Sulfate-K Sulfate-Mg Sulf (SUPREP BOWEL PREP) SOLN USE PER PREP INSTRUCTIONS     REVIEW OF SYSTEMS  : All other systems reviewed and negative except where noted in the History of Present Illness.   PHYSICAL EXAM: BP 122/52  Pulse 80  Ht 6' (1.829 m)  Wt 256 lb (116.121 kg)  BMI 34.71 kg/m2 General: Well developed white male in no acute distress Head: Normocephalic and atraumatic Eyes:  Sclerae anicteric, conjunctiva pink. Ears: Normal auditory acuity  Lungs: Clear throughout to auscultation Heart: Regular rate and rhythm Abdomen: Soft, non-distended.  Normal bowel sounds.  Non-tender. Rectal:  Deferred.  Will be done at the time of  colonoscopy. Musculoskeletal: Symmetrical with no gross deformities  Skin: No lesions on visible extremities Extremities:  Trace pitting edema in LLE.  Right arm deformity and weakness/paralysis.  Psychological:  Alert and cooperative. Normal mood and affect  ASSESSMENT AND PLAN: -Personal history of colon polyps:  Will schedule colonoscopy for surveillance. -Right sided flank pain:  Currently patient does not have any pain, and I feel that the pain is likely musculoskeletal or at least non-GI in origin, however, due to his history of of PUD, he is asking if EGD can be performed as well, which I do not think is inappropriate.  Will schedule EGD as well.  He is not on PPI currently and would prefer to wait and see  if he "needs it" pending results of EGD.  The risks, benefits, and alternatives were discussed with the patient and he consents to proceed.

## 2014-06-12 NOTE — Progress Notes (Signed)
Agree with Ms. Zehr's management.  Kaedance Magos E. Felisia Balcom, MD, FACG  

## 2014-06-14 ENCOUNTER — Encounter: Payer: Self-pay | Admitting: Internal Medicine

## 2014-07-12 ENCOUNTER — Other Ambulatory Visit: Payer: Self-pay | Admitting: Cardiovascular Disease

## 2014-07-12 HISTORY — PX: ESOPHAGOGASTRODUODENOSCOPY: SHX1529

## 2014-07-12 HISTORY — PX: COLONOSCOPY: SHX174

## 2014-07-19 ENCOUNTER — Other Ambulatory Visit: Payer: Self-pay | Admitting: Cardiovascular Disease

## 2014-07-25 ENCOUNTER — Telehealth: Payer: Self-pay

## 2014-07-25 NOTE — Telephone Encounter (Signed)
Pt left note cholesterol med (Crestor) is too large to swallow and request substitute med for Crestor sent to [facility]. Please advise.

## 2014-07-26 ENCOUNTER — Encounter: Payer: Self-pay | Admitting: Internal Medicine

## 2014-07-26 ENCOUNTER — Ambulatory Visit (AMBULATORY_SURGERY_CENTER): Payer: Medicare Other | Admitting: Internal Medicine

## 2014-07-26 VITALS — BP 156/53 | HR 66 | Temp 97.7°F | Resp 26 | Ht 72.0 in | Wt 256.0 lb

## 2014-07-26 DIAGNOSIS — R1011 Right upper quadrant pain: Secondary | ICD-10-CM

## 2014-07-26 DIAGNOSIS — Z8711 Personal history of peptic ulcer disease: Secondary | ICD-10-CM

## 2014-07-26 DIAGNOSIS — K315 Obstruction of duodenum: Secondary | ICD-10-CM

## 2014-07-26 DIAGNOSIS — Z86010 Personal history of colonic polyps: Secondary | ICD-10-CM

## 2014-07-26 MED ORDER — PANTOPRAZOLE SODIUM 40 MG PO TBEC: 40.0000 mg | DELAYED_RELEASE_TABLET | Freq: Every day | ORAL | Status: DC

## 2014-07-26 MED ORDER — SODIUM CHLORIDE 0.9 % IV SOLN: 500.0000 mL | INTRAVENOUS | Status: DC

## 2014-07-26 NOTE — Patient Instructions (Addendum)
There is a narrow area in the duodenum and I think the ulcer is back. I have prescribed pantoprazole to treat it. Please see me in 2 months.  No colon polyps today. I do not think you need another routine colonoscopy.  I appreciate the opportunity to care for you. Gatha Mayer, MD, FACG YOU HAD AN ENDOSCOPIC PROCEDURE TODAY AT [facility]: Refer to the procedure report that was given to you for any specific questions about what was found during the examination.  If the procedure report does not answer your questions, please call your gastroenterologist to clarify.  If you requested that your care partner not be given the details of your procedure findings, then the procedure report has been included in a sealed envelope for you to review at your convenience later.  YOU SHOULD EXPECT: Some feelings of bloating in the abdomen. Passage of more gas than usual.  Walking can help get rid of the air that was put into your GI tract during the procedure and reduce the bloating. If you had a lower endoscopy (such as a colonoscopy or flexible sigmoidoscopy) you may notice spotting of blood in your stool or on the toilet paper. If you underwent a bowel prep for your procedure, then you may not have a normal bowel movement for a few days.  DIET: Your first meal following the procedure should be a light meal and then it is ok to progress to your normal diet.  A half-sandwich or bowl of soup is an example of a good first meal.  Heavy or fried foods are harder to digest and may make you feel nauseous or bloated.  Likewise meals heavy in dairy and vegetables can cause extra gas to form and this can also increase the bloating.  Drink plenty of fluids but you should avoid alcoholic beverages for 24 hours.  ACTIVITY: Your care partner should take you home directly after the procedure.  You should plan to take it easy, moving slowly for the rest of the day.  You can resume normal activity  the day after the procedure however you should NOT DRIVE or use heavy machinery for 24 hours (because of the sedation medicines used during the test).    SYMPTOMS TO REPORT IMMEDIATELY: A gastroenterologist can be reached at any hour.  During normal business hours, 8:30 AM to 5:00 PM Monday through Friday, call [phone].  After hours and on weekends, please call the GI answering service at [phone] who will take a message and have the physician on call contact you.   Following lower endoscopy (colonoscopy or flexible sigmoidoscopy):  Excessive amounts of blood in the stool  Significant tenderness or worsening of abdominal pains  Swelling of the abdomen that is new, acute  Fever of 100F or higher  Following upper endoscopy (EGD)  Vomiting of blood or coffee ground material  New chest pain or pain under the shoulder blades  Painful or persistently difficult swallowing  New shortness of breath  Fever of 100F or higher  Black, tarry-looking stools  FOLLOW UP: If any biopsies were taken you will be contacted by phone or by letter within the next 1-3 weeks.  Call your gastroenterologist if you have not heard about the biopsies in 3 weeks.  Our staff will call the home number listed on your records the next business day following your procedure to check on you and address any questions or concerns that you may have at that time  regarding the information given to you following your procedure. This is a courtesy call and so if there is no answer at the home number and we have not heard from you through the emergency physician on call, we will assume that you have returned to your regular daily activities without incident.  SIGNATURES/CONFIDENTIALITY: You and/or your care partner have signed paperwork which will be entered into your electronic medical record.  These signatures attest to the fact that that the information above on your After Visit Summary has been reviewed and is  understood.  Full responsibility of the confidentiality of this discharge information lies with you and/or your care-partner.

## 2014-07-26 NOTE — Progress Notes (Signed)
Report to PACU, RN, vss, BBS= Clear.  

## 2014-07-26 NOTE — Progress Notes (Signed)
No problems noted in the recovery room. maw 

## 2014-07-26 NOTE — Telephone Encounter (Signed)
Message left for patient to return my call.  

## 2014-07-26 NOTE — Telephone Encounter (Signed)
plz clarify with patient - does he mean crestor 5mg  (which was not covered by insurance) or newest med pravastatin 80mg ? Would he be willing to try lipitor again? If so may phone in atorvastatin 20mg  daily #30 RF 11

## 2014-07-26 NOTE — Op Note (Signed)
[place]  [address] [place], [id number]   ENDOSCOPY PROCEDURE REPORT  PATIENT: Luis, Sweeney  MR#: [id number] BIRTHDATE: 23-Nov-1940 , 73  yrs. old GENDER: male ENDOSCOPIST: Gatha Mayer, MD, [facility] PROCEDURE DATE:  07/26/2014 PROCEDURE:  EGD, diagnostic ASA CLASS:     Class III INDICATIONS:  abdominal pain in the upper right quadrant. MEDICATIONS: Propofol 150 mg IV and Monitored anesthesia care TOPICAL ANESTHETIC: none  DESCRIPTION OF PROCEDURE: After the risks benefits and alternatives of the procedure were thoroughly explained, informed consent was obtained.  The LB [id number] endoscope was introduced through the mouth and advanced to the second portion of the duodenum , Without limitations.  The instrument was slowly withdrawn as the mucosa was fully examined.    DUODENUM: There was a friable and benign appearing stricture in the bulb and 2nd part of the duodenum.  The stricture was not traversable.  Otherwise normal EGD  Retroflexed views revealed no abnormalities.     The scope was then withdrawn from the patient and the procedure completed.  COMPLICATIONS: There were no immediate complications.  ENDOSCOPIC IMPRESSION: 1.   There was a stricture in the bulb and 2nd part of the duodenum I suspect recurrent ulcer. 2.   Otherwise normal EGD  RECOMMENDATIONS: 1.  PPI qam 2.  See me in 2 months  REPEAT EXAM:  eSigned:  Gatha Mayer, MD, [facility] 07/26/2014 3:17 PM    CC:The Patient

## 2014-07-26 NOTE — Op Note (Signed)
[place]  [address] [place], [id number]   COLONOSCOPY PROCEDURE REPORT  PATIENT: Luis Sweeney, Luis Sweeney  MR#: [id number] BIRTHDATE: 1941/03/06 , 73  yrs. old GENDER: male ENDOSCOPIST: Gatha Mayer, MD, [facility] PROCEDURE DATE:  07/26/2014 PROCEDURE:   Colonoscopy, surveillance First Screening Colonoscopy - Avg.  risk and is 50 yrs.  old or older - No.  Prior Negative Screening - Now for repeat screening. N/A  History of Adenoma - Now for follow-up colonoscopy & has been > or = to 3 yrs.  Yes hx of adenoma.  Has been 3 or more years since last colonoscopy. ASA CLASS:   Class III INDICATIONS:high risk personal history of colonic polyps. MEDICATIONS: Residual sedation present, Propofol 150 mg IV, and Monitored anesthesia care  DESCRIPTION OF PROCEDURE:   After the risks benefits and alternatives of the procedure were thoroughly explained, informed consent was obtained.  The digital rectal exam revealed no abnormalities of the rectum, revealed no prostatic nodules, and revealed the prostate was not enlarged.   The LB [id number] endoscope was introduced through the anus and advanced to the cecum, which was identified by both the appendix and ileocecal valve. No adverse events experienced.   The quality of the prep was good.  The instrument was then slowly withdrawn as the colon was fully examined.      COLON FINDINGS: There was severe diverticulosis noted in the sigmoid colon.   The examination was otherwise normal.  Retroflexed views revealed no abnormalities. The time to cecum=3 minutes 18 seconds. Withdrawal time=6 minutes 10 seconds.  The scope was withdrawn and the procedure completed. COMPLICATIONS: There were no immediate complications.  ENDOSCOPIC IMPRESSION: 1.   Severe diverticulosis was noted in the sigmoid colon 2.   The examination was otherwise normal  RECOMMENDATIONS: Routine repeat colonoscopy not needed  eSigned:  Gatha Mayer, MD,  [facility] 07/26/2014 3:19 PM   cc: The Patient

## 2014-07-27 ENCOUNTER — Telehealth: Payer: Self-pay | Admitting: *Deleted

## 2014-07-27 MED ORDER — LOVASTATIN 40 MG PO TABS: 40.0000 mg | ORAL_TABLET | Freq: Every day | ORAL | Status: DC

## 2014-07-27 NOTE — Telephone Encounter (Signed)
May try lovastatin - but check size of pill before he fills it. 80mg  dose of pravastatin and lovastatin is equivalent to crestor 5mg . May try smaller lovastatin dose however and will recheck lipid levels. No other generics available. What was lipitor side effect?  Also, could we get started on a PA for crestor 5mg  daily - he has failed several other statins and needs high intensity therapy 2/2 h/o CAD with stent  Lab Results  Component Value Date   CHOL 113 05/25/2013   HDL 32.60* 05/25/2013   LDLCALC 63 05/25/2013   TRIG 88.0 05/25/2013   CHOLHDL 3 05/25/2013

## 2014-07-27 NOTE — Telephone Encounter (Signed)
Patient notified and verbalized understanding. He said he had muscles aches on the lipitor. He will check the size of the pill before leaving the pharmacy and call back if it is too big. Then I will start PA process.

## 2014-07-27 NOTE — Telephone Encounter (Signed)
Pt left v/m requesting cb [id number].

## 2014-07-27 NOTE — Telephone Encounter (Signed)
Spoke with patient. He said the pravastatin was too large and he did not want to try the lipitor. He wants something else in a generic.

## 2014-07-27 NOTE — Telephone Encounter (Signed)
  Follow up Call-  Call back number 07/26/2014  Post procedure Call Back phone  # [phone]  Permission to leave phone message Yes     Patient questions:  Do you have a fever, pain , or abdominal swelling? No. Pain Score  0 *  Have you tolerated food without any problems? Yes.    Have you been able to return to your normal activities? Yes.    Do you have any questions about your discharge instructions: Diet   No. Medications  No. Follow up visit  No.  Do you have questions or concerns about your Care? No.  Actions: * If pain score is 4 or above: No action needed, pain <4.

## 2014-08-07 ENCOUNTER — Encounter: Payer: Self-pay | Admitting: Family Medicine

## 2014-09-10 ENCOUNTER — Encounter: Payer: Self-pay | Admitting: Cardiovascular Disease

## 2014-09-10 ENCOUNTER — Ambulatory Visit (INDEPENDENT_AMBULATORY_CARE_PROVIDER_SITE_OTHER): Payer: Medicare Other | Admitting: Cardiovascular Disease

## 2014-09-10 VITALS — BP 122/62 | HR 69 | Ht 74.0 in | Wt 261.0 lb

## 2014-09-10 DIAGNOSIS — I2510 Atherosclerotic heart disease of native coronary artery without angina pectoris: Secondary | ICD-10-CM

## 2014-09-10 DIAGNOSIS — E7800 Pure hypercholesterolemia, unspecified: Secondary | ICD-10-CM

## 2014-09-10 NOTE — Patient Instructions (Addendum)
Your physician recommends that you return for a FASTING LIPID and LIVER profile--nothing to eat or drink after midnight, lab opens at 7:30 AM  Your physician wants you to follow-up in: 1 YEAR with Dr Cooper.  You will receive a reminder letter in the mail two months in advance. If you don't receive a letter, please call our office to schedule the follow-up appointment.  Your physician recommends that you continue on your current medications as directed. Please refer to the Current Medication list given to you today.  

## 2014-09-10 NOTE — Progress Notes (Signed)
Background: The patient is followed for coronary artery disease. The patient underwent drug-eluting stent placement in the LAD in 2004.  HPI:  73 year old gentleman presenting for follow-up evaluation. The patient reports no interval change in symptoms since I saw him one year ago. He has no chest pain, chest pressure, or shortness of breath associated with exertion. He denies edema, orthopnea, or PND. He reports no medication changes.   Outpatient Encounter Prescriptions as of 09/10/2014  Medication Sig  . aspirin 81 MG tablet Take 81 mg by mouth daily.  . Cholecalciferol (VITAMIN D-3) 5000 UNITS TABS Take by mouth daily.   . Cyanocobalamin (VITAMIN B-12 PO) Take 1,000 mcg by mouth daily.   Marland Kitchen lovastatin (MEVACOR) 40 MG tablet Take 1 tablet (40 mg total) by mouth at bedtime.  . pantoprazole (PROTONIX) 40 MG tablet Take 1 tablet (40 mg total) by mouth daily before breakfast.  . ramipril (ALTACE) 5 MG capsule TAKE 1 CAPSULE (5 MG TOTAL) BY MOUTH 2 (TWO) TIMES DAILY.  . [DISCONTINUED] rosuvastatin (CRESTOR) 5 MG tablet Take 1 tablet (5 mg total) by mouth at bedtime. (Patient not taking: Reported on 09/10/2014)    Allergies  Allergen Reactions  . Codeine Hives, Itching and Other (See Comments)    Stomach cramps  . Lipitor [Atorvastatin] Other (See Comments)    muscle aches  . Morphine Hives and Itching  . Simvastatin Other (See Comments)    Muscle pain    Past Medical History  Diagnosis Date  . CAD (coronary artery disease) 2004    s/p MI treated with drug-eluting stent placement to the proximal left anterior descending in late 2004  . Renal insufficiency   . Hypercholesteremia   . Urticaria     Lat left arm  . Anemia, iron deficiency   . Helicobacter pylori gastritis     recurrent  . Personal history of colonic polyps   . Acute duodenal ulcer with hemorrhage but without obstruction     required  bld. transfusion  . GERD (gastroesophageal reflux disease)   . Hyperglycemia     . Myocardial infarct   . Diverticulosis of colon   . Hypertension   . 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 H/O hiatal hernia   . Arthritis     OA- pt. reports its. minor  . NAFLD (nonalcoholic fatty liver disease) 2015    with liver cysts  . 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 + at birth     family history includes Arthritis in his sister; Cancer in his sister; Diabetes in his father and sister; Heart disease in his father; Heart failure in his father; Hypertension in his sister and sister; Kidney disease in his father; Ovarian cancer in his other.   ROS: Negative except as per HPI  BP 122/62 mmHg  Pulse 69  Ht 6\' 2"  (1.88 m)  Wt 261 lb (118.389 kg)  BMI 33.50 kg/m2  SpO2 98%  PHYSICAL EXAM: Pt is alert and oriented, NAD HEENT: normal Neck: JVP - normal, carotids 2+= without bruits Lungs: CTA bilaterally CV: RRR without murmur or gallop Abd: soft, NT, Positive BS, no hepatomegaly Ext: no C/C/E, distal pulses intact and equal, right arm and hand deformity is chronic Skin: warm/dry no rash  EKG:  Normal sinus rhythm 69 bpm, within normal limits.  ASSESSMENT AND PLAN: 1. Coronary artery disease, native vessel, without symptoms of angina: He remains on a combination  of aspirin, a statin drug, and an ACE inhibitor.  2. Hyperlipidemia: The patient is treated with lovastatin. We will arrange a follow-up lipid panel. Lipid Panel     Component Value Date/Time   CHOL 113 05/25/2013 1055   TRIG 88.0 05/25/2013 1055   HDL 32.60* 05/25/2013 1055   CHOLHDL 3 05/25/2013 1055   VLDL 17.6 05/25/2013 1055   LDLCALC 63 05/25/2013 1055   3. Essential hypertension: Blood pressure remains well-controlled on ramipril 5 mg daily.  Sherren Mocha, MD 09/10/2014 12:48 PM

## 2014-09-11 ENCOUNTER — Other Ambulatory Visit (INDEPENDENT_AMBULATORY_CARE_PROVIDER_SITE_OTHER): Payer: Medicare Other | Admitting: *Deleted

## 2014-09-11 DIAGNOSIS — I2510 Atherosclerotic heart disease of native coronary artery without angina pectoris: Secondary | ICD-10-CM

## 2014-09-11 DIAGNOSIS — E7800 Pure hypercholesterolemia, unspecified: Secondary | ICD-10-CM

## 2014-09-11 LAB — LIPID PANEL
Cholesterol: 126 mg/dL (ref 0–200)
HDL: 32.1 mg/dL — ABNORMAL LOW (ref >39.00)
LDL Cholesterol: 79 mg/dL (ref 0–99)
NonHDL: 93.9
Total CHOL/HDL Ratio: 4
Triglycerides: 74 mg/dL (ref 0.0–149.0)
VLDL: 14.8 mg/dL (ref 0.0–40.0)

## 2014-09-11 LAB — HEPATIC FUNCTION PANEL
ALT: 16 U/L (ref 0–53)
AST: 24 U/L (ref 0–37)
Albumin: 3.9 g/dL (ref 3.5–5.2)
Alkaline Phosphatase: 31 U/L — ABNORMAL LOW (ref 39–117)
Bilirubin, Direct: 0.1 mg/dL (ref 0.0–0.3)
Total Bilirubin: 0.5 mg/dL (ref 0.2–1.2)
Total Protein: 6.7 g/dL (ref 6.0–8.3)

## 2014-09-21 ENCOUNTER — Encounter: Payer: Self-pay | Admitting: Internal Medicine

## 2014-09-21 ENCOUNTER — Ambulatory Visit (INDEPENDENT_AMBULATORY_CARE_PROVIDER_SITE_OTHER): Payer: Medicare Other | Admitting: Internal Medicine

## 2014-09-21 VITALS — BP 122/58 | HR 84 | Ht 72.0 in | Wt 262.0 lb

## 2014-09-21 DIAGNOSIS — K315 Obstruction of duodenum: Secondary | ICD-10-CM

## 2014-09-21 HISTORY — DX: Obstruction of duodenum: K31.5

## 2014-09-21 NOTE — Progress Notes (Signed)
   Subjective:    Patient ID: BORNA WESSINGER, male    DOB: Feb 15, 1941, 73 y.o.   MRN: [id number]  HPI He returns after negative routine colonoscopy and EGD showing structure and suspected ulcer in duodenum (recurrent). He was having RUQ pain. Feels fine now on PPI qd. Medications, allergies, past medical history, past surgical history, family history and social history are reviewed and updated in the EMR.  Review of Systems As above    Objective:   Physical Exam WDWN NAD    Assessment & Plan:  Duodenal stricture   Continue PPI chronically to prevent stricture recurrence. See me as needed May get refills through PCP after he sees him again - happty to refill in interim.

## 2014-10-15 ENCOUNTER — Ambulatory Visit (INDEPENDENT_AMBULATORY_CARE_PROVIDER_SITE_OTHER): Payer: PPO | Admitting: Internal Medicine

## 2014-10-15 ENCOUNTER — Encounter: Payer: Self-pay | Admitting: Internal Medicine

## 2014-10-15 VITALS — BP 138/68 | HR 96 | Temp 98.2°F | Wt 262.0 lb

## 2014-10-15 DIAGNOSIS — J069 Acute upper respiratory infection, unspecified: Secondary | ICD-10-CM

## 2014-10-15 MED ORDER — AZITHROMYCIN 250 MG PO TABS: ORAL_TABLET | ORAL | Status: DC

## 2014-10-15 NOTE — Patient Instructions (Signed)
Cough, Adult  A cough is a reflex that helps clear your throat and airways. It can help heal the body or may be a reaction to an irritated airway. A cough may only last 2 or 3 weeks (acute) or may last more than 8 weeks (chronic).  CAUSES Acute cough:  Viral or bacterial infections. Chronic cough:  Infections.  Allergies.  Asthma.  Post-nasal drip.  Smoking.  Heartburn or acid reflux.  Some medicines.  Chronic lung problems (COPD).  Cancer. SYMPTOMS   Cough.  Fever.  Chest pain.  Increased breathing rate.  High-pitched whistling sound when breathing (wheezing).  Colored mucus that you cough up (sputum). TREATMENT   A bacterial cough may be treated with antibiotic medicine.  A viral cough must run its course and will not respond to antibiotics.  Your caregiver may recommend other treatments if you have a chronic cough. HOME CARE INSTRUCTIONS   Only take over-the-counter or prescription medicines for pain, discomfort, or fever as directed by your caregiver. Use cough suppressants only as directed by your caregiver.  Use a cold steam vaporizer or humidifier in your bedroom or home to help loosen secretions.  Sleep in a semi-upright position if your cough is worse at night.  Rest as needed.  Stop smoking if you smoke. SEEK IMMEDIATE MEDICAL CARE IF:   You have pus in your sputum.  Your cough starts to worsen.  You cannot control your cough with suppressants and are losing sleep.  You begin coughing up blood.  You have difficulty breathing.  You develop pain which is getting worse or is uncontrolled with medicine.  You have a fever. MAKE SURE YOU:   Understand these instructions.  Will watch your condition.  Will get help right away if you are not doing well or get worse. Document Released: 03/27/2011 Document Revised: 12/21/2011 Document Reviewed: 03/27/2011 Exit[employer] Patient Information 2015 [employer]. This information is not intended  to replace advice given to you by your health care provider. Make sure you discuss any questions you have with your health care provider.  

## 2014-10-15 NOTE — Progress Notes (Signed)
Pre visit review using our clinic review tool, if applicable. No additional management support is needed unless otherwise documented below in the visit note. 

## 2014-10-15 NOTE — Progress Notes (Signed)
HPI  Pt presents to the clinic today with c/o cough. He reports this started 4 days ago. The cough is productive of green mucous. He has had some associated headache, runny nose and sore throat. He has tried Mucinex and an antihistamine OTC without much relief. He has not had sick contacts that he is aware of. He has no history of allergies or breathing problems.  Review of Systems      Past Medical History  Diagnosis Date  . CAD (coronary artery disease) 2004    s/p MI treated with drug-eluting stent placement to the proximal left anterior descending in late 2004  . Renal insufficiency   . Hypercholesteremia   . Urticaria     Lat left arm  . Anemia, iron deficiency   . Helicobacter pylori gastritis     recurrent  . Personal history of colonic polyps   . Acute duodenal ulcer with hemorrhage but without obstruction     required  bld. transfusion  . GERD (gastroesophageal reflux disease)   . Hyperglycemia   . Myocardial infarct   . Diverticulosis of colon   . Hypertension   . 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 H/O hiatal hernia   . Arthritis     OA- pt. reports its. minor  . NAFLD (nonalcoholic fatty liver disease) 2015    with liver cysts  . 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 Duodenal stricture - recurrent 09/21/2014    Family History  Problem Relation Age of Onset  . Diabetes Father   . Heart disease Father     CHF  . Kidney disease Father     kidney failure  . Heart failure Father   . Diabetes Sister   . Hypertension Sister   . Cancer Sister     pelvic mass  . Hypertension Sister   . Arthritis Sister   . Ovarian cancer Other     History   Social History  . Marital Status: Married    Spouse Name: N/A    Number of Children: 3  . Years of Education: N/A   Occupational History  . [employer]     metal Fabrication/Stairs/Rails commerial   Social History Main Topics  . Smoking status: Former  Smoker -- 1.50 packs/day for 3 years    Quit date: 02/16/1985  . Smokeless tobacco: Never Used  . Alcohol Use: 0.0 oz/week    0 drink(s) per week     Comment: two to three times a year  . Drug Use: No  . Sexual Activity: No     Comment: last few years.   Other Topics Concern  . Not on file   Social History Narrative   Daily caffeine use 3 per day   Lives with wife    Activity: no regular exercise   Diet: tries to avoid sweets, some water, fruits/vegetables occasionally    Allergies  Allergen Reactions  . Codeine Hives, Itching and Other (See Comments)    Stomach cramps  . Lipitor [Atorvastatin] Other (See Comments)    muscle aches  . Morphine Hives and Itching  . Simvastatin Other (See Comments)    Muscle pain     Constitutional: Positive headache, fatigue. Denies fever or abrupt weight changes.  HEENT:  Positive runny nose, sore throat. Denies eye redness, eye pain, pressure behind the eyes, facial pain, nasal congestion, ear pain,  ringing in the ears, wax buildup, or bloody nose. Respiratory: Positive cough. Denies difficulty breathing or shortness of breath.  Cardiovascular: Denies chest pain, chest tightness, palpitations or swelling in the hands or feet.   No other specific complaints in a complete review of systems (except as listed in HPI above).  Objective:   BP 138/68 mmHg  Pulse 96  Temp(Src) 98.2 F (36.8 C) (Oral)  Wt 262 lb (118.842 kg)  SpO2 97%  Wt Readings from Last 3 Encounters:  10/15/14 262 lb (118.842 kg)  09/21/14 262 lb (118.842 kg)  09/10/14 261 lb (118.389 kg)     General: Appears his stated age, obese but well developed, well nourished in NAD. HEENT: Head: normal shape and size, no sinus tenderness noted; Eyes: sclera white, no icterus, conjunctiva pink; Ears: bilateral cerumen impaction; Nose: mucosa pink and moist, septum midline; Throat/Mouth: + PND. Teeth present, mucosa pink and moist, no exudate noted, no lesions or ulcerations  noted.  Neck:  No lymphadenopathy.  Cardiovascular: Normal rate and rhythm. S1,S2 noted.  No murmur, rubs or gallops noted. Pulmonary/Chest: Normal effort and positive vesicular breath sounds. No respiratory distress. No wheezes, rales or ronchi noted.      Assessment & Plan:   Upper Respiratory Infection with cough and congestion:  Get some rest and drink plenty of water Do salt water gargles/Ibuprofen for the sore throat Continue Mucinex eRx for Azithromax x 5 days Robitussin OTC for cough  RTC as needed or if symptoms persist.

## 2014-10-19 ENCOUNTER — Telehealth: Payer: Self-pay | Admitting: Internal Medicine

## 2014-10-19 NOTE — Telephone Encounter (Signed)
Called pt and he states he will try to tough it out over the weekend--pt was advised to go to [facility] if not feeling well and offered to check on Saturday clinic appts--pt declined

## 2014-10-19 NOTE — Telephone Encounter (Signed)
If he is still feeling bad, he needs to be reevaluated. If no appt available here, [facility]

## 2014-10-19 NOTE — Telephone Encounter (Signed)
Pt was seen Monday and is still feeling bad. Sxs: chest congestion, throat and ear issues. Pt request another abx called in.  [facility] [address] [address]  [phone]

## 2014-10-31 ENCOUNTER — Ambulatory Visit (INDEPENDENT_AMBULATORY_CARE_PROVIDER_SITE_OTHER): Payer: PPO | Admitting: Family Medicine

## 2014-10-31 ENCOUNTER — Encounter: Payer: Self-pay | Admitting: Family Medicine

## 2014-10-31 VITALS — BP 112/64 | HR 84 | Temp 98.5°F | Ht 74.0 in | Wt 263.0 lb

## 2014-10-31 DIAGNOSIS — N183 Chronic kidney disease, stage 3 unspecified: Secondary | ICD-10-CM

## 2014-10-31 DIAGNOSIS — K760 Fatty (change of) liver, not elsewhere classified: Secondary | ICD-10-CM

## 2014-10-31 DIAGNOSIS — R059 Cough, unspecified: Secondary | ICD-10-CM

## 2014-10-31 DIAGNOSIS — R05 Cough: Secondary | ICD-10-CM

## 2014-10-31 DIAGNOSIS — I252 Old myocardial infarction: Secondary | ICD-10-CM

## 2014-10-31 MED ORDER — AMOXICILLIN 500 MG PO CAPS: 1000.0000 mg | ORAL_CAPSULE | Freq: Two times a day (BID) | ORAL | Status: DC

## 2014-10-31 NOTE — Progress Notes (Signed)
Pre visit review using our clinic review tool, if applicable. No additional management support is needed unless otherwise documented below in the visit note. 

## 2014-10-31 NOTE — Progress Notes (Signed)
   Dr. Frederico Hamman T. Joelle Flessner, MD, [facility] [place], [address] Phone: [phone] Fax: [phone]  10/31/2014  Patient: Luis Sweeney, MRN: [id number], DOB: Oct 02, 1941, 74 y.o.  Primary Physician:  Ria Bush, MD  Chief Complaint: Cough and Nasal Congestion  Subjective:   Luis Sweeney is a 75 y.o. very pleasant male patient who presents with the following:  2 weeks sick, took a zpak, coughing and congestion with mouth full of blood.  He still coughing very bad, and he also has having purulent nasal drainage is also painful. He is taking 1 Zithromax course, without much significant improvement. He is currently afebrile.  Past Medical History, Surgical History, Social History, Family History, Problem List, Medications, and Allergies have been reviewed and updated if relevant.  ROS: GEN: Acute illness details above GI: Tolerating PO intake GU: maintaining adequate hydration and urination Pulm: No SOB Interactive and getting along well at home.  Otherwise, ROS is as per the HPI.   Objective:   BP 112/64 mmHg  Pulse 84  Temp(Src) 98.5 F (36.9 C) (Oral)  Ht 6\' 2"  (1.88 m)  Wt 263 lb (119.296 kg)  BMI 33.75 kg/m2  SpO2 95%   GEN: A and O x 3. WDWN. NAD.    ENT: Nose clear, ext NML.  No LAD.  No JVD.  TM's clear. Oropharynx clear.  PULM: Normal WOB, no distress. No crackles, wheezes, rhonchi. CV: RRR, no M/G/R, No rubs, No JVD.   EXT: warm and well-perfused, No c/c/e. PSYCH: Pleasant and conversant.    Laboratory and Imaging Data:  Assessment and Plan:   Cough  CKD (chronic kidney disease), stage III  MYOCARDIAL INFARCTION, HX OF  NAFLD (nonalcoholic fatty liver disease)  2 recent cough is productive in a setting where the patient has failed treatment with Zithromax. He did this is purely a viral syndrome, or in this case I am going to cover the gentleman with a slightly different coverage with  amoxicillin. Continue with supportive care.  Follow-up: No Follow-up on file.  New Prescriptions   AMOXICILLIN (AMOXIL) 500 MG CAPSULE    Take 2 capsules (1,000 mg total) by mouth 2 (two) times daily.   No orders of the defined types were placed in this encounter.    Signed,  Maud Deed. Drae Mitzel, MD   Patient's Medications  New Prescriptions   AMOXICILLIN (AMOXIL) 500 MG CAPSULE    Take 2 capsules (1,000 mg total) by mouth 2 (two) times daily.  Previous Medications   ASPIRIN 81 MG TABLET    Take 81 mg by mouth daily.   CHOLECALCIFEROL (VITAMIN D-3) 5000 UNITS TABS    Take by mouth daily.    CYANOCOBALAMIN (VITAMIN B-12 PO)    Take 1,000 mcg by mouth daily.    LOVASTATIN (MEVACOR) 40 MG TABLET    Take 1 tablet (40 mg total) by mouth at bedtime.   PANTOPRAZOLE (PROTONIX) 40 MG TABLET    Take 1 tablet (40 mg total) by mouth daily before breakfast.   RAMIPRIL (ALTACE) 5 MG CAPSULE    TAKE 1 CAPSULE (5 MG TOTAL) BY MOUTH 2 (TWO) TIMES DAILY.  Modified Medications   No medications on file  Discontinued Medications   AZITHROMYCIN (ZITHROMAX) 250 MG TABLET    Take 2 tabs today, then 1 tab daily x 4 days

## 2014-11-19 ENCOUNTER — Other Ambulatory Visit: Payer: Self-pay | Admitting: Family Medicine

## 2014-12-20 ENCOUNTER — Other Ambulatory Visit: Payer: Self-pay | Admitting: Family Medicine

## 2015-01-15 ENCOUNTER — Other Ambulatory Visit: Payer: Self-pay | Admitting: *Deleted

## 2015-01-15 ENCOUNTER — Other Ambulatory Visit: Payer: Self-pay | Admitting: Family Medicine

## 2015-01-15 DIAGNOSIS — K315 Obstruction of duodenum: Secondary | ICD-10-CM

## 2015-01-15 MED ORDER — LOVASTATIN 40 MG PO TABS: ORAL_TABLET | ORAL | Status: DC

## 2015-01-15 NOTE — Telephone Encounter (Signed)
Patient called stating that the refill on his Lovastatin was denied stating that he needed to schedule a physical for refills. Patient called and physical has been scheduled. See allergy/contraindiction. Is it okay to refill until appointment?

## 2015-01-15 NOTE — Telephone Encounter (Signed)
sent in

## 2015-02-14 ENCOUNTER — Other Ambulatory Visit: Payer: Self-pay | Admitting: Family Medicine

## 2015-02-14 DIAGNOSIS — K760 Fatty (change of) liver, not elsewhere classified: Secondary | ICD-10-CM

## 2015-02-14 DIAGNOSIS — N183 Chronic kidney disease, stage 3 unspecified: Secondary | ICD-10-CM

## 2015-02-14 DIAGNOSIS — E7800 Pure hypercholesterolemia, unspecified: Secondary | ICD-10-CM

## 2015-02-15 ENCOUNTER — Other Ambulatory Visit (INDEPENDENT_AMBULATORY_CARE_PROVIDER_SITE_OTHER): Payer: PPO

## 2015-02-15 DIAGNOSIS — E7800 Pure hypercholesterolemia, unspecified: Secondary | ICD-10-CM

## 2015-02-15 DIAGNOSIS — N183 Chronic kidney disease, stage 3 unspecified: Secondary | ICD-10-CM

## 2015-02-15 DIAGNOSIS — K760 Fatty (change of) liver, not elsewhere classified: Secondary | ICD-10-CM

## 2015-02-15 LAB — COMPREHENSIVE METABOLIC PANEL
ALT: 15 U/L (ref 0–53)
AST: 17 U/L (ref 0–37)
Albumin: 3.6 g/dL (ref 3.5–5.2)
Alkaline Phosphatase: 28 U/L — ABNORMAL LOW (ref 39–117)
BUN: 25 mg/dL — ABNORMAL HIGH (ref 6–23)
CO2: 29 mEq/L (ref 19–32)
Calcium: 9.3 mg/dL (ref 8.4–10.5)
Chloride: 103 mEq/L (ref 96–112)
Creatinine, Ser: 1.81 mg/dL — ABNORMAL HIGH (ref 0.40–1.50)
GFR: 39.12 mL/min — ABNORMAL LOW (ref >60.00)
Glucose, Bld: 109 mg/dL — ABNORMAL HIGH (ref 70–99)
Potassium: 4.5 mEq/L (ref 3.5–5.1)
Sodium: 137 mEq/L (ref 135–145)
Total Bilirubin: 0.5 mg/dL (ref 0.2–1.2)
Total Protein: 6.6 g/dL (ref 6.0–8.3)

## 2015-02-15 LAB — CBC WITH DIFFERENTIAL/PLATELET
Basophils Absolute: 0 10*3/uL (ref 0.0–0.1)
Basophils Relative: 0.5 % (ref 0.0–3.0)
Eosinophils Absolute: 0.1 10*3/uL (ref 0.0–0.7)
Eosinophils Relative: 1.6 % (ref 0.0–5.0)
HCT: 42 % (ref 39.0–52.0)
Hemoglobin: 14 g/dL (ref 13.0–17.0)
Lymphocytes Relative: 25.9 % (ref 12.0–46.0)
Lymphs Abs: 1.9 10*3/uL (ref 0.7–4.0)
MCHC: 33.3 g/dL (ref 30.0–36.0)
MCV: 90.8 fl (ref 78.0–100.0)
Monocytes Absolute: 0.6 10*3/uL (ref 0.1–1.0)
Monocytes Relative: 7.7 % (ref 3.0–12.0)
Neutro Abs: 4.7 10*3/uL (ref 1.4–7.7)
Neutrophils Relative %: 64.3 % (ref 43.0–77.0)
Platelets: 223 10*3/uL (ref 150.0–400.0)
RBC: 4.63 Mil/uL (ref 4.22–5.81)
RDW: 14.2 % (ref 11.5–15.5)
WBC: 7.4 10*3/uL (ref 4.0–10.5)

## 2015-02-15 LAB — LIPID PANEL
Cholesterol: 107 mg/dL (ref 0–200)
HDL: 33.7 mg/dL — ABNORMAL LOW (ref >39.00)
LDL Cholesterol: 58 mg/dL (ref 0–99)
NonHDL: 73.3
Total CHOL/HDL Ratio: 3
Triglycerides: 77 mg/dL (ref 0.0–149.0)
VLDL: 15.4 mg/dL (ref 0.0–40.0)

## 2015-02-20 ENCOUNTER — Encounter: Payer: Self-pay | Admitting: Family Medicine

## 2015-02-20 ENCOUNTER — Ambulatory Visit (INDEPENDENT_AMBULATORY_CARE_PROVIDER_SITE_OTHER): Payer: PPO | Admitting: Family Medicine

## 2015-02-20 VITALS — BP 128/62 | HR 56 | Temp 98.5°F | Ht 74.0 in | Wt 261.0 lb

## 2015-02-20 DIAGNOSIS — Z23 Encounter for immunization: Secondary | ICD-10-CM | POA: Diagnosis not present

## 2015-02-20 DIAGNOSIS — N183 Chronic kidney disease, stage 3 unspecified: Secondary | ICD-10-CM

## 2015-02-20 DIAGNOSIS — E7800 Pure hypercholesterolemia, unspecified: Secondary | ICD-10-CM

## 2015-02-20 DIAGNOSIS — I2510 Atherosclerotic heart disease of native coronary artery without angina pectoris: Secondary | ICD-10-CM | POA: Diagnosis not present

## 2015-02-20 DIAGNOSIS — Z0000 Encounter for general adult medical examination without abnormal findings: Secondary | ICD-10-CM | POA: Diagnosis not present

## 2015-02-20 DIAGNOSIS — Z7189 Other specified counseling: Secondary | ICD-10-CM | POA: Insufficient documentation

## 2015-02-20 DIAGNOSIS — K315 Obstruction of duodenum: Secondary | ICD-10-CM | POA: Diagnosis not present

## 2015-02-20 DIAGNOSIS — E66811 Obesity, class 1: Secondary | ICD-10-CM | POA: Insufficient documentation

## 2015-02-20 DIAGNOSIS — E669 Obesity, unspecified: Secondary | ICD-10-CM

## 2015-02-20 MED ORDER — LOVASTATIN 40 MG PO TABS: ORAL_TABLET | ORAL | Status: DC

## 2015-02-20 NOTE — Assessment & Plan Note (Signed)
Continue chronic PPI

## 2015-02-20 NOTE — Progress Notes (Signed)
Pre visit review using our clinic review tool, if applicable. No additional management support is needed unless otherwise documented below in the visit note. 

## 2015-02-20 NOTE — Assessment & Plan Note (Signed)
Discussed healthy diet and lifestyle changes to affect sustainable weight loss  

## 2015-02-20 NOTE — Assessment & Plan Note (Signed)
Reviewed in detail with patient. Discussed avoiding NSAIDs and increasing water intake. RTC 6 mo f/u labs.

## 2015-02-20 NOTE — Assessment & Plan Note (Signed)

## 2015-02-20 NOTE — Assessment & Plan Note (Signed)
Advanced directives: doesn't have set up. Would like packet.Wouldn't want prolonged life support.Would want oldest daughter Investment banker, corporate) to be HCPOA.

## 2015-02-20 NOTE — Patient Instructions (Addendum)
Prevnar today. Call your insurance about the shingles shot to see if it is covered or how much it would cost and where is cheaper (here or pharmacy).  If you want to receive here, call for nurse visit.  Advanced directive packet provided today. Return in 6 months for lab work. Return in 1 year for next wellness visit. Work on increased activity - get back to gym.  Good to see you today, call us with quesitons.

## 2015-02-20 NOTE — Assessment & Plan Note (Signed)
Reviewed with patient. Stable on lovastatin. Discussed increased aerobic exercise to help raise HDL. LDL at goal <70.

## 2015-02-20 NOTE — Addendum Note (Signed)
Addended by: Royann Shivers A on: 02/20/2015 10:48 AM   Modules accepted: Orders

## 2015-02-20 NOTE — Assessment & Plan Note (Signed)
Chronic, asxs. Continue aspirin and ACEI and statin.

## 2015-02-20 NOTE — Progress Notes (Signed)
BP 128/62 mmHg  Pulse 56  Temp(Src) 98.5 F (36.9 C) (Oral)  Ht 6\' 2"  (1.88 m)  Wt 261 lb (118.389 kg)  BMI 33.50 kg/m2   CC: medicare wellness visit  Subjective:    Patient ID: Luis Sweeney, male    DOB: 02/03/1941, 74 y.o.   MRN: [id number]  HPI: NAHOME Luis Sweeney is a 74 y.o. male presenting on 02/20/2015 for Annual Exam   H/o erb's palsy H/o anterior neck fusion 03/2013 ([facility]). Neck pain comes and goes. Some loss of high range since surgery.  Doing well with current meds.  H/o CAD presented with substernal chest pain. No recurrent pain.  CKD stage 3 - doesn't drink as much water as he should. HLD - compliant with lovastatin without myalgias. Wife with residual memory disturbance after stroke.   RUQ abd pain late last year - EGD showed duodenal stricture and ?ulcer, now well controlled on chronic daily PPI.  Passes hearing screen today. vision screen with eye doctor (06/2014) Denies falls/depression/anhedonia. No falls in last year.  Preventative: ESOPHAGOGASTRODUODENOSCOPY Date: 10//2015 duodenal stricture, ?recurrent ulcer [name]) COLONOSCOPY Date: 07/2014 severe diverticulosis, no rpt needed [name]) Prostate cancer screening - last checked 05/2013. No problems in past. Nocturia x1 per night. Strong stream. Requests final screen next year.  Flu not done this year. Td - 05/2012. Pneumovax 2009. prevnar today Discussed shingles shot - will think about this.  Advanced directives: doesn't have set up. Would like 2 packets.Wouldn't want prolonged life support.Would want oldest daughter [name] RN) to be [employer].   Daily caffeine use 3 per day  Lives with wife  Activity: no regular exercise  Diet: tries to avoid sweets, some water, fruits/vegetables occasionally   Relevant past medical, surgical, family and social history reviewed and updated as indicated. Interim medical history since our last visit reviewed. Allergies and medications reviewed and  updated. Current Outpatient Prescriptions on File Prior to Visit  Medication Sig  . aspirin 81 MG tablet Take 81 mg by mouth daily.  . Cholecalciferol (VITAMIN D-3) 5000 UNITS TABS Take by mouth daily.   . Cyanocobalamin (VITAMIN B-12 PO) Take 1,000 mcg by mouth daily.   . pantoprazole (PROTONIX) 40 MG tablet Take 1 tablet (40 mg total) by mouth daily before breakfast.  . ramipril (ALTACE) 5 MG capsule TAKE 1 CAPSULE (5 MG TOTAL) BY MOUTH 2 (TWO) TIMES DAILY.   No current facility-administered medications on file prior to visit.    Review of Systems Per HPI unless specifically indicated above     Objective:    BP 128/62 mmHg  Pulse 56  Temp(Src) 98.5 F (36.9 C) (Oral)  Ht 6\' 2"  (1.88 m)  Wt 261 lb (118.389 kg)  BMI 33.50 kg/m2  Wt Readings from Last 3 Encounters:  02/20/15 261 lb (118.389 kg)  10/31/14 263 lb (119.296 kg)  10/15/14 262 lb (118.842 kg)    Physical Exam  Constitutional: He is oriented to person, place, and time. He appears well-developed and well-nourished. No distress.  Obese (central)  HENT:  Head: Normocephalic and atraumatic.  Right Ear: Hearing, tympanic membrane, external ear and ear canal normal.  Left Ear: Hearing, tympanic membrane, external ear and ear canal normal.  Nose: Nose normal.  Mouth/Throat: Uvula is midline, oropharynx is clear and moist and mucous membranes are normal. No oropharyngeal exudate, posterior oropharyngeal edema or posterior oropharyngeal erythema.  Eyes: Conjunctivae and EOM are normal. Pupils are equal, round, and reactive to light. No scleral icterus.  Neck:  Normal range of motion. Neck supple. Carotid bruit is not present. No thyromegaly present.  Cardiovascular: Normal rate, regular rhythm, normal heart sounds and intact distal pulses.   No murmur heard. Pulses:      Radial pulses are 2+ on the right side, and 2+ on the left side.  Pulmonary/Chest: Effort normal and breath sounds normal. No respiratory distress. He has  no wheezes. He has no rales.  Abdominal: Soft. Bowel sounds are normal. He exhibits no distension and no mass. There is no tenderness. There is no rebound and no guarding.  Musculoskeletal: Normal range of motion. He exhibits no edema.  Lymphadenopathy:    He has no cervical adenopathy.  Neurological: He is alert and oriented to person, place, and time.  CN grossly intact, station and gait intact Recall 1/3 , 1/3 with cue Calculation 5/5 serial 7s   Skin: Skin is warm and dry. No rash noted.  Psychiatric: He has a normal mood and affect. His behavior is normal. Judgment and thought content normal.  Nursing note and vitals reviewed.  Results for orders placed or performed in visit on 02/15/15  Lipid panel  Result Value Ref Range   Cholesterol 107 0 - 200 mg/dL   Triglycerides 77.0 0.0 - 149.0 mg/dL   HDL 33.70 (L) >39.00 mg/dL   VLDL 15.4 0.0 - 40.0 mg/dL   LDL Cholesterol 58 0 - 99 mg/dL   Total CHOL/HDL Ratio 3    NonHDL 73.30   Comprehensive metabolic panel  Result Value Ref Range   Sodium 137 135 - 145 mEq/L   Potassium 4.5 3.5 - 5.1 mEq/L   Chloride 103 96 - 112 mEq/L   CO2 29 19 - 32 mEq/L   Glucose, Bld 109 (H) 70 - 99 mg/dL   BUN 25 (H) 6 - 23 mg/dL   Creatinine, Ser 1.81 (H) 0.40 - 1.50 mg/dL   Total Bilirubin 0.5 0.2 - 1.2 mg/dL   Alkaline Phosphatase 28 (L) 39 - 117 U/L   AST 17 0 - 37 U/L   ALT 15 0 - 53 U/L   Total Protein 6.6 6.0 - 8.3 g/dL   Albumin 3.6 3.5 - 5.2 g/dL   Calcium 9.3 8.4 - 10.5 mg/dL   GFR 39.12 (L) >60.00 mL/min  CBC with Differential/Platelet  Result Value Ref Range   WBC 7.4 4.0 - 10.5 K/uL   RBC 4.63 4.22 - 5.81 Mil/uL   Hemoglobin 14.0 13.0 - 17.0 g/dL   HCT 42.0 39.0 - 52.0 %   MCV 90.8 78.0 - 100.0 fl   MCHC 33.3 30.0 - 36.0 g/dL   RDW 14.2 11.5 - 15.5 %   Platelets 223.0 150.0 - 400.0 K/uL   Neutrophils Relative % 64.3 43.0 - 77.0 %   Lymphocytes Relative 25.9 12.0 - 46.0 %   Monocytes Relative 7.7 3.0 - 12.0 %   Eosinophils  Relative 1.6 0.0 - 5.0 %   Basophils Relative 0.5 0.0 - 3.0 %   Neutro Abs 4.7 1.4 - 7.7 K/uL   Lymphs Abs 1.9 0.7 - 4.0 K/uL   Monocytes Absolute 0.6 0.1 - 1.0 K/uL   Eosinophils Absolute 0.1 0.0 - 0.7 K/uL   Basophils Absolute 0.0 0.0 - 0.1 K/uL      Assessment & Plan:   Problem List Items Addressed This Visit    Obesity, Class I, BMI 30-34.9    Discussed healthy diet and lifestyle changes to affect sustainable weight loss      Medicare annual  wellness visit, subsequent - Primary    I have personally reviewed the Medicare Annual Wellness questionnaire and have noted 1. The patient's medical and social history 2. Their use of alcohol, tobacco or illicit drugs 3. Their current medications and supplements 4. The patient's functional ability including ADL's, fall risks, home safety risks and hearing or visual impairment. 5. Diet and physical activity 6. Evidence for depression or mood disorders The patients weight, height, BMI have been recorded in the chart.  Hearing and vision has been addressed. I have made referrals, counseling and provided education to the patient based review of the above and I have provided the pt with a written personalized care plan for preventive services. Provider list updated - see scanned questionairre. Reviewed preventative protocols and updated unless pt declined.       HYPERCHOLESTEROLEMIA    Reviewed with patient. Stable on lovastatin. Discussed increased aerobic exercise to help raise HDL. LDL at goal <70.      Relevant Medications   lovastatin (MEVACOR) 40 MG tablet   Duodenal stricture - recurrent    Continue chronic PPI       Coronary atherosclerosis of native coronary artery    Chronic, asxs. Continue aspirin and ACEI and statin.      Relevant Medications   lovastatin (MEVACOR) 40 MG tablet   CKD (chronic kidney disease), stage III    Reviewed in detail with patient. Discussed avoiding NSAIDs and increasing water intake. RTC 6 mo f/u  labs.      Advanced care planning/counseling discussion    Advanced directives: doesn't have set up. Would like packet.Wouldn't want prolonged life support.Would want oldest daughter Investment banker, corporate) to be HCPOA.           Follow up plan: Return in about 1 year (around 02/20/2016), or as needed, for medicare wellness visit.

## 2015-05-09 ENCOUNTER — Encounter: Payer: Self-pay | Admitting: Internal Medicine

## 2015-07-14 ENCOUNTER — Other Ambulatory Visit: Payer: Self-pay | Admitting: Internal Medicine

## 2015-08-23 ENCOUNTER — Other Ambulatory Visit (INDEPENDENT_AMBULATORY_CARE_PROVIDER_SITE_OTHER): Payer: PPO

## 2015-08-23 ENCOUNTER — Other Ambulatory Visit: Payer: Self-pay | Admitting: Family Medicine

## 2015-08-23 DIAGNOSIS — N183 Chronic kidney disease, stage 3 unspecified: Secondary | ICD-10-CM

## 2015-08-23 DIAGNOSIS — E7800 Pure hypercholesterolemia, unspecified: Secondary | ICD-10-CM | POA: Diagnosis not present

## 2015-08-23 DIAGNOSIS — Z125 Encounter for screening for malignant neoplasm of prostate: Secondary | ICD-10-CM

## 2015-08-23 DIAGNOSIS — E538 Deficiency of other specified B group vitamins: Secondary | ICD-10-CM

## 2015-08-23 LAB — RENAL FUNCTION PANEL
Albumin: 3.8 g/dL (ref 3.5–5.2)
BUN: 23 mg/dL (ref 6–23)
CO2: 28 mEq/L (ref 19–32)
Calcium: 9.1 mg/dL (ref 8.4–10.5)
Chloride: 103 mEq/L (ref 96–112)
Creatinine, Ser: 1.58 mg/dL — ABNORMAL HIGH (ref 0.40–1.50)
GFR: 45.69 mL/min — ABNORMAL LOW (ref >60.00)
Glucose, Bld: 110 mg/dL — ABNORMAL HIGH (ref 70–99)
Phosphorus: 2.8 mg/dL (ref 2.3–4.6)
Potassium: 4.3 mEq/L (ref 3.5–5.1)
Sodium: 137 mEq/L (ref 135–145)

## 2015-08-23 LAB — LIPID PANEL
Cholesterol: 109 mg/dL (ref 0–200)
HDL: 36.2 mg/dL — ABNORMAL LOW (ref >39.00)
LDL Cholesterol: 61 mg/dL (ref 0–99)
NonHDL: 72.43
Total CHOL/HDL Ratio: 3
Triglycerides: 56 mg/dL (ref 0.0–149.0)
VLDL: 11.2 mg/dL (ref 0.0–40.0)

## 2015-08-23 LAB — VITAMIN D 25 HYDROXY (VIT D DEFICIENCY, FRACTURES): VITD: 75.75 ng/mL (ref 30.00–100.00)

## 2015-08-23 LAB — VITAMIN B12: Vitamin B-12: 857 pg/mL (ref 211–911)

## 2015-08-26 LAB — PARATHYROID HORMONE, INTACT (NO CA): PTH: 46 pg/mL (ref 14–64)

## 2015-08-29 ENCOUNTER — Encounter: Payer: Self-pay | Admitting: *Deleted

## 2015-09-11 ENCOUNTER — Encounter: Payer: Self-pay | Admitting: Emergency Medicine

## 2015-09-11 ENCOUNTER — Emergency Department
Admission: EM | Admit: 2015-09-11 | Discharge: 2015-09-12 | Disposition: A | Discharge status: Home / Self Care | Payer: PPO | Attending: Student | Admitting: Student

## 2015-09-11 ENCOUNTER — Emergency Department: Payer: PPO

## 2015-09-11 DIAGNOSIS — N183 Chronic kidney disease, stage 3 (moderate): Secondary | ICD-10-CM | POA: Insufficient documentation

## 2015-09-11 DIAGNOSIS — I129 Hypertensive chronic kidney disease with stage 1 through stage 4 chronic kidney disease, or unspecified chronic kidney disease: Secondary | ICD-10-CM | POA: Diagnosis not present

## 2015-09-11 DIAGNOSIS — Z87891 Personal history of nicotine dependence: Secondary | ICD-10-CM | POA: Insufficient documentation

## 2015-09-11 DIAGNOSIS — R61 Generalized hyperhidrosis: Secondary | ICD-10-CM | POA: Diagnosis not present

## 2015-09-11 DIAGNOSIS — R079 Chest pain, unspecified: Secondary | ICD-10-CM | POA: Diagnosis not present

## 2015-09-11 DIAGNOSIS — E119 Type 2 diabetes mellitus without complications: Secondary | ICD-10-CM | POA: Diagnosis not present

## 2015-09-11 LAB — CBC
HCT: 43.1 % (ref 40.0–52.0)
Hemoglobin: 14.4 g/dL (ref 13.0–18.0)
MCH: 30.6 pg (ref 26.0–34.0)
MCHC: 33.4 g/dL (ref 32.0–36.0)
MCV: 91.4 fL (ref 80.0–100.0)
Platelets: 216 10*3/uL (ref 150–440)
RBC: 4.71 MIL/uL (ref 4.40–5.90)
RDW: 14.1 % (ref 11.5–14.5)
WBC: 7 10*3/uL (ref 3.8–10.6)

## 2015-09-11 LAB — COMPREHENSIVE METABOLIC PANEL
ALT: 18 U/L (ref 17–63)
AST: 25 U/L (ref 15–41)
Albumin: 3.9 g/dL (ref 3.5–5.0)
Alkaline Phosphatase: 31 U/L — ABNORMAL LOW (ref 38–126)
Anion gap: 7 (ref 5–15)
BUN: 25 mg/dL — ABNORMAL HIGH (ref 6–20)
CO2: 26 mmol/L (ref 22–32)
Calcium: 9 mg/dL (ref 8.9–10.3)
Chloride: 104 mmol/L (ref 101–111)
Creatinine, Ser: 1.89 mg/dL — ABNORMAL HIGH (ref 0.61–1.24)
GFR calc Af Amer: 39 mL/min — ABNORMAL LOW (ref >60)
GFR calc non Af Amer: 33 mL/min — ABNORMAL LOW (ref >60)
Glucose, Bld: 156 mg/dL — ABNORMAL HIGH (ref 65–99)
Potassium: 4.2 mmol/L (ref 3.5–5.1)
Sodium: 137 mmol/L (ref 135–145)
Total Bilirubin: 0.3 mg/dL (ref 0.3–1.2)
Total Protein: 7 g/dL (ref 6.5–8.1)

## 2015-09-11 LAB — TROPONIN I: Troponin I: <0.03 ng/mL (ref <0.031)

## 2015-09-11 MED ORDER — ASPIRIN 81 MG PO CHEW
324.0000 mg | CHEWABLE_TABLET | Freq: Once | ORAL | Status: AC
  Administered 2015-09-11: 324 mg via ORAL

## 2015-09-11 MED ORDER — ASPIRIN 81 MG PO CHEW
CHEWABLE_TABLET | ORAL | Status: AC
  Administered 2015-09-11: 324 mg via ORAL
  Filled 2015-09-11: qty 4

## 2015-09-11 NOTE — ED Notes (Signed)
MD at bedside. 

## 2015-09-11 NOTE — ED Notes (Signed)
Pt arrived to the ED accompanied by his wife for complaints of chest pain. Pt states that he was at [facility] practice when he suddenly felt hot and his chest began to hurt. Pt has a cardiac history and states that this episode felt just like his previous MI. Pt is AOx4 in no apparent distress.

## 2015-09-11 NOTE — ED Provider Notes (Signed)
[facility] Emergency Department Provider Note  ____________________________________________  Time seen: Approximately 22:00 PM  I have reviewed the triage vital signs and the nursing notes.   HISTORY  Chief Complaint Chest Pain    HPI Luis Sweeney is a 74 y.o. male with history of hypertension, hyperlipidemia, diabetes, coronary artery disease status post TE a stent to the LAD in 2004 followed at [facility] cardiology who presents for evaluation of now resolved gradual onset sternal/epigastric chest discomfort associated with a sensation of feeling warm all over and mild diaphoresis, now resolved. Patient reports that he was at choir practice at 7:30 PM when he developed this discomfort. It did not radiate, was not associated with shortness of breath, it concerned him because in some ways it felt somewhat similar to his prior heart attack which occurred several years ago. Pain was not ripping or tearing in nature and did not radiate to the back, shoulders, neck or down towards the feet. He reports that the pain lasted for approximately 30 minutes and then resolved completely without any intervention. He denies any recent illness including no cough, sneezing, runny nose, congestion, vomiting, diarrhea, fevers or chills. No history of PE or DVT. No modifying factors.  Past Medical History  Diagnosis Date  . CAD (coronary artery disease) 2004    s/p MI treated with drug-eluting stent placement to the proximal left anterior descending in late 2004  . Renal insufficiency   . Hypercholesteremia   . Urticaria     Lat left arm  . Anemia, iron deficiency   . Helicobacter pylori gastritis     recurrent  . Personal history of colonic polyps   . Acute duodenal ulcer with hemorrhage but without obstruction     required  bld. transfusion  . GERD (gastroesophageal reflux disease)   . Hyperglycemia   . Myocardial infarct ([place])   . Diverticulosis of colon   . Hypertension    . Paralysis of upper limb (HCC)     R arm- birth injury  . H/O hiatal hernia   . Arthritis     OA- pt. reports its. minor  . NAFLD (nonalcoholic fatty liver disease) 2015    with liver cysts  . 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 Duodenal stricture - recurrent 09/21/2014    Patient Active Problem List   Diagnosis Date Noted  . Advanced care planning/counseling discussion 02/20/2015  . Obesity, Class I, BMI 30-34.9 02/20/2015  . Duodenal stricture - recurrent 09/21/2014  . Hx of peptic ulcer 06/11/2014  . NAFLD (nonalcoholic fatty liver disease)   . Medicare annual wellness visit, subsequent 05/31/2012  . B12 deficiency 05/31/2012  . Right inguinal hernia 05/31/2012  . Coronary atherosclerosis of native coronary artery 03/13/2009  . CKD (chronic kidney disease), stage III 03/13/2009  . HYPERCHOLESTEROLEMIA 01/04/2008  . History of colonic polyps 11/07/2007  . PERSONAL HISTORY OF PEPTIC ULCER DISEASE 11/07/2007  . GERD 03/08/2007  . MYOCARDIAL INFARCTION, HX OF 03/07/2007  . ERB'S PALSY 03/07/2007  . HYPERGLYCEMIA 03/07/2007    Past Surgical History  Procedure Laterality Date  . Shoulder surgery      right  . Wrist surgery    . Spinal cyst removal    . Knee surgery      left  . Coronary stent placement  2006    drug-eluting  . Hernia repair Right 2006    Dr. Hassell Done  . Esophagogastroduodenoscopy  11/26/10    mild gastritis, duodenitis, duodenal stricture (Dr. [name])  . Colonoscopy  multiple    diverticulosis, int hem, h/o adenomatous polyps [name])  . Cardiac catheterization  2006    stent placed  . Anterior cervical decomp/discectomy fusion  03/2013    C3/4, C4/5 HNP (kritzer)  . Anterior cervical decomp/discectomy fusion N/A 03/15/2013    Procedure: ANTERIOR CERVICAL DECOMPRESSION/DISCECTOMY FUSION 2 LEVELS;  Surgeon: Faythe Ghee, MD;  Location: [facility]UR[facility]S;  Service: Neurosurgery;  Laterality: N/A;  Cervical three-four,  cervical four-five anterior cervical diskectomy fusion with a trabecular metal plus plate   . Esophagogastroduodenoscopy  10//2015    duodenal stricture, ?recurrent ulcer [name])  . Colonoscopy  07/2014    severe diverticulosis, no rpt needed [name])    Current Outpatient Rx  Name  Route  Sig  Dispense  Refill  . aspirin EC 81 MG tablet   Oral   Take 81 mg by mouth daily.         . Cholecalciferol (VITAMIN D3) 5000 UNITS TABS   Oral   Take 5,000 Units by mouth daily.         Marland Kitchen lovastatin (MEVACOR) 40 MG tablet   Oral   Take 40 mg by mouth at bedtime.         . pantoprazole (PROTONIX) 40 MG tablet   Oral   Take 40 mg by mouth daily.         . ramipril (ALTACE) 5 MG capsule   Oral   Take 5 mg by mouth 2 (two) times daily.         . vitamin B-12 (CYANOCOBALAMIN) 1000 MCG tablet   Oral   Take 1,000 mcg by mouth daily.           Allergies Codeine; Lipitor; Morphine; and Simvastatin  Family History  Problem Relation Age of Onset  . Diabetes Father   . Heart disease Father     CHF  . Kidney disease Father     kidney failure  . Heart failure Father   . Diabetes Sister   . Hypertension Sister   . Cancer Sister     pelvic mass  . Hypertension Sister   . Arthritis Sister   . Ovarian cancer Other     Social History Social History  Substance Use Topics  . Smoking status: Former Smoker -- 1.50 packs/day for 3 years    Quit date: 02/16/1985  . Smokeless tobacco: Never Used  . Alcohol Use: 0.0 oz/week    0 Standard drinks or equivalent per week     Comment: two to three times a year    Review of Systems Constitutional: No fever/chills Eyes: No visual changes. ENT: No sore throat. Cardiovascular: +chest pain. Respiratory: Denies shortness of breath. Gastrointestinal: No abdominal pain.  No nausea, no vomiting.  No diarrhea.  No constipation. Genitourinary: Negative for dysuria. Musculoskeletal: Negative for back pain. Skin: Negative for  rash. Neurological: Negative for headaches, focal weakness or numbness.  10-point ROS otherwise negative.  ____________________________________________   PHYSICAL EXAM:  VITAL SIGNS: ED Triage Vitals  Enc Vitals Group     BP 09/11/15 2002 141/58 mmHg     Pulse Rate 09/11/15 2002 80     Resp 09/11/15 2002 20     Temp 09/11/15 2002 98.1 F (36.7 C)     Temp Source 09/11/15 2002 Oral     SpO2 09/11/15 2002 95 %     Weight 09/11/15 2002 255 lb (115.667 kg)  Height 09/11/15 2002 6\' 2"  (1.88 m)     Head Cir --      Peak Flow --      Pain Score 09/11/15 2003 2     Pain Loc --      Pain Edu? --      Excl. in [place]? --     Constitutional: Alert and oriented. Well appearing and in no acute distress. He is sitting up in bed interactive, very pleasant and conversant. Eyes: Conjunctivae are normal. PERRL. EOMI. Head: Atraumatic. Nose: No congestion/rhinnorhea. Mouth/Throat: Mucous membranes are moist.  Oropharynx non-erythematous. Neck: No stridor.   Cardiovascular: Normal rate, regular rhythm. Grossly normal heart sounds.  Good peripheral circulation. Respiratory: Normal respiratory effort.  No retractions. Lungs CTAB. Gastrointestinal: Soft and nontender. No distention. No CVA tenderness. Genitourinary: deferred Musculoskeletal: No lower extremity tenderness nor edema.  No joint effusions. Neurologic:  Normal speech and language. No gross focal neurologic deficits are appreciated. No gait instability. Skin:  Skin is warm, dry and intact. No rash noted. Psychiatric: Mood and affect are normal. Speech and behavior are normal.  ____________________________________________   LABS (all labs ordered are listed, but only abnormal results are displayed)  Labs Reviewed  COMPREHENSIVE METABOLIC PANEL - Abnormal; Notable for the following:    Glucose, Bld 156 (*)    BUN 25 (*)    Creatinine, Ser 1.89 (*)    Alkaline Phosphatase 31 (*)    GFR calc non Af Amer 33 (*)    GFR calc Af  Amer 39 (*)    All other components within normal limits  CBC  TROPONIN I  TROPONIN I  URINALYSIS COMPLETEWITH MICROSCOPIC ([facility] ONLY)   ____________________________________________  EKG  ED ECG REPORT I, Joanne Gavel, the attending physician, personally viewed and interpreted this ECG.   Date: 09/12/2015  EKG Time: 20:13  Rate: 80  Rhythm: normal EKG, normal sinus rhythm, unchanged from previous tracings  Axis: normal  Intervals:none  ST&T Change: No acute ST elevation.  ____________________________________________  RADIOLOGY  CXR ____________________________________________   PROCEDURES  Procedure(s) performed: None  Critical Care performed: No  ____________________________________________   INITIAL IMPRESSION / ASSESSMENT AND PLAN / ED COURSE  Pertinent labs & imaging results that were available during my care of the patient were reviewed by me and considered in my medical decision making (see chart for details).  Luis Sweeney is a 74 y.o. male with history of hypertension, hyperlipidemia, diabetes, coronary artery disease status post TE a stent to the LAD in 2004 followed at [facility] cardiology who presents for evaluation of now resolved gradual onset sternal/epigastric chest discomfort. Currently his symptoms have resolved and he reports he feels well. On exam, vital signs stable, he is afebrile. He has a benign examination. EKG is normal without any evidence of acute ischemia, no Q waves to suggest chronic/old ischemia. Plan for screening labs, chest x-ray, reassess for disposition.  ----------------------------------------- 12:21 AM on 09/12/2015 ----------------------------------------- Labs reviewed. CMP notable for mild BUN and creatinine elevations which are chronic and at baseline on chart review. 2 sets of troponins are negative, making ACS unlikely as the cause of his pain. He has had no recurrence of chest pain since arrival to the emergency  department. Chest x-ray clear. Aspirin was given. He is requesting discharge and is not amenable to staying in the hospital tonight due to multiple family response ability/obligations, including an ailing wife for which he is the primary caregiver. Therefore, I have spoken with Dr. Stanford Breed of [facility], on-call for the patient's cardiologist Dr. Burt Knack, and he will help to arrange expedient outpatient follow-up over the next 1-2 days. Clinical picture is not consistent with PE or acute aortic dissection given no shortness of breath, no hypoxia, pain not ripping or tearing in nature and all symptoms resolved since even before my initial assessment. Discussed return precautions and need for close follow-up with the patient as well as his daughter at bedside who is also a nurse and all are comfortable with the discharge plan. DC home.  ____________________________________________   FINAL CLINICAL IMPRESSION(S) / ED DIAGNOSES  Final diagnoses:  Chest pain, unspecified chest pain type      Joanne Gavel, MD 09/12/15 [phone]

## 2015-09-11 NOTE — ED Notes (Signed)
Patient transported to X-ray 

## 2015-09-11 NOTE — ED Notes (Signed)
Patient returned from radiology

## 2015-09-12 LAB — TROPONIN I: Troponin I: <0.03 ng/mL (ref <0.031)

## 2015-09-16 ENCOUNTER — Encounter: Payer: Self-pay | Admitting: Cardiovascular Disease

## 2015-09-16 NOTE — Progress Notes (Signed)
The patient is seen today in the office with his wife. He has an upcoming office visit later this week, but requests to cancel. I reviewed his recent lab work and EKG. He appears stable from a cardiac perspective and I will plan to see him next year about this time.  Sherren Mocha 09/16/2015 1:21 PM

## 2015-09-19 ENCOUNTER — Ambulatory Visit: Payer: PPO | Admitting: Cardiovascular Disease

## 2015-10-14 ENCOUNTER — Other Ambulatory Visit: Payer: Self-pay | Admitting: Cardiovascular Disease

## 2015-10-18 ENCOUNTER — Other Ambulatory Visit: Payer: Self-pay | Admitting: Internal Medicine

## 2015-12-11 ENCOUNTER — Ambulatory Visit: Payer: PPO | Admitting: Cardiovascular Disease

## 2016-01-02 ENCOUNTER — Encounter: Payer: Self-pay | Admitting: Family Medicine

## 2016-01-02 ENCOUNTER — Ambulatory Visit (INDEPENDENT_AMBULATORY_CARE_PROVIDER_SITE_OTHER): Payer: PPO | Admitting: Family Medicine

## 2016-01-02 VITALS — BP 134/78 | HR 84 | Temp 98.0°F | Wt 263.0 lb

## 2016-01-02 DIAGNOSIS — H9202 Otalgia, left ear: Secondary | ICD-10-CM | POA: Diagnosis not present

## 2016-01-02 DIAGNOSIS — H9209 Otalgia, unspecified ear: Secondary | ICD-10-CM | POA: Insufficient documentation

## 2016-01-02 NOTE — Progress Notes (Signed)
Pre visit review using our clinic review tool, if applicable. No additional management support is needed unless otherwise documented below in the visit note. 

## 2016-01-02 NOTE — Progress Notes (Signed)
Subjective:   Patient ID: Luis Sweeney, male    DOB: 1941/06/24, 75 y.o.   MRN: [id number]  Luis Sweeney is a pleasant 75 y.o. year old male pt of Dr. Darnell Level, who presents to clinic today with Cyst  on 01/02/2016  HPI:  Luis Sweeney, felt some tenderness behind his left ear where his glasses rest. He felt and thought the "hard bump behind his left ear" was larger than left.  Pain has resolved today.  No known injury.  Never had anything like this before.  Current Outpatient Prescriptions on File Prior to Visit  Medication Sig Dispense Refill  . aspirin EC 81 MG tablet Take 81 mg by mouth daily.    . Cholecalciferol (VITAMIN D3) 5000 UNITS TABS Take 5,000 Units by mouth daily.    Marland Kitchen lovastatin (MEVACOR) 40 MG tablet Take 40 mg by mouth at bedtime.    . pantoprazole (PROTONIX) 40 MG tablet TAKE 1 TABLET BY MOUTH DAILY BEFORE BREAKFAST 90 tablet 0  . ramipril (ALTACE) 5 MG capsule TAKE 1 CAPSULE (5 MG TOTAL) BY MOUTH 2 (TWO) TIMES DAILY. 180 capsule 3  . vitamin B-12 (CYANOCOBALAMIN) 1000 MCG tablet Take 1,000 mcg by mouth daily.     No current facility-administered medications on file prior to visit.    Allergies  Allergen Reactions  . Codeine Hives and Itching  . Lipitor [Atorvastatin] Other (See Comments)    Reaction:  Muscle pain   . Morphine Hives and Itching  . Simvastatin Other (See Comments)    Reaction:  Muscle pain     Past Medical History  Diagnosis Date  . CAD (coronary artery disease) 2004    s/p MI treated with drug-eluting stent placement to the proximal left anterior descending in late 2004  . Renal insufficiency   . Hypercholesteremia   . Urticaria     Lat left arm  . Anemia, iron deficiency   . Helicobacter pylori gastritis     recurrent  . Personal history of colonic polyps   . Acute duodenal ulcer with hemorrhage but without obstruction     required  bld. transfusion  . GERD (gastroesophageal reflux disease)   . Hyperglycemia   . Myocardial infarct  ([place])   . Diverticulosis of colon   . Hypertension   . Paralysis of upper limb (HCC)     R arm- birth injury  . H/O hiatal hernia   . Arthritis     OA- pt. reports its. minor  . NAFLD (nonalcoholic fatty liver disease) 2015    with liver cysts  . 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 Duodenal stricture - recurrent 09/21/2014    Past Surgical History  Procedure Laterality Date  . Shoulder surgery      right  . Wrist surgery    . Spinal cyst removal    . Knee surgery      left  . Coronary stent placement  2006    drug-eluting  . Hernia repair Right 2006    Dr. Hassell Done  . Esophagogastroduodenoscopy  11/26/10    mild gastritis, duodenitis, duodenal stricture (Dr. [name])  . Colonoscopy  multiple    diverticulosis, int hem, h/o adenomatous polyps [name])  . Cardiac catheterization  2006    stent placed  . Anterior cervical decomp/discectomy fusion  03/2013    C3/4, C4/5 HNP (kritzer)  . Anterior cervical decomp/discectomy fusion N/A 03/15/2013    Procedure: ANTERIOR  CERVICAL DECOMPRESSION/DISCECTOMY FUSION 2 LEVELS;  Surgeon: Faythe Ghee, MD;  Location: [facility]UR[facility] ORS;  Service: Neurosurgery;  Laterality: N/A;  Cervical three-four, cervical four-five anterior cervical diskectomy fusion with a trabecular metal plus plate   . Esophagogastroduodenoscopy  10//2015    duodenal stricture, ?recurrent ulcer [name])  . Colonoscopy  07/2014    severe diverticulosis, no rpt needed [name])    Family History  Problem Relation Age of Onset  . Diabetes Father   . Heart disease Father     CHF  . Kidney disease Father     kidney failure  . Heart failure Father   . Diabetes Sister   . Hypertension Sister   . Cancer Sister     pelvic mass  . Hypertension Sister   . Arthritis Sister   . Ovarian cancer Other     Social History   Social History  . Marital Status: Married    Spouse Name: N/A  . Number of Children: 3  . Years of Education:  N/A   Occupational History  . 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 Smoking status: Former Smoker -- 1.50 packs/day for 3 years    Quit date: 02/16/1985  . Smokeless tobacco: Never Used  . Alcohol Use: 0.0 oz/week    0 Standard drinks or equivalent per week     Comment: two to three times a year  . Drug Use: No  . Sexual Activity: No     Comment: last few years.   Other Topics Concern  . Not on file   Social History Narrative   Daily caffeine use 3 per day   Lives with wife    Activity: no regular exercise   Diet: tries to avoid sweets, some water, fruits/vegetables occasionally   The PMH, PSH, Social History, Family History, Medications, and allergies have been reviewed in [facility], and have been updated if relevant.   Review of Systems  Constitutional: Negative.   HENT: Positive for ear pain. Negative for congestion, facial swelling and hearing loss.   All other systems reviewed and are negative.      Objective:    BP 134/78 mmHg  Pulse 84  Temp(Src) 98 F (36.7 C) (Oral)  Wt 263 lb (119.296 kg)  SpO2 96%   Physical Exam  Constitutional: He is oriented to person, place, and time. He appears well-developed and well-nourished. No distress.  HENT:  Head: Normocephalic.  NO masses, tenderness or warmth behind either ear.   Eyes: Conjunctivae are normal.  Cardiovascular: Normal rate.   Pulmonary/Chest: Effort normal.  Neurological: He is alert and oriented to person, place, and time. No cranial nerve deficit.  Skin: Skin is warm and dry. He is not diaphoretic.  Psychiatric: He has a normal mood and affect. His behavior is normal. Judgment and thought content normal.  Nursing note and vitals reviewed.         Assessment & Plan:   Pain behind the ear, left No Follow-up on file.

## 2016-01-02 NOTE — Assessment & Plan Note (Signed)
New- resolved. Reassurance provided. ? Prominent mastoid process- equal bilaterally, perhaps he fell asleep with his glasses on which caused irritation that has since resolved. Call or return to clinic prn if these symptoms worsen or fail to improve as anticipated. The patient indicates understanding of these issues and agrees with the plan.

## 2016-01-12 ENCOUNTER — Other Ambulatory Visit: Payer: Self-pay | Admitting: Internal Medicine

## 2016-01-17 DIAGNOSIS — Z6833 Body mass index (BMI) 33.0-33.9, adult: Secondary | ICD-10-CM | POA: Diagnosis not present

## 2016-01-17 DIAGNOSIS — M542 Cervicalgia: Secondary | ICD-10-CM | POA: Insufficient documentation

## 2016-02-06 DIAGNOSIS — M542 Cervicalgia: Secondary | ICD-10-CM | POA: Diagnosis not present

## 2016-02-16 ENCOUNTER — Other Ambulatory Visit: Payer: Self-pay | Admitting: Family Medicine

## 2016-02-16 DIAGNOSIS — R739 Hyperglycemia, unspecified: Secondary | ICD-10-CM

## 2016-02-16 DIAGNOSIS — E7800 Pure hypercholesterolemia, unspecified: Secondary | ICD-10-CM

## 2016-02-16 DIAGNOSIS — N183 Chronic kidney disease, stage 3 unspecified: Secondary | ICD-10-CM

## 2016-02-16 DIAGNOSIS — K760 Fatty (change of) liver, not elsewhere classified: Secondary | ICD-10-CM

## 2016-02-16 DIAGNOSIS — Z125 Encounter for screening for malignant neoplasm of prostate: Secondary | ICD-10-CM

## 2016-02-17 ENCOUNTER — Other Ambulatory Visit: Payer: Self-pay | Admitting: Internal Medicine

## 2016-02-20 ENCOUNTER — Ambulatory Visit (INDEPENDENT_AMBULATORY_CARE_PROVIDER_SITE_OTHER): Payer: PPO

## 2016-02-20 ENCOUNTER — Other Ambulatory Visit (INDEPENDENT_AMBULATORY_CARE_PROVIDER_SITE_OTHER): Payer: PPO

## 2016-02-20 VITALS — BP 118/72 | HR 72 | Temp 97.4°F | Ht 72.5 in | Wt 259.0 lb

## 2016-02-20 DIAGNOSIS — K760 Fatty (change of) liver, not elsewhere classified: Secondary | ICD-10-CM | POA: Diagnosis not present

## 2016-02-20 DIAGNOSIS — Z125 Encounter for screening for malignant neoplasm of prostate: Secondary | ICD-10-CM | POA: Diagnosis not present

## 2016-02-20 DIAGNOSIS — N183 Chronic kidney disease, stage 3 unspecified: Secondary | ICD-10-CM

## 2016-02-20 DIAGNOSIS — Z0000 Encounter for general adult medical examination without abnormal findings: Secondary | ICD-10-CM | POA: Diagnosis not present

## 2016-02-20 DIAGNOSIS — R739 Hyperglycemia, unspecified: Secondary | ICD-10-CM

## 2016-02-20 DIAGNOSIS — E7800 Pure hypercholesterolemia, unspecified: Secondary | ICD-10-CM | POA: Diagnosis not present

## 2016-02-20 LAB — LIPID PANEL
Cholesterol: 124 mg/dL (ref 0–200)
HDL: 28.2 mg/dL — ABNORMAL LOW (ref >39.00)
LDL Cholesterol: 78 mg/dL (ref 0–99)
NonHDL: 95.54
Total CHOL/HDL Ratio: 4
Triglycerides: 89 mg/dL (ref 0.0–149.0)
VLDL: 17.8 mg/dL (ref 0.0–40.0)

## 2016-02-20 LAB — COMPREHENSIVE METABOLIC PANEL
ALT: 14 U/L (ref 0–53)
AST: 17 U/L (ref 0–37)
Albumin: 4 g/dL (ref 3.5–5.2)
Alkaline Phosphatase: 28 U/L — ABNORMAL LOW (ref 39–117)
BUN: 26 mg/dL — ABNORMAL HIGH (ref 6–23)
CO2: 28 mEq/L (ref 19–32)
Calcium: 9 mg/dL (ref 8.4–10.5)
Chloride: 103 mEq/L (ref 96–112)
Creatinine, Ser: 1.87 mg/dL — ABNORMAL HIGH (ref 0.40–1.50)
GFR: 37.57 mL/min — ABNORMAL LOW (ref >60.00)
Glucose, Bld: 110 mg/dL — ABNORMAL HIGH (ref 70–99)
Potassium: 4.6 mEq/L (ref 3.5–5.1)
Sodium: 138 mEq/L (ref 135–145)
Total Bilirubin: 0.4 mg/dL (ref 0.2–1.2)
Total Protein: 6.2 g/dL (ref 6.0–8.3)

## 2016-02-20 LAB — PSA, MEDICARE: PSA: 1.16 ng/ml (ref 0.10–4.00)

## 2016-02-20 LAB — CBC WITH DIFFERENTIAL/PLATELET
Basophils Absolute: 0 10*3/uL (ref 0.0–0.1)
Basophils Relative: 0.2 % (ref 0.0–3.0)
Eosinophils Absolute: 0.1 10*3/uL (ref 0.0–0.7)
Eosinophils Relative: 1.6 % (ref 0.0–5.0)
HCT: 42.3 % (ref 39.0–52.0)
Hemoglobin: 13.9 g/dL (ref 13.0–17.0)
Lymphocytes Relative: 30.6 % (ref 12.0–46.0)
Lymphs Abs: 1.8 10*3/uL (ref 0.7–4.0)
MCHC: 32.8 g/dL (ref 30.0–36.0)
MCV: 91.8 fl (ref 78.0–100.0)
Monocytes Absolute: 0.6 10*3/uL (ref 0.1–1.0)
Monocytes Relative: 9.4 % (ref 3.0–12.0)
Neutro Abs: 3.5 10*3/uL (ref 1.4–7.7)
Neutrophils Relative %: 58.2 % (ref 43.0–77.0)
Platelets: 215 10*3/uL (ref 150.0–400.0)
RBC: 4.61 Mil/uL (ref 4.22–5.81)
RDW: 14.1 % (ref 11.5–15.5)
WBC: 6 10*3/uL (ref 4.0–10.5)

## 2016-02-20 LAB — VITAMIN D 25 HYDROXY (VIT D DEFICIENCY, FRACTURES): VITD: 76.18 ng/mL (ref 30.00–100.00)

## 2016-02-20 LAB — MICROALBUMIN / CREATININE URINE RATIO
Creatinine,U: 152.4 mg/dL
Microalb Creat Ratio: 0.5 mg/g (ref 0.0–30.0)
Microalb, Ur: <0.7 mg/dL (ref 0.0–1.9)

## 2016-02-20 LAB — HEMOGLOBIN A1C: Hgb A1c MFr Bld: 6.8 % — ABNORMAL HIGH (ref 4.6–6.5)

## 2016-02-20 NOTE — Patient Instructions (Signed)
Luis Sweeney , Thank you for taking time to come for your Medicare Wellness Visit. I appreciate your ongoing commitment to your health goals. Please review the following plan we discussed and let me know if I can assist you in the future.   These are the goals we discussed: Goals    . Increase physical activity     Starting 02/20/2016, I will attempt to do at least 5-15 minutes of chair exercises and walk on my treadmill as time permits.        This is a list of the screening recommended for you and due dates:  Health Maintenance  Topic Date Due  . Shingles Vaccine  02/19/2017*  . Flu Shot  05/12/2016  . Tetanus Vaccine  05/31/2022  . Colon Cancer Screening  07/26/2024  . DTaP/Tdap/Td vaccine  Completed  . Pneumonia vaccines  Completed  *Topic was postponed. The date shown is not the original due date.    Preventive Care for Adults  A healthy lifestyle and preventive care can promote health and wellness. Preventive health guidelines for adults include the following key practices.  . A routine yearly physical is a good way to check with your health care provider about your health and preventive screening. It is a chance to share any concerns and updates on your health and to receive a thorough exam.  . Visit your dentist for a routine exam and preventive care every 6 months. Brush your teeth twice a day and floss once a day. Good oral hygiene prevents tooth decay and gum disease.  . The frequency of eye exams is based on your age, health, family medical history, use  of contact lenses, and other factors. Follow your health care provider's ecommendations for frequency of eye exams.  . Eat a healthy diet. Foods like vegetables, fruits, whole grains, low-fat dairy products, and lean protein foods contain the nutrients you need without too many calories. Decrease your intake of foods high in solid fats, added sugars, and salt. Eat the right amount of calories for you. Get information about a  proper diet from your health care provider, if necessary.  . Regular physical exercise is one of the most important things you can do for your health. Most adults should get at least 150 minutes of moderate-intensity exercise (any activity that increases your heart rate and causes you to sweat) each week. In addition, most adults need muscle-strengthening exercises on 2 or more days a week.  Silver Sneakers may be a benefit available to you. To determine eligibility, you may visit the website: www.silversneakers.com or contact program at (509)752-5528 Mon-Fri between 8AM-8PM.   . Maintain a healthy weight. The body mass index (BMI) is a screening tool to identify possible weight problems. It provides an estimate of body fat based on height and weight. Your health care provider can find your BMI and can help you achieve or maintain a healthy weight.   For adults 20 years and older: ? A BMI below 18.5 is considered underweight. ? A BMI of 18.5 to 24.9 is normal. ? A BMI of 25 to 29.9 is considered overweight. ? A BMI of 30 and above is considered obese.   . Maintain normal blood lipids and cholesterol levels by exercising and minimizing your intake of saturated fat. Eat a balanced diet with plenty of fruit and vegetables. Blood tests for lipids and cholesterol should begin at age 81 and be repeated every 5 years. If your lipid or cholesterol levels are high,  you are over 50, or you are at high risk for heart disease, you may need your cholesterol levels checked more frequently. Ongoing high lipid and cholesterol levels should be treated with medicines if diet and exercise are not working.  . If you smoke, find out from your health care provider how to quit. If you do not use tobacco, please do not start.  . If you choose to drink alcohol, please do not consume more than 2 drinks per day. One drink is considered to be 12 ounces (355 mL) of beer, 5 ounces (148 mL) of wine, or 1.5 ounces (44 mL) of  liquor.  . If you are 82-60 years old, ask your health care provider if you should take aspirin to prevent strokes.  . Use sunscreen. Apply sunscreen liberally and repeatedly throughout the day. You should seek shade when your shadow is shorter than you. Protect yourself by wearing long sleeves, pants, a wide-brimmed hat, and sunglasses year round, whenever you are outdoors.  . Once a month, do a whole body skin exam, using a mirror to look at the skin on your back. Tell your health care provider of new moles, moles that have irregular borders, moles that are larger than a pencil eraser, or moles that have changed in shape or color.

## 2016-02-20 NOTE — Progress Notes (Signed)
PCP notes:  Health maintenance: Shingles vaccine postponed due insurance verification of coverage.   Abnormal screenings: none  Next appt: 02/25/16 @ 9:30AM

## 2016-02-20 NOTE — Progress Notes (Signed)
Subjective:   Luis Sweeney is a 75 y.o. male who presents for Medicare Annual/Subsequent preventive examination.  Review of Systems:  N/A Cardiac Risk Factors include: advanced age (>28men, >9 women);obesity (BMI >30kg/m2);male gender;dyslipidemia     Objective:    Vitals: BP 118/72 mmHg  Pulse 72  Temp(Src) 97.4 F (36.3 C) (Oral)  Ht 6' 0.5" (1.842 m)  Wt 259 lb (117.482 kg)  BMI 34.63 kg/m2  SpO2 95%  Body mass index is 34.63 kg/(m^2).  Tobacco History  Smoking status  . Former Smoker -- 1.50 packs/day for 3 years  . Quit date: 02/16/1985  Smokeless tobacco  . Never Used     Counseling given: No   Past Medical History  Diagnosis Date  . CAD (coronary artery disease) 2004    s/p MI treated with drug-eluting stent placement to the proximal left anterior descending in late 2004  . Renal insufficiency   . Hypercholesteremia   . Urticaria     Lat left arm  . Anemia, iron deficiency   . Helicobacter pylori gastritis     recurrent  . Personal history of colonic polyps   . Acute duodenal ulcer with hemorrhage but without obstruction     required  bld. transfusion  . GERD (gastroesophageal reflux disease)   . Hyperglycemia   . Myocardial infarct ([place])   . Diverticulosis of colon   . Hypertension   . Paralysis of upper limb (HCC)     R arm- birth injury  . H/O hiatal hernia   . Arthritis     OA- pt. reports its. minor  . NAFLD (nonalcoholic fatty liver disease) 2015    with liver cysts  . 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 Duodenal stricture - recurrent 09/21/2014   Past Surgical History  Procedure Laterality Date  . Shoulder surgery      right  . Wrist surgery    . Spinal cyst removal    . Knee surgery      left  . Coronary stent placement  2006    drug-eluting  . Hernia repair Right 2006    Dr. Hassell Done  . Esophagogastroduodenoscopy  11/26/10    mild gastritis, duodenitis, duodenal stricture (Dr. [name])  .  Colonoscopy  multiple    diverticulosis, int hem, h/o adenomatous polyps [name])  . Cardiac catheterization  2006    stent placed  . Anterior cervical decomp/discectomy fusion  03/2013    C3/4, C4/5 HNP (kritzer)  . Anterior cervical decomp/discectomy fusion N/A 03/15/2013    Procedure: ANTERIOR CERVICAL DECOMPRESSION/DISCECTOMY FUSION 2 LEVELS;  Surgeon: Faythe Ghee, MD;  Location: [facility];  Service: Neurosurgery;  Laterality: N/A;  Cervical three-four, cervical four-five anterior cervical diskectomy fusion with a trabecular metal plus plate   . Esophagogastroduodenoscopy  10//2015    duodenal stricture, ?recurrent ulcer [name])  . Colonoscopy  07/2014    severe diverticulosis, no rpt needed [name])   Family History  Problem Relation Age of Onset  . Diabetes Father   . Heart disease Father     CHF  . Kidney disease Father     kidney failure  . Heart failure Father   . Diabetes Sister   . Hypertension Sister   . Cancer Sister     pelvic mass  . Hypertension Sister   . Arthritis Sister   . Ovarian cancer Other    History  Sexual Activity  .  Sexual Activity: No    Comment: last few years.    Outpatient Encounter Prescriptions as of 02/20/2016  Medication Sig  . aspirin EC 81 MG tablet Take 81 mg by mouth daily.  . Cholecalciferol (VITAMIN D3) 5000 UNITS TABS Take 5,000 Units by mouth daily.  . cyclobenzaprine (FLEXERIL) 10 MG tablet Take 10 mg by mouth at bedtime.   . lovastatin (MEVACOR) 40 MG tablet Take 40 mg by mouth at bedtime.  . nabumetone (RELAFEN) 500 MG tablet Take 500 mg by mouth 2 (two) times daily.   . pantoprazole (PROTONIX) 40 MG tablet TAKE 1 TABLET BY MOUTH DAILY BEFORE BREAKFAST  . ramipril (ALTACE) 5 MG capsule TAKE 1 CAPSULE (5 MG TOTAL) BY MOUTH 2 (TWO) TIMES DAILY.  . vitamin B-12 (CYANOCOBALAMIN) 1000 MCG tablet Take 1,000 mcg by mouth daily.  . [DISCONTINUED] lovastatin (MEVACOR) 40 MG tablet TAKE ONE TABLET AT BEDTIME   No  facility-administered encounter medications on file as of 02/20/2016.    Activities of Daily Living In your present state of health, do you have any difficulty performing the following activities: 02/20/2016  Hearing? N  Vision? N  Difficulty concentrating or making decisions? N  Walking or climbing stairs? N  Dressing or bathing? N  Doing errands, shopping? N  Preparing Food and eating ? N  Using the Toilet? N  In the past six months, have you accidently leaked urine? N  Do you have problems with loss of bowel control? N  Managing your Medications? N  Managing your Finances? N  Housekeeping or managing your Housekeeping? N    Patient Care Team: Ria Bush, MD as PCP - General (Family Medicine) Estill Cotta, MD as Consulting Physician (Ophthalmology) Sherren Mocha, MD as Consulting Physician (Cardiology) Karie Chimera, MD as Consulting Physician (Neurosurgery)   Assessment:     Hearing Screening   125Hz  250Hz  500Hz  1000Hz  2000Hz  4000Hz  8000Hz   Right ear:   40 40 40 40   Left ear:   40 40 40 40   Comments: 40dHBL  Vision Screening Comments: Last eye exam in 2016   Exercise Activities and Dietary recommendations Current Exercise Habits: The patient does not participate in regular exercise at present, Exercise limited by: None identified  Goals    . Increase physical activity     Starting 02/20/2016, I will attempt to do at least 5-15 minutes of chair exercises and walk on my treadmill as time permits.       Fall Risk Fall Risk  02/20/2016 02/20/2015 06/01/2013  Falls in the past year? No No No   Depression Screen PHQ 2/9 Scores 02/20/2016 02/20/2015 06/01/2013 05/31/2012  PHQ - 2 Score 0 0 0 0    Cognitive Testing MMSE - Mini Mental State Exam 02/20/2016  Orientation to time 5  Orientation to Place 5  Registration 3  Attention/ Calculation 0  Recall 3  Language- name 2 objects 0  Language- repeat 1  Language- follow 3 step command 3  Language- read &  follow direction 0  Write a sentence 0  Copy design 0  Total score 20   PLEASE NOTE: A Mini-Cog screen was completed. Maximum score is 20. A value of 0 denotes this part of [place] MMSE was not completed or the patient failed this part of the Mini-Cog screening.   Mini-Cog Screening Orientation to Time - Max 5 pts Orientation to Place - Max 5 pts Registration - Max 3 pts Recall - Max 3 pts Language Repeat - Max 1  pts Language Follow 3 Step Command - Max 3 pts   Immunization History  Administered Date(s) Administered  . Influenza Whole 07/10/2008  . Pneumococcal Conjugate-13 02/20/2015  . Pneumococcal Polysaccharide-23 10/12/2002, 07/10/2008  . Td 11/13/2000, 05/31/2012   Screening Tests Health Maintenance  Topic Date Due  . ZOSTAVAX  02/19/2017 (Originally 11/04/2000)  . INFLUENZA VACCINE  05/12/2016  . TETANUS/TDAP  05/31/2022  . COLONOSCOPY  07/26/2024  . DTaP/Tdap/Td  Completed  . PNA vac Low Risk Adult  Completed      Plan:    I have personally reviewed and addressed the Medicare Annual Wellness questionnaire and have noted the following in the patient's chart:  A. Medical and social history B. Use of alcohol, tobacco or illicit drugs  C. Current medications and supplements D. Functional ability and status E.  Nutritional status F.  Physical activity G. Advance directives H. List of other physicians I.  Hospitalizations, surgeries, and ER visits in previous 12 months J.   to include hearing, vision, cognitive, depression L. Referrals and appointments - none  In addition, I have reviewed and discussed with patient certain preventive protocols, quality metrics, and best practice recommendations. A written personalized care plan for preventive services as well as general preventive health recommendations were provided to patient.  See attached scanned questionnaire for additional information.   Signed,   Lindell Noe, MHA, BS, LPN Health  Advisor [phone]

## 2016-02-20 NOTE — Progress Notes (Signed)
Pre visit review using our clinic review tool, if applicable. No additional management support is needed unless otherwise documented below in the visit note. 

## 2016-02-21 NOTE — Progress Notes (Signed)
I reviewed health advisor's note, was available for consultation, and agree with documentation and plan.  

## 2016-02-25 ENCOUNTER — Ambulatory Visit (INDEPENDENT_AMBULATORY_CARE_PROVIDER_SITE_OTHER): Payer: PPO | Admitting: Family Medicine

## 2016-02-25 ENCOUNTER — Encounter: Payer: Self-pay | Admitting: Family Medicine

## 2016-02-25 VITALS — BP 118/64 | HR 64 | Temp 97.9°F | Wt 258.5 lb

## 2016-02-25 DIAGNOSIS — E119 Type 2 diabetes mellitus without complications: Secondary | ICD-10-CM | POA: Diagnosis not present

## 2016-02-25 DIAGNOSIS — E1121 Type 2 diabetes mellitus with diabetic nephropathy: Secondary | ICD-10-CM | POA: Insufficient documentation

## 2016-02-25 DIAGNOSIS — E7800 Pure hypercholesterolemia, unspecified: Secondary | ICD-10-CM

## 2016-02-25 DIAGNOSIS — N183 Chronic kidney disease, stage 3 unspecified: Secondary | ICD-10-CM

## 2016-02-25 DIAGNOSIS — Z0000 Encounter for general adult medical examination without abnormal findings: Secondary | ICD-10-CM | POA: Insufficient documentation

## 2016-02-25 DIAGNOSIS — E118 Type 2 diabetes mellitus with unspecified complications: Secondary | ICD-10-CM | POA: Insufficient documentation

## 2016-02-25 DIAGNOSIS — Z0001 Encounter for general adult medical examination with abnormal findings: Secondary | ICD-10-CM | POA: Insufficient documentation

## 2016-02-25 DIAGNOSIS — I499 Cardiac arrhythmia, unspecified: Secondary | ICD-10-CM | POA: Diagnosis not present

## 2016-02-25 DIAGNOSIS — E669 Obesity, unspecified: Secondary | ICD-10-CM

## 2016-02-25 DIAGNOSIS — Z7189 Other specified counseling: Secondary | ICD-10-CM

## 2016-02-25 DIAGNOSIS — I2510 Atherosclerotic heart disease of native coronary artery without angina pectoris: Secondary | ICD-10-CM

## 2016-02-25 NOTE — Assessment & Plan Note (Signed)
Chronic. Discussed nabumetone in setting of CKD. Pt about to finish course. Cr stable.

## 2016-02-25 NOTE — Assessment & Plan Note (Signed)
Discussed healthy diet and lifestyle changes to affect sustainable weight loss  

## 2016-02-25 NOTE — Assessment & Plan Note (Signed)
Advanced directives: doesn't have set up. Wouldn't want prolonged life support.Would want oldest daughter (Cindy Burgess RN) to be HCPOA. Will work on this. 

## 2016-02-25 NOTE — Assessment & Plan Note (Addendum)
Check EKG today.  EKG NSR 80s, normal axis, intervals, no acute ST/T changes, ?q wave with nonspecific ST changes anteriorly (unchanged from prior EKG 09/2015), frequent PVCs

## 2016-02-25 NOTE — Progress Notes (Addendum)
BP 118/64 mmHg  Pulse 64  Temp(Src) 97.9 F (36.6 C) (Oral)  Wt 258 lb 8 oz (117.255 kg)   CC: CPE  Subjective:    Patient ID: Luis Sweeney, male    DOB: [birth year], 75 y.o.   MRN: [id number]  HPI: Luis Sweeney is a 75 y.o. male presenting on 02/25/2016 for Annual Exam   Medicare wellness visit last week with Luis Sweeney. Note reviewed.   Recent new dx diet controlled diabetes. Wife also recently dx. She completed DSME - and he went with her to these classes  Some neck pain recently - treated by NSG with flexeril and nabumentone which helped. Continues taking this.   Caring for wife.   Preventative: ESOPHAGOGASTRODUODENOSCOPY Date: 10//2015 duodenal stricture, ?recurrent ulcer Luis Sweeney) COLONOSCOPY Date: 07/2014 severe diverticulosis, no rpt needed Luis Sweeney) Prostate cancer screening - last checked 05/2013. No problems in past. Nocturia x1 per night. Strong stream. Requests final screen next year.  Flu - not received this year. Pneumovax 2004, 2009. prevnar 2016 Td - 05/2012 Discussed shingles shot - will think about this. Needs to check with insurance.  Advanced directives: doesn't have set up. Wouldn't want prolonged life support.Would want oldest daughter Luis Cook RN) to be [employer]. Will work on this. Seat belt use discussed Sunscreen use discussed. No changing moles on skin.   Daily caffeine use 3 per day  Lives with wife  Activity: no regular exercise  Diet: tries to avoid sweets, some water, fruits/vegetables occasionally   Relevant past medical, surgical, family and social history reviewed and updated as indicated. Interim medical history since our last visit reviewed. Allergies and medications reviewed and updated. Current Outpatient Prescriptions on File Prior to Visit  Medication Sig  . aspirin EC 81 MG tablet Take 81 mg by mouth daily.  . Cholecalciferol (VITAMIN D3) 5000 UNITS TABS Take 5,000 Units by mouth daily.  . cyclobenzaprine (FLEXERIL) 10 MG tablet  Take 10 mg by mouth at bedtime.   . lovastatin (MEVACOR) 40 MG tablet Take 40 mg by mouth at bedtime.  . nabumetone (RELAFEN) 500 MG tablet Take 500 mg by mouth 2 (two) times daily.   . pantoprazole (PROTONIX) 40 MG tablet TAKE 1 TABLET BY MOUTH DAILY BEFORE BREAKFAST  . ramipril (ALTACE) 5 MG capsule TAKE 1 CAPSULE (5 MG TOTAL) BY MOUTH 2 (TWO) TIMES DAILY.  . vitamin B-12 (CYANOCOBALAMIN) 1000 MCG tablet Take 1,000 mcg by mouth daily.   No current facility-administered medications on file prior to visit.    Review of Systems  Constitutional: Negative for fever, chills, activity change, appetite change, fatigue and unexpected weight change.  HENT: Negative for hearing loss.   Eyes: Negative for visual disturbance.  Respiratory: Negative for cough, chest tightness, shortness of breath and wheezing.   Cardiovascular: Negative for chest pain, palpitations and leg swelling.  Gastrointestinal: Negative for nausea, vomiting, abdominal pain, diarrhea, constipation, blood in stool and abdominal distention.       GERD occasionally  Genitourinary: Negative for hematuria and difficulty urinating.  Musculoskeletal: Positive for neck pain (see HPI). Negative for myalgias and arthralgias.  Skin: Negative for rash.  Neurological: Negative for dizziness, seizures, syncope and headaches.  Hematological: Negative for adenopathy. Bruises/bleeds easily.  Psychiatric/Behavioral: Negative for dysphoric mood. The patient is not nervous/anxious.    Per HPI unless specifically indicated in ROS section     Objective:    BP 118/64 mmHg  Pulse 64  Temp(Src) 97.9 F (36.6 C) (Oral)  Wt 258 lb 8  oz (117.255 kg)  Wt Readings from Last 3 Encounters:  02/25/16 258 lb 8 oz (117.255 kg)  02/20/16 259 lb (117.482 kg)  01/02/16 263 lb (119.296 kg)   Body mass index is 34.56 kg/(m^2).  Physical Exam  Constitutional: He is oriented to person, place, and time. He appears well-developed and well-nourished. No  distress.  HENT:  Head: Normocephalic and atraumatic.  Right Ear: Hearing, tympanic membrane, external ear and ear canal normal.  Left Ear: Hearing, tympanic membrane, external ear and ear canal normal.  Nose: Nose normal.  Mouth/Throat: Uvula is midline, oropharynx is clear and moist and mucous membranes are normal. No oropharyngeal exudate, posterior oropharyngeal edema or posterior oropharyngeal erythema.  Eyes: Conjunctivae and EOM are normal. Pupils are equal, round, and reactive to light. No scleral icterus.  Neck: Normal range of motion. Neck supple. Carotid bruit is not present. No thyromegaly present.  Cardiovascular: Normal rate, normal heart sounds and intact distal pulses.  An irregular rhythm present.  No murmur heard. Pulses:      Radial pulses are 2+ on the right side, and 2+ on the left side.  Pulmonary/Chest: Effort normal and breath sounds normal. No respiratory distress. He has no wheezes. He has no rales.  Abdominal: Soft. Bowel sounds are normal. He exhibits no distension and no mass. There is no tenderness. There is no rebound and no guarding.  Genitourinary: Rectum normal and prostate normal. Rectal exam shows no external hemorrhoid, no internal hemorrhoid, no fissure, no mass, no tenderness and anal tone normal. Prostate is not enlarged (20gm) and not tender.  Musculoskeletal: Normal range of motion. He exhibits no edema.  Lymphadenopathy:    He has no cervical adenopathy.  Neurological: He is alert and oriented to person, place, and time.  CN grossly intact, station and gait intact L hand deformity from h/o Erb's palsy  Skin: Skin is warm and dry. No rash noted.  Psychiatric: He has a normal mood and affect. His behavior is normal. Judgment and thought content normal.  Nursing note and vitals reviewed.  Results for orders placed or performed in visit on 02/20/16  Lipid panel  Result Value Ref Range   Cholesterol 124 0 - 200 mg/dL   Triglycerides 89.0 0.0 - 149.0  mg/dL   HDL 28.20 (L) >39.00 mg/dL   VLDL 17.8 0.0 - 40.0 mg/dL   LDL Cholesterol 78 0 - 99 mg/dL   Total CHOL/HDL Ratio 4    NonHDL 95.54   Comprehensive metabolic panel  Result Value Ref Range   Sodium 138 135 - 145 mEq/L   Potassium 4.6 3.5 - 5.1 mEq/L   Chloride 103 96 - 112 mEq/L   CO2 28 19 - 32 mEq/L   Glucose, Bld 110 (H) 70 - 99 mg/dL   BUN 26 (H) 6 - 23 mg/dL   Creatinine, Ser 1.87 (H) 0.40 - 1.50 mg/dL   Total Bilirubin 0.4 0.2 - 1.2 mg/dL   Alkaline Phosphatase 28 (L) 39 - 117 U/L   AST 17 0 - 37 U/L   ALT 14 0 - 53 U/L   Total Protein 6.2 6.0 - 8.3 g/dL   Albumin 4.0 3.5 - 5.2 g/dL   Calcium 9.0 8.4 - 10.5 mg/dL   GFR 37.57 (L) >60.00 mL/min  CBC with Differential/Platelet  Result Value Ref Range   WBC 6.0 4.0 - 10.5 K/uL   RBC 4.61 4.22 - 5.81 Mil/uL   Hemoglobin 13.9 13.0 - 17.0 g/dL   HCT 42.3 39.0 -  52.0 %   MCV 91.8 78.0 - 100.0 fl   MCHC 32.8 30.0 - 36.0 g/dL   RDW 14.1 11.5 - 15.5 %   Platelets 215.0 150.0 - 400.0 K/uL   Neutrophils Relative % 58.2 43.0 - 77.0 %   Lymphocytes Relative 30.6 12.0 - 46.0 %   Monocytes Relative 9.4 3.0 - 12.0 %   Eosinophils Relative 1.6 0.0 - 5.0 %   Basophils Relative 0.2 0.0 - 3.0 %   Neutro Abs 3.5 1.4 - 7.7 K/uL   Lymphs Abs 1.8 0.7 - 4.0 K/uL   Monocytes Absolute 0.6 0.1 - 1.0 K/uL   Eosinophils Absolute 0.1 0.0 - 0.7 K/uL   Basophils Absolute 0.0 0.0 - 0.1 K/uL  VITAMIN D 25 Hydroxy (Vit-D Deficiency, Fractures)  Result Value Ref Range   VITD 76.18 30.00 - 100.00 ng/mL  Microalbumin / creatinine urine ratio  Result Value Ref Range   Microalb, Ur <0.7 0.0 - 1.9 mg/dL   Creatinine,U 152.4 mg/dL   Microalb Creat Ratio 0.5 0.0 - 30.0 mg/g  Hemoglobin A1c  Result Value Ref Range   Hgb A1c MFr Bld 6.8 (H) 4.6 - 6.5 %  PSA, Medicare  Result Value Ref Range   PSA 1.16 0.10 - 4.00 ng/ml      Assessment & Plan:   Problem List Items Addressed This Visit    HYPERCHOLESTEROLEMIA    Chronic, stable. Encouraged  increased aerobic exercise to improve HDL. Continue statin.       Coronary atherosclerosis of native coronary artery    Asxs. Appreciate cards care of patient.      CKD (chronic kidney disease), stage III    Chronic. Discussed nabumetone in setting of CKD. Pt about to finish course. Cr stable.       Advanced care planning/counseling discussion    Advanced directives: doesn't have set up. Wouldn't want prolonged life support.Would want oldest daughter Luis Cook RN) to be [employer]. Will work on this.      Obesity, Class I, BMI 30-34.9    Discussed healthy diet and lifestyle changes to affect sustainable weight loss.      Health maintenance examination - Primary    Preventative protocols reviewed and updated unless pt declined. Discussed healthy diet and lifestyle.       Diet-controlled diabetes mellitus ([place])    Reviewed new dx with patient. He has completed DSME with wife recently. Discussed diet changes to improve glycemic control. RTC 6 mo DM f/u visit.      Irregular heart beat    Check EKG today.  EKG NSR 80s, normal axis, intervals, no acute ST/T changes, ?q wave with nonspecific ST changes anteriorly (unchanged from prior EKG 09/2015), frequent PVCs       Relevant Orders   EKG 12-Lead (Completed)       Follow up plan: Return in about 6 months (around 08/27/2016), or as needed, for follow up visit.  Ria Bush, MD

## 2016-02-25 NOTE — Assessment & Plan Note (Signed)
Preventative protocols reviewed and updated unless pt declined. Discussed healthy diet and lifestyle.  

## 2016-02-25 NOTE — Assessment & Plan Note (Signed)
Reviewed new dx with patient. He has completed DSME with wife recently. Discussed diet changes to improve glycemic control. RTC 6 mo DM f/u visit.

## 2016-02-25 NOTE — Assessment & Plan Note (Signed)
Asxs. Appreciate cards care of patient.

## 2016-02-25 NOTE — Patient Instructions (Addendum)
Call your insurance about the shingles shot to see if it is covered or how much it would cost and where is cheaper (here or pharmacy).  If you want to receive here, call for nurse visit.  Work on living will.  Sugars are too high - diet controlled diabetes range. Avoid added sugars, drink plenty of water, avoid simple carbs. Return in 6 months for follow up diabetes. EKG today.  Good to see you today, call us with questions.  Return as needed or in 1 year for next physical.   Health Maintenance, Male A healthy lifestyle and preventative care can promote health and wellness.  Maintain regular health, dental, and eye exams.  Eat a healthy diet. Foods like vegetables, fruits, whole grains, low-fat dairy products, and lean protein foods contain the nutrients you need and are low in calories. Decrease your intake of foods high in solid fats, added sugars, and salt. Get information about a proper diet from your health care provider, if necessary.  Regular physical exercise is one of the most important things you can do for your health. Most adults should get at least 150 minutes of moderate-intensity exercise (any activity that increases your heart rate and causes you to sweat) each week. In addition, most adults need muscle-strengthening exercises on 2 or more days a week.   Maintain a healthy weight. The body mass index (BMI) is a screening tool to identify possible weight problems. It provides an estimate of body fat based on height and weight. Your health care provider can find your BMI and can help you achieve or maintain a healthy weight. For males 20 years and older:  A BMI below 18.5 is considered underweight.  A BMI of 18.5 to 24.9 is normal.  A BMI of 25 to 29.9 is considered overweight.  A BMI of 30 and above is considered obese.  Maintain normal blood lipids and cholesterol by exercising and minimizing your intake of saturated fat. Eat a balanced diet with plenty of fruits and  vegetables. Blood tests for lipids and cholesterol should begin at age 38 and be repeated every 5 years. If your lipid or cholesterol levels are high, you are over age 61, or you are at high risk for heart disease, you may need your cholesterol levels checked more frequently.Ongoing high lipid and cholesterol levels should be treated with medicines if diet and exercise are not working.  If you smoke, find out from your health care provider how to quit. If you do not use tobacco, do not start.  Lung cancer screening is recommended for adults aged 27-80 years who are at high risk for developing lung cancer because of a history of smoking. A yearly low-dose CT scan of the lungs is recommended for people who have at least a 30-pack-year history of smoking and are current smokers or have quit within the past 15 years. A pack year of smoking is smoking an average of 1 pack of cigarettes a day for 1 year (for example, a 30-pack-year history of smoking could mean smoking 1 pack a day for 30 years or 2 packs a day for 15 years). Yearly screening should continue until the smoker has stopped smoking for at least 15 years. Yearly screening should be stopped for people who develop a health problem that would prevent them from having lung cancer treatment.  If you choose to drink alcohol, do not have more than 2 drinks per day. One drink is considered to be 12 oz (360 mL)  of beer, 5 oz (150 mL) of wine, or 1.5 oz (45 mL) of liquor.  Avoid the use of street drugs. Do not share needles with anyone. Ask for help if you need support or instructions about stopping the use of drugs.  High blood pressure causes heart disease and increases the risk of stroke. High blood pressure is more likely to develop in:  People who have blood pressure in the end of the normal range (100-139/85-89 mm Hg).  People who are overweight or obese.  People who are African American.  If you are 45-53 years of age, have your blood pressure  checked every 3-5 years. If you are 14 years of age or older, have your blood pressure checked every year. You should have your blood pressure measured twice--once when you are at a hospital or clinic, and once when you are not at a hospital or clinic. Record the average of the two measurements. To check your blood pressure when you are not at a hospital or clinic, you can use:  An automated blood pressure machine at a pharmacy.  A home blood pressure monitor.  If you are 65-27 years old, ask your health care provider if you should take aspirin to prevent heart disease.  Diabetes screening involves taking a blood sample to check your fasting blood sugar level. This should be done once every 3 years after age 28 if you are at a normal weight and without risk factors for diabetes. Testing should be considered at a younger age or be carried out more frequently if you are overweight and have at least 1 risk factor for diabetes.  Colorectal cancer can be detected and often prevented. Most routine colorectal cancer screening begins at the age of 63 and continues through age 58. However, your health care provider may recommend screening at an earlier age if you have risk factors for colon cancer. On a yearly basis, your health care provider may provide home test kits to check for hidden blood in the stool. A small camera at the end of a tube may be used to directly examine the colon (sigmoidoscopy or colonoscopy) to detect the earliest forms of colorectal cancer. Talk to your health care provider about this at age 69 when routine screening begins. A direct exam of the colon should be repeated every 5-10 years through age 58, unless early forms of precancerous polyps or small growths are found.  People who are at an increased risk for hepatitis B should be screened for this virus. You are considered at high risk for hepatitis B if:  You were born in a country where hepatitis B occurs often. Talk with your  health care provider about which countries are considered high risk.  Your parents were born in a high-risk country and you have not received a shot to protect against hepatitis B (hepatitis B vaccine).  You have HIV or AIDS.  You use needles to inject street drugs.  You live with, or have sex with, someone who has hepatitis B.  You are a man who has sex with other men (MSM).  You get hemodialysis treatment.  You take certain medicines for conditions like cancer, organ transplantation, and autoimmune conditions.  Hepatitis C blood testing is recommended for all people born from [birth year] through 1965 and any individual with known risk factors for hepatitis C.  Healthy men should no longer receive prostate-specific antigen (PSA) blood tests as part of routine cancer screening. Talk to your health care provider about  prostate cancer screening.  Testicular cancer screening is not recommended for adolescents or adult males who have no symptoms. Screening includes self-exam, a health care provider exam, and other screening tests. Consult with your health care provider about any symptoms you have or any concerns you have about testicular cancer.  Practice safe sex. Use condoms and avoid high-risk sexual practices to reduce the spread of sexually transmitted infections (STIs).  You should be screened for STIs, including gonorrhea and chlamydia if:  You are sexually active and are younger than 24 years.  You are older than 24 years, and your health care provider tells you that you are at risk for this type of infection.  Your sexual activity has changed since you were last screened, and you are at an increased risk for chlamydia or gonorrhea. Ask your health care provider if you are at risk.  If you are at risk of being infected with HIV, it is recommended that you take a prescription medicine daily to prevent HIV infection. This is called pre-exposure prophylaxis (PrEP). You are considered at  risk if:  You are a man who has sex with other men (MSM).  You are a heterosexual man who is sexually active with multiple partners.  You take drugs by injection.  You are sexually active with a partner who has HIV.  Talk with your health care provider about whether you are at high risk of being infected with HIV. If you choose to begin PrEP, you should first be tested for HIV. You should then be tested every 3 months for as long as you are taking PrEP.  Use sunscreen. Apply sunscreen liberally and repeatedly throughout the day. You should seek shade when your shadow is shorter than you. Protect yourself by wearing long sleeves, pants, a wide-brimmed hat, and sunglasses year round whenever you are outdoors.  Tell your health care provider of new moles or changes in moles, especially if there is a change in shape or color. Also, tell your health care provider if a mole is larger than the size of a pencil eraser.  A one-time screening for abdominal aortic aneurysm (AAA) and surgical repair of large AAAs by ultrasound is recommended for men aged 60-75 years who are current or former smokers.  Stay current with your vaccines (immunizations).   This information is not intended to replace advice given to you by your health care provider. Make sure you discuss any questions you have with your health care provider.   Document Released: 03/26/2008 Document Revised: 10/19/2014 Document Reviewed: 02/23/2011 Elsevier Interactive Patient Education [employer].

## 2016-02-25 NOTE — Assessment & Plan Note (Signed)
Chronic, stable. Encouraged increased aerobic exercise to improve HDL. Continue statin.

## 2016-02-25 NOTE — Progress Notes (Signed)
Pre visit review using our clinic review tool, if applicable. No additional management support is needed unless otherwise documented below in the visit note. 

## 2016-03-01 NOTE — Addendum Note (Signed)
Addended by: Ria Bush on: 03/01/2016 11:34 AM   Modules accepted: Miquel Dunn

## 2016-03-02 ENCOUNTER — Telehealth: Payer: Self-pay | Admitting: Family Medicine

## 2016-03-02 NOTE — Telephone Encounter (Signed)
Patient returned Melanie's call. °

## 2016-03-02 NOTE — Telephone Encounter (Signed)
Called patient back and results given per Dr. Darnell Level.

## 2016-05-08 IMAGING — NM NM HEPATO W/GB/PHARM/[PERSON_NAME]
3 series · 13 of 13 positions shown · non-contrast
Comparison: CT abdomen 09/27/2010 ultrasound 11/30/2013

CLINICAL DATA: Abdominal pain, right upper quadrant pain that is
intermittent for 6 months

EXAM:
NUCLEAR MEDICINE HEPATOBILIARY IMAGING WITH GALLBLADDER EF
TECHNIQUE: Sequential images of the abdomen were obtained [DATE] minutes
following intravenous administration of radiopharmaceutical. After
slow intravenous infusion of 2.3 micrograms Cholecystokinin,
gallbladder ejection fraction was determined.
RADIOPHARMACEUTICALS:  5.0 Millicurie 9c-AAm Choletec

[he hepatobiliary · 1 of 1 slices shown (1 of 3)]
[im 1/1]
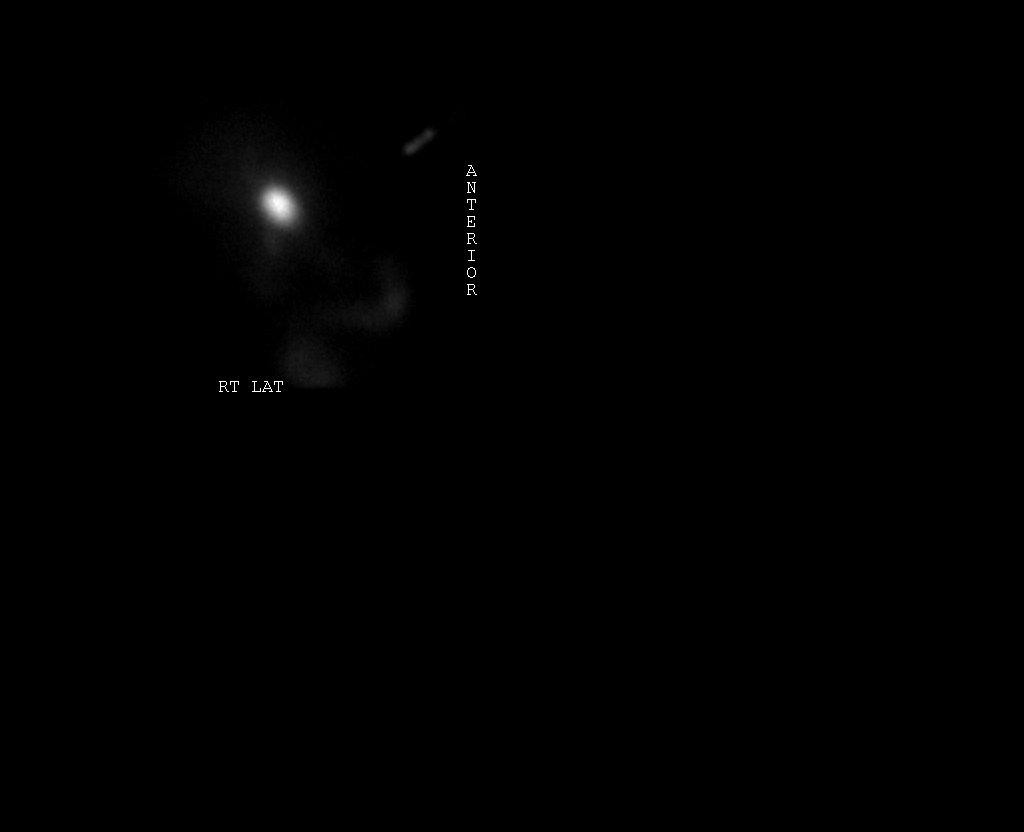

[he hepatobiliary · 3.43mm/px · 6 of 60 frames shown (2 of 3)]
[frame 6/60]
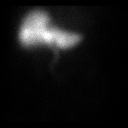
[frame 16/60]
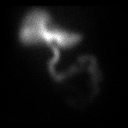
[frame 26/60]
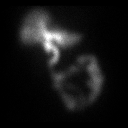
[frame 36/60]
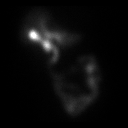
[frame 46/60]
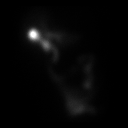
[frame 56/60]
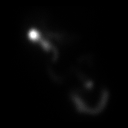

[he hepatobiliary · 3.43mm/px · 6 of 30 frames shown (3 of 3)]
[frame 3/30]
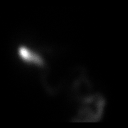
[frame 8/30]
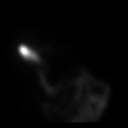
[frame 13/30]
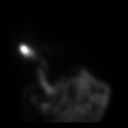
[frame 18/30]
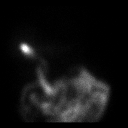
[frame 23/30]
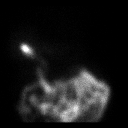
[frame 28/30]
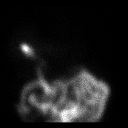

[13 of 13 positions shown; findings below may reference images not displayed]

FINDINGS: There is homogeneous radiotracer uptake within the liver. The
gallbladder is evident by 40 min. Counts are present within the
small bowel by 15 min. Administration of CCK resulted in appropriate
contraction of the gallbladder. Gallbladder ejection fraction equals
86%. At 30 min, normal ejection fraction is greater than 30%.
IMPRESSION: 1. Patent cystic duct and common bile duct.
2. Normal gallbladder ejection fraction of 86%

## 2016-05-25 ENCOUNTER — Other Ambulatory Visit: Payer: Self-pay | Admitting: Family Medicine

## 2016-07-02 ENCOUNTER — Other Ambulatory Visit: Payer: Self-pay | Admitting: Internal Medicine

## 2016-07-04 ENCOUNTER — Encounter (HOSPITAL_COMMUNITY): Payer: Self-pay | Admitting: Emergency Medicine

## 2016-07-04 ENCOUNTER — Emergency Department (HOSPITAL_COMMUNITY): Payer: PPO

## 2016-07-04 ENCOUNTER — Other Ambulatory Visit: Payer: Self-pay

## 2016-07-04 ENCOUNTER — Observation Stay (HOSPITAL_COMMUNITY)
Admission: EM | Admit: 2016-07-04 | Discharge: 2016-07-04 | Disposition: A | Discharge status: Home / Self Care | Payer: PPO | Attending: Internal Medicine | Admitting: Internal Medicine

## 2016-07-04 DIAGNOSIS — Z86010 Personal history of colonic polyps: Secondary | ICD-10-CM | POA: Diagnosis not present

## 2016-07-04 DIAGNOSIS — N183 Chronic kidney disease, stage 3 (moderate): Secondary | ICD-10-CM | POA: Diagnosis not present

## 2016-07-04 DIAGNOSIS — E7800 Pure hypercholesterolemia, unspecified: Secondary | ICD-10-CM | POA: Diagnosis not present

## 2016-07-04 DIAGNOSIS — I2510 Atherosclerotic heart disease of native coronary artery without angina pectoris: Secondary | ICD-10-CM | POA: Diagnosis not present

## 2016-07-04 DIAGNOSIS — Z8719 Personal history of other diseases of the digestive system: Secondary | ICD-10-CM | POA: Insufficient documentation

## 2016-07-04 DIAGNOSIS — Z981 Arthrodesis status: Secondary | ICD-10-CM | POA: Diagnosis not present

## 2016-07-04 DIAGNOSIS — K219 Gastro-esophageal reflux disease without esophagitis: Secondary | ICD-10-CM | POA: Insufficient documentation

## 2016-07-04 DIAGNOSIS — I25119 Atherosclerotic heart disease of native coronary artery with unspecified angina pectoris: Secondary | ICD-10-CM | POA: Diagnosis not present

## 2016-07-04 DIAGNOSIS — Z7982 Long term (current) use of aspirin: Secondary | ICD-10-CM | POA: Diagnosis not present

## 2016-07-04 DIAGNOSIS — Z6832 Body mass index (BMI) 32.0-32.9, adult: Secondary | ICD-10-CM | POA: Diagnosis not present

## 2016-07-04 DIAGNOSIS — R918 Other nonspecific abnormal finding of lung field: Secondary | ICD-10-CM | POA: Diagnosis not present

## 2016-07-04 DIAGNOSIS — I25118 Atherosclerotic heart disease of native coronary artery with other forms of angina pectoris: Secondary | ICD-10-CM | POA: Diagnosis present

## 2016-07-04 DIAGNOSIS — Z87891 Personal history of nicotine dependence: Secondary | ICD-10-CM | POA: Insufficient documentation

## 2016-07-04 DIAGNOSIS — Z888 Allergy status to other drugs, medicaments and biological substances status: Secondary | ICD-10-CM | POA: Diagnosis not present

## 2016-07-04 DIAGNOSIS — Z955 Presence of coronary angioplasty implant and graft: Secondary | ICD-10-CM | POA: Insufficient documentation

## 2016-07-04 DIAGNOSIS — M1990 Unspecified osteoarthritis, unspecified site: Secondary | ICD-10-CM | POA: Diagnosis not present

## 2016-07-04 DIAGNOSIS — Z885 Allergy status to narcotic agent status: Secondary | ICD-10-CM | POA: Diagnosis not present

## 2016-07-04 DIAGNOSIS — R6884 Jaw pain: Secondary | ICD-10-CM | POA: Diagnosis not present

## 2016-07-04 DIAGNOSIS — R079 Chest pain, unspecified: Principal | ICD-10-CM | POA: Diagnosis present

## 2016-07-04 DIAGNOSIS — I200 Unstable angina: Secondary | ICD-10-CM | POA: Diagnosis not present

## 2016-07-04 DIAGNOSIS — I129 Hypertensive chronic kidney disease with stage 1 through stage 4 chronic kidney disease, or unspecified chronic kidney disease: Secondary | ICD-10-CM | POA: Insufficient documentation

## 2016-07-04 DIAGNOSIS — K760 Fatty (change of) liver, not elsewhere classified: Secondary | ICD-10-CM | POA: Insufficient documentation

## 2016-07-04 DIAGNOSIS — I252 Old myocardial infarction: Secondary | ICD-10-CM | POA: Diagnosis not present

## 2016-07-04 LAB — CBC
HCT: 42 % (ref 39.0–52.0)
Hemoglobin: 13.6 g/dL (ref 13.0–17.0)
MCH: 30.6 pg (ref 26.0–34.0)
MCHC: 32.4 g/dL (ref 30.0–36.0)
MCV: 94.4 fL (ref 78.0–100.0)
Platelets: 247 10*3/uL (ref 150–400)
RBC: 4.45 MIL/uL (ref 4.22–5.81)
RDW: 13.9 % (ref 11.5–15.5)
WBC: 8.3 10*3/uL (ref 4.0–10.5)

## 2016-07-04 LAB — BASIC METABOLIC PANEL
Anion gap: 5 (ref 5–15)
BUN: 30 mg/dL — ABNORMAL HIGH (ref 6–20)
CO2: 28 mmol/L (ref 22–32)
Calcium: 9 mg/dL (ref 8.9–10.3)
Chloride: 103 mmol/L (ref 101–111)
Creatinine, Ser: 1.98 mg/dL — ABNORMAL HIGH (ref 0.61–1.24)
GFR calc Af Amer: 36 mL/min — ABNORMAL LOW (ref >60)
GFR calc non Af Amer: 31 mL/min — ABNORMAL LOW (ref >60)
Glucose, Bld: 130 mg/dL — ABNORMAL HIGH (ref 65–99)
Potassium: 4.2 mmol/L (ref 3.5–5.1)
Sodium: 136 mmol/L (ref 135–145)

## 2016-07-04 LAB — TROPONIN I
Troponin I: <0.03 ng/mL (ref <0.03)
Troponin I: <0.03 ng/mL (ref <0.03)

## 2016-07-04 LAB — I-STAT TROPONIN, ED: Troponin i, poc: 0 ng/mL (ref 0.00–0.08)

## 2016-07-04 MED ORDER — PANTOPRAZOLE SODIUM 40 MG PO TBEC
40.0000 mg | DELAYED_RELEASE_TABLET | Freq: Every day | ORAL | Status: DC
  Administered 2016-07-04: 40 mg via ORAL
  Filled 2016-07-04: qty 1

## 2016-07-04 MED ORDER — CARVEDILOL 3.125 MG PO TABS
3.1250 mg | ORAL_TABLET | Freq: Two times a day (BID) | ORAL | Status: DC
  Administered 2016-07-04: 3.125 mg via ORAL
  Filled 2016-07-04: qty 1

## 2016-07-04 MED ORDER — ASPIRIN EC 81 MG PO TBEC
81.0000 mg | DELAYED_RELEASE_TABLET | Freq: Every day | ORAL | Status: DC
  Administered 2016-07-04: 81 mg via ORAL
  Filled 2016-07-04: qty 1

## 2016-07-04 MED ORDER — PANTOPRAZOLE SODIUM 40 MG PO TBEC: 40.0000 mg | DELAYED_RELEASE_TABLET | Freq: Two times a day (BID) | ORAL | Status: DC

## 2016-07-04 MED ORDER — NITROGLYCERIN 2 % TD OINT
1.0000 [in_us] | TOPICAL_OINTMENT | Freq: Four times a day (QID) | TRANSDERMAL | Status: DC
  Administered 2016-07-04 (×2): 1 [in_us] via TOPICAL
  Filled 2016-07-04: qty 30

## 2016-07-04 MED ORDER — ISOSORBIDE MONONITRATE ER 30 MG PO TB24: 30.0000 mg | ORAL_TABLET | Freq: Every day | ORAL | 1 refills | Status: DC

## 2016-07-04 MED ORDER — ISOSORBIDE MONONITRATE ER 30 MG PO TB24
30.0000 mg | ORAL_TABLET | Freq: Every day | ORAL | Status: DC
  Administered 2016-07-04: 30 mg via ORAL
  Filled 2016-07-04: qty 1

## 2016-07-04 MED ORDER — ASPIRIN 81 MG PO CHEW
324.0000 mg | CHEWABLE_TABLET | Freq: Once | ORAL | Status: AC
  Administered 2016-07-04: 324 mg via ORAL
  Filled 2016-07-04: qty 4

## 2016-07-04 MED ORDER — RAMIPRIL 5 MG PO CAPS
5.0000 mg | ORAL_CAPSULE | Freq: Two times a day (BID) | ORAL | Status: DC
  Administered 2016-07-04: 5 mg via ORAL
  Filled 2016-07-04: qty 1

## 2016-07-04 MED ORDER — ENOXAPARIN SODIUM 40 MG/0.4ML ~~LOC~~ SOLN
40.0000 mg | SUBCUTANEOUS | Status: DC
  Administered 2016-07-04: 40 mg via SUBCUTANEOUS
  Filled 2016-07-04: qty 0.4

## 2016-07-04 MED ORDER — ACETAMINOPHEN 325 MG PO TABS: 650.0000 mg | ORAL_TABLET | ORAL | Status: DC | PRN

## 2016-07-04 MED ORDER — CARVEDILOL 3.125 MG PO TABS: 3.1250 mg | ORAL_TABLET | Freq: Two times a day (BID) | ORAL | 1 refills | Status: DC

## 2016-07-04 MED ORDER — PRAVASTATIN SODIUM 40 MG PO TABS: 40.0000 mg | ORAL_TABLET | Freq: Every day | ORAL | Status: DC

## 2016-07-04 MED ORDER — PANTOPRAZOLE SODIUM 40 MG PO TBEC: 40.0000 mg | DELAYED_RELEASE_TABLET | Freq: Two times a day (BID) | ORAL | 1 refills | Status: DC

## 2016-07-04 MED ORDER — NITROGLYCERIN 2 % TD OINT
1.0000 [in_us] | TOPICAL_OINTMENT | Freq: Once | TRANSDERMAL | Status: AC
  Administered 2016-07-04: 1 [in_us] via TOPICAL
  Filled 2016-07-04: qty 1

## 2016-07-04 MED ORDER — ONDANSETRON HCL 4 MG/2ML IJ SOLN: 4.0000 mg | Freq: Four times a day (QID) | INTRAMUSCULAR | Status: DC | PRN

## 2016-07-04 MED ORDER — GI COCKTAIL ~~LOC~~: 30.0000 mL | Freq: Four times a day (QID) | ORAL | Status: DC | PRN

## 2016-07-04 NOTE — Discharge Summary (Signed)
Physician Discharge Summary  Luis Sweeney [id number] DOB: [birth year] DOA: 07/04/2016  PCP: Ria Bush, MD  Admit date: 07/04/2016 Discharge date: 07/04/2016  Time spent: 30 minutes  Recommendations for Outpatient Follow-up:  1. Repeat BMET to follow electrolytes and renal function  2. Please reassess BP and adjust antihypertensive regimen as needed    Discharge Diagnoses:  Principal Problem:   Chest pain Active Problems:   Coronary atherosclerosis of native coronary artery   Benign essential HTN   HLD (hyperlipidemia)   Morbid obesity due to excess calories [facility])   Discharge Condition: stable and improved. Will discharge home with instructions to follow up with PCP in 2 weeks and to return on Tuesday night (9/26) for admission/IV pre-cath hydration and have cath on Wednesday 9/27 by Dr. Burt Knack  Diet recommendation: heart healthy diet and low calorie diet   Filed Weights   07/04/16 0515  Weight: 115.8 kg (255 lb 4.8 oz)    History of present illness:  75 y.o. male with medical history significant of CAD with stent in 2004 to LAD, HTN, MI, CKD stage 3, duodenal ulcers.  Patient presents to the ED with c/o chest pain.  Pain is located in patients R chest with radiation to right jaw.  Symptoms onset 3 weeks ago with significant worsening 3 hours ago.  Pain 9/10 at worst, improved to 4/10 after SL NTG.  Presented to the ED.  NGT paste and pain is resolved at this point.  Hospital Course:  1-Chest pain with know hx of CAD: heart score of 4-5 and concerns for angina equivalent -chest pain free now -no acute ischemic changes on EKG -r/o for acute ACS with neg troponin X 3 -cardiology planning for heart cath on Wednesday  -will discharge home on ASA, coreg, Imdur and statins  -advise to follow low sodium diet   2-HTN: -stable and well control overall -will continue current medication regimen  -follow for any orthostatic changes given use of Imdur now -advise to follow  heart healthy diet   3-HLD: -will continue statins   4-GERD: -will continue PPI, but will change to BID for better symptoms control  5-CKD stage 3 -stable -recommend BMET to follow electrolytes and renal function trend  -prior to Cath will benefit of good hydration   6-obesity -Body mass index is 32.78 kg/m. -low calorie diet discussed with patient   *rest of medical problems remains stable and plan is to continue current medication regimen.  Procedures:  See below for x-ray reports   Consultations:  Cardiology   Discharge Exam: Vitals:   07/04/16 1356 07/04/16 1614  BP: (!) 128/57 110/80  Pulse: 83 80  Resp: 18 18  Temp: 97.8 F (36.6 C) 97.7 F (36.5 C)    General: afebrile, in no acute distress and no longer complaining of CP or SOB. Cardiovascular: S1 and S2, no rubs, no gallops Respiratory: good air movement, no wheezing, no crackles  Abd: soft, NT, positive BS Extremities: trace edema bilaterally, no cyanosis   Discharge Instructions   Discharge Instructions    Diet - low sodium heart healthy    Complete by:  As directed    Discharge instructions    Complete by:  As directed    Please follow heart healthy diet Follow up with cardiology service as instructed  Keep yourself well hydrated   Increase activity slowly    Complete by:  As directed      Current Discharge Medication List    START taking these medications  Details  carvedilol (COREG) 3.125 MG tablet Take 1 tablet (3.125 mg total) by mouth 2 (two) times daily with a meal. Qty: 60 tablet, Refills: 1    isosorbide mononitrate (IMDUR) 30 MG 24 hr tablet Take 1 tablet (30 mg total) by mouth daily. Qty: 30 tablet, Refills: 1      CONTINUE these medications which have CHANGED   Details  pantoprazole (PROTONIX) 40 MG tablet Take 1 tablet (40 mg total) by mouth 2 (two) times daily. Qty: 60 tablet, Refills: 1      CONTINUE these medications which have NOT CHANGED   Details  aspirin EC  81 MG tablet Take 81 mg by mouth daily.    Cholecalciferol (VITAMIN D3) 5000 UNITS TABS Take 5,000 Units by mouth daily.    lovastatin (MEVACOR) 40 MG tablet TAKE ONE TABLET AT BEDTIME Qty: 90 tablet, Refills: 0    ramipril (ALTACE) 5 MG capsule TAKE 1 CAPSULE (5 MG TOTAL) BY MOUTH 2 (TWO) TIMES DAILY. Qty: 180 capsule, Refills: 3    vitamin B-12 (CYANOCOBALAMIN) 1000 MCG tablet Take 1,000 mcg by mouth daily.       Allergies  Allergen Reactions  . Codeine Hives and Itching  . Lipitor [Atorvastatin] Other (See Comments)    Reaction:  Muscle pain   . Morphine Hives and Itching  . Simvastatin Other (See Comments)    Reaction:  Muscle pain    Follow-up Information    Ria Bush, MD. Schedule an appointment as soon as possible for a visit in 2 week(s).   Specialty:  Family Medicine Contact information: [place] [address] [phone]           The results of significant diagnostics from this hospitalization (including imaging, microbiology, ancillary and laboratory) are listed below for reference.    Significant Diagnostic Studies: Dg Chest 2 View  Result Date: 07/04/2016 CLINICAL DATA:  Acute onset of right-sided chest pain, radiating to the jaw. Initial encounter. EXAM: CHEST  2 VIEW COMPARISON:  Chest radiograph performed 09/11/2015 FINDINGS: The lungs are well-aerated. Mild left basilar opacity likely reflects atelectasis. There is no evidence of pleural effusion or pneumothorax. The heart is normal in size; the mediastinal contour is within normal limits. No acute osseous abnormalities are seen. Cervical spinal fusion hardware is noted. IMPRESSION: Mild left basilar airspace opacity likely reflects atelectasis. Lungs otherwise clear. Electronically Signed   By: Garald Balding M.D.   On: 07/04/2016 01:46    Microbiology: No results found for this or any previous visit (from the past 240 hour(s)).   Labs: Basic Metabolic Panel:  Recent  Labs Lab 07/04/16 0050  NA 136  K 4.2  CL 103  CO2 28  GLUCOSE 130*  BUN 30*  CREATININE 1.98*  CALCIUM 9.0   CBC:  Recent Labs Lab 07/04/16 0050  WBC 8.3  HGB 13.6  HCT 42.0  MCV 94.4  PLT 247   Cardiac Enzymes:  Recent Labs Lab 07/04/16 0534 07/04/16 0852  TROPONINI <0.03 <0.03    Signed:  Barton Dubois MD.  [facility] 07/04/2016, 4:35 PM

## 2016-07-04 NOTE — H&P (Signed)
History and Physical    Luis Sweeney [id number] DOB: Apr 24, 1941 DOA: 07/04/2016   PCP: Ria Bush, MD Chief Complaint:  Chief Complaint  Patient presents with  . Chest Pain    HPI: Luis Sweeney is a 75 y.o. male with medical history significant of CAD with stent in 2004 to LAD, HTN, MI, CKD stage 3, duodenal ulcers.  Patient presents to the ED with c/o chest pain.  Pain is located in patients R chest with radiation to right jaw.  Symptoms onset 3 weeks ago with significant worsening 3 hours ago.  Pain 9/10 at worst, improved to 4/10 after SL NTG.  Presented to the ED.  NGT paste and pain is resolved at this point.  ED Course: Trop negative, pain resolved with NGT paste, EKG unremarkable.  Review of Systems: As per HPI otherwise 10 point review of systems negative.    Past Medical History:  Diagnosis Date  . Acute duodenal ulcer with hemorrhage but without obstruction    required  bld. transfusion  . Anemia, iron deficiency   . Arthritis    OA- pt. reports its. minor  . CAD (coronary artery disease) 2004   s/p MI treated with drug-eluting stent placement to the proximal left anterior descending in late 2004  . Diverticulosis of colon   . Duodenal stricture - recurrent 09/21/2014  . 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 GERD (gastroesophageal reflux disease)   . H/O hiatal hernia   . Helicobacter pylori gastritis    recurrent  . Hypercholesteremia   . Hyperglycemia   . Hypertension   . Myocardial infarct ([place])   . NAFLD (nonalcoholic fatty liver disease) 2015   with liver cysts  . Paralysis of upper limb (HCC)    R arm- birth injury  . Personal history of colonic polyps   . Renal insufficiency   . Urticaria    Lat left arm    Past Surgical History:  Procedure Laterality Date  . ANTERIOR CERVICAL DECOMP/DISCECTOMY FUSION  03/2013   C3/4, C4/5 HNP (kritzer)  . ANTERIOR CERVICAL DECOMP/DISCECTOMY FUSION N/A 03/15/2013   Procedure: ANTERIOR CERVICAL DECOMPRESSION/DISCECTOMY FUSION 2 LEVELS;  Surgeon: Faythe Ghee, MD;  Location: [facility];  Service: Neurosurgery;  Laterality: N/A;  Cervical three-four, cervical four-five anterior cervical diskectomy fusion with a trabecular metal plus plate   . CARDIAC CATHETERIZATION  2006   stent placed  . COLONOSCOPY  multiple   diverticulosis, int hem, h/o adenomatous polyps [name])  . COLONOSCOPY  07/2014   severe diverticulosis, no rpt needed [name])  . CORONARY STENT PLACEMENT  2006   drug-eluting  . ESOPHAGOGASTRODUODENOSCOPY  11/26/10   mild gastritis, duodenitis, duodenal stricture (Dr. [name])  . ESOPHAGOGASTRODUODENOSCOPY  10//2015   duodenal stricture, ?recurrent ulcer [name])  . HERNIA REPAIR Right 2006   Dr. Hassell Done  . KNEE SURGERY     left  . SHOULDER SURGERY     right  . spinal cyst removal    . WRIST SURGERY       reports that he quit smoking about 31 years ago. He has a 4.50 pack-year smoking history. He has never used smokeless tobacco. He reports that he drinks alcohol. He reports that he does not use drugs.  Allergies  Allergen Reactions  . Codeine Hives and Itching  . Lipitor [Atorvastatin] Other (See Comments)    Reaction:  Muscle pain   . Morphine Hives and Itching  .  Simvastatin Other (See Comments)    Reaction:  Muscle pain     Family History  Problem Relation Age of Onset  . Diabetes Father   . Heart disease Father     CHF  . Kidney disease Father     kidney failure  . Heart failure Father   . Diabetes Sister   . Hypertension Sister   . Cancer Sister     pelvic mass  . Hypertension Sister   . Arthritis Sister   . Ovarian cancer Other       Prior to Admission medications   Medication Sig Start Date End Date Taking? Authorizing Provider  aspirin EC 81 MG tablet Take 81 mg by mouth daily.   Yes Historical Provider, MD  Cholecalciferol (VITAMIN D3) 5000 UNITS TABS Take 5,000 Units by mouth daily.   Yes  Historical Provider, MD  lovastatin (MEVACOR) 40 MG tablet TAKE ONE TABLET AT BEDTIME 05/25/16  Yes Ria Bush, MD  pantoprazole (PROTONIX) 40 MG tablet TAKE 1 TABLET BY MOUTH DAILY BEFORE BREAKFAST 07/02/16  Yes Gatha Mayer, MD  ramipril (ALTACE) 5 MG capsule TAKE 1 CAPSULE (5 MG TOTAL) BY MOUTH 2 (TWO) TIMES DAILY. 10/16/15  Yes Sherren Mocha, MD  vitamin B-12 (CYANOCOBALAMIN) 1000 MCG tablet Take 1,000 mcg by mouth daily.   Yes Historical Provider, MD    Physical Exam: Vitals:   07/04/16 0230 07/04/16 0300 07/04/16 0330 07/04/16 0400  BP: 114/61 122/63 135/58 140/64  Pulse: 71 62 65 65  Resp: 16 13 15 18   Temp:      TempSrc:      SpO2: 97% 93% 96% 97%      Constitutional: NAD, calm, comfortable Eyes: PERRL, lids and conjunctivae normal ENMT: Mucous membranes are moist. Posterior pharynx clear of any exudate or lesions.Normal dentition.  Neck: normal, supple, no masses, no thyromegaly Respiratory: clear to auscultation bilaterally, no wheezing, no crackles. Normal respiratory effort. No accessory muscle use.  Cardiovascular: Regular rate and rhythm, no murmurs / rubs / gallops. No extremity edema. 2+ pedal pulses. No carotid bruits.  Abdomen: no tenderness, no masses palpated. No hepatosplenomegaly. Bowel sounds positive.  Musculoskeletal: no clubbing / cyanosis. No joint deformity upper and lower extremities. Good ROM, no contractures. Normal muscle tone.  Skin: no rashes, lesions, ulcers. No induration Neurologic: CN 2-12 grossly intact. Sensation intact, DTR normal. Strength 5/5 in all 4.  Psychiatric: Normal judgment and insight. Alert and oriented x 3. Normal mood.    Labs on Admission: I have personally reviewed following labs and imaging studies  CBC:  Recent Labs Lab 07/04/16 0050  WBC 8.3  HGB 13.6  HCT 42.0  MCV 94.4  PLT [id number]   Basic Metabolic Panel:  Recent Labs Lab 07/04/16 0050  NA 136  K 4.2  CL 103  CO2 28  GLUCOSE 130*  BUN 30*    CREATININE 1.98*  CALCIUM 9.0   GFR: CrCl cannot be calculated (Unknown ideal weight.). Liver Function Tests: No results for input(s): AST, ALT, ALKPHOS, BILITOT, PROT, ALBUMIN in the last 168 hours. No results for input(s): LIPASE, AMYLASE in the last 168 hours. No results for input(s): AMMONIA in the last 168 hours. Coagulation Profile: No results for input(s): INR, PROTIME in the last 168 hours. Cardiac Enzymes: No results for input(s): CKTOTAL, CKMB, CKMBINDEX, TROPONINI in the last 168 hours. BNP (last 3 results) No results for input(s): PROBNP in the last 8760 hours. HbA1C: No results for input(s): HGBA1C in the last 72 hours.  CBG: No results for input(s): GLUCAP in the last 168 hours. Lipid Profile: No results for input(s): CHOL, HDL, LDLCALC, TRIG, CHOLHDL, LDLDIRECT in the last 72 hours. Thyroid Function Tests: No results for input(s): TSH, T4TOTAL, FREET4, T3FREE, THYROIDAB in the last 72 hours. Anemia Panel: No results for input(s): VITAMINB12, FOLATE, FERRITIN, TIBC, IRON, RETICCTPCT in the last 72 hours. Urine analysis:    Component Value Date/Time   COLORURINE LT. YELLOW 12/04/2010 1013   APPEARANCEUR CLEAR 12/04/2010 1013   LABSPEC >=1.030 12/04/2010 1013   PHURINE 5.0 12/04/2010 1013   GLUCOSEU NEGATIVE 12/04/2010 1013   HGBUR TRACE-INTACT 12/04/2010 1013   HGBUR moderate 12/18/2009 1009   BILIRUBINUR neg 05/31/2014 1319   KETONESUR NEGATIVE 12/04/2010 1013   PROTEINUR neg 05/31/2014 1319   PROTEINUR NEGATIVE 09/27/2010 0841   UROBILINOGEN 0.2 05/31/2014 1319   UROBILINOGEN 0.2 12/04/2010 1013   NITRITE neg 05/31/2014 1319   NITRITE NEGATIVE 12/04/2010 1013   LEUKOCYTESUR Negative 05/31/2014 1319   Sepsis Labs: @LABRCNTIP (procalcitonin:4,lacticidven:4) )No results found for this or any previous visit (from the past 240 hour(s)).   Radiological Exams on Admission: Dg Chest 2 View  Result Date: 07/04/2016 CLINICAL DATA:  Acute onset of right-sided  chest pain, radiating to the jaw. Initial encounter. EXAM: CHEST  2 VIEW COMPARISON:  Chest radiograph performed 09/11/2015 FINDINGS: The lungs are well-aerated. Mild left basilar opacity likely reflects atelectasis. There is no evidence of pleural effusion or pneumothorax. The heart is normal in size; the mediastinal contour is within normal limits. No acute osseous abnormalities are seen. Cervical spinal fusion hardware is noted. IMPRESSION: Mild left basilar airspace opacity likely reflects atelectasis. Lungs otherwise clear. Electronically Signed   By: Garald Balding M.D.   On: 07/04/2016 01:46    EKG: Independently reviewed.  Assessment/Plan Principal Problem:   Chest pain Active Problems:   Coronary atherosclerosis of native coronary artery    1. Chest pain - 1. Chest pain obs pathway 2. Serial trops 3. Tele monitor 4. Call cards in AM   DVT prophylaxis: Lovenox Code Status: Full Family Communication: Wife at bedside Consults called: None Admission status: Admit to obs   [place], [place] [facility] Pager [phone] from 7PM-7AM  If 7AM-7PM, please contact the day physician for the patient [email or web address] Password [facility]  07/04/2016, 4:39 AM

## 2016-07-04 NOTE — Progress Notes (Signed)
Patient D/C home with wife. Education done. IV removed. Tele monitor removed. CCMD notified. Personal belongings given to patient. Patient D/C home with wife.   Domingo Dimes RN

## 2016-07-04 NOTE — Progress Notes (Signed)
Patient seen and examined. Admitted after midnight secondary to CP. Patient has heart score of 4-5 and a history that even described some atypical components also have typical features. There is concerns for angina equivalent. First troponin is neg. Discussed with Dr. Radford Pax and cardiology will see patient and decide on further work up; I was asked to keep him NPO. Please refer to H&P written by Dr. Alcario Drought for further info/details on admission.  Plan: -follow on telemetry  -cycle troponin -continue PPI BID -continue ASA, NTG, statins and ramipril  -will follow cardiology rec's and clinical response   Barton Dubois [id number]

## 2016-07-04 NOTE — Consult Note (Signed)
Cardiology Consult    Patient ID: Luis Sweeney MRN: [id number], DOB/AGE: [id number]   Admit date: 07/04/2016 Date of Consult: 07/04/2016  Primary Physician: Ria Bush, MD Reason for Consult: Chest Pain Primary Cardiologist: Dr. Burt Knack Requesting Provider: Dr. Dyann Kief   History of Present Illness    Luis Sweeney is a 75 y.o. male with past medical history of CAD (s/p DES to LAD in 2004), HTN, HLD, and Stage 3 CKD who presented to [facility] ED on 07/04/2016 for evaluation of chest pain.   He reports having episodes of right-sided chest pain for the past few weeks. No association with rest or with exertion. Can come on at random and lasts for 15-30 minutes at a time. No associated dyspnea, nausea, vomiting, or diaphoresis. Does radiate to the right side of his neck occasionally. Says the radiation to his neck is similar to the angina he experienced in 2004.  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 Says he develops dyspnea if he "over exerts" himself but says this has not changed in the past several years. Notes a history of duodenal ulcers but says his current pain is different from past GERD-like symptoms. No melena or hematochezia.   Last ischemic evaluation was a NST in 03/2009 which was normal.   While admitted, labs have shown a WBC of 8.3, Hgb 13.6, and platelets 247. Creatinine 1.98. Initial three troponin values have been negative. CXR showed mild left basilar airspace opacity likely reflecting atelectasis. EKG with NSR, HR 77, and no acute ST or T-wave changes.   Past Medical History   Past Medical History:  Diagnosis Date  . Acute duodenal ulcer with hemorrhage but without obstruction    required  bld. transfusion  . Anemia, iron deficiency   . Arthritis    OA- pt. reports its. minor  . CAD (coronary artery disease) 2004   s/p MI treated with drug-eluting stent placement to the proximal left anterior descending in late 2004  .  Diverticulosis of colon   . Duodenal stricture - recurrent 09/21/2014  . 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 GERD (gastroesophageal reflux disease)   . H/O hiatal hernia   . Helicobacter pylori gastritis    recurrent  . Hypercholesteremia   . Hyperglycemia   . Hypertension   . Myocardial infarct ([place])   . NAFLD (nonalcoholic fatty liver disease) 2015   with liver cysts  . Paralysis of upper limb (HCC)    R arm- birth injury  . Personal history of colonic polyps   . Renal insufficiency   . Urticaria    Lat left arm    Past Surgical History:  Procedure Laterality Date  . ANTERIOR CERVICAL DECOMP/DISCECTOMY FUSION  03/2013   C3/4, C4/5 HNP (kritzer)  . ANTERIOR CERVICAL DECOMP/DISCECTOMY FUSION N/A 03/15/2013   Procedure: ANTERIOR CERVICAL DECOMPRESSION/DISCECTOMY FUSION 2 LEVELS;  Surgeon: Faythe Ghee, MD;  Location: [place] [facility];  Service: Neurosurgery;  Laterality: N/A;  Cervical three-four, cervical four-five anterior cervical diskectomy fusion with a trabecular metal plus plate   . CARDIAC CATHETERIZATION  2006   stent placed  . COLONOSCOPY  multiple   diverticulosis, int hem, h/o adenomatous polyps [name])  . COLONOSCOPY  07/2014   severe diverticulosis, no rpt needed [name])  . CORONARY STENT PLACEMENT  2006   drug-eluting  . ESOPHAGOGASTRODUODENOSCOPY  11/26/10   mild gastritis, duodenitis, duodenal  stricture (Dr. [name])  . ESOPHAGOGASTRODUODENOSCOPY  10//2015   duodenal stricture, ?recurrent ulcer [name])  . HERNIA REPAIR Right 2006   Dr. Hassell Done  . KNEE SURGERY     left  . SHOULDER SURGERY     right  . spinal cyst removal    . WRIST SURGERY       Allergies  Allergies  Allergen Reactions  . Codeine Hives and Itching  . Lipitor [Atorvastatin] Other (See Comments)    Reaction:  Muscle pain   . Morphine Hives and Itching  . Simvastatin Other (See Comments)    Reaction:  Muscle pain     Inpatient Medications      . aspirin EC  81 mg Oral Daily  . enoxaparin (LOVENOX) injection  40 mg Subcutaneous Q24H  . nitroGLYCERIN  1 inch Topical Q6H  . pantoprazole  40 mg Oral BID  . pravastatin  40 mg Oral q1800  . ramipril  5 mg Oral BID    Family History    Family History  Problem Relation Age of Onset  . Diabetes Father   . Heart disease Father     CHF  . Kidney disease Father     kidney failure  . Heart failure Father   . Diabetes Sister   . Hypertension Sister   . Cancer Sister     pelvic mass  . Hypertension Sister   . Arthritis Sister   . Ovarian cancer Other     Social History    Social History   Social History  . Marital status: Married    Spouse name: N/A  . Number of children: 3  . Years of education: N/A   Occupational History  . 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 Smoking status: Former Smoker    Packs/day: 1.50    Years: 3.00    Quit date: 02/16/1985  . Smokeless tobacco: Never Used  . Alcohol use 0.0 oz/week     Comment: two to three times a year  . Drug use: No  . Sexual activity: No     Comment: last few years.   Other Topics Concern  . Not on file   Social History Narrative   Daily caffeine use 3 per day   Lives with wife    Activity: no regular exercise   Diet: tries to avoid sweets, some water, fruits/vegetables occasionally     Review of Systems    General:  No chills, fever, night sweats or weight changes.  Cardiovascular:  No dyspnea on exertion, edema, orthopnea, palpitations, paroxysmal nocturnal dyspnea. Positive for chest pain.  Dermatological: No rash, lesions/masses Respiratory: No cough, dyspnea Urologic: No hematuria, dysuria Abdominal:   No nausea, vomiting, diarrhea, bright red blood per rectum, melena, or hematemesis Neurologic:  No visual changes, wkns, changes in mental status. All other systems reviewed and are otherwise negative except as noted  above.  Physical Exam    Blood pressure (!) 128/57, pulse 83, temperature 97.8 F (36.6 C), temperature source Oral, resp. rate 18, height 6\' 2"  (1.88 m), weight 255 lb 4.8 oz (115.8 kg), SpO2 96 %.  General: Pleasant, elderly Caucasian male appearing in NAD. Psych: Normal affect. Neuro: Alert and oriented X 3. Moves all extremities spontaneously. HEENT: Normal  Neck: Supple without bruits or JVD. Lungs:  Resp regular and unlabored, CTA without wheezing or rales. Heart: RRR no s3, s4, or murmurs. Abdomen: Soft, non-tender,  non-distended, BS + x 4.  Extremities: No clubbing, cyanosis or edema. Deformed RUE  Labs    Troponin [facility] of Care Test)  Recent Labs  07/04/16 0057  TROPIPOC 0.00    Recent Labs  07/04/16 0534 07/04/16 0852  TROPONINI <0.03 <0.03   Lab Results  Component Value Date   WBC 8.3 07/04/2016   HGB 13.6 07/04/2016   HCT 42.0 07/04/2016   MCV 94.4 07/04/2016   PLT 247 07/04/2016    Recent Labs Lab 07/04/16 0050  NA 136  K 4.2  CL 103  CO2 28  BUN 30*  CREATININE 1.98*  CALCIUM 9.0  GLUCOSE 130*   Lab Results  Component Value Date   CHOL 124 02/20/2016   HDL 28.20 (L) 02/20/2016   LDLCALC 78 02/20/2016   TRIG 89.0 02/20/2016   No results found for: [facility]   Radiology Studies    Dg Chest 2 View  Result Date: 07/04/2016 CLINICAL DATA:  Acute onset of right-sided chest pain, radiating to the jaw. Initial encounter. EXAM: CHEST  2 VIEW COMPARISON:  Chest radiograph performed 09/11/2015 FINDINGS: The lungs are well-aerated. Mild left basilar opacity likely reflects atelectasis. There is no evidence of pleural effusion or pneumothorax. The heart is normal in size; the mediastinal contour is within normal limits. No acute osseous abnormalities are seen. Cervical spinal fusion hardware is noted. IMPRESSION: Mild left basilar airspace opacity likely reflects atelectasis. Lungs otherwise clear. Electronically Signed   By: Garald Balding M.D.   On:  07/04/2016 01:46    EKG & Cardiac Imaging    EKG: NSR, HR 77, and no acute ST or T-wave changes  Cardiac Catheterization: 08/2003 FINDINGS:  1. LV 141/16/28.  EF 45% with apical dyskinesis and anterolateral     hypokinesis.  There was also hypokinesis of the distal portion of the     inferior wall.  2. Left main:  Angiographically normal.  3. LAD:  Large vessel wrapping the apex of the heart and giving rise to a     small diagonal.  There is a 90% stenosis of the proximal vessel covering     two small septal perforators.  This was stented with excellent     angiographic result as described in detail above.  4. Circumflex:  Large, codominant vessel giving rise to two obtuse marginals     and the PDA.  The second obtuse marginal has a 30% ostial stenosis.  5. RCA:  Small, codominant vessel.  It is angiographically normal.  6. Abdominal aortography:  Normal appearing abdominal aorta.  There are     single renal arteries bilaterally.  Both are normal.   IMPRESSION/RECOMMENDATIONS:  Successful stenting of culprit lesion of  proximal left anterior descending with excellent angiographic result.  The  patient will be continued on Plavix for one year and aspirin indefinitely.  ACE inhibitor will be added to his current beta blocker and Statin.  Eptifibatide will be continued for 18 hours.  Sheath will be discontinued  when the ACT is less than 150 seconds.   Assessment & Plan    1. Chest Pain with known history of CAD - s/p DES to LAD in 2004. Reports episodes of chest pain for the past several weeks, no association with exertion. Lasts for 15-30 minutes at a time. No associated dyspnea, nausea, vomiting, or diaphoresis. Does radiate to the right side of his neck occasionally, which is  similar to the angina he experienced in 2004. - last ischemic evaluation was  a NST in 03/2009 which was normal. Patient states he does not want to undergo a NST due to how bad the previous test made him  feel. Reviewed Lexiscan vs. likely previous dobutamine agents.  - cyclic troponin values have been negative. EKG shows NSR, HR 77, and no acute ST or T-wave changes.  - a cardiac catheterization would be the test of choice for this patient with known CAD and his symptoms resembling previous episodes of unstable angina. He does not wish to remain hospitalized until Monday for a cath. Will discuss with Dr. Haroldine Laws about obtaining outpatient cath. Would require hydration in the setting of his Stage 3 CKD. - continue ASA and statin. Will start low-dose BB and Imdur.   2. HTN - well-controlled. Continue current medication regimen.   3. HLD - LDL 78 in 02/2016 - intolerant to Atorvastatin in the past. Continue Pravastatin.   4. Stage 3 CKD - creatinine 1.98 on admission, similar to previous values over the past year.   Signed, Erma Heritage, PA-C 07/04/2016, 3:00 PM Pager: [phone]  Patient seen and examined with Bernerd Pho, PA-C. We discussed all aspects of the encounter. I agree with the assessment and plan as stated above.   CP is concerning for progressive angina. Fortunately trop and ecg are normal. He is eager to go home. No further CP in last 24 hours.  I feel that he needs cath and told him this. He and his family agreed. We discussed the options of staying in house for cath Monday or coming back middle of the week for cath with overnight pre-hydration. He is adamant that he prefers the latter. We discussed the small risk of having an acute MI with this approach. He understands and is willing to accept this risk. Will start carvedilol 3.125 bid and Imdur 30. Can go home today and will readmit Tues night for prehydration with cath on Wednesday with Dr. Burt Knack. Knows to call 911 if CP returns and is persistent. D/W Dr. Dyann Kief.   Osmar Howton,MD 4:27 PM

## 2016-07-04 NOTE — ED Triage Notes (Signed)
Patient arrives with complaint of right/central chest pain with radiation to right Jaw. States onset of intermittent but much milder pain a couple weeks ago. Tonight as patient getting ready for bed the pain returned and was very intense 8/10. Wife gave patient a NTG SL 0.4 tab and patient noted improvement of pain. Currently 4/10. Hx MI 12 years ago with Stent placement. Last cardiologist contact 1 year ago.

## 2016-07-04 NOTE — ED Provider Notes (Signed)
[facility] DEPT Provider Note   CSN: [id number] Arrival date & time: 07/04/16  0037  By signing my name below, I, Irene Pap, attest that this documentation has been prepared under the direction and in the presence of Merryl Hacker, MD. Electronically Signed: Irene Pap, ED Scribe. 07/04/16. 2:42 AM.  History   Chief Complaint Chief Complaint  Patient presents with  . Chest Pain   The history is provided by the patient. No language interpreter was used.   HPI Comments: Luis Sweeney is a 75 y.o. male with a hx of acute duodenal ulcer, CAD, HTN, MI, DM, stent placement, cardiac catheterization, and CKD who presents to the Emergency Department complaining of "nagging" right chest pain that radiates to the right jaw onset 3 weeks ago, significantly worsening 3 hours ago. Pt says that until tonight, his pain was much milder and intermittent. He reports associated diaphoresis. Pt's pain worsened to 9/10 tonight. His wife gave him NTG SL 0.4 tab and pain improved to 4/10. He says that his pain tonight feels somewhat similar to his past MI. Pt's cardiologist is Dr. Burt Knack. He has not seen him recently and did not contact him when the pain started. Pt does not regularly take Nitro, but takes aspirin daily. He denies worsening pain with movement, nausea, vomiting, diarrhea, abdominal pain, or SOB. Patient does report history of peptic ulcers. He just started taking his Protonix again yesterday.   Past Medical History:  Diagnosis Date  . Acute duodenal ulcer with hemorrhage but without obstruction    required  bld. transfusion  . Anemia, iron deficiency   . Arthritis    OA- pt. reports its. minor  . CAD (coronary artery disease) 2004   s/p MI treated with drug-eluting stent placement to the proximal left anterior descending in late 2004  . Diverticulosis of colon   . Duodenal stricture - recurrent 09/21/2014  . Erb's palsy    birth trauma, pt. reports he was 11lbs. + at birth     . GERD (gastroesophageal reflux disease)   . H/O hiatal hernia   . Helicobacter pylori gastritis    recurrent  . Hypercholesteremia   . Hyperglycemia   . Hypertension   . Myocardial infarct ([place])   . NAFLD (nonalcoholic fatty liver disease) 2015   with liver cysts  . Paralysis of upper limb (HCC)    R arm- birth injury  . Personal history of colonic polyps   . Renal insufficiency   . Urticaria    Lat left arm    Patient Active Problem List   Diagnosis Date Noted  . Health maintenance examination 02/25/2016  . Diet-controlled diabetes mellitus ([place]) 02/25/2016  . Irregular heart beat 02/25/2016  . Pain behind the ear 01/02/2016  . Advanced care planning/counseling discussion 02/20/2015  . Obesity, Class I, BMI 30-34.9 02/20/2015  . Duodenal stricture - recurrent 09/21/2014  . Hx of peptic ulcer 06/11/2014  . NAFLD (nonalcoholic fatty liver disease)   . Medicare annual wellness visit, subsequent 05/31/2012  . B12 deficiency 05/31/2012  . Right inguinal hernia 05/31/2012  . Coronary atherosclerosis of native coronary artery 03/13/2009  . CKD (chronic kidney disease), stage III 03/13/2009  . HYPERCHOLESTEROLEMIA 01/04/2008  . History of colonic polyps 11/07/2007  . PERSONAL HISTORY OF PEPTIC ULCER DISEASE 11/07/2007  . GERD 03/08/2007  . MYOCARDIAL INFARCTION, HX OF 03/07/2007  . ERB'S PALSY 03/07/2007    Past Surgical History:  Procedure Laterality Date  . ANTERIOR CERVICAL DECOMP/DISCECTOMY FUSION  03/2013  C3/4, C4/5 HNP (kritzer)  . ANTERIOR CERVICAL DECOMP/DISCECTOMY FUSION N/A 03/15/2013   Procedure: ANTERIOR CERVICAL DECOMPRESSION/DISCECTOMY FUSION 2 LEVELS;  Surgeon: Faythe Ghee, MD;  Location: [facility];  Service: Neurosurgery;  Laterality: N/A;  Cervical three-four, cervical four-five anterior cervical diskectomy fusion with a trabecular metal plus plate   . CARDIAC CATHETERIZATION  2006   stent placed  . COLONOSCOPY  multiple   diverticulosis, int hem,  h/o adenomatous polyps [name])  . COLONOSCOPY  07/2014   severe diverticulosis, no rpt needed [name])  . CORONARY STENT PLACEMENT  2006   drug-eluting  . ESOPHAGOGASTRODUODENOSCOPY  11/26/10   mild gastritis, duodenitis, duodenal stricture (Dr. [name])  . ESOPHAGOGASTRODUODENOSCOPY  10//2015   duodenal stricture, ?recurrent ulcer [name])  . HERNIA REPAIR Right 2006   Dr. Hassell Done  . KNEE SURGERY     left  . SHOULDER SURGERY     right  . spinal cyst removal    . WRIST SURGERY       Home Medications    Prior to Admission medications   Medication Sig Start Date End Date Taking? Authorizing Provider  aspirin EC 81 MG tablet Take 81 mg by mouth daily.   Yes Historical Provider, MD  Cholecalciferol (VITAMIN D3) 5000 UNITS TABS Take 5,000 Units by mouth daily.   Yes Historical Provider, MD  lovastatin (MEVACOR) 40 MG tablet TAKE ONE TABLET AT BEDTIME 05/25/16  Yes Ria Bush, MD  pantoprazole (PROTONIX) 40 MG tablet TAKE 1 TABLET BY MOUTH DAILY BEFORE BREAKFAST 07/02/16  Yes Gatha Mayer, MD  ramipril (ALTACE) 5 MG capsule TAKE 1 CAPSULE (5 MG TOTAL) BY MOUTH 2 (TWO) TIMES DAILY. 10/16/15  Yes Sherren Mocha, MD  vitamin B-12 (CYANOCOBALAMIN) 1000 MCG tablet Take 1,000 mcg by mouth daily.   Yes Historical Provider, MD    Family History Family History  Problem Relation Age of Onset  . Diabetes Father   . Heart disease Father     CHF  . Kidney disease Father     kidney failure  . Heart failure Father   . Diabetes Sister   . Hypertension Sister   . Cancer Sister     pelvic mass  . Hypertension Sister   . Arthritis Sister   . Ovarian cancer Other     Social History Social History  Substance Use Topics  . Smoking status: Former Smoker    Packs/day: 1.50    Years: 3.00    Quit date: 02/16/1985  . Smokeless tobacco: Never Used  . Alcohol use 0.0 oz/week     Comment: two to three times a year     Allergies   Codeine; Lipitor [atorvastatin]; Morphine; and  Simvastatin   Review of Systems Review of Systems  Constitutional: Positive for diaphoresis.  Respiratory: Negative for shortness of breath.   Cardiovascular: Positive for chest pain.  Gastrointestinal: Negative for abdominal pain, diarrhea, nausea and vomiting.  All other systems reviewed and are negative.    Physical Exam Updated Vital Signs BP 134/63   Pulse 70   Temp 98 F (36.7 C) (Oral)   Resp 13   SpO2 98%   Physical Exam  Constitutional: He is oriented to person, place, and time. He appears well-developed and well-nourished.  Overweight  HENT:  Head: Normocephalic and atraumatic.  Cardiovascular: Normal rate, regular rhythm and normal heart sounds.   No murmur heard. Pulmonary/Chest: Effort normal and breath sounds normal. No respiratory distress. He has no wheezes. He exhibits no tenderness.  Abdominal:  Soft. Bowel sounds are normal. There is no tenderness. There is no rebound.  Musculoskeletal: He exhibits no edema.  Neurological: He is alert and oriented to person, place, and time.  Skin: Skin is warm and dry.  Psychiatric: He has a normal mood and affect.  Nursing note and vitals reviewed.    ED Treatments / Results  DIAGNOSTIC STUDIES: Oxygen Saturation is 96% on RA, normal by my interpretation.    COORDINATION OF CARE: 1:41 AM-Discussed treatment plan which includes labs and EKG with pt at bedside and pt agreed to plan.    Labs (all labs ordered are listed, but only abnormal results are displayed) Labs Reviewed  BASIC METABOLIC PANEL - Abnormal; Notable for the following:       Result Value   Glucose, Bld 130 (*)    BUN 30 (*)    Creatinine, Ser 1.98 (*)    GFR calc non Af Amer 31 (*)    GFR calc Af Amer 36 (*)    All other components within normal limits  CBC  I-STAT TROPOININ, ED    EKG  EKG Interpretation  Date/Time:  Saturday July 04 2016 00:41:01 EDT Ventricular Rate:  77 PR Interval:  168 QRS Duration: 86 QT  Interval:  380 QTC Calculation: 430 R Axis:   35 Text Interpretation:  Normal sinus rhythm Normal ECG Confirmed by Niam Nepomuceno  MD, [place] Sousa ([id number]) on 07/04/2016 1:35:17 AM       Radiology Dg Chest 2 View  Result Date: 07/04/2016 CLINICAL DATA:  Acute onset of right-sided chest pain, radiating to the jaw. Initial encounter. EXAM: CHEST  2 VIEW COMPARISON:  Chest radiograph performed 09/11/2015 FINDINGS: The lungs are well-aerated. Mild left basilar opacity likely reflects atelectasis. There is no evidence of pleural effusion or pneumothorax. The heart is normal in size; the mediastinal contour is within normal limits. No acute osseous abnormalities are seen. Cervical spinal fusion hardware is noted. IMPRESSION: Mild left basilar airspace opacity likely reflects atelectasis. Lungs otherwise clear. Electronically Signed   By: Garald Balding M.D.   On: 07/04/2016 01:46    Procedures Procedures (including critical care time)  Medications Ordered in ED Medications  aspirin chewable tablet 324 mg (324 mg Oral Given 07/04/16 0216)  nitroGLYCERIN (NITROGLYN) 2 % ointment 1 inch (1 inch Topical Given 07/04/16 0223)     Initial Impression / Assessment and Plan / ED Course  I have reviewed the triage vital signs and the nursing notes.  Pertinent labs & imaging results that were available during my care of the patient were reviewed by me and considered in my medical decision making (see chart for details).  Clinical Course    Patient presents with chest pain. Intermittent but worsened tonight. It did get better with nitroglycerin and he did have diaphoresis. He has a history of coronary artery disease. Also history of peptic ulcers. Heart score 5. Followed by Dr. Burt Knack. He continues to have 4 out of 10 pain. EKG is nonischemic. Chest x-ray is reassuring. Patient was given full dose aspirin and nitroglycerin paste was applied. Initial troponin is negative.  3:50 AM On recheck, pain is now 0.  While  story is somewhat atypical, given his risk factors and comparison of pain to prior amounts, will admit for chest pain rule out. GI etiology is also consideration.   Discussed with Dr. Alcario Drought.  After history, exam, and medical workup I feel the patient has been appropriately medically screened and is safe for discharge home. Pertinent diagnoses were  discussed with the patient. Patient was given return precautions.   Final Clinical Impressions(s) / ED Diagnoses   Final diagnoses:  Chest pain, unspecified chest pain type   I personally performed the services described in this documentation, which was scribed in my presence. The recorded information has been reviewed and is accurate.   New Prescriptions New Prescriptions   No medications on file     Merryl Hacker, MD 07/04/16 [phone]

## 2016-07-06 ENCOUNTER — Telehealth: Payer: Self-pay

## 2016-07-06 NOTE — Telephone Encounter (Signed)
Transition Care Management Follow-up Telephone Call    Date discharged? 07/04/2016  How have you been since you were released from the hospital? [place].   Any patient concerns? Complaint of headache. Reviewed side effects of Imdur and Coreg. Advised Imdur does have s/e of headache. Encouraged pt to contact cardiologist about this concern as he was prescribing physician.  Pt has been taking Tylenol for pain mgmt and it has been effective.    Items Reviewed:  Medications reviewed: Yes  Allergies reviewed: Yes  Dietary changes reviewed: Yes, pt reports dietary changes have been challenging  Referrals reviewed: Yes, pt has cath procedure on 9/27.    Functional Questionnaire:  Independent - I Dependent - D    Activities of Daily Living (ADLs):    Personal hygiene - I Dressing - I Eating - I Maintaining continence - I Transferring - I   Independent Activities of Daily Living (iADLs): Basic communication skills - I Transportation - I Meal preparation  - I Shopping - I Housework - I  Managing medications - I  Managing personal finances - I   Confirmed importance and date/time of follow-up visits scheduled YES  Provider Appointment booked with PCP 07/16/2016 @ 0900  Confirmed with patient if condition begins to worsen call PCP or go to the ER.  Patient was given the office number and encouraged to call back with question or concerns: YES

## 2016-07-07 ENCOUNTER — Observation Stay (HOSPITAL_COMMUNITY)
Admission: AD | Admit: 2016-07-07 | Discharge: 2016-07-08 | Disposition: A | Discharge status: Home / Self Care | Payer: PPO | Source: Ambulatory Visit | Attending: Cardiology | Admitting: Cardiology

## 2016-07-07 ENCOUNTER — Encounter (HOSPITAL_COMMUNITY): Payer: Self-pay

## 2016-07-07 DIAGNOSIS — N183 Chronic kidney disease, stage 3 unspecified: Secondary | ICD-10-CM | POA: Diagnosis present

## 2016-07-07 DIAGNOSIS — Z79899 Other long term (current) drug therapy: Secondary | ICD-10-CM | POA: Insufficient documentation

## 2016-07-07 DIAGNOSIS — Z8711 Personal history of peptic ulcer disease: Secondary | ICD-10-CM

## 2016-07-07 DIAGNOSIS — E669 Obesity, unspecified: Secondary | ICD-10-CM | POA: Diagnosis not present

## 2016-07-07 DIAGNOSIS — I252 Old myocardial infarction: Secondary | ICD-10-CM | POA: Insufficient documentation

## 2016-07-07 DIAGNOSIS — E1121 Type 2 diabetes mellitus with diabetic nephropathy: Secondary | ICD-10-CM

## 2016-07-07 DIAGNOSIS — Z87891 Personal history of nicotine dependence: Secondary | ICD-10-CM | POA: Diagnosis not present

## 2016-07-07 DIAGNOSIS — R079 Chest pain, unspecified: Secondary | ICD-10-CM | POA: Diagnosis present

## 2016-07-07 DIAGNOSIS — K219 Gastro-esophageal reflux disease without esophagitis: Secondary | ICD-10-CM | POA: Diagnosis present

## 2016-07-07 DIAGNOSIS — I25118 Atherosclerotic heart disease of native coronary artery with other forms of angina pectoris: Secondary | ICD-10-CM | POA: Diagnosis present

## 2016-07-07 DIAGNOSIS — I200 Unstable angina: Secondary | ICD-10-CM

## 2016-07-07 DIAGNOSIS — M1990 Unspecified osteoarthritis, unspecified site: Secondary | ICD-10-CM | POA: Insufficient documentation

## 2016-07-07 DIAGNOSIS — Z7982 Long term (current) use of aspirin: Secondary | ICD-10-CM | POA: Insufficient documentation

## 2016-07-07 DIAGNOSIS — E1122 Type 2 diabetes mellitus with diabetic chronic kidney disease: Secondary | ICD-10-CM | POA: Diagnosis present

## 2016-07-07 DIAGNOSIS — N1832 Chronic kidney disease, stage 3b: Secondary | ICD-10-CM | POA: Diagnosis present

## 2016-07-07 DIAGNOSIS — Z955 Presence of coronary angioplasty implant and graft: Secondary | ICD-10-CM | POA: Insufficient documentation

## 2016-07-07 DIAGNOSIS — E7800 Pure hypercholesterolemia, unspecified: Secondary | ICD-10-CM | POA: Diagnosis not present

## 2016-07-07 DIAGNOSIS — Z6830 Body mass index (BMI) 30.0-30.9, adult: Secondary | ICD-10-CM | POA: Diagnosis not present

## 2016-07-07 DIAGNOSIS — E785 Hyperlipidemia, unspecified: Secondary | ICD-10-CM | POA: Diagnosis present

## 2016-07-07 DIAGNOSIS — I2510 Atherosclerotic heart disease of native coronary artery without angina pectoris: Secondary | ICD-10-CM | POA: Insufficient documentation

## 2016-07-07 DIAGNOSIS — I129 Hypertensive chronic kidney disease with stage 1 through stage 4 chronic kidney disease, or unspecified chronic kidney disease: Secondary | ICD-10-CM | POA: Diagnosis not present

## 2016-07-07 DIAGNOSIS — R072 Precordial pain: Principal | ICD-10-CM | POA: Insufficient documentation

## 2016-07-07 DIAGNOSIS — E119 Type 2 diabetes mellitus without complications: Secondary | ICD-10-CM | POA: Diagnosis not present

## 2016-07-07 DIAGNOSIS — E118 Type 2 diabetes mellitus with unspecified complications: Secondary | ICD-10-CM

## 2016-07-07 DIAGNOSIS — E1169 Type 2 diabetes mellitus with other specified complication: Secondary | ICD-10-CM | POA: Diagnosis present

## 2016-07-07 HISTORY — DX: Chronic kidney disease, stage 3 (moderate): N18.3

## 2016-07-07 HISTORY — DX: Chronic kidney disease, stage 3 unspecified: N18.30

## 2016-07-07 LAB — CREATININE, SERUM
Creatinine, Ser: 1.79 mg/dL — ABNORMAL HIGH (ref 0.61–1.24)
GFR calc Af Amer: 41 mL/min — ABNORMAL LOW (ref >60)
GFR calc non Af Amer: 35 mL/min — ABNORMAL LOW (ref >60)

## 2016-07-07 LAB — CBC
HCT: 41.2 % (ref 39.0–52.0)
Hemoglobin: 13.3 g/dL (ref 13.0–17.0)
MCH: 30.2 pg (ref 26.0–34.0)
MCHC: 32.3 g/dL (ref 30.0–36.0)
MCV: 93.6 fL (ref 78.0–100.0)
Platelets: 234 10*3/uL (ref 150–400)
RBC: 4.4 MIL/uL (ref 4.22–5.81)
RDW: 13.9 % (ref 11.5–15.5)
WBC: 7.5 10*3/uL (ref 4.0–10.5)

## 2016-07-07 MED ORDER — SODIUM CHLORIDE 0.9% FLUSH
3.0000 mL | Freq: Two times a day (BID) | INTRAVENOUS | Status: DC
  Administered 2016-07-07 – 2016-07-08 (×2): 3 mL via INTRAVENOUS

## 2016-07-07 MED ORDER — SODIUM CHLORIDE 0.9 % IV SOLN: 250.0000 mL | INTRAVENOUS | Status: DC | PRN

## 2016-07-07 MED ORDER — ACETAMINOPHEN 325 MG PO TABS: 650.0000 mg | ORAL_TABLET | ORAL | Status: DC | PRN

## 2016-07-07 MED ORDER — CARVEDILOL 3.125 MG PO TABS
3.1250 mg | ORAL_TABLET | Freq: Two times a day (BID) | ORAL | Status: DC
  Administered 2016-07-08: 3.125 mg via ORAL
  Filled 2016-07-07 (×2): qty 1

## 2016-07-07 MED ORDER — ASPIRIN EC 81 MG PO TBEC: 81.0000 mg | DELAYED_RELEASE_TABLET | Freq: Every day | ORAL | Status: DC

## 2016-07-07 MED ORDER — VITAMIN D 1000 UNITS PO TABS
5000.0000 [IU] | ORAL_TABLET | Freq: Every day | ORAL | Status: DC
  Administered 2016-07-08: 5000 [IU] via ORAL
  Filled 2016-07-07: qty 5

## 2016-07-07 MED ORDER — RAMIPRIL 5 MG PO CAPS
5.0000 mg | ORAL_CAPSULE | Freq: Two times a day (BID) | ORAL | Status: DC
  Administered 2016-07-07 – 2016-07-08 (×2): 5 mg via ORAL
  Filled 2016-07-07 (×2): qty 1

## 2016-07-07 MED ORDER — ASPIRIN 81 MG PO CHEW
81.0000 mg | CHEWABLE_TABLET | ORAL | Status: AC
  Administered 2016-07-08: 81 mg via ORAL
  Filled 2016-07-07: qty 1

## 2016-07-07 MED ORDER — HEPARIN SODIUM (PORCINE) 5000 UNIT/ML IJ SOLN
5000.0000 [IU] | Freq: Three times a day (TID) | INTRAMUSCULAR | Status: DC
Start: 2016-07-07 — End: 2016-07-09
  Administered 2016-07-07 – 2016-07-08 (×3): 5000 [IU] via SUBCUTANEOUS
  Filled 2016-07-07 (×4): qty 1

## 2016-07-07 MED ORDER — PRAVASTATIN SODIUM 40 MG PO TABS
40.0000 mg | ORAL_TABLET | Freq: Every day | ORAL | Status: DC
  Administered 2016-07-07: 40 mg via ORAL
  Filled 2016-07-07 (×2): qty 1

## 2016-07-07 MED ORDER — NITROGLYCERIN 0.4 MG SL SUBL: 0.4000 mg | SUBLINGUAL_TABLET | SUBLINGUAL | Status: DC | PRN

## 2016-07-07 MED ORDER — VITAMIN B-12 1000 MCG PO TABS
1000.0000 ug | ORAL_TABLET | Freq: Every day | ORAL | Status: DC
  Administered 2016-07-08: 1000 ug via ORAL
  Filled 2016-07-07: qty 1

## 2016-07-07 MED ORDER — ISOSORBIDE MONONITRATE ER 30 MG PO TB24
30.0000 mg | ORAL_TABLET | Freq: Every day | ORAL | Status: DC
  Administered 2016-07-08: 30 mg via ORAL
  Filled 2016-07-07: qty 1

## 2016-07-07 MED ORDER — SODIUM CHLORIDE 0.9 % WEIGHT BASED INFUSION
1.0000 mL/kg/h | INTRAVENOUS | Status: DC
  Administered 2016-07-07 – 2016-07-08 (×3): 1 mL/kg/h via INTRAVENOUS

## 2016-07-07 MED ORDER — ONDANSETRON HCL 4 MG/2ML IJ SOLN: 4.0000 mg | Freq: Four times a day (QID) | INTRAMUSCULAR | Status: DC | PRN

## 2016-07-07 MED ORDER — SODIUM CHLORIDE 0.9% FLUSH: 3.0000 mL | INTRAVENOUS | Status: DC | PRN

## 2016-07-07 MED ORDER — PANTOPRAZOLE SODIUM 40 MG PO TBEC
40.0000 mg | DELAYED_RELEASE_TABLET | Freq: Two times a day (BID) | ORAL | Status: DC
  Administered 2016-07-08: 40 mg via ORAL
  Filled 2016-07-07: qty 1

## 2016-07-07 NOTE — Progress Notes (Signed)
Patient arrived via wheelchair from home. Assessment completed. Placed on tele, CCMD notified. Patient oriented to room and staff. Bed in lowest position and call light within reach. Will continue to monitor.

## 2016-07-07 NOTE — H&P (Signed)
Patient ID: KHALIQ BURGGRAF MRN: [id number], DOB/AGE: 1941/02/23   Admit date: 07/07/2016   Primary Physician: Ria Bush, MD Primary Cardiologist: Burt Knack  Pt. Profile:  Luis Sweeney is a 75 y.o. male with a history of CAD (s/p DES to LAD in 2004), HTN, HLD, and Stage 3 CKD who presented to [facility]  for pre hydration prior to cath.   HPI: Admitted 07/04/16 for episodes of right-sided chest pain for the past few weeks. No association with rest or with exertion. Can come on at random and lasts for 15-30 minutes at a time. No associated dyspnea, nausea, vomiting, or diaphoresis. Does radiate to the right side of his neck occasionally. Troponin was negative. EKG without acute changes. Seen by Dr. Haroldine Laws and started on carvedilol 3.125 bid and Imdur 30. He was discharged home same day with plan to cath 07/08/16. Scr was 1.8 at that time.   Here for pre-hydration. Started that Imdur caused him to have a headache. This morning he work up with chest tightness that last longer than usual for about 45 mins and resolved by it self. Currently chest pain free.    EKG 07/04/16 with NSR, HR 77, and no acute ST or T-wave changes.    Problem List  Past Medical History:  Diagnosis Date  . Acute duodenal ulcer with hemorrhage but without obstruction    required  bld. transfusion  . Anemia, iron deficiency   . Arthritis    OA- pt. reports its. minor  . CAD (coronary artery disease) 2004   s/p MI treated with drug-eluting stent placement to the proximal left anterior descending in late 2004  . Diverticulosis of colon   . Duodenal stricture - recurrent 09/21/2014  . 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 GERD (gastroesophageal reflux disease)   . H/O hiatal hernia   . Helicobacter pylori gastritis    recurrent  . Hypercholesteremia   . Hyperglycemia   . Hypertension   . Myocardial infarct ([place])   . NAFLD (nonalcoholic fatty liver disease) 2015   with liver  cysts  . Paralysis of upper limb ([facility])    R arm- birth injury  . Personal history of colonic polyps   . Renal insufficiency   . Urticaria    Lat left arm    Past Surgical History:  Procedure Laterality Date  . ANTERIOR CERVICAL DECOMP/DISCECTOMY FUSION  03/2013   C3/4, C4/5 HNP (kritzer)  . ANTERIOR CERVICAL DECOMP/DISCECTOMY FUSION N/A 03/15/2013   Procedure: ANTERIOR CERVICAL DECOMPRESSION/DISCECTOMY FUSION 2 LEVELS;  Surgeon: Faythe Ghee, MD;  Location: [facility];  Service: Neurosurgery;  Laterality: N/A;  Cervical three-four, cervical four-five anterior cervical diskectomy fusion with a trabecular metal plus plate   . CARDIAC CATHETERIZATION  2006   stent placed  . COLONOSCOPY  multiple   diverticulosis, int hem, h/o adenomatous polyps [name])  . COLONOSCOPY  07/2014   severe diverticulosis, no rpt needed [name])  . CORONARY STENT PLACEMENT  2006   drug-eluting  . ESOPHAGOGASTRODUODENOSCOPY  11/26/10   mild gastritis, duodenitis, duodenal stricture (Dr. [name])  . ESOPHAGOGASTRODUODENOSCOPY  10//2015   duodenal stricture, ?recurrent ulcer [name])  . HERNIA REPAIR Right 2006   Dr. Hassell Done  . KNEE SURGERY     left  . SHOULDER SURGERY     right  . spinal cyst removal    . WRIST SURGERY       Allergies  Allergies  Allergen Reactions  . Codeine Hives and Itching  . Lipitor [Atorvastatin] Other (See Comments)    Reaction:  Muscle pain   . Morphine Hives and Itching  . Simvastatin Other (See Comments)    Reaction:  Muscle pain      Home Medications  Prior to Admission medications   Medication Sig Start Date End Date Taking? Authorizing Provider  aspirin EC 81 MG tablet Take 81 mg by mouth daily.   Yes Historical Provider, MD  carvedilol (COREG) 3.125 MG tablet Take 1 tablet (3.125 mg total) by mouth 2 (two) times daily with a meal. 07/04/16  Yes Barton Dubois, MD  Cholecalciferol (VITAMIN D3) 5000 UNITS TABS Take 5,000 Units by mouth daily.   Yes  Historical Provider, MD  diphenhydramine-acetaminophen (TYLENOL PM) 25-500 MG TABS tablet Take 2 tablets by mouth at bedtime as needed (sleep).   Yes Historical Provider, MD  isosorbide mononitrate (IMDUR) 30 MG 24 hr tablet Take 1 tablet (30 mg total) by mouth daily. 07/04/16  Yes Barton Dubois, MD  lovastatin (MEVACOR) 40 MG tablet TAKE ONE TABLET AT BEDTIME 05/25/16  Yes Ria Bush, MD  pantoprazole (PROTONIX) 40 MG tablet Take 1 tablet (40 mg total) by mouth 2 (two) times daily. Patient taking differently: Take 40 mg by mouth daily.  07/04/16  Yes Barton Dubois, MD  ramipril (ALTACE) 5 MG capsule TAKE 1 CAPSULE (5 MG TOTAL) BY MOUTH 2 (TWO) TIMES DAILY. 10/16/15  Yes Sherren Mocha, MD  vitamin B-12 (CYANOCOBALAMIN) 1000 MCG tablet Take 1,000 mcg by mouth daily.   Yes Historical Provider, MD    Family History  Family History  Problem Relation Age of Onset  . Diabetes Father   . Heart disease Father     CHF  . Kidney disease Father     kidney failure  . Heart failure Father   . Diabetes Sister   . Hypertension Sister   . Cancer Sister     pelvic mass  . Hypertension Sister   . Arthritis Sister   . Ovarian cancer Other    Family Status  Relation Status  . Father Deceased at age 44  . Sister Alive  . Sister Alive  . Other Alive  . Mother Deceased at age 30   pneumonia  . Sister Alive  . Sister Alive  . Other      Social History  Social History   Social History  . Marital status: Married    Spouse name: N/A  . Number of children: 3  . Years of education: N/A   Occupational History  . [employer] [place]    [employer]/Stairs[employer]   Social History Main Topics  . Smoking status: Former Smoker    Packs/day: 1.50    Years: 3.00    Quit date: 02/16/1985  . Smokeless tobacco: Never Used  . Alcohol use 0.0 oz/week     Comment: two to three times a year  . Drug use: No  . Sexual activity: No     Comment: last few years.    Other Topics Concern  . Not on file   Social History Narrative   Daily caffeine use 3 per day   Lives with wife    Activity: no regular exercise   Diet: tries to avoid sweets, some water, fruits/vegetables occasionally      All other systems reviewed and are otherwise negative except as noted above.  Physical Exam  Blood pressure (!) 118/57, pulse 75,  temperature 97.9 F (36.6 C), temperature source Oral, resp. rate 18, SpO2 96 %.  General: Pleasant, NAD Psych: Normal affect. Neuro: Alert and oriented X 3. Moves all extremities spontaneously. HEENT: Normal  Neck: Supple without bruits or JVD. Lungs:  Resp regular and unlabored, CTA. Heart: RRR no s3, s4, or murmurs. Abdomen: Soft, non-tender, non-distended, BS + x 4.  Extremities: No clubbing, cyanosis. Trace BL LE edema. DP/PT/Radials 2+ and equal bilaterally.  Labs  No results for input(s): CKTOTAL, CKMB, TROPONINI in the last 72 hours. Lab Results  Component Value Date   WBC 8.3 07/04/2016   HGB 13.6 07/04/2016   HCT 42.0 07/04/2016   MCV 94.4 07/04/2016   PLT 247 07/04/2016    Recent Labs Lab 07/04/16 0050  NA 136  K 4.2  CL 103  CO2 28  BUN 30*  CREATININE 1.98*  CALCIUM 9.0  GLUCOSE 130*   Lab Results  Component Value Date   CHOL 124 02/20/2016   HDL 28.20 (L) 02/20/2016   LDLCALC 78 02/20/2016   TRIG 89.0 02/20/2016   No results found for: DDIMER   Radiology/Studies  Dg Chest 2 View  Result Date: 07/04/2016 CLINICAL DATA:  Acute onset of right-sided chest pain, radiating to the jaw. Initial encounter. EXAM: CHEST  2 VIEW COMPARISON:  Chest radiograph performed 09/11/2015 FINDINGS: The lungs are well-aerated. Mild left basilar opacity likely reflects atelectasis. There is no evidence of pleural effusion or pneumothorax. The heart is normal in size; the mediastinal contour is within normal limits. No acute osseous abnormalities are seen. Cervical spinal fusion hardware is noted. IMPRESSION: Mild  left basilar airspace opacity likely reflects atelectasis. Lungs otherwise clear. Electronically Signed   By: Garald Balding M.D.   On: 07/04/2016 01:46     ASSESSMENT AND PLAN   1. Unstable angina - known CAD with DES to LAD in 2004. Had episode of chest tightness this morning. Currently chest pain free. Has headache on imdur. Will prehydrate him  - Continue ASA, statin , BB and Imdur for now.  2. HLD - 02/20/2016: Cholesterol 124; HDL 28.20; LDL Cholesterol 78; Triglycerides 89.0; VLDL 17.8  - - intolerant to Atorvastatin in the past. Continue Pravastatin.   3. Stage 3 CKD - Creatinine 1.98 on last admission, seems at baseline. Follow closely.    Signed,  Leanor Kail, PA-C 07/07/2016, 4:23 PM Pager [phone]  History and all data above reviewed.  Patient examined.  I agree with the findings as above.  The patient presents for elective cath.  He did have pain this morning.  The patient exam reveals COR:RRR  ,  Lungs: Clear  ,  Abd: Positive bowel sounds, no rebound no guarding, Ext No edema, right arm congenitally small  .  All available labs, radiology testing, previous records reviewed. Agree with documented assessment and plan. Chest pain:  Cath in AM.  Hydrate starting tonight.  Likely right femoral cath with congenital limb contracture.  No LV gram.    Jilleen Essner  5:17 PM  07/07/2016

## 2016-07-08 ENCOUNTER — Other Ambulatory Visit: Payer: Self-pay | Admitting: Physician Assistant

## 2016-07-08 ENCOUNTER — Encounter (HOSPITAL_COMMUNITY): Payer: Self-pay | Admitting: Physician Assistant

## 2016-07-08 ENCOUNTER — Encounter (HOSPITAL_COMMUNITY)
Admission: AD | Disposition: A | Discharge status: Home / Self Care | Payer: Self-pay | Source: Ambulatory Visit | Attending: Cardiology

## 2016-07-08 DIAGNOSIS — R072 Precordial pain: Secondary | ICD-10-CM | POA: Diagnosis not present

## 2016-07-08 DIAGNOSIS — E669 Obesity, unspecified: Secondary | ICD-10-CM

## 2016-07-08 DIAGNOSIS — N183 Chronic kidney disease, stage 3 unspecified: Secondary | ICD-10-CM

## 2016-07-08 DIAGNOSIS — E785 Hyperlipidemia, unspecified: Secondary | ICD-10-CM

## 2016-07-08 DIAGNOSIS — I2510 Atherosclerotic heart disease of native coronary artery without angina pectoris: Secondary | ICD-10-CM

## 2016-07-08 DIAGNOSIS — I25119 Atherosclerotic heart disease of native coronary artery with unspecified angina pectoris: Secondary | ICD-10-CM | POA: Diagnosis not present

## 2016-07-08 DIAGNOSIS — E119 Type 2 diabetes mellitus without complications: Secondary | ICD-10-CM | POA: Diagnosis not present

## 2016-07-08 DIAGNOSIS — I200 Unstable angina: Secondary | ICD-10-CM | POA: Diagnosis not present

## 2016-07-08 HISTORY — PX: CARDIAC CATHETERIZATION: SHX172

## 2016-07-08 LAB — BASIC METABOLIC PANEL
Anion gap: 8 (ref 5–15)
BUN: 21 mg/dL — ABNORMAL HIGH (ref 6–20)
CO2: 26 mmol/L (ref 22–32)
Calcium: 8.9 mg/dL (ref 8.9–10.3)
Chloride: 104 mmol/L (ref 101–111)
Creatinine, Ser: 1.66 mg/dL — ABNORMAL HIGH (ref 0.61–1.24)
GFR calc Af Amer: 45 mL/min — ABNORMAL LOW (ref >60)
GFR calc non Af Amer: 39 mL/min — ABNORMAL LOW (ref >60)
Glucose, Bld: 115 mg/dL — ABNORMAL HIGH (ref 65–99)
Potassium: 4.3 mmol/L (ref 3.5–5.1)
Sodium: 138 mmol/L (ref 135–145)

## 2016-07-08 LAB — CBC
HCT: 39.2 % (ref 39.0–52.0)
Hemoglobin: 12.7 g/dL — ABNORMAL LOW (ref 13.0–17.0)
MCH: 30.5 pg (ref 26.0–34.0)
MCHC: 32.4 g/dL (ref 30.0–36.0)
MCV: 94 fL (ref 78.0–100.0)
Platelets: 215 10*3/uL (ref 150–400)
RBC: 4.17 MIL/uL — ABNORMAL LOW (ref 4.22–5.81)
RDW: 13.9 % (ref 11.5–15.5)
WBC: 6.5 10*3/uL (ref 4.0–10.5)

## 2016-07-08 LAB — PROTIME-INR
INR: 1.08
Prothrombin Time: 14 seconds (ref 11.4–15.2)

## 2016-07-08 LAB — LIPID PANEL
Cholesterol: 98 mg/dL (ref 0–200)
HDL: 28 mg/dL — ABNORMAL LOW (ref >40)
LDL Cholesterol: 58 mg/dL (ref 0–99)
Total CHOL/HDL Ratio: 3.5 RATIO
Triglycerides: 59 mg/dL (ref <150)
VLDL: 12 mg/dL (ref 0–40)

## 2016-07-08 SURGERY — LEFT HEART CATH AND CORONARY ANGIOGRAPHY
Anesthesia: LOCAL

## 2016-07-08 MED ORDER — MIDAZOLAM HCL 2 MG/2ML IJ SOLN
INTRAMUSCULAR | Status: AC
  Filled 2016-07-08: qty 2

## 2016-07-08 MED ORDER — SODIUM CHLORIDE 0.9 % WEIGHT BASED INFUSION
3.0000 mL/kg/h | INTRAVENOUS | Status: AC
  Administered 2016-07-08: 3 mL/kg/h via INTRAVENOUS

## 2016-07-08 MED ORDER — SODIUM CHLORIDE 0.9 % IV SOLN: 250.0000 mL | INTRAVENOUS | Status: DC | PRN

## 2016-07-08 MED ORDER — IOPAMIDOL (ISOVUE-370) INJECTION 76%
INTRAVENOUS | Status: AC
  Filled 2016-07-08: qty 100

## 2016-07-08 MED ORDER — FENTANYL CITRATE (PF) 100 MCG/2ML IJ SOLN
INTRAMUSCULAR | Status: DC | PRN
  Administered 2016-07-08: 25 ug via INTRAVENOUS

## 2016-07-08 MED ORDER — HEPARIN (PORCINE) IN NACL 2-0.9 UNIT/ML-% IJ SOLN
INTRAMUSCULAR | Status: DC | PRN
  Administered 2016-07-08: 1000 mL

## 2016-07-08 MED ORDER — SODIUM CHLORIDE 0.9% FLUSH: 3.0000 mL | INTRAVENOUS | Status: DC | PRN

## 2016-07-08 MED ORDER — IOPAMIDOL (ISOVUE-370) INJECTION 76%
INTRAVENOUS | Status: DC | PRN
  Administered 2016-07-08: 40 mL

## 2016-07-08 MED ORDER — LIDOCAINE HCL (PF) 1 % IJ SOLN
INTRAMUSCULAR | Status: DC | PRN
  Administered 2016-07-08: 20 mL via SUBCUTANEOUS

## 2016-07-08 MED ORDER — FENTANYL CITRATE (PF) 100 MCG/2ML IJ SOLN
INTRAMUSCULAR | Status: AC
  Filled 2016-07-08: qty 2

## 2016-07-08 MED ORDER — LIDOCAINE HCL (PF) 1 % IJ SOLN
INTRAMUSCULAR | Status: AC
  Filled 2016-07-08: qty 30

## 2016-07-08 MED ORDER — HEPARIN (PORCINE) IN NACL 2-0.9 UNIT/ML-% IJ SOLN
INTRAMUSCULAR | Status: AC
  Filled 2016-07-08: qty 1500

## 2016-07-08 MED ORDER — MIDAZOLAM HCL 2 MG/2ML IJ SOLN
INTRAMUSCULAR | Status: DC | PRN
  Administered 2016-07-08: 2 mg via INTRAVENOUS

## 2016-07-08 MED ORDER — SODIUM CHLORIDE 0.9% FLUSH: 3.0000 mL | Freq: Two times a day (BID) | INTRAVENOUS | Status: DC

## 2016-07-08 SURGICAL SUPPLY — 8 items
CATH IMPULSE 5F ANG/FL3.5 (CATHETERS) ×2 IMPLANT
CATH INFINITI 5FR JL4 (CATHETERS) ×2 IMPLANT
KIT HEART LEFT (KITS) ×2 IMPLANT
PACK CARDIAC CATHETERIZATION (CUSTOM PROCEDURE TRAY) ×2 IMPLANT
SHEATH PINNACLE 5F 10CM (SHEATH) ×2 IMPLANT
TRANSDUCER W/STOPCOCK (MISCELLANEOUS) ×2 IMPLANT
TUBING CIL FLEX 10 FLL-RA (TUBING) ×2 IMPLANT
WIRE EMERALD 3MM-J .035X150CM (WIRE) ×2 IMPLANT

## 2016-07-08 NOTE — Discharge Summary (Signed)
Discharge Summary    Patient ID: MARISSA GROSZ,  MRN: [id number], DOB/AGE: Feb 14, 1941 75 y.o.  Admit date: 07/07/2016 Discharge date: 07/08/2016  Primary Care Provider: Ria Bush Primary Cardiologist: Dr. Burt Knack  Discharge Diagnoses    Principal Problem:   Precordial chest pain Active Problems:   Hyperlipidemia   Coronary atherosclerosis of native coronary artery   GERD   CKD (chronic kidney disease), stage III   Hx of peptic ulcer   Obesity, Class I, BMI 30-34.9   Diet-controlled diabetes mellitus [facility])    Diagnostic Studies/Procedures    LHC 07/08/16 Conclusion  1. Widely patent proximal LAD stent with only mild in-stent restenosis 2. Moderate stenosis of a very small OM branch of the circumflex, otherwise widely patent circumflex 3. Angiographically normal right coronary artery Suspect a noncardiac chest pain. Continue medical therapy for CAD.  _____________   History of Present Illness & Hospital Course    Mr. 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 He was recently admitted 07/04/16 for episodes of right-sided chest pain that had been going on for the past few weeks. No association with rest or with exertion, occurring at random, radiating to the side of his neck occasioanlly. He had no associated dyspnea, nausea, vomiting, or diaphoresis. Troponin was negative. EKG without acute changes. He was seen by Dr. Haroldine Laws and was started on carvedilol 3.125 bid and Imdur 30mg . He was discharged home with plan to readmit 9/26 for pre-cath hydration in prep for cath 07/08/16. Recent baseline Cr appears  1.8-1.9. After hydration overnight and into this morning, Cr was 1.66. Cardiac cath this afternoon showed widely patent proximal LAD stent with only mild in-stent restenosis, widely patent proximal LAD stent with only mild in-stent restenosis, and angiographically normal right coronary artery. It was suspected his chest pain was noncardiac. He was instructed to increase his PPI back to BID as he previously had been on. Per discussion with Dr. Burt Knack, he will receive 4 hours of post-cath fluids after which he may be discharged home with outpatient BMET on Friday. He was instructed to hold his ramipril until that time. We will then have him come back in for a post-cath check in 2 weeks. F/u autopopulates on AVS -10/9 @ 9am with me. He was advised to touch base with his PCP and GI doctor to discuss further evaluation of non-cardiac chest pain given his prior GI history. Dr. Burt Knack has seen and examined the patient today and feels he is stable for discharge.  We will plan to continue the Imdur and carvedilol he was recently prescribed to help with his BP while off ramipril. When we resume ramipril we can likely stop the Imdur.  Consultants: N/A  _____________  Discharge Vitals Blood pressure (!) 109/53, pulse (!) 55, temperature 98.1 F (36.7 C), temperature source Oral, resp. rate 12, height 6\' 2"  (1.88 m), weight 250 lb 12.8 oz (113.8 kg), SpO2 96 %.  Filed Weights   07/07/16 1604  Weight: 250 lb 12.8 oz (113.8 kg)    Labs & Radiologic Studies    CBC  Recent Labs  07/07/16 1837 07/08/16 0256  WBC 7.5 6.5  HGB 13.3  12.7*  HCT 41.2 39.2  MCV 93.6 94.0  PLT 234 123456   Basic Metabolic Panel  Recent Labs  07/07/16 1837 07/08/16 0256  NA  --  138  K  --  4.3  CL  --  104  CO2  --  26  GLUCOSE  --  115*  BUN  --  21*  CREATININE 1.79* 1.66*  CALCIUM  --  8.9   Fasting Lipid Panel  Recent Labs  07/08/16 0256  CHOL 98  HDL 28*  LDLCALC 58  TRIG 59  CHOLHDL 3.5  _____________   Dg Chest 2 View  Result Date: 07/04/2016 CLINICAL DATA:  Acute onset of right-sided chest pain, radiating to the jaw. Initial encounter. EXAM: CHEST  2 VIEW COMPARISON:  Chest radiograph performed 09/11/2015 FINDINGS: The lungs are well-aerated. Mild left basilar opacity likely reflects atelectasis. There is no evidence of pleural effusion or pneumothorax. The heart is normal in size; the mediastinal contour is within normal limits. No acute osseous abnormalities are seen. Cervical spinal fusion hardware is noted. IMPRESSION: Mild left basilar airspace opacity likely reflects atelectasis. Lungs otherwise clear. Electronically Signed   By: Garald Balding M.D.   On: 07/04/2016 01:46   Disposition   Pt is being discharged home today in good condition.  Follow-up Plans & Appointments    Follow-up Information    [place] Office .   Specialty:  Cardiology Why:  Please come to Dr. Antionette Char office on Friday 07/10/16 to have bloodwork to recheck your kidney function. This will help [place] decide when to restart your ramipril. Your follow-up appt with Baljit Liebert, one of Dr. Antionette Char PAs, is listed below. Contact information: [address], [place] [phone]       PCP and Gastroenterologist .   Why:  Please touch base with your PCP/GI doctor to discuss further workup of non-cardiac chest pain since your chest pain did not appear to be related to your heart.         Discharge Instructions    Diet - low sodium heart healthy    Complete by:  As directed    Increase activity slowly    Complete by:  As directed    Do not take any more ramipril for now. We will plan to restart this if your kidney function remains stable - we will let you know when it is safe to do so. We would recommend to take your Protonix twice a day for now.  No driving for 2 days. No lifting over 5 lbs for 1 week. No sexual activity for 1 week. Keep procedure site clean &  dry. If you notice increased pain, swelling, bleeding or pus, call/return!  You may shower, but no soaking baths/hot tubs/pools for 1 week.      Discharge Medications     Medication List    STOP taking these medications   ramipril 5 MG capsule Commonly known as:  ALTACE     TAKE these medications   aspirin EC 81 MG tablet Take 81 mg by mouth daily.   carvedilol 3.125 MG tablet Commonly known as:  COREG Take 1 tablet (3.125 mg total) by mouth 2 (two) times daily with a meal.   diphenhydramine-acetaminophen 25-500 MG Tabs tablet Commonly known as:  TYLENOL PM Take 2 tablets by mouth at bedtime as needed (sleep).   isosorbide mononitrate 30 MG 24 hr tablet Commonly known as:  IMDUR Take 1 tablet (30 mg  total) by mouth daily.   lovastatin 40 MG tablet Commonly known as:  MEVACOR TAKE ONE TABLET AT BEDTIME   pantoprazole 40 MG tablet Commonly known as:  PROTONIX Take 1 tablet (40 mg total) by mouth 2 (two) times daily. What changed:  when to take this   vitamin B-12 1000 MCG tablet Commonly known as:  CYANOCOBALAMIN Take 1,000 mcg by mouth daily.   Vitamin D3 5000 units Tabs Take 5,000 Units by mouth daily.      Allergies:  Allergies  Allergen Reactions  . Codeine Hives and Itching  . Lipitor [Atorvastatin] Other (See Comments)    Reaction:  Muscle pain   . Morphine Hives and Itching  . Simvastatin Other (See Comments)    Reaction:  Muscle pain     Outstanding Labs/Studies   BMET on 07/10/16  Duration of Discharge Encounter   Greater than 30 minutes including physician time.  Signed, Charlie Pitter PA-C 07/08/2016, 4:46 PM

## 2016-07-08 NOTE — Progress Notes (Signed)
Per discussion with Dr. Burt Knack, [place] without coronary lesion to explain CP. Plan: - continue IV fluids and bedrest for 4 hours post-cath - hold ramipril until instructed to resume, decision pending outpatient BMET on Friday - will continue the carvedilol and Imdur that he was recently prescribed for now to help with BP while off ramipril - will increase protonix back to prior dose of BID - if he ambulates without complication after bedrest is up, will be able to dc this evening  Reviewed plan with patient and his nurse. Have also signed out to the evening APP. All the paperwork and discharge process is done. DC order is in for 8:30pm with instruction to DC if patient ambulates without complication.  If for some reason the nurse cannot print the AVS closer to the patient's departure, this is likely because he's gotten a saline flush, PRN med, or education booklet in the meantime. If this is an issue, on-call cardiology fellow can go to Provider Navigator -> Discharge icon -> Discharge orders -> and click Don't Prescribe on any new interfering orders.  Dayna Dunn PA-C

## 2016-07-08 NOTE — Progress Notes (Signed)
Femoral sheath removed. Manual pressure applied for 25 min with minimal bleeding. VSS and no complications observed. Pt education given and he verbalized understanding. Bed rest for 4 hours starts at 4:30 pm.   Will continue to monitor.   Earlie Lou

## 2016-07-08 NOTE — Interval H&P Note (Signed)
Cath Lab Visit (complete for each Cath Lab visit)  Clinical Evaluation Leading to the Procedure:   ACS: No.  Non-ACS:    Anginal Classification: CCS III  Anti-ischemic medical therapy: Minimal Therapy (1 class of medications)  Non-Invasive Test Results: No non-invasive testing performed  Prior CABG: No previous CABG      History and Physical Interval Note:  07/08/2016 3:19 PM  Luis Sweeney  has presented today for surgery, with the diagnosis of chest pain  The various methods of treatment have been discussed with the patient and family. After consideration of risks, benefits and other options for treatment, the patient has consented to  Procedure(s): Left Heart Cath and Coronary Angiography (N/A) as a surgical intervention .  The patient's history has been reviewed, patient examined, no change in status, stable for surgery.  I have reviewed the patient's chart and labs.  Questions were answered to the patient's satisfaction.     Sherren Mocha

## 2016-07-08 NOTE — Progress Notes (Addendum)
Bedrest completed at 20:30.  Pt cath site clean/dry/intact, level 0.  Pt denies pain, VSS, and ambulating without complications.  Discharge packet given to patient and instructions reviewed with patient/patient's wife.  Verbalized understanding.  Pt discharged home via wheelchair.

## 2016-07-08 NOTE — Progress Notes (Signed)
Patient Name: Luis Sweeney Date of Encounter: 07/08/2016  Hospital Problem List     Principal Problem:   Unstable angina [facility]) Active Problems:   Hyperlipidemia   Coronary atherosclerosis of native coronary artery   CKD (chronic kidney disease), stage III   Obesity, Class I, BMI 30-34.9   Diet-controlled diabetes mellitus ([place])   Subjective   No further CP. No SOB. Watched video on cath.  Inpatient Medications    . aspirin EC  81 mg Oral Daily  . carvedilol  3.125 mg Oral BID WC  . cholecalciferol  5,000 Units Oral Daily  . heparin  5,000 Units Subcutaneous Q8H  . isosorbide mononitrate  30 mg Oral Daily  . pantoprazole  40 mg Oral BID  . pravastatin  40 mg Oral q1800  . ramipril  5 mg Oral BID  . sodium chloride flush  3 mL Intravenous Q12H  . vitamin B-12  1,000 mcg Oral Daily    Vital Signs    Vitals:   07/07/16 1604 07/07/16 1947 07/08/16 0434 07/08/16 0757  BP: (!) 118/57 (!) 121/49 (!) 122/38 (!) 127/54  Pulse: 75 68 66 66  Resp: 18 18 18    Temp: 97.9 F (36.6 C) 98.7 F (37.1 C) 98.1 F (36.7 C)   TempSrc: Oral Oral Oral   SpO2: 96% 97% 95%   Weight: 250 lb 12.8 oz (113.8 kg)     Height: 6\' 2"  (1.88 m)      No intake or output data in the 24 hours ending 07/08/16 1052 Filed Weights   07/07/16 1604  Weight: 250 lb 12.8 oz (113.8 kg)    Physical Exam    General: Well developed, well nourished WM obese, in no acute distress. HEENT: Normocephalic, atraumatic, sclera non-icteric, no xanthomas, nares are without discharge. Neck: Negative for carotid bruits. JVP not elevated. Lungs: Clear bilaterally to auscultation without wheezes, rales, or rhonchi. Breathing is unlabored. Cardiac: RRR S1 S2 without murmurs, rubs, or gallops.  Abdomen: Soft, non-tender, non-distended with normoactive bowel sounds. No rebound/guarding. Extremities: No clubbing or cyanosis. No edema. Distal pedal pulses are 2+ and equal bilaterally. Skin: Warm and dry, no  significant rash. Neuro: Alert and oriented X 3. Strength and sensation in tact. Psych:  Responds to questions appropriately with a normal affect.  Labs    CBC  Recent Labs  07/07/16 1837 07/08/16 0256  WBC 7.5 6.5  HGB 13.3 12.7*  HCT 41.2 39.2  MCV 93.6 94.0  PLT 234 123456   Basic Metabolic Panel  Recent Labs  07/07/16 1837 07/08/16 0256  NA  --  138  K  --  4.3  CL  --  104  CO2  --  26  GLUCOSE  --  115*  BUN  --  21*  CREATININE 1.79* 1.66*  CALCIUM  --  8.9   Fasting Lipid Panel  Recent Labs  07/08/16 0256  CHOL 98  HDL 28*  LDLCALC 58  TRIG 59  CHOLHDL 3.5     Telemetry    NSR  Radiology    Dg Chest 2 View  Result Date: 07/04/2016 CLINICAL DATA:  Acute onset of right-sided chest pain, radiating to the jaw. Initial encounter. EXAM: CHEST  2 VIEW COMPARISON:  Chest radiograph performed 09/11/2015 FINDINGS: The lungs are well-aerated. Mild left basilar opacity likely reflects atelectasis. There is no evidence of pleural effusion or pneumothorax. The heart is normal in size; the mediastinal contour is within normal limits. No acute osseous abnormalities  are seen. Cervical spinal fusion hardware is noted. IMPRESSION: Mild left basilar airspace opacity likely reflects atelectasis. Lungs otherwise clear. Electronically Signed   By: Garald Balding M.D.   On: 07/04/2016 01:46     Patient Profile     81M with CAD (s/p DES to LAD in 2004), HTN, HLD (intolerant of atorvastatin), Stage 3 CKD, congenitally small right arm who presented to [facility]  for pre hydration prior to planned cath for unstable angina.  Assessment & Plan    1. Unstable angina/CAD - the plan is for cath for today. Risks and benefits of cardiac catheterization have been discussed with the patient. These include bleeding, infection, kidney damage, stroke, heart attack, death. The patient understands these risks and is willing to proceed.  2. HLD - continue pravastatin. LDL controlled. Intolerant  of atorvastatin in the past.  3. Stage 3 CKD - baseline appears 1.9. Down to 1.66 with hydration. Will hold further rampiril. Reviewed with Dr. Burt Knack. Avoid LV gram.  Signed, Charlie Pitter, PA-C  07/08/2016, 10:52 AM

## 2016-07-09 ENCOUNTER — Encounter (HOSPITAL_COMMUNITY): Payer: Self-pay | Admitting: Cardiovascular Disease

## 2016-07-09 ENCOUNTER — Telehealth: Payer: Self-pay

## 2016-07-09 MED FILL — Heparin Sodium (Porcine) 2 Unit/ML in Sodium Chloride 0.9%: INTRAMUSCULAR | Qty: 500 | Status: AC

## 2016-07-09 NOTE — Telephone Encounter (Signed)
Spoke with patient regarding heart cath procedure on 27th. Pt reports he has temporarily stopped all medication until he gets results from procedure. Pt previously had TCM outreach on 07/06/16. Appt scheduled with PCP on 07/16/16.

## 2016-07-10 ENCOUNTER — Other Ambulatory Visit: Payer: PPO | Admitting: *Deleted

## 2016-07-10 DIAGNOSIS — N183 Chronic kidney disease, stage 3 unspecified: Secondary | ICD-10-CM

## 2016-07-10 LAB — BASIC METABOLIC PANEL
BUN: 21 mg/dL (ref 7–25)
CO2: 27 mmol/L (ref 20–31)
Calcium: 8.6 mg/dL (ref 8.6–10.3)
Chloride: 104 mmol/L (ref 98–110)
Creat: 1.8 mg/dL — ABNORMAL HIGH (ref 0.70–1.18)
Glucose, Bld: 108 mg/dL — ABNORMAL HIGH (ref 65–99)
Potassium: 4.2 mmol/L (ref 3.5–5.3)
Sodium: 138 mmol/L (ref 135–146)

## 2016-07-10 NOTE — Addendum Note (Signed)
Addended by: Eulis Foster on: 07/10/2016 10:02 AM   Modules accepted: Orders

## 2016-07-11 NOTE — Telephone Encounter (Signed)
plz have pt restart prior meds and will discuss at [place] next week.

## 2016-07-13 NOTE — Telephone Encounter (Signed)
Patient notified and verbalized understanding. 

## 2016-07-16 ENCOUNTER — Ambulatory Visit (INDEPENDENT_AMBULATORY_CARE_PROVIDER_SITE_OTHER): Payer: PPO | Admitting: Family Medicine

## 2016-07-16 ENCOUNTER — Encounter: Payer: Self-pay | Admitting: Family Medicine

## 2016-07-16 VITALS — BP 120/70 | HR 80 | Temp 97.5°F | Wt 254.2 lb

## 2016-07-16 DIAGNOSIS — E119 Type 2 diabetes mellitus without complications: Secondary | ICD-10-CM

## 2016-07-16 DIAGNOSIS — K219 Gastro-esophageal reflux disease without esophagitis: Secondary | ICD-10-CM

## 2016-07-16 DIAGNOSIS — K315 Obstruction of duodenum: Secondary | ICD-10-CM

## 2016-07-16 DIAGNOSIS — R079 Chest pain, unspecified: Secondary | ICD-10-CM | POA: Diagnosis not present

## 2016-07-16 DIAGNOSIS — I25119 Atherosclerotic heart disease of native coronary artery with unspecified angina pectoris: Secondary | ICD-10-CM

## 2016-07-16 MED ORDER — PANTOPRAZOLE SODIUM 40 MG PO TBEC: 40.0000 mg | DELAYED_RELEASE_TABLET | Freq: Every day | ORAL | 11 refills | Status: DC

## 2016-07-16 NOTE — Assessment & Plan Note (Signed)
Chronic, stable. Foot exam today. Encouraged he schedule eye exam as due.  

## 2016-07-16 NOTE — Assessment & Plan Note (Signed)
Recent catheterization reassuringly stable.

## 2016-07-16 NOTE — Assessment & Plan Note (Signed)
Continue daily ppi  

## 2016-07-16 NOTE — Assessment & Plan Note (Signed)
Off PPI x 8 months, has restarted in the past month. Continue daily PPI.

## 2016-07-16 NOTE — Progress Notes (Signed)
BP 120/70   Pulse 80   Temp 97.5 F (36.4 C) (Oral)   Wt 254 lb 4 oz (115.3 kg)   BMI 32.64 kg/m    CC: hosp f/u visit Subjective:    Patient ID: Luis Sweeney, male    DOB: [birth year], 75 y.o.   MRN: [id number]  HPI: Luis Sweeney is a 75 y.o. male presenting on 07/16/2016 for Follow-up   Recent hospitalization with right sided chest pain with radiation to R jaw, ruled out for ACS but given CAD history recommended catheterization. Discharged and returned later in the week for heart catheterization which was reassuringly stable (moderate stenosis of small OM branch of Cx). Thought non cardiac source of chest pain. He does have GERD with history of recurrent duodenal ulcer with hemorrhage, duodenal stricture, and NAFLD. Has seen Dr [name] previously. Last EGD 2015. Protonix was increased to BID dosing. He is only taking QD dosing. He recently restarted this - was off for last 8 months.   Since home, feeling well but has had persistent infrequent discomfort described as pressure/tightness. Denies fevers/chills, nausea/vomiting, BM changes, GERD or indigestion/bloating.   Has f/u with cardiology 07/20/2016. He has stopped isosorbide and carvedilol due to headache side effect.   Lab Results  Component Value Date   HGBA1C 6.8 (H) 02/20/2016  Diet controlled diabetes.  Diabetic Foot Exam - Simple   Simple Foot Form Diabetic Foot exam was performed with the following findings:  Yes 07/16/2016  9:35 AM  Visual Inspection No deformities, no ulcerations, no other skin breakdown bilaterally:  Yes Sensation Testing Intact to touch and monofilament testing bilaterally:  Yes Pulse Check See comments:  Yes Comments Diminished DP bilaterally      Admit date: 07/04/2016 Discharge date: 07/04/2016 TCM phone call: 07/06/2016   Recommendations for Outpatient Follow-up:  1. Repeat BMET to follow electrolytes and renal function  2. Please reassess BP and adjust antihypertensive regimen as  needed   Discharge Diagnoses:  Principal Problem:   Chest pain Active Problems:   Coronary atherosclerosis of native coronary artery   Benign essential HTN   HLD (hyperlipidemia)   Morbid obesity due to excess calories [facility])  Discharge Condition: stable and improved. Will discharge home with instructions to follow up with PCP in 2 weeks and to return on Tuesday night (9/26) for admission/IV pre-cath hydration and have cath on Wednesday 9/27 by Dr. Burt Knack  Diet recommendation: heart healthy diet and low calorie diet   LHC 07/08/16 Conclusion  1. Widely patent proximal LAD stent with only mild in-stent restenosis 2. Moderate stenosis of a very small OM branch of the circumflex, otherwise widely patent circumflex 3. Angiographically normal right coronary artery Suspect a noncardiac chest pain. Continue medical therapy for CAD.  Relevant past medical, surgical, family and social history reviewed and updated as indicated. Interim medical history since our last visit reviewed. Allergies and medications reviewed and updated. Current Outpatient Prescriptions on File Prior to Visit  Medication Sig  . aspirin EC 81 MG tablet Take 81 mg by mouth daily.  . Cholecalciferol (VITAMIN D3) 5000 UNITS TABS Take 5,000 Units by mouth daily.  . diphenhydramine-acetaminophen (TYLENOL PM) 25-500 MG TABS tablet Take 2 tablets by mouth at bedtime as needed (sleep).  . lovastatin (MEVACOR) 40 MG tablet TAKE ONE TABLET AT BEDTIME  . vitamin B-12 (CYANOCOBALAMIN) 1000 MCG tablet Take 1,000 mcg by mouth daily.  . carvedilol (COREG) 3.125 MG tablet Take 1 tablet (3.125 mg total) by mouth 2 (two)  times daily with a meal. (Patient not taking: Reported on 07/16/2016)  . isosorbide mononitrate (IMDUR) 30 MG 24 hr tablet Take 1 tablet (30 mg total) by mouth daily. (Patient not taking: Reported on 07/16/2016)   No current facility-administered medications on file prior to visit.     Review of Systems Per HPI unless  specifically indicated in ROS section     Objective:    BP 120/70   Pulse 80   Temp 97.5 F (36.4 C) (Oral)   Wt 254 lb 4 oz (115.3 kg)   BMI 32.64 kg/m   Wt Readings from Last 3 Encounters:  07/16/16 254 lb 4 oz (115.3 kg)  07/07/16 250 lb 12.8 oz (113.8 kg)  07/04/16 255 lb 4.8 oz (115.8 kg)    Physical Exam  Constitutional: He appears well-developed and well-nourished. No distress.  HENT:  Head: Normocephalic and atraumatic.  Right Ear: External ear normal.  Left Ear: External ear normal.  Nose: Nose normal.  Mouth/Throat: Oropharynx is clear and moist. No oropharyngeal exudate.  Eyes: Conjunctivae and EOM are normal. Pupils are equal, round, and reactive to light. No scleral icterus.  Neck: Normal range of motion. Neck supple.  Cardiovascular: Normal rate, regular rhythm, normal heart sounds and intact distal pulses.   No murmur heard. Pulmonary/Chest: Effort normal and breath sounds normal. No respiratory distress. He has no wheezes. He has no rales. He exhibits no tenderness.  No reproducible chest wall pain  Abdominal: Soft. Normal appearance and bowel sounds are normal. He exhibits no distension and no mass. There is no hepatosplenomegaly. There is no tenderness. There is no rigidity, no rebound, no guarding, no CVA tenderness and negative Murphy's sign.  Musculoskeletal: He exhibits no edema.  See HPI for foot exam if done Chronic R hand erb's palsy  Lymphadenopathy:    He has no cervical adenopathy.  Skin: Skin is warm and dry. No rash noted.  Psychiatric: He has a normal mood and affect.  Nursing note and vitals reviewed.  Results for orders placed or performed in visit on [id number]  Basic metabolic panel  Result Value Ref Range   Sodium 138 135 - 146 mmol/L   Potassium 4.2 3.5 - 5.3 mmol/L   Chloride 104 98 - 110 mmol/L   CO2 27 20 - 31 mmol/L   Glucose, Bld 108 (H) 65 - 99 mg/dL   BUN 21 7 - 25 mg/dL   Creat 1.80 (H) 0.70 - 1.18 mg/dL   Calcium 8.6 8.6 -  10.3 mg/dL   Lab Results  Component Value Date   WBC 6.5 07/08/2016   HGB 12.7 (L) 07/08/2016   HCT 39.2 07/08/2016   MCV 94.0 07/08/2016   PLT 215 07/08/2016    Lab Results  Component Value Date   ALT 14 02/20/2016   AST 17 02/20/2016   ALKPHOS 28 (L) 02/20/2016   BILITOT 0.4 02/20/2016    Lab Results  Component Value Date   LDLCALC 58 07/08/2016      Assessment & Plan:   Problem List Items Addressed This Visit    Coronary atherosclerosis of native coronary artery (Chronic)    Recent catheterization reassuringly stable.       Relevant Medications   ramipril (ALTACE) 5 MG capsule   Diet-controlled diabetes mellitus (HCC)    Chronic, stable. Foot exam today. Encouraged he schedule eye exam as due.       Relevant Medications   ramipril (ALTACE) 5 MG capsule   Duodenal stricture - recurrent  Off PPI x 8 months, has restarted in the past month. Continue daily PPI.       GERD    Continue daily ppi      Relevant Medications   pantoprazole (PROTONIX) 40 MG tablet   Right-sided chest pain    S/p stable cardiac evaluation. Appreciate cards care. In GI history, anticipate related to known duodenal stricture/GERD. Not consistent with gallstones. Pt has restarted protonix - discussed use. Update if not improving with treatment.        Other Visit Diagnoses   None.      Follow up plan: No Follow-up on file.  Ria Bush, MD

## 2016-07-16 NOTE — Assessment & Plan Note (Addendum)
S/p stable cardiac evaluation. Appreciate cards care. In GI history, anticipate related to known duodenal stricture/GERD. Not consistent with gallstones. Pt has restarted protonix - discussed use. Update if not improving with treatment.

## 2016-07-16 NOTE — Patient Instructions (Addendum)
Good to see you today, call us with questions. Continue current medicines. I'd suggest starting carvedilol. Touch base with cardiology about medicines. Ask Dr Sandra Cockayne to send [place] next eye report Continue pantoprazole 40mg  daily.

## 2016-07-17 ENCOUNTER — Telehealth: Payer: Self-pay | Admitting: Family Medicine

## 2016-07-17 NOTE — Telephone Encounter (Signed)
Pt called to follow from his visit recently to report that he had eaten some fried food and experienced symptoms that he discussed during the visi.  He wants to discuss a referal to a GI doctor about his gall bladder.  Can you please follow up with him.  Thanks!

## 2016-07-17 NOTE — Telephone Encounter (Signed)
I think his symptoms are likely GI in origin after having stopped protonix so I'm glad he has restarted. Previous evaluation 2015 was negative for gallstone trouble. I would expect symptoms to improve over time as long as he continues protonix.  Does he still want referral back to Dr [name]?

## 2016-07-19 NOTE — Progress Notes (Signed)
Cardiology Office Note    Date:  07/20/2016  ID:  Luis Sweeney, DOB 03-23-1941, MRN [id number] PCP:  Ria Bush, MD  Cardiologist: Dr. Burt Knack   Chief Complaint: f/u cath  History of Present Illness:  Luis Sweeney is a 75 y.o. 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 He was recently admitted for chest pain concerning for angina - started on BB and Imdur and discharged with plan to return for OP cath with pre-hydration. LHC 07/08/16 showed widely patent proximal LAD stent with only mild in-stent restenosis, widely patent proximal LAD stent with only mild in-stent restenosis, and angiographically normal right coronary artery. It was suspected his chest pain was noncardiac. PPI was increased to BID (pt had not been taking for 8 months so he actually restarted this only once daily). His ramipril was held to reduce risk of CIN. Baseline Cr 1.8-1.9 (down to 1.66 with hydration) - f/u BMET on 07/10/16 was 1.80. We had planned for him to continue Imdur/BB until seen back in clinic at which time we would plan to resume ACEI but he stopped these due to HA. He saw PCP who recommended continue surveillance on PPI, further eval if sx not improving.  He returns for follow-up today overall doing well from a cardiac standpoint. He has since restarted his ramipiril. He did notice an episode of chest pain about 1 hour after eating greasy food from [place] but states he was able to eat fatty food since that time without any adverse event. He put in a call to his PCP on Friday inquiring whether he needs to go through them to get back to his GI Dr. [name] or if he  can just call them himself. No issues with cath site. No dizziness, SOB, LEE, syncope. No melena, BRBPR, hematemesis.   Past Medical History:  Diagnosis Date  . Acute duodenal ulcer with hemorrhage but without obstruction    required  bld. transfusion  . Anemia, iron deficiency   . Arthritis    OA- pt. reports its. minor  . CAD (coronary artery disease) 2004   a. s/p MI treated with drug-eluting stent placement to the proximal left anterior descending in late 2004  . CKD (chronic kidney disease), stage III   . Diverticulosis of colon   . Duodenal stricture - recurrent 09/21/2014  . 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 GERD (gastroesophageal reflux disease)   . H/O hiatal hernia   . Helicobacter pylori gastritis    recurrent  . Hypercholesteremia   . Hyperglycemia   . Hypertension   . Myocardial infarct   . NAFLD (nonalcoholic fatty liver disease) 2015   with liver cysts  . Paralysis of upper limb (HCC)    R arm- birth injury  . Personal history of colonic polyps   . Urticaria    Lat left arm    Past Surgical History:  Procedure Laterality Date  . ANTERIOR CERVICAL DECOMP/DISCECTOMY FUSION  03/2013   C3/4, C4/5 HNP (kritzer)  . ANTERIOR CERVICAL DECOMP/DISCECTOMY FUSION N/A 03/15/2013   Procedure: ANTERIOR CERVICAL DECOMPRESSION/DISCECTOMY FUSION 2 LEVELS;  Surgeon: Faythe Ghee, MD;  Location: [facility];  Service: Neurosurgery;  Laterality: N/A;  Cervical three-four, cervical four-five anterior cervical diskectomy fusion with a trabecular metal plus plate   . CARDIAC CATHETERIZATION  2006   stent placed  . CARDIAC CATHETERIZATION N/A 07/08/2016   Procedure: Left Heart Cath and Coronary Angiography;  Surgeon: Sherren Mocha, MD;  Location: [facility];  Service: Cardiovascular;  Laterality: N/A;  . COLONOSCOPY  multiple   diverticulosis, int hem, h/o adenomatous polyps [name])  . COLONOSCOPY  07/2014   severe diverticulosis, no  rpt needed [name])  . CORONARY STENT PLACEMENT  2006   drug-eluting  . ESOPHAGOGASTRODUODENOSCOPY  11/26/10   mild gastritis, duodenitis, duodenal stricture (Dr. [name])  . ESOPHAGOGASTRODUODENOSCOPY  10//2015   duodenal stricture, ?recurrent ulcer [name])  . HERNIA REPAIR Right 2006   Dr. Hassell Done  . KNEE SURGERY     left  . SHOULDER SURGERY     right  . spinal cyst removal    . WRIST SURGERY      Current Medications: Current Outpatient Prescriptions  Medication Sig Dispense Refill  . aspirin EC 81 MG tablet Take 81 mg by mouth daily.    . Cholecalciferol (VITAMIN D3) 5000 UNITS TABS Take 5,000 Units by mouth daily.    . diphenhydramine-acetaminophen (TYLENOL PM) 25-500 MG TABS tablet Take 2 tablets by mouth at bedtime as needed (sleep).    . lovastatin (MEVACOR) 40 MG tablet Take 40 mg by mouth at bedtime.    . pantoprazole (PROTONIX) 40 MG tablet Take 1 tablet (40 mg total) by mouth daily. 30 tablet 11  . ramipril (ALTACE) 5 MG capsule Take 5 mg by mouth 2 (two) times daily.    . vitamin B-12 (CYANOCOBALAMIN) 1000 MCG tablet Take 1,000 mcg by mouth daily.     No current facility-administered medications for this visit.      Allergies:   Codeine; Lipitor [atorvastatin]; Morphine; and Simvastatin   Social History   Social History  . Marital status: Married    Spouse name: N/A  . Number of children: 3  . Years of education: N/A   Occupational History  . 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 Smoking status: Former Smoker    Packs/day: 1.50    Years: 3.00    Quit date: 02/16/1985  . Smokeless tobacco: Never Used  . Alcohol use 0.0 oz/week     Comment: two to three times a year  . Drug use: No  . Sexual activity: No     Comment: last few years.   Other Topics Concern  . None   Social History Narrative   Daily caffeine use 3 per day   Lives with wife    Activity: no regular  exercise   Diet: tries to avoid sweets, some water, fruits/vegetables occasionally     Family History:  The patient's family history includes Arthritis in his sister; Cancer in his sister; Diabetes in his father and sister; Heart disease in his father; Heart failure in his father; Hypertension in his sister and sister; Kidney disease in his father; Ovarian cancer in his other.   ROS:   Please see the history of present illness.  All other systems are reviewed and otherwise negative.    PHYSICAL EXAM:   VS:  BP (!) 104/56   Pulse 70   Ht 6\' 2"  (1.88 m)   Wt 253 lb 12.8 oz (115.1 kg)  SpO2 97%   BMI 32.59 kg/m   BMI: Body mass index is 32.59 kg/m. GEN: Well nourished, well developed obese WM in no acute distress  HEENT: normocephalic, atraumatic Neck: no JVD, carotid bruits, or masses Cardiac: RRR; no murmurs, rubs, or gallops, no edema  Respiratory:  clear to auscultation bilaterally, normal work of breathing GI: soft, nontender, nondistended, + BS MS: right arm congenitally small, otherwise no acute change Skin: warm and dry, no rash, right groin cath site without hematoma, ecchymosis, or bruit. Neuro:  Alert and Oriented x 3, Strength and sensation are intact, follows commands Psych: euthymic mood, full affect  Wt Readings from Last 3 Encounters:  07/20/16 253 lb 12.8 oz (115.1 kg)  07/16/16 254 lb 4 oz (115.3 kg)  07/07/16 250 lb 12.8 oz (113.8 kg)      Studies/Labs Reviewed:   EKG:   EKG was not ordered today.  Recent Labs: 02/20/2016: ALT 14 07/08/2016: Hemoglobin 12.7; Platelets 215 07/10/2016: BUN 21; Creat 1.80; Potassium 4.2; Sodium 138   Lipid Panel    Component Value Date/Time   CHOL 98 07/08/2016 0256   TRIG 59 07/08/2016 0256   HDL 28 (L) 07/08/2016 0256   CHOLHDL 3.5 07/08/2016 0256   VLDL 12 07/08/2016 0256   LDLCALC 58 07/08/2016 0256    Additional studies/ records that were reviewed today include: Summarized above.    ASSESSMENT & PLAN:    1. CAD - stable by recent cath. Improved with addition of PPI, but he did notice a recurrent episode 1 hour after a very fatty meal. Agree with his decision to f/u with GI. Continue ASA as tolerated. He stopped BB due to headache. BP is too soft to consider addition of a different agent today. 2. Hypertension - controlled on ramipril alone. BP running slightly softer today but no acute sx related to this - c/w baseline BP. 3. Hyperlipidemia - recent LDL controlled. Continue lovastatin. Intolerant of more potent statins. 4. CKD stage III - recheck BMET today since he has resumed ramipril.  Disposition: F/u with Dr. Burt Knack as scheduled.    Medication Adjustments/Labs and Tests Ordered: Current medicines are reviewed at length with the patient today.  Concerns regarding medicines are outlined above. Medication changes, Labs and Tests ordered today are summarized above and listed in the Patient Instructions accessible in Encounters.   Raechel Ache PA-C  07/20/2016 8:19 AM    [address], [place], [place]  [address] Phone: [phone]; Fax: [phone]

## 2016-07-20 ENCOUNTER — Ambulatory Visit (INDEPENDENT_AMBULATORY_CARE_PROVIDER_SITE_OTHER): Payer: PPO | Admitting: Physician Assistant

## 2016-07-20 ENCOUNTER — Encounter: Payer: Self-pay | Admitting: Physician Assistant

## 2016-07-20 VITALS — BP 104/56 | HR 70 | Ht 74.0 in | Wt 253.8 lb

## 2016-07-20 DIAGNOSIS — N183 Chronic kidney disease, stage 3 unspecified: Secondary | ICD-10-CM

## 2016-07-20 DIAGNOSIS — I2510 Atherosclerotic heart disease of native coronary artery without angina pectoris: Secondary | ICD-10-CM | POA: Diagnosis not present

## 2016-07-20 DIAGNOSIS — E785 Hyperlipidemia, unspecified: Secondary | ICD-10-CM | POA: Diagnosis not present

## 2016-07-20 DIAGNOSIS — I10 Essential (primary) hypertension: Secondary | ICD-10-CM | POA: Diagnosis not present

## 2016-07-20 DIAGNOSIS — I25119 Atherosclerotic heart disease of native coronary artery with unspecified angina pectoris: Secondary | ICD-10-CM

## 2016-07-20 LAB — BASIC METABOLIC PANEL
BUN: 27 mg/dL — ABNORMAL HIGH (ref 7–25)
CO2: 27 mmol/L (ref 20–31)
Calcium: 8.7 mg/dL (ref 8.6–10.3)
Chloride: 103 mmol/L (ref 98–110)
Creat: 1.84 mg/dL — ABNORMAL HIGH (ref 0.70–1.18)
Glucose, Bld: 109 mg/dL — ABNORMAL HIGH (ref 65–99)
Potassium: 4.4 mmol/L (ref 3.5–5.3)
Sodium: 137 mmol/L (ref 135–146)

## 2016-07-20 NOTE — Patient Instructions (Signed)
**Note De-Identified Luis Sweeney Obfuscation** Medication Instructions:  Same-No changes  Labwork: Today-BMET  Testing/Procedures: None  Follow-Up: As planned     If you need a refill on your cardiac medications before your next appointment, please call your pharmacy.

## 2016-07-20 NOTE — Telephone Encounter (Signed)
Patient notified. He said he will stay on the protonix for a few weeks and see if there is any improvement and if not, he will call back for a GI referral.

## 2016-08-27 ENCOUNTER — Ambulatory Visit: Payer: PPO | Admitting: Family Medicine

## 2016-09-04 ENCOUNTER — Other Ambulatory Visit: Payer: Self-pay | Admitting: Family Medicine

## 2016-09-07 NOTE — Telephone Encounter (Signed)
Refill sent per [facility] refill protocol/SLS  

## 2016-11-10 DIAGNOSIS — M7542 Impingement syndrome of left shoulder: Secondary | ICD-10-CM | POA: Diagnosis not present

## 2016-12-01 ENCOUNTER — Other Ambulatory Visit: Payer: Self-pay | Admitting: Family Medicine

## 2016-12-02 ENCOUNTER — Other Ambulatory Visit: Payer: Self-pay | Admitting: *Deleted

## 2016-12-02 MED ORDER — PANTOPRAZOLE SODIUM 40 MG PO TBEC: 40.0000 mg | DELAYED_RELEASE_TABLET | Freq: Every day | ORAL | 2 refills | Status: DC

## 2016-12-05 ENCOUNTER — Other Ambulatory Visit: Payer: Self-pay | Admitting: Family Medicine

## 2017-01-25 ENCOUNTER — Other Ambulatory Visit: Payer: Self-pay | Admitting: Cardiovascular Disease

## 2017-02-23 DIAGNOSIS — M7542 Impingement syndrome of left shoulder: Secondary | ICD-10-CM | POA: Diagnosis not present

## 2017-02-27 ENCOUNTER — Other Ambulatory Visit: Payer: Self-pay | Admitting: Family Medicine

## 2017-02-27 DIAGNOSIS — D649 Anemia, unspecified: Secondary | ICD-10-CM

## 2017-02-27 DIAGNOSIS — Z125 Encounter for screening for malignant neoplasm of prostate: Secondary | ICD-10-CM

## 2017-02-27 DIAGNOSIS — N183 Chronic kidney disease, stage 3 unspecified: Secondary | ICD-10-CM

## 2017-02-27 DIAGNOSIS — E119 Type 2 diabetes mellitus without complications: Secondary | ICD-10-CM

## 2017-02-27 DIAGNOSIS — K760 Fatty (change of) liver, not elsewhere classified: Secondary | ICD-10-CM

## 2017-02-27 DIAGNOSIS — E538 Deficiency of other specified B group vitamins: Secondary | ICD-10-CM

## 2017-02-27 DIAGNOSIS — E785 Hyperlipidemia, unspecified: Secondary | ICD-10-CM

## 2017-03-01 ENCOUNTER — Other Ambulatory Visit (INDEPENDENT_AMBULATORY_CARE_PROVIDER_SITE_OTHER): Payer: PPO

## 2017-03-01 ENCOUNTER — Telehealth: Payer: Self-pay | Admitting: Radiology

## 2017-03-01 DIAGNOSIS — E119 Type 2 diabetes mellitus without complications: Secondary | ICD-10-CM | POA: Diagnosis not present

## 2017-03-01 DIAGNOSIS — N183 Chronic kidney disease, stage 3 unspecified: Secondary | ICD-10-CM

## 2017-03-01 DIAGNOSIS — E538 Deficiency of other specified B group vitamins: Secondary | ICD-10-CM | POA: Diagnosis not present

## 2017-03-01 DIAGNOSIS — Z125 Encounter for screening for malignant neoplasm of prostate: Secondary | ICD-10-CM | POA: Diagnosis not present

## 2017-03-01 DIAGNOSIS — D649 Anemia, unspecified: Secondary | ICD-10-CM | POA: Diagnosis not present

## 2017-03-01 DIAGNOSIS — E785 Hyperlipidemia, unspecified: Secondary | ICD-10-CM

## 2017-03-01 DIAGNOSIS — K760 Fatty (change of) liver, not elsewhere classified: Secondary | ICD-10-CM | POA: Diagnosis not present

## 2017-03-01 LAB — CBC WITH DIFFERENTIAL/PLATELET
Basophils Absolute: 0 10*3/uL (ref 0.0–0.1)
Basophils Relative: 0.2 % (ref 0.0–3.0)
Eosinophils Absolute: 0 10*3/uL (ref 0.0–0.7)
Eosinophils Relative: 0.2 % (ref 0.0–5.0)
HCT: 43.8 % (ref 39.0–52.0)
Hemoglobin: 14.5 g/dL (ref 13.0–17.0)
Lymphocytes Relative: 19.7 % (ref 12.0–46.0)
Lymphs Abs: 2 10*3/uL (ref 0.7–4.0)
MCHC: 33 g/dL (ref 30.0–36.0)
MCV: 92.1 fl (ref 78.0–100.0)
Monocytes Absolute: 0.9 10*3/uL (ref 0.1–1.0)
Monocytes Relative: 8.9 % (ref 3.0–12.0)
Neutro Abs: 7.4 10*3/uL (ref 1.4–7.7)
Neutrophils Relative %: 71 % (ref 43.0–77.0)
Platelets: 266 10*3/uL (ref 150.0–400.0)
RBC: 4.76 Mil/uL (ref 4.22–5.81)
RDW: 14.3 % (ref 11.5–15.5)
WBC: 10.4 10*3/uL (ref 4.0–10.5)

## 2017-03-01 LAB — COMPREHENSIVE METABOLIC PANEL
ALT: 12 U/L (ref 0–53)
AST: 15 U/L (ref 0–37)
Albumin: 4.1 g/dL (ref 3.5–5.2)
Alkaline Phosphatase: 26 U/L — ABNORMAL LOW (ref 39–117)
BUN: 32 mg/dL — ABNORMAL HIGH (ref 6–23)
CO2: 31 mEq/L (ref 19–32)
Calcium: 9.4 mg/dL (ref 8.4–10.5)
Chloride: 102 mEq/L (ref 96–112)
Creatinine, Ser: 1.79 mg/dL — ABNORMAL HIGH (ref 0.40–1.50)
GFR: 39.4 mL/min — ABNORMAL LOW (ref >60.00)
Glucose, Bld: 92 mg/dL (ref 70–99)
Potassium: 4.7 mEq/L (ref 3.5–5.1)
Sodium: 138 mEq/L (ref 135–145)
Total Bilirubin: 0.4 mg/dL (ref 0.2–1.2)
Total Protein: 6.2 g/dL (ref 6.0–8.3)

## 2017-03-01 LAB — LIPID PANEL
Cholesterol: 121 mg/dL (ref 0–200)
HDL: 44.7 mg/dL (ref >39.00)
LDL Cholesterol: 67 mg/dL (ref 0–99)
NonHDL: 76.51
Total CHOL/HDL Ratio: 3
Triglycerides: 48 mg/dL (ref 0.0–149.0)
VLDL: 9.6 mg/dL (ref 0.0–40.0)

## 2017-03-01 LAB — IBC PANEL
Iron: 83 ug/dL (ref 42–165)
Saturation Ratios: 26.8 % (ref 20.0–50.0)
Transferrin: 221 mg/dL (ref 212.0–360.0)

## 2017-03-01 LAB — VITAMIN B12: Vitamin B-12: 364 pg/mL (ref 211–911)

## 2017-03-01 LAB — VITAMIN D 25 HYDROXY (VIT D DEFICIENCY, FRACTURES): VITD: 107.27 ng/mL (ref 30.00–100.00)

## 2017-03-01 LAB — PSA, MEDICARE: PSA: 1 ng/ml (ref 0.10–4.00)

## 2017-03-01 LAB — HEMOGLOBIN A1C: Hgb A1c MFr Bld: 6.9 % — ABNORMAL HIGH (ref 4.6–6.5)

## 2017-03-01 LAB — FERRITIN: Ferritin: 104.8 ng/mL (ref 22.0–322.0)

## 2017-03-01 NOTE — Telephone Encounter (Signed)
Noted  

## 2017-03-01 NOTE — Telephone Encounter (Signed)
[facility] called critical results, Vit D - 107.27. Results given to Dr Danise Mina

## 2017-03-02 LAB — PARATHYROID HORMONE, INTACT (NO CA): PTH: 33 pg/mL (ref 14–64)

## 2017-03-03 ENCOUNTER — Ambulatory Visit (INDEPENDENT_AMBULATORY_CARE_PROVIDER_SITE_OTHER): Payer: PPO | Admitting: Family Medicine

## 2017-03-03 ENCOUNTER — Encounter: Payer: Self-pay | Admitting: Family Medicine

## 2017-03-03 DIAGNOSIS — Z0000 Encounter for general adult medical examination without abnormal findings: Secondary | ICD-10-CM | POA: Diagnosis not present

## 2017-03-03 DIAGNOSIS — I25119 Atherosclerotic heart disease of native coronary artery with unspecified angina pectoris: Secondary | ICD-10-CM

## 2017-03-03 DIAGNOSIS — E785 Hyperlipidemia, unspecified: Secondary | ICD-10-CM

## 2017-03-03 DIAGNOSIS — E669 Obesity, unspecified: Secondary | ICD-10-CM

## 2017-03-03 DIAGNOSIS — E538 Deficiency of other specified B group vitamins: Secondary | ICD-10-CM

## 2017-03-03 DIAGNOSIS — Z7189 Other specified counseling: Secondary | ICD-10-CM | POA: Diagnosis not present

## 2017-03-03 DIAGNOSIS — E119 Type 2 diabetes mellitus without complications: Secondary | ICD-10-CM

## 2017-03-03 DIAGNOSIS — K219 Gastro-esophageal reflux disease without esophagitis: Secondary | ICD-10-CM | POA: Diagnosis not present

## 2017-03-03 DIAGNOSIS — N183 Chronic kidney disease, stage 3 unspecified: Secondary | ICD-10-CM

## 2017-03-03 MED ORDER — VITAMIN D3 25 MCG (1000 UT) PO CAPS: 1.0000 | ORAL_CAPSULE | Freq: Every day | ORAL | Status: DC

## 2017-03-03 NOTE — Assessment & Plan Note (Signed)
Chronic, stable. Continue to monitor yearly.

## 2017-03-03 NOTE — Assessment & Plan Note (Signed)
Discussed healthy diet and lifestyle changes to affect sustainable weight loss  

## 2017-03-03 NOTE — Assessment & Plan Note (Signed)
Chronic, stable. Continue lovastatin.

## 2017-03-03 NOTE — Assessment & Plan Note (Signed)
Chronic, stable. Foot exam today. Encouraged he schedule eye exam as due.

## 2017-03-03 NOTE — Assessment & Plan Note (Signed)
Advanced directives: doesn't have set up. Wouldn't want prolonged life support. Would want oldest daughter (Cindy Burgess RN) to be HCPOA.  

## 2017-03-03 NOTE — Assessment & Plan Note (Signed)
Preventative protocols reviewed and updated unless pt declined. Discussed healthy diet and lifestyle.  

## 2017-03-03 NOTE — Patient Instructions (Addendum)
You are doing well today. Return as needed or in 1 year for medicare wellness with Katha Cabal and f/u with me.  Decrease vitamin D to 1000 units daily.  Continue vitamin B12 1091mcg daily.  Schedule eye exam as you're due.  Hearing and vision screens today.   Health Maintenance, Male A healthy lifestyle and preventive care is important for your health and wellness. Ask your health care provider about what schedule of regular examinations is right for you. What should I know about weight and diet?  Eat a Healthy Diet  Eat plenty of vegetables, fruits, whole grains, low-fat dairy products, and lean protein.  Do not eat a lot of foods high in solid fats, added sugars, or salt. Maintain a Healthy Weight  Regular exercise can help you achieve or maintain a healthy weight. You should:  Do at least 150 minutes of exercise each week. The exercise should increase your heart rate and make you sweat (moderate-intensity exercise).  Do strength-training exercises at least twice a week. Watch Your Levels of Cholesterol and Blood Lipids  Have your blood tested for lipids and cholesterol every 5 years starting at 76 years of age. If you are at high risk for heart disease, you should start having your blood tested when you are 76 years old. You may need to have your cholesterol levels checked more often if:  Your lipid or cholesterol levels are high.  You are older than 76 years of age.  You are at high risk for heart disease. What should I know about cancer screening? Many types of cancers can be detected early and may often be prevented. Lung Cancer  You should be screened every year for lung cancer if:  You are a current smoker who has smoked for at least 30 years.  You are a former smoker who has quit within the past 15 years.  Talk to your health care provider about your screening options, when you should start screening, and how often you should be screened. Colorectal Cancer  Routine  colorectal cancer screening usually begins at 76 years of age and should be repeated every 5-10 years until you are 76 years old. You may need to be screened more often if early forms of precancerous polyps or small growths are found. Your health care provider may recommend screening at an earlier age if you have risk factors for colon cancer.  Your health care provider may recommend using home test kits to check for hidden blood in the stool.  A small camera at the end of a tube can be used to examine your colon (sigmoidoscopy or colonoscopy). This checks for the earliest forms of colorectal cancer. Prostate and Testicular Cancer  Depending on your age and overall health, your health care provider may do certain tests to screen for prostate and testicular cancer.  Talk to your health care provider about any symptoms or concerns you have about testicular or prostate cancer. Skin Cancer  Check your skin from head to toe regularly.  Tell your health care provider about any new moles or changes in moles, especially if:  There is a change in a mole's size, shape, or color.  You have a mole that is larger than a pencil eraser.  Always use sunscreen. Apply sunscreen liberally and repeat throughout the day.  Protect yourself by wearing long sleeves, pants, a wide-brimmed hat, and sunglasses when outside. What should I know about heart disease, diabetes, and high blood pressure?  If you are 18-39 years  of age, have your blood pressure checked every 3-5 years. If you are 37 years of age or older, have your blood pressure checked every year. You should have your blood pressure measured twice-once when you are at a hospital or clinic, and once when you are not at a hospital or clinic. Record the average of the two measurements. To check your blood pressure when you are not at a hospital or clinic, you can use:  An automated blood pressure machine at a pharmacy.  A home blood pressure  monitor.  Talk to your health care provider about your target blood pressure.  If you are between 74-59 years old, ask your health care provider if you should take aspirin to prevent heart disease.  Have regular diabetes screenings by checking your fasting blood sugar level.  If you are at a normal weight and have a low risk for diabetes, have this test once every three years after the age of 61.  If you are overweight and have a high risk for diabetes, consider being tested at a younger age or more often.  A one-time screening for abdominal aortic aneurysm (AAA) by ultrasound is recommended for men aged 21-75 years who are current or former smokers. What should I know about preventing infection? Hepatitis B  If you have a higher risk for hepatitis B, you should be screened for this virus. Talk with your health care provider to find out if you are at risk for hepatitis B infection. Hepatitis C  Blood testing is recommended for:  Everyone born from [birth year] through 1965.  Anyone with known risk factors for hepatitis C. Sexually Transmitted Diseases (STDs)  You should be screened each year for STDs including gonorrhea and chlamydia if:  You are sexually active and are younger than 76 years of age.  You are older than 76 years of age and your health care provider tells you that you are at risk for this type of infection.  Your sexual activity has changed since you were last screened and you are at an increased risk for chlamydia or gonorrhea. Ask your health care provider if you are at risk.  Talk with your health care provider about whether you are at high risk of being infected with HIV. Your health care provider may recommend a prescription medicine to help prevent HIV infection. What else can I do?  Schedule regular health, dental, and eye exams.  Stay current with your vaccines (immunizations).  Do not use any tobacco products, such as cigarettes, chewing tobacco, and  e-cigarettes. If you need help quitting, ask your health care provider.  Limit alcohol intake to no more than 2 drinks per day. One drink equals 12 ounces of beer, 5 ounces of wine, or 1 ounces of hard liquor.  Do not use street drugs.  Do not share needles.  Ask your health care provider for help if you need support or information about quitting drugs.  Tell your health care provider if you often feel depressed.  Tell your health care provider if you have ever been abused or do not feel safe at home. This information is not intended to replace advice given to you by your health care provider. Make sure you discuss any questions you have with your health care provider. Document Released: 03/26/2008 Document Revised: 05/27/2016 Document Reviewed: 07/02/2015 Elsevier Interactive Patient Education  2017 [employer].

## 2017-03-03 NOTE — Assessment & Plan Note (Addendum)

## 2017-03-03 NOTE — Assessment & Plan Note (Signed)
Stable on daily protonix.

## 2017-03-03 NOTE — Progress Notes (Signed)
BP 110/68   Pulse 60   Temp 98.1 F (36.7 C)   Ht 6\' 2"  (1.88 m)   Wt 250 lb 8 oz (113.6 kg)   SpO2 97%   BMI 32.16 kg/m    CC: CPE Subjective:    Patient ID: Luis Sweeney, male    DOB: 1941/05/26, 76 y.o.   MRN: [id number]  HPI: Luis Sweeney is a 76 y.o. male presenting on 03/03/2017 for Annual Exam   Has not seen Katha Cabal this year.  Did not receive medicare wellness packet.  Care team reviewed.   L shoulder pain - seeing ortho for this. Has received steroid shot x2 by ortho but pt declined. Currently on nabumetone. Prednisone was not tolerated well.   Preventative: ESOPHAGOGASTRODUODENOSCOPY Date: 10//2015 duodenal stricture, ?recurrent ulcer [name])  COLONOSCOPY Date: 07/2014 severe diverticulosis, no rpt needed [name]) Prostate cancer screening - last checked 05/2013. No problems in past. Nocturia x1 per night. Strong stream. Agrees to space out.  Flu - not received this year. Pneumovax 2004, 2009. prevnar 2016 Td - 05/2012 Shingrix - declines Advanced directives: doesn't have set up. Wouldn't want prolonged life support.Would want oldest daughter Luis Cook RN) to be [employer]. Will work on this. Seat belt use discussed Sunscreen use discussed. No changing moles on skin. Sees derm infrequently.  Ex smoker - quit remotely Alcohol - none  Daily caffeine use 3 per day  Lives with wife  Activity: no regular exercise  Diet: tries to avoid sweets, some water, fruits/vegetables occasionally   Relevant past medical, surgical, family and social history reviewed and updated as indicated. Interim medical history since our last visit reviewed. Allergies and medications reviewed and updated. Outpatient Medications Prior to Visit  Medication Sig Dispense Refill  . aspirin EC 81 MG tablet Take 81 mg by mouth daily.    . diphenhydramine-acetaminophen (TYLENOL PM) 25-500 MG TABS tablet Take 2 tablets by mouth at bedtime as needed (sleep).    . lovastatin (MEVACOR) 40 MG  tablet TAKE 1 TABLET BY MOUTH DAILY AT BEDTIME 90 tablet 1  . pantoprazole (PROTONIX) 40 MG tablet Take 1 tablet (40 mg total) by mouth daily. 90 tablet 2  . ramipril (ALTACE) 5 MG capsule TAKE 1 CAPSULE BY MOUTH TWICE DAILY 180 capsule 0  . vitamin B-12 (CYANOCOBALAMIN) 1000 MCG tablet Take 1,000 mcg by mouth daily.    . Cholecalciferol (VITAMIN D3) 5000 UNITS TABS Take 5,000 Units by mouth daily.     No facility-administered medications prior to visit.      Per HPI unless specifically indicated in ROS section below Review of Systems  Constitutional: Negative for activity change, appetite change, chills, fatigue, fever and unexpected weight change.  HENT: Negative for hearing loss.   Eyes: Negative for visual disturbance.  Respiratory: Negative for cough, chest tightness, shortness of breath and wheezing.   Cardiovascular: Negative for chest pain, palpitations and leg swelling.  Gastrointestinal: Negative for abdominal distention, abdominal pain, blood in stool, constipation, diarrhea, nausea and vomiting.  Genitourinary: Negative for difficulty urinating and hematuria.  Musculoskeletal: Negative for arthralgias, myalgias and neck pain.  Skin: Negative for rash.  Neurological: Negative for dizziness, seizures, syncope and headaches.  Hematological: Negative for adenopathy. Does not bruise/bleed easily.  Psychiatric/Behavioral: Negative for dysphoric mood. The patient is not nervous/anxious.        Objective:    BP 110/68   Pulse 60   Temp 98.1 F (36.7 C)   Ht 6\' 2"  (1.88 m)  Wt 250 lb 8 oz (113.6 kg)   SpO2 97%   BMI 32.16 kg/m   Wt Readings from Last 3 Encounters:  03/03/17 250 lb 8 oz (113.6 kg)  07/20/16 253 lb 12.8 oz (115.1 kg)  07/16/16 254 lb 4 oz (115.3 kg)    Physical Exam  Constitutional: He is oriented to person, place, and time. He appears well-developed and well-nourished. No distress.  HENT:  Head: Normocephalic and atraumatic.  Right Ear: Hearing,  tympanic membrane, external ear and ear canal normal.  Left Ear: Hearing, tympanic membrane, external ear and ear canal normal.  Nose: Nose normal.  Mouth/Throat: Uvula is midline, oropharynx is clear and moist and mucous membranes are normal. No oropharyngeal exudate, posterior oropharyngeal edema or posterior oropharyngeal erythema.  Eyes: Conjunctivae and EOM are normal. Pupils are equal, round, and reactive to light. No scleral icterus.  Neck: Normal range of motion. Neck supple. Carotid bruit is not present. No thyromegaly present.  Cardiovascular: Normal rate, regular rhythm, normal heart sounds and intact distal pulses.   No murmur heard. Pulses:      Radial pulses are 2+ on the right side, and 2+ on the left side.  Pulmonary/Chest: Effort normal and breath sounds normal. No respiratory distress. He has no wheezes. He has no rales.  Abdominal: Soft. Bowel sounds are normal. He exhibits no distension and no mass. There is no tenderness. There is no rebound and no guarding.  Musculoskeletal: Normal range of motion. He exhibits no edema.  Lymphadenopathy:    He has no cervical adenopathy.  Neurological: He is alert and oriented to person, place, and time.  CN grossly intact, station and gait intact Recall 3/3 Calculation 5/5 serial 3s  Skin: Skin is warm and dry. No rash noted.  Psychiatric: He has a normal mood and affect. His behavior is normal. Judgment and thought content normal.  Nursing note and vitals reviewed.  Results for orders placed or performed in visit on 03/01/17  Lipid panel  Result Value Ref Range   Cholesterol 121 0 - 200 mg/dL   Triglycerides 48.0 0.0 - 149.0 mg/dL   HDL 44.70 >39.00 mg/dL   VLDL 9.6 0.0 - 40.0 mg/dL   LDL Cholesterol 67 0 - 99 mg/dL   Total CHOL/HDL Ratio 3    NonHDL 76.51   CBC with Differential/Platelet  Result Value Ref Range   WBC 10.4 4.0 - 10.5 K/uL   RBC 4.76 4.22 - 5.81 Mil/uL   Hemoglobin 14.5 13.0 - 17.0 g/dL   HCT 43.8 39.0 -  52.0 %   MCV 92.1 78.0 - 100.0 fl   MCHC 33.0 30.0 - 36.0 g/dL   RDW 14.3 11.5 - 15.5 %   Platelets 266.0 150.0 - 400.0 K/uL   Neutrophils Relative % 71.0 43.0 - 77.0 %   Lymphocytes Relative 19.7 12.0 - 46.0 %   Monocytes Relative 8.9 3.0 - 12.0 %   Eosinophils Relative 0.2 0.0 - 5.0 %   Basophils Relative 0.2 0.0 - 3.0 %   Neutro Abs 7.4 1.4 - 7.7 K/uL   Lymphs Abs 2.0 0.7 - 4.0 K/uL   Monocytes Absolute 0.9 0.1 - 1.0 K/uL   Eosinophils Absolute 0.0 0.0 - 0.7 K/uL   Basophils Absolute 0.0 0.0 - 0.1 K/uL  Hemoglobin A1c  Result Value Ref Range   Hgb A1c MFr Bld 6.9 (H) 4.6 - 6.5 %  PSA, Medicare  Result Value Ref Range   PSA 1.00 0.10 - 4.00 ng/ml  VITAMIN  D 25 Hydroxy (Vit-D Deficiency, Fractures)  Result Value Ref Range   VITD 107.27 (HH) 30.00 - 100.00 ng/mL  Comprehensive metabolic panel  Result Value Ref Range   Sodium 138 135 - 145 mEq/L   Potassium 4.7 3.5 - 5.1 mEq/L   Chloride 102 96 - 112 mEq/L   CO2 31 19 - 32 mEq/L   Glucose, Bld 92 70 - 99 mg/dL   BUN 32 (H) 6 - 23 mg/dL   Creatinine, Ser 1.79 (H) 0.40 - 1.50 mg/dL   Total Bilirubin 0.4 0.2 - 1.2 mg/dL   Alkaline Phosphatase 26 (L) 39 - 117 U/L   AST 15 0 - 37 U/L   ALT 12 0 - 53 U/L   Total Protein 6.2 6.0 - 8.3 g/dL   Albumin 4.1 3.5 - 5.2 g/dL   Calcium 9.4 8.4 - 10.5 mg/dL   GFR 39.40 (L) >60.00 mL/min  Parathyroid hormone, intact (no Ca)  Result Value Ref Range   PTH 33 14 - 64 pg/mL  Vitamin B12  Result Value Ref Range   Vitamin B-12 364 211 - 911 pg/mL  IBC panel  Result Value Ref Range   Iron 83 42 - 165 ug/dL   Transferrin 221.0 212.0 - 360.0 mg/dL   Saturation Ratios 26.8 20.0 - 50.0 %  Ferritin  Result Value Ref Range   Ferritin 104.8 22.0 - 322.0 ng/mL      Assessment & Plan:   Problem List Items Addressed This Visit    Advanced care planning/counseling discussion    Advanced directives: doesn't have set up. Wouldn't want prolonged life support.Would want oldest daughter Luis Cook RN) to be [employer].       B12 deficiency    Levels trending down. Continue oral replacement, recheck in 1 year.       CKD (chronic kidney disease), stage III (Chronic)    Chronic, stable. Continue to monitor yearly.      Coronary atherosclerosis of native coronary artery (Chronic)    Asxs. Sees cards. Continue aspirin, statin.      Diet-controlled diabetes mellitus (HCC)    Chronic, stable. Foot exam today. Encouraged he schedule eye exam as due.       GERD    Stable on daily protonix.       Health maintenance examination    Preventative protocols reviewed and updated unless pt declined. Discussed healthy diet and lifestyle.       Hyperlipidemia    Chronic, stable. Continue lovastatin.       Medicare annual wellness visit, subsequent    I have personally reviewed the Medicare Annual Wellness questionnaire and have noted 1. The patient's medical and social history 2. Their use of alcohol, tobacco or illicit drugs 3. Their current medications and supplements 4. The patient's functional ability including ADL's, fall risks, home safety risks and hearing or visual impairment. Cognitive function has been assessed and addressed as indicated.  5. Diet and physical activity 6. Evidence for depression or mood disorders The patients weight, height, BMI have been recorded in the chart. I have made referrals, counseling and provided education to the patient based on review of the above and I have provided the pt with a written personalized care plan for preventive services. Provider list updated.. See scanned questionairre as needed for further documentation. Reviewed preventative protocols and updated unless pt declined.       Obesity, Class I, BMI 30-34.9    Discussed healthy diet and lifestyle changes to affect sustainable  weight loss.           Follow up plan: Return in about 1 year (around 03/03/2018) for annual exam, prior fasting for blood work, medicare wellness  visit.  Ria Bush, MD

## 2017-03-03 NOTE — Assessment & Plan Note (Signed)
Levels trending down. Continue oral replacement, recheck in 1 year.

## 2017-03-03 NOTE — Assessment & Plan Note (Signed)
Asxs. Sees cards. Continue aspirin, statin.

## 2017-04-01 DIAGNOSIS — M7542 Impingement syndrome of left shoulder: Secondary | ICD-10-CM | POA: Diagnosis not present

## 2017-04-08 DIAGNOSIS — M7542 Impingement syndrome of left shoulder: Secondary | ICD-10-CM | POA: Diagnosis not present

## 2017-04-15 DIAGNOSIS — M7542 Impingement syndrome of left shoulder: Secondary | ICD-10-CM | POA: Diagnosis not present

## 2017-04-19 ENCOUNTER — Other Ambulatory Visit: Payer: Self-pay | Admitting: Cardiovascular Disease

## 2017-04-21 ENCOUNTER — Telehealth: Payer: Self-pay | Admitting: Cardiovascular Disease

## 2017-04-21 NOTE — Telephone Encounter (Signed)
New message    Pt is calling asking if faxed was received about surgical clearance.

## 2017-04-22 NOTE — Telephone Encounter (Signed)
Surgical clearance form reviewed by Dr Burt Knack and pt cleared.  Form faxed to [facility]. Pt aware.

## 2017-05-11 DIAGNOSIS — M7542 Impingement syndrome of left shoulder: Secondary | ICD-10-CM | POA: Diagnosis not present

## 2017-05-11 DIAGNOSIS — M75122 Complete rotator cuff tear or rupture of left shoulder, not specified as traumatic: Secondary | ICD-10-CM | POA: Diagnosis not present

## 2017-05-11 DIAGNOSIS — M75112 Incomplete rotator cuff tear or rupture of left shoulder, not specified as traumatic: Secondary | ICD-10-CM | POA: Diagnosis not present

## 2017-06-15 ENCOUNTER — Other Ambulatory Visit: Payer: Self-pay | Admitting: Family Medicine

## 2017-07-14 ENCOUNTER — Encounter: Payer: Self-pay | Admitting: Cardiovascular Disease

## 2017-07-14 ENCOUNTER — Ambulatory Visit (INDEPENDENT_AMBULATORY_CARE_PROVIDER_SITE_OTHER): Payer: PPO | Admitting: Cardiovascular Disease

## 2017-07-14 VITALS — BP 144/78 | HR 72 | Ht 74.0 in | Wt 253.8 lb

## 2017-07-14 DIAGNOSIS — I2510 Atherosclerotic heart disease of native coronary artery without angina pectoris: Secondary | ICD-10-CM | POA: Diagnosis not present

## 2017-07-14 DIAGNOSIS — E785 Hyperlipidemia, unspecified: Secondary | ICD-10-CM | POA: Diagnosis not present

## 2017-07-14 DIAGNOSIS — N183 Chronic kidney disease, stage 3 unspecified: Secondary | ICD-10-CM

## 2017-07-14 NOTE — Progress Notes (Signed)
Cardiology Office Note Date:  07/14/2017   ID:  Sweeney, Luis [birth year], MRN [id number]  PCP:  Luis Bush, MD  Cardiologist:  Luis Mocha, MD    Chief Complaint  Patient presents with  . Follow-up     History of Present Illness: Luis Sweeney is a 76 y.o. male who presents for follow-up of CAD. The patient underwent DES implantation in the LAD in 2004. He underwent repeat cath in 2017 demonstrating continued stent patency and nonobstructive CAD elsewhere. Other medical problems include HTN, CKD 3, NASH, and hyperlipidemia.   He's here alone today. He had shoulder surgery about one month ago. He's recovering well. He didn't have any cardiac problems with surgery. He denies chest pain, shortness of breath, or leg swelling. Reports no recent change in medications.   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 He goes in a works a little but is home much more as his wife has developed some medical problems over the past few years. 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   Past Medical History:  Diagnosis Date  . Acute duodenal ulcer with hemorrhage but without obstruction    required  bld. transfusion  . Anemia, iron deficiency   . Arthritis    OA- pt. reports its. minor  . CAD (coronary artery disease) 2004   a. s/p MI treated with drug-eluting stent placement to the proximal left anterior descending in late 2004  . CKD (chronic kidney disease), stage III ([place])   . Diverticulosis of colon   . Duodenal stricture - recurrent 09/21/2014  . 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 GERD (gastroesophageal reflux disease)   . H/O hiatal hernia   . Helicobacter pylori gastritis    recurrent  . Hypercholesteremia   . Hyperglycemia   . Hypertension   . Myocardial infarct ([place])   . NAFLD (nonalcoholic fatty liver disease) 2015   with liver cysts  . Paralysis of upper limb (HCC)    R arm- birth injury  . Personal history of colonic polyps   . Urticaria    Lat left arm    Past Surgical History:  Procedure Laterality Date  . ANTERIOR CERVICAL DECOMP/DISCECTOMY FUSION  03/2013   C3/4, C4/5 HNP (kritzer)  . ANTERIOR CERVICAL DECOMP/DISCECTOMY FUSION N/A 03/15/2013   Procedure: ANTERIOR CERVICAL DECOMPRESSION/DISCECTOMY FUSION 2 LEVELS;  Surgeon: Faythe Ghee, MD;  Location: [facility];  Service: Neurosurgery;  Laterality: N/A;  Cervical three-four, cervical four-five anterior cervical diskectomy fusion with a trabecular metal plus plate   . CARDIAC CATHETERIZATION  2006   stent placed  . CARDIAC CATHETERIZATION N/A 07/08/2016   Procedure: Left Heart Cath and Coronary Angiography;  Surgeon: Luis Mocha, MD;  Location: [facility];  Service: Cardiovascular;  Laterality: N/A;  . COLONOSCOPY  multiple   diverticulosis, int hem, h/o adenomatous polyps [name])  . COLONOSCOPY  07/2014   severe diverticulosis, no rpt needed [name])  . CORONARY STENT PLACEMENT  2006   drug-eluting  . ESOPHAGOGASTRODUODENOSCOPY  11/26/10   mild gastritis, duodenitis, duodenal stricture (Dr. [name])  . ESOPHAGOGASTRODUODENOSCOPY  10//2015   duodenal stricture, ?recurrent ulcer [name])  . HERNIA REPAIR Right 2006   Dr. Hassell Done  . KNEE SURGERY     left  . SHOULDER SURGERY     right  .  spinal cyst removal    . WRIST SURGERY      Current Outpatient Prescriptions  Medication Sig Dispense Refill  . aspirin EC 81 MG tablet Take 81 mg by mouth daily.    . Cholecalciferol (VITAMIN D3) 1000 units CAPS Take 1 capsule (1,000 Units total) by mouth daily. 30 capsule   . diphenhydramine-acetaminophen (TYLENOL PM) 25-500 MG TABS tablet Take 2 tablets by mouth at bedtime as needed (sleep).    . lovastatin (MEVACOR) 40 MG tablet TAKE 1 TABLET BY MOUTH DAILY AT BEDTIME 90 tablet 1  . pantoprazole (PROTONIX) 40 MG tablet Take 1  tablet (40 mg total) by mouth daily. 90 tablet 2  . ramipril (ALTACE) 5 MG capsule Take 1 capsule (5 mg total) by mouth 2 (two) times daily. Please call and schedule a one year follow up appointment 180 capsule 0  . vitamin B-12 (CYANOCOBALAMIN) 1000 MCG tablet Take 1,000 mcg by mouth daily.     No current facility-administered medications for this visit.     Allergies:   Prednisone; Codeine; Lipitor [atorvastatin]; Morphine; and Simvastatin   Social History:  The patient  reports that he quit smoking about 32 years ago. He has a 4.50 pack-year smoking history. He has never used smokeless tobacco. He reports that he drinks alcohol. He reports that he does not use drugs.   Family History:  The patient's family history includes Arthritis in his sister; Cancer in his sister; Diabetes in his father and sister; Heart disease in his father; Heart failure in his father; Hypertension in his sister and sister; Kidney disease in his father; Ovarian cancer in his other.    ROS:  Please see the history of present illness.  Otherwise, review of systems is positive for easy bruising.  All other systems are reviewed and negative.    PHYSICAL EXAM: VS:  BP (!) 144/78   Pulse 72   Ht 6\' 2"  (1.88 m)   Wt 115.1 kg (253 lb 12.8 oz)   BMI 32.59 kg/m  , BMI Body mass index is 32.59 kg/m. GEN: Well nourished, well developed, in no acute distress  HEENT: normal  Neck: no JVD, no masses. No carotid bruits Cardiac: RRR without murmur or gallop                Respiratory:  clear to auscultation bilaterally, normal work of breathing GI: soft, nontender, nondistended, + BS MS: no deformity or atrophy  Ext: no pretibial edema, pedal pulses 2+= bilaterally Skin: warm and dry, no rash Neuro:  Strength and sensation are intact Psych: euthymic mood, full affect  EKG:  EKG is ordered today. The ekg ordered today shows NSR 72 bpm, occasional PVC's, nonspecific ST abnormality.  Recent Labs: 03/01/2017: ALT 12;  BUN 32; Creatinine, Ser 1.79; Hemoglobin 14.5; Platelets 266.0; Potassium 4.7; Sodium 138   Lipid Panel     Component Value Date/Time   CHOL 121 03/01/2017 0919   TRIG 48.0 03/01/2017 0919   HDL 44.70 03/01/2017 0919   CHOLHDL 3 03/01/2017 0919   VLDL 9.6 03/01/2017 0919   LDLCALC 67 03/01/2017 0919      Wt Readings from Last 3 Encounters:  07/14/17 115.1 kg (253 lb 12.8 oz)  03/03/17 113.6 kg (250 lb 8 oz)  07/20/16 115.1 kg (253 lb 12.8 oz)     Cardiac Studies Reviewed: Cardiac Cath 07-08-2016: Conclusion   1. Widely patent proximal LAD stent with only mild in-stent restenosis 2. Moderate stenosis of a very small OM branch  of the circumflex, otherwise widely patent circumflex 3. Angiographically normal right coronary artery  Suspect a noncardiac chest pain. Continue medical therapy for CAD.   Technical Details INDICATION: Chest pain, known CAD With hx of Cypher DES in 2004 (LAD)  PROCEDURAL DETAILS:  The right groin was prepped, draped, and anesthetized with 1% lidocaine. Using modified Seldinger technique, a 5 French sheath was introduced into the right femoral artery. Standard Judkins catheters were used for coronary angiography. Catheter exchanges were performed over an 0.035 guidewire. There were no immediate procedural complications. The patient was transferred to the post catheterization recovery area for further monitoring.     Estimated blood loss <50 mL. . During this procedure the patient was administered the following to achieve and maintain moderate conscious sedation: Versed 2 mg, Fentanyl 25 mcg, while the patient's heart rate, blood pressure, and oxygen saturation were continuously monitored.    Coronary Findings   Dominance: Right  Left Anterior Descending  Prox LAD to Mid LAD lesion, 30% stenosed. The lesion was previously treated using a drug eluting stent over 2 years ago.  First Diagonal Branch  Vessel is small in size.  Ost 1st Diag to 1st Diag  lesion, 90% stenosed. samll vessel, jailed from stent  Left Circumflex  First Obtuse Marginal Branch  Vessel is small in size.  Ost 1st Mrg to 1st Mrg lesion, 60% stenosed.  Right Coronary Artery  Vessel is angiographically normal.  Left Heart   Left Ventricle LV end diastolic pressure is normal.    Coronary Diagrams   Diagnostic Diagram          ASSESSMENT AND PLAN: 1.  CAD, native vessel, without angina: cath from last year reviewed with patient. Medications reviewed and will be continued without change.   2. HTN: continue ramipril.   3. CKD 3: stable. Most recent labs reviewed.   4. Hyperlipidemia: treated with lovastatin. Most recent lab April 2018 with chol 121, HDL 45, LDL 67 (at goal).   Current medicines are reviewed with the patient today.  The patient does not have concerns regarding medicines.  Labs/ tests ordered today include:   Orders Placed This Encounter  Procedures  . EKG 12-Lead    Disposition:   FU one year  Signed, Luis Mocha, MD  07/14/2017 1:39 PM    [facility] [address], [place], [place]  [address] Phone: [phone]; Fax: [phone]

## 2017-07-14 NOTE — Patient Instructions (Signed)

## 2017-07-19 ENCOUNTER — Other Ambulatory Visit: Payer: Self-pay | Admitting: Cardiovascular Disease

## 2017-07-21 DIAGNOSIS — D225 Melanocytic nevi of trunk: Secondary | ICD-10-CM | POA: Diagnosis not present

## 2017-07-21 DIAGNOSIS — I8393 Asymptomatic varicose veins of bilateral lower extremities: Secondary | ICD-10-CM | POA: Diagnosis not present

## 2017-07-21 DIAGNOSIS — L820 Inflamed seborrheic keratosis: Secondary | ICD-10-CM | POA: Diagnosis not present

## 2017-07-21 DIAGNOSIS — L570 Actinic keratosis: Secondary | ICD-10-CM | POA: Diagnosis not present

## 2017-07-21 DIAGNOSIS — L812 Freckles: Secondary | ICD-10-CM | POA: Diagnosis not present

## 2017-07-21 DIAGNOSIS — L821 Other seborrheic keratosis: Secondary | ICD-10-CM | POA: Diagnosis not present

## 2017-07-21 DIAGNOSIS — L700 Acne vulgaris: Secondary | ICD-10-CM | POA: Diagnosis not present

## 2017-07-21 DIAGNOSIS — Z1283 Encounter for screening for malignant neoplasm of skin: Secondary | ICD-10-CM | POA: Diagnosis not present

## 2017-07-21 DIAGNOSIS — R202 Paresthesia of skin: Secondary | ICD-10-CM | POA: Diagnosis not present

## 2017-08-06 ENCOUNTER — Encounter: Payer: Self-pay | Admitting: Family Medicine

## 2017-08-06 ENCOUNTER — Ambulatory Visit (INDEPENDENT_AMBULATORY_CARE_PROVIDER_SITE_OTHER): Payer: PPO | Admitting: Family Medicine

## 2017-08-06 VITALS — BP 130/66 | HR 99 | Temp 100.0°F | Wt 248.8 lb

## 2017-08-06 DIAGNOSIS — N3000 Acute cystitis without hematuria: Secondary | ICD-10-CM | POA: Diagnosis not present

## 2017-08-06 DIAGNOSIS — R300 Dysuria: Secondary | ICD-10-CM

## 2017-08-06 LAB — POC URINALSYSI DIPSTICK (AUTOMATED)
Bilirubin, UA: NEGATIVE
Blood, UA: NEGATIVE
Glucose, UA: NEGATIVE
Ketones, UA: NEGATIVE
Nitrite, UA: NEGATIVE
Protein, UA: NEGATIVE
Spec Grav, UA: 1.015 (ref 1.010–1.025)
Urobilinogen, UA: 0.2 E.U./dL
pH, UA: 6 (ref 5.0–8.0)

## 2017-08-06 MED ORDER — CIPROFLOXACIN HCL 500 MG PO TABS: 500.0000 mg | ORAL_TABLET | Freq: Two times a day (BID) | ORAL | 0 refills | Status: DC

## 2017-08-06 NOTE — Progress Notes (Signed)
BP 130/66 (BP Location: Left Arm, Patient Position: Sitting, Cuff Size: Large)   Pulse 99   Temp 100 F (37.8 C) (Oral)   Wt 248 lb 12 oz (112.8 kg)   SpO2 95%   BMI 31.94 kg/m    CC: "I think I have a kidney infection" Subjective:    Patient ID: Luis Sweeney, male    DOB: April 13, 1941, 76 y.o.   MRN: [id number]  HPI: Luis Sweeney is a 75 y.o. male presenting on 08/06/2017 for Nocturia (Started 08/04/17); Fever; and Dysuria   2d h/o dysuria, urgency, nocturia, frequency with incomplete emptying. Chills last night, feverish. No hematuria, flan pain, abdominal pain, nausea/vomiting. No rectal pain.  H/o UTI in the past - several years ago.  No recent antibiotic use.   Relevant past medical, surgical, family and social history reviewed and updated as indicated. Interim medical history since our last visit reviewed. Allergies and medications reviewed and updated. Outpatient Medications Prior to Visit  Medication Sig Dispense Refill  . aspirin EC 81 MG tablet Take 81 mg by mouth daily.    . Cholecalciferol (VITAMIN D3) 1000 units CAPS Take 1 capsule (1,000 Units total) by mouth daily. 30 capsule   . diphenhydramine-acetaminophen (TYLENOL PM) 25-500 MG TABS tablet Take 2 tablets by mouth at bedtime as needed (sleep).    . lovastatin (MEVACOR) 40 MG tablet TAKE 1 TABLET BY MOUTH DAILY AT BEDTIME 90 tablet 1  . pantoprazole (PROTONIX) 40 MG tablet Take 1 tablet (40 mg total) by mouth daily. 90 tablet 2  . ramipril (ALTACE) 5 MG capsule TAKE 1 CAPSULE BY MOUTH 2 TIMES DAILY. 180 capsule 3  . vitamin B-12 (CYANOCOBALAMIN) 1000 MCG tablet Take 1,000 mcg by mouth daily.     No facility-administered medications prior to visit.      Per HPI unless specifically indicated in ROS section below Review of Systems     Objective:    BP 130/66 (BP Location: Left Arm, Patient Position: Sitting, Cuff Size: Large)   Pulse 99   Temp 100 F (37.8 C) (Oral)   Wt 248 lb 12 oz (112.8 kg)   SpO2  95%   BMI 31.94 kg/m   Wt Readings from Last 3 Encounters:  08/06/17 248 lb 12 oz (112.8 kg)  07/14/17 253 lb 12.8 oz (115.1 kg)  03/03/17 250 lb 8 oz (113.6 kg)    Physical Exam  Constitutional: He appears well-developed and well-nourished. No distress.  Abdominal: Soft. Normal appearance and bowel sounds are normal. He exhibits no distension and no mass. There is no hepatosplenomegaly. There is tenderness (mild lower abd discomfort). There is no rigidity, no rebound, no guarding, no CVA tenderness and negative Murphy's sign.  Nursing note and vitals reviewed.  Results for orders placed or performed in visit on 08/06/17  POCT Urinalysis Dipstick (Automated)  Result Value Ref Range   Color, UA yellow    Clarity, UA clear    Glucose, UA negative    Bilirubin, UA negative    Ketones, UA negative    Spec Grav, UA 1.015 1.010 - 1.025   Blood, UA negative    pH, UA 6.0 5.0 - 8.0   Protein, UA negative    Urobilinogen, UA 0.2 0.2 or 1.0 E.U./dL   Nitrite, UA negative    Leukocytes, UA Small (1+) (A) Negative      Assessment & Plan:   Problem List Items Addressed This Visit    UTI (urinary tract infection) - Primary  Anticipate UTI (last 2013) - cover with 7d cipro course. Update if not improving with treatment. UCx sent.  Pt agrees with plan.       Relevant Orders   Urine Culture    Other Visit Diagnoses    Dysuria       Relevant Orders   POCT Urinalysis Dipstick (Automated) (Completed)       Follow up plan: Return if symptoms worsen or fail to improve.  Ria Bush, MD

## 2017-08-06 NOTE — Assessment & Plan Note (Addendum)
Anticipate UTI (last 2013) - cover with 7d cipro course. Update if not improving with treatment. UCx sent.  Pt agrees with plan.

## 2017-08-06 NOTE — Patient Instructions (Addendum)
I do think you have UTI - treat with cipro 7 day course. Push fluids and rest. Let us know if not improving with treatment.   Urinary Tract Infection, Adult A urinary tract infection (UTI) is an infection of any part of the urinary tract, which includes the kidneys, ureters, bladder, and urethra. These organs make, store, and get rid of urine in the body. UTI can be a bladder infection (cystitis) or kidney infection (pyelonephritis). What are the causes? This infection may be caused by fungi, viruses, or bacteria. Bacteria are the most common cause of UTIs. This condition can also be caused by repeated incomplete emptying of the bladder during urination. What increases the risk? This condition is more likely to develop if:  You ignore your need to urinate or hold urine for long periods of time.  You do not empty your bladder completely during urination.  You wipe back to front after urinating or having a bowel movement, if you are male.  You are uncircumcised, if you are male.  You are constipated.  You have a urinary catheter that stays in place (indwelling).  You have a weak defense (immune) system.  You have a medical condition that affects your bowels, kidneys, or bladder.  You have diabetes.  You take antibiotic medicines frequently or for long periods of time, and the antibiotics no longer work well against certain types of infections (antibiotic resistance).  You take medicines that irritate your urinary tract.  You are exposed to chemicals that irritate your urinary tract.  You are male.  What are the signs or symptoms? Symptoms of this condition include:  Fever.  Frequent urination or passing small amounts of urine frequently.  Needing to urinate urgently.  Pain or burning with urination.  Urine that smells bad or unusual.  Cloudy urine.  Pain in the lower abdomen or back.  Trouble urinating.  Blood in the urine.  Vomiting or being less hungry  than normal.  Diarrhea or abdominal pain.  Vaginal discharge, if you are male.  How is this diagnosed? This condition is diagnosed with a medical history and physical exam. You will also need to provide a urine sample to test your urine. Other tests may be done, including:  Blood tests.  Sexually transmitted disease (STD) testing.  If you have had more than one UTI, a cystoscopy or imaging studies may be done to determine the cause of the infections. How is this treated? Treatment for this condition often includes a combination of two or more of the following:  Antibiotic medicine.  Other medicines to treat less common causes of UTI.  Over-the-counter medicines to treat pain.  Drinking enough water to stay hydrated.  Follow these instructions at home:  Take over-the-counter and prescription medicines only as told by your health care provider.  If you were prescribed an antibiotic, take it as told by your health care provider. Do not stop taking the antibiotic even if you start to feel better.  Avoid alcohol, caffeine, tea, and carbonated beverages. They can irritate your bladder.  Drink enough fluid to keep your urine clear or pale yellow.  Keep all follow-up visits as told by your health care provider. This is important.  Make sure to: ? Empty your bladder often and completely. Do not hold urine for long periods of time. ? Empty your bladder before and after sex. ? Wipe from front to back after a bowel movement if you are male. Use each tissue one time when you wipe.  Contact a health care provider if:  You have back pain.  You have a fever.  You feel nauseous or vomit.  Your symptoms do not get better after 3 days.  Your symptoms go away and then return. Get help right away if:  You have severe back pain or lower abdominal pain.  You are vomiting and cannot keep down any medicines or water. This information is not intended to replace advice given to you  by your health care provider. Make sure you discuss any questions you have with your health care provider. Document Released: 07/08/2005 Document Revised: 03/11/2016 Document Reviewed: 08/19/2015 Elsevier Interactive Patient Education  2017 [employer].

## 2017-08-09 LAB — URINE CULTURE
MICRO NUMBER:: 81202759
SPECIMEN QUALITY:: ADEQUATE

## 2017-08-10 ENCOUNTER — Other Ambulatory Visit: Payer: Self-pay | Admitting: Family Medicine

## 2017-08-10 MED ORDER — CIPROFLOXACIN HCL 250 MG PO TABS: 250.0000 mg | ORAL_TABLET | Freq: Two times a day (BID) | ORAL | 0 refills | Status: DC

## 2017-08-12 ENCOUNTER — Other Ambulatory Visit: Payer: Self-pay | Admitting: Family Medicine

## 2017-08-12 NOTE — Telephone Encounter (Signed)
Spoke with [facility] confirming they have cipro rx. Says they do and will fill for pt. Also, says they will call [facility] [address] to let them know and contact pt.

## 2017-08-12 NOTE — Telephone Encounter (Signed)
Copied from [place] #[id number]. Topic: Inquiry >> Aug 12, 2017  2:45 PM Malena Catholic I, NT wrote: Reason for CRM: Pharmacy is  In the phone because the pt is in the pharmacy waitting

## 2017-08-12 NOTE — Telephone Encounter (Signed)
Copied from [place]. Topic: Quick Communication - See Telephone Encounter >> Aug 12, 2017  2:27 PM Arletha Grippe wrote: CRM for notification. See Telephone encounter for:  08/12/17. Pharmacy sent over cipro 500 mg request, pt is waiting at pharmacy.   [place] [phone] also skyped fc, for direction

## 2017-08-12 NOTE — Telephone Encounter (Signed)
Sent in. [facility]

## 2017-09-27 DIAGNOSIS — M7542 Impingement syndrome of left shoulder: Secondary | ICD-10-CM | POA: Diagnosis not present

## 2017-09-27 DIAGNOSIS — M25512 Pain in left shoulder: Secondary | ICD-10-CM | POA: Diagnosis not present

## 2017-09-27 DIAGNOSIS — G8929 Other chronic pain: Secondary | ICD-10-CM | POA: Diagnosis not present

## 2017-10-13 ENCOUNTER — Other Ambulatory Visit: Payer: Self-pay | Admitting: Family Medicine

## 2018-02-09 DIAGNOSIS — H2513 Age-related nuclear cataract, bilateral: Secondary | ICD-10-CM | POA: Diagnosis not present

## 2018-07-18 ENCOUNTER — Other Ambulatory Visit: Payer: Self-pay | Admitting: Cardiovascular Disease

## 2018-07-18 ENCOUNTER — Other Ambulatory Visit: Payer: Self-pay | Admitting: Family Medicine

## 2018-07-25 ENCOUNTER — Other Ambulatory Visit: Payer: Self-pay | Admitting: Family Medicine

## 2018-08-17 ENCOUNTER — Telehealth: Payer: Self-pay | Admitting: Cardiovascular Disease

## 2018-08-17 NOTE — Telephone Encounter (Signed)
° °  11/6 left msg vcml to schedule recall with APP-

## 2018-08-29 ENCOUNTER — Other Ambulatory Visit: Payer: Self-pay | Admitting: Cardiovascular Disease

## 2018-09-27 DIAGNOSIS — I10 Essential (primary) hypertension: Secondary | ICD-10-CM | POA: Insufficient documentation

## 2018-09-27 NOTE — Progress Notes (Signed)
Cardiology Office Note    Date:  09/28/2018   ID:  Luis, Sweeney 01-Dec-1940, MRN [id number]  PCP:  Ria Bush, MD  Cardiologist: Sherren Mocha, MD EPS: None  Chief Complaint  Patient presents with  . Follow-up    History of Present Illness:  Luis Sweeney is a 77 y.o. male with history of CAD status post DES to the LAD in 2004, patent stent and nonobstructive CAD elsewhere on repeat cath in 2017, hypertension, CKD stage III, NASH, hyperlipidemia.  Saw Dr. Burt Knack 07/14/2017 was doing well.  Patient comes in for yearly f/u. Not exercising regularly but says he'll start in the New year. Has been taking care of wife who had a stroke and had a knee replacement and had another stroke. Biggest complaint is vision and arthritis.  Denies chest pain, palpitations, dyspnea, dyspnea on exertion, dizziness or presyncope.  Past Medical History:  Diagnosis Date  . Acute duodenal ulcer with hemorrhage but without obstruction    required  bld. transfusion  . Anemia, iron deficiency   . Arthritis    OA- pt. reports its. minor  . CAD (coronary artery disease) 2004   a. s/p MI treated with drug-eluting stent placement to the proximal left anterior descending in late 2004  . CKD (chronic kidney disease), stage III ([place])   . Diverticulosis of colon   . Duodenal stricture - recurrent 09/21/2014  . 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 GERD (gastroesophageal reflux disease)   . H/O hiatal hernia   . Helicobacter pylori gastritis    recurrent  . Hypercholesteremia   . Hyperglycemia   . Hypertension   . Myocardial infarct ([place])   . NAFLD (nonalcoholic fatty liver disease) 2015   with liver cysts  . Paralysis of upper limb ([facility])    R arm- birth injury  . Personal history of colonic polyps   . Urticaria    Lat left arm    Past Surgical History:  Procedure Laterality Date  . ANTERIOR CERVICAL DECOMP/DISCECTOMY FUSION  03/2013   C3/4, C4/5 HNP  (kritzer)  . ANTERIOR CERVICAL DECOMP/DISCECTOMY FUSION N/A 03/15/2013   Procedure: ANTERIOR CERVICAL DECOMPRESSION/DISCECTOMY FUSION 2 LEVELS;  Surgeon: Faythe Ghee, MD;  Location: [facility];  Service: Neurosurgery;  Laterality: N/A;  Cervical three-four, cervical four-five anterior cervical diskectomy fusion with a trabecular metal plus plate   . CARDIAC CATHETERIZATION  2006   stent placed  . CARDIAC CATHETERIZATION N/A 07/08/2016   Procedure: Left Heart Cath and Coronary Angiography;  Surgeon: Sherren Mocha, MD;  Location: [facility];  Service: Cardiovascular;  Laterality: N/A;  . COLONOSCOPY  multiple   diverticulosis, int hem, h/o adenomatous polyps [name])  . COLONOSCOPY  07/2014   severe diverticulosis, no rpt needed [name])  . CORONARY STENT PLACEMENT  2006   drug-eluting  . ESOPHAGOGASTRODUODENOSCOPY  11/26/10   mild gastritis, duodenitis, duodenal stricture (Dr. [name])  . ESOPHAGOGASTRODUODENOSCOPY  10//2015   duodenal stricture, ?recurrent ulcer [name])  . HERNIA REPAIR Right 2006   Dr. Hassell Done  . KNEE SURGERY     left  . SHOULDER SURGERY     right  . spinal cyst removal    . WRIST SURGERY      Current Medications: Current Meds  Medication Sig  . aspirin EC 81 MG tablet Take 81 mg by mouth daily.  . Cholecalciferol (VITAMIN D3) 1000 units CAPS Take 1 capsule (1,000  Units total) by mouth daily.  . diphenhydramine-acetaminophen (TYLENOL PM) 25-500 MG TABS tablet Take 2 tablets by mouth at bedtime as needed (sleep).  . lovastatin (MEVACOR) 40 MG tablet TAKE 1 TABLET BY MOUTH DAILY AT BEDTIME  . pantoprazole (PROTONIX) 40 MG tablet TAKE 1 TABLET BY MOUTH ONCE DAILY  . ramipril (ALTACE) 5 MG capsule Take 1 capsule by mouth twice daily. Patient needs to call and schedule overdue follow up appointment for further refills 2nd attempt  . vitamin B-12 (CYANOCOBALAMIN) 1000 MCG tablet Take 1,000 mcg by mouth daily.     Allergies:   Prednisone; Codeine;  Lipitor [atorvastatin]; Morphine; and Simvastatin   Social History   Socioeconomic History  . Marital status: Married    Spouse name: Not on file  . Number of children: 3  . Years of education: Not on file  . Highest education level: Not on file  Occupational History  . Occupation: HT Warden/ranger: [employer]    Comment: metal Fabrication/Stairs/Rails commerial  Social Needs  . Financial resource strain: Not on file  . Food insecurity:    Worry: Not on file    Inability: Not on file  . Transportation needs:    Medical: Not on file    Non-medical: Not on file  Tobacco Use  . Smoking status: Former Smoker    Packs/day: 1.50    Years: 3.00    Pack years: 4.50    Last attempt to quit: 02/16/1985    Years since quitting: 33.6  . Smokeless tobacco: Never Used  Substance and Sexual Activity  . Alcohol use: Yes    Alcohol/week: 0.0 standard drinks    Comment: two to three times a year  . Drug use: No  . Sexual activity: Never    Comment: last few years.  Lifestyle  . Physical activity:    Days per week: Not on file    Minutes per session: Not on file  . Stress: Not on file  Relationships  . Social connections:    Talks on phone: Not on file    Gets together: Not on file    Attends religious service: Not on file    Active member of club or organization: Not on file    Attends meetings of clubs or organizations: Not on file    Relationship status: Not on file  Other Topics Concern  . Not on file  Social History Narrative   Daily caffeine use 3 per day   Lives with wife    Activity: no regular exercise   Diet: tries to avoid sweets, some water, fruits/vegetables occasionally     Family History:  The patient's family history includes Arthritis in his sister; Cancer in his sister; Diabetes in his father and sister; Heart disease in his father; Heart failure in his father; Hypertension in his sister and sister; Kidney disease in his father; Ovarian  cancer in an other family member.   ROS:   Please see the history of present illness.    Review of Systems  Constitution: Negative.  HENT: Negative.   Eyes: Positive for visual disturbance.  Cardiovascular: Negative.   Respiratory: Negative.   Endocrine: Negative.   Hematologic/Lymphatic: Negative.   Musculoskeletal: Negative.   Gastrointestinal: Negative.   Genitourinary: Negative.   Neurological: Negative.    All other systems reviewed and are negative.   PHYSICAL EXAM:   VS:  BP 130/60   Pulse 88   Ht 6\' 2"  (1.88  m)   Wt 256 lb 12.8 oz (116.5 kg)   BMI 32.97 kg/m   Physical Exam  GEN: Obese, in no acute distress  Neck: no JVD, carotid bruits, or masses Cardiac:RRR; 1/6 systolic murmur the left sternal border Respiratory:  clear to auscultation bilaterally, normal work of breathing GI: soft, nontender, nondistended, + BS Ext: without cyanosis, clubbing, or edema, Good distal pulses bilaterally Neuro:  Alert and Oriented x 3 Psych: euthymic mood, full affect  Wt Readings from Last 3 Encounters:  09/28/18 256 lb 12.8 oz (116.5 kg)  08/06/17 248 lb 12 oz (112.8 kg)  07/14/17 253 lb 12.8 oz (115.1 kg)      Studies/Labs Reviewed:   EKG:  EKG is  ordered today.  The ekg ordered today demonstrates normal sinus rhythm with occasional PVCs nonspecific ST-T wave changes, no acute change  Recent Labs: No results found for requested labs within last 8760 hours.   Lipid Panel    Component Value Date/Time   CHOL 121 03/01/2017 0919   TRIG 48.0 03/01/2017 0919   HDL 44.70 03/01/2017 0919   CHOLHDL 3 03/01/2017 0919   VLDL 9.6 03/01/2017 0919   LDLCALC 67 03/01/2017 0919    Additional studies/ records that were reviewed today include:   Cardiac Cath 07-08-2016: Conclusion    1. Widely patent proximal LAD stent with only mild in-stent restenosis 2. Moderate stenosis of a very small OM branch of the circumflex, otherwise widely patent circumflex 3. Angiographically  normal right coronary artery   Suspect a noncardiac chest pain. Continue medical therapy for CAD.    Technical Details INDICATION: Chest pain, known CAD With hx of Cypher DES in 2004 (LAD)  PROCEDURAL DETAILS:  The right groin was prepped, draped, and anesthetized with 1% lidocaine. Using modified Seldinger technique, a 5 French sheath was introduced into the right femoral artery. Standard Judkins catheters were used for coronary angiography. Catheter exchanges were performed over an 0.035 guidewire. There were no immediate procedural complications. The patient was transferred to the post catheterization recovery area for further monitoring.     Estimated blood loss <50 mL. . During this procedure the patient was administered the following to achieve and maintain moderate conscious sedation: Versed 2 mg, Fentanyl 25 mcg, while the patient's heart rate, blood pressure, and oxygen saturation were continuously monitored.    Coronary Findings    Dominance: Right  Left Anterior Descending  Prox LAD to Mid LAD lesion, 30% stenosed. The lesion was previously treated using a drug eluting stent over 2 years ago.  First Diagonal Branch  Vessel is small in size.  Ost 1st Diag to 1st Diag lesion, 90% stenosed. samll vessel, jailed from stent  Left Circumflex  First Obtuse Marginal Branch  Vessel is small in size.  Ost 1st Mrg to 1st Mrg lesion, 60% stenosed.  Right Coronary Artery  Vessel is angiographically normal.  Left Heart    Left Ventricle LV end diastolic pressure is normal.    Coronary Diagrams    Diagnostic Diagram       ASSESSMENT:    1. Atherosclerosis of native coronary artery of native heart without angina pectoris   2. Essential hypertension   3. CKD (chronic kidney disease), stage III ([place])   4. Hyperlipidemia, unspecified hyperlipidemia type      PLAN:  In order of problems listed above:  CAD DES to the LAD 2004, patent stent and nonobstructive CAD on cath 2017  doing well without angina.  Continue aspirin and lovastatin.  Recommend 30 minutes of exercise daily.  Follow-up with Dr. Burt Knack in 1 year  Essential HTN blood pressure well controlled on low-dose Altace  CKD stage 3 has not had labs since 02/2017 at which time creatinine was 1.79.  Will do full labs  Hyperlipidemia on lovastatin check fasting lipid panel and LFTs    Medication Adjustments/Labs and Tests Ordered: Current medicines are reviewed at length with the patient today.  Concerns regarding medicines are outlined above.  Medication changes, Labs and Tests ordered today are listed in the Patient Instructions below. Patient Instructions  Medication Instructions:  Your physician recommends that you continue on your current medications as directed. Please refer to the Current Medication list given to you today.  If you need a refill on your cardiac medications before your next appointment, please call your pharmacy.   Lab work: Your physician recommends that you return for a FASTING lipid profile, complete metabolic panel, and complete blood count on 09/30/18  If you have labs (blood work) drawn today and your tests are completely normal, you will receive your results only by: Marland Kitchen MyChart Message (if you have MyChart) OR . A paper copy in the mail If you have any lab test that is abnormal or we need to change your treatment, we will call you to review the results.  Testing/Procedures: None ordered  Follow-Up: At [facility], you and your health needs are our priority.  As part of our continuing mission to provide you with exceptional heart care, we have created designated Provider Care Teams.  These Care Teams include your primary Cardiologist (physician) and Advanced Practice Providers (APPs -  Physician Assistants and Nurse Practitioners) who all work together to provide you with the care you need, when you need it. You will need a follow up appointment in:  12 months.  Please call  our office 2 months in advance to schedule this appointment.  You may see Sherren Mocha, MD or one of the following Advanced Practice Providers on your designated Care Team: Richardson Dopp, PA-C [place], [place] . Daune Perch, NP  Any Other Special Instructions Will Be Listed Below (If Applicable).  You should get 30 minutes of exercise a day atleast 5 days a week.     Sumner Boast, PA-C  09/28/2018 10:31 AM    [facility] [address], [place], [place]  [address] Phone: [phone]; Fax: [phone]

## 2018-09-28 ENCOUNTER — Ambulatory Visit: Payer: PPO | Admitting: Physician Assistant

## 2018-09-28 ENCOUNTER — Encounter: Payer: Self-pay | Admitting: Physician Assistant

## 2018-09-28 VITALS — BP 130/60 | HR 88 | Ht 74.0 in | Wt 256.8 lb

## 2018-09-28 DIAGNOSIS — N183 Chronic kidney disease, stage 3 unspecified: Secondary | ICD-10-CM

## 2018-09-28 DIAGNOSIS — I10 Essential (primary) hypertension: Secondary | ICD-10-CM | POA: Diagnosis not present

## 2018-09-28 DIAGNOSIS — I2510 Atherosclerotic heart disease of native coronary artery without angina pectoris: Secondary | ICD-10-CM | POA: Diagnosis not present

## 2018-09-28 DIAGNOSIS — E785 Hyperlipidemia, unspecified: Secondary | ICD-10-CM

## 2018-09-28 NOTE — Patient Instructions (Signed)
Medication Instructions:  Your physician recommends that you continue on your current medications as directed. Please refer to the Current Medication list given to you today.  If you need a refill on your cardiac medications before your next appointment, please call your pharmacy.   Lab work: Your physician recommends that you return for a FASTING lipid profile, complete metabolic panel, and complete blood count on 09/30/18  If you have labs (blood work) drawn today and your tests are completely normal, you will receive your results only by: Marland Kitchen MyChart Message (if you have MyChart) OR . A paper copy in the mail If you have any lab test that is abnormal or we need to change your treatment, we will call you to review the results.  Testing/Procedures: None ordered  Follow-Up: At [facility], you and your health needs are our priority.  As part of our continuing mission to provide you with exceptional heart care, we have created designated Provider Care Teams.  These Care Teams include your primary Cardiologist (physician) and Advanced Practice Providers (APPs -  Physician Assistants and Nurse Practitioners) who all work together to provide you with the care you need, when you need it. You will need a follow up appointment in:  12 months.  Please call our office 2 months in advance to schedule this appointment.  You may see Sherren Mocha, MD or one of the following Advanced Practice Providers on your designated Care Team: Richardson Dopp, PA-C [place], [place] . Daune Perch, NP  Any Other Special Instructions Will Be Listed Below (If Applicable).  You should get 30 minutes of exercise a day atleast 5 days a week.

## 2018-09-29 ENCOUNTER — Other Ambulatory Visit: Payer: Self-pay | Admitting: Cardiovascular Disease

## 2018-09-29 ENCOUNTER — Other Ambulatory Visit: Payer: PPO

## 2018-09-29 DIAGNOSIS — E785 Hyperlipidemia, unspecified: Secondary | ICD-10-CM

## 2018-09-29 DIAGNOSIS — N183 Chronic kidney disease, stage 3 unspecified: Secondary | ICD-10-CM

## 2018-09-29 DIAGNOSIS — I2510 Atherosclerotic heart disease of native coronary artery without angina pectoris: Secondary | ICD-10-CM

## 2018-09-29 DIAGNOSIS — I10 Essential (primary) hypertension: Secondary | ICD-10-CM

## 2018-09-29 LAB — CBC
Hematocrit: 40.7 % (ref 37.5–51.0)
Hemoglobin: 13.9 g/dL (ref 13.0–17.7)
MCH: 31.2 pg (ref 26.6–33.0)
MCHC: 34.2 g/dL (ref 31.5–35.7)
MCV: 91 fL (ref 79–97)
Platelets: 243 10*3/uL (ref 150–450)
RBC: 4.46 x10E6/uL (ref 4.14–5.80)
RDW: 12.7 % (ref 12.3–15.4)
WBC: 6.9 10*3/uL (ref 3.4–10.8)

## 2018-09-29 LAB — COMPREHENSIVE METABOLIC PANEL
ALT: 14 IU/L (ref 0–44)
AST: 19 IU/L (ref 0–40)
Albumin/Globulin Ratio: 2 (ref 1.2–2.2)
Albumin: 4 g/dL (ref 3.5–4.8)
Alkaline Phosphatase: 32 IU/L — ABNORMAL LOW (ref 39–117)
BUN/Creatinine Ratio: 12 (ref 10–24)
BUN: 19 mg/dL (ref 8–27)
Bilirubin Total: 0.4 mg/dL (ref 0.0–1.2)
CO2: 25 mmol/L (ref 20–29)
Calcium: 9 mg/dL (ref 8.6–10.2)
Chloride: 102 mmol/L (ref 96–106)
Creatinine, Ser: 1.64 mg/dL — ABNORMAL HIGH (ref 0.76–1.27)
GFR calc Af Amer: 46 mL/min/{1.73_m2} — ABNORMAL LOW (ref >59)
GFR calc non Af Amer: 40 mL/min/{1.73_m2} — ABNORMAL LOW (ref >59)
Globulin, Total: 2 g/dL (ref 1.5–4.5)
Glucose: 103 mg/dL — ABNORMAL HIGH (ref 65–99)
Potassium: 4.8 mmol/L (ref 3.5–5.2)
Sodium: 140 mmol/L (ref 134–144)
Total Protein: 6 g/dL (ref 6.0–8.5)

## 2018-09-29 LAB — LIPID PANEL
Chol/HDL Ratio: 2.8 ratio (ref 0.0–5.0)
Cholesterol, Total: 111 mg/dL (ref 100–199)
HDL: 39 mg/dL — ABNORMAL LOW (ref >39)
LDL Calculated: 62 mg/dL (ref 0–99)
Triglycerides: 52 mg/dL (ref 0–149)
VLDL Cholesterol Cal: 10 mg/dL (ref 5–40)

## 2018-09-30 ENCOUNTER — Other Ambulatory Visit: Payer: PPO

## 2018-10-20 ENCOUNTER — Telehealth: Payer: Self-pay | Admitting: Family Medicine

## 2018-10-20 NOTE — Telephone Encounter (Signed)
E-scribed refill.  Please schedule annual wellness.

## 2018-10-21 NOTE — Telephone Encounter (Signed)
Noted  

## 2018-10-21 NOTE — Telephone Encounter (Signed)
3/2 medicare wellness with lisa and dr g

## 2018-11-03 DIAGNOSIS — L570 Actinic keratosis: Secondary | ICD-10-CM | POA: Diagnosis not present

## 2018-11-03 DIAGNOSIS — L814 Other melanin hyperpigmentation: Secondary | ICD-10-CM | POA: Diagnosis not present

## 2018-11-03 DIAGNOSIS — L820 Inflamed seborrheic keratosis: Secondary | ICD-10-CM | POA: Diagnosis not present

## 2018-12-11 ENCOUNTER — Other Ambulatory Visit: Payer: Self-pay | Admitting: Family Medicine

## 2018-12-11 DIAGNOSIS — E785 Hyperlipidemia, unspecified: Secondary | ICD-10-CM

## 2018-12-11 DIAGNOSIS — N183 Chronic kidney disease, stage 3 unspecified: Secondary | ICD-10-CM

## 2018-12-11 DIAGNOSIS — E538 Deficiency of other specified B group vitamins: Secondary | ICD-10-CM

## 2018-12-11 DIAGNOSIS — E119 Type 2 diabetes mellitus without complications: Secondary | ICD-10-CM

## 2018-12-12 ENCOUNTER — Ambulatory Visit (INDEPENDENT_AMBULATORY_CARE_PROVIDER_SITE_OTHER): Payer: PPO | Admitting: Family Medicine

## 2018-12-12 ENCOUNTER — Ambulatory Visit: Payer: PPO

## 2018-12-12 ENCOUNTER — Encounter: Payer: Self-pay | Admitting: Family Medicine

## 2018-12-12 VITALS — BP 134/70 | HR 67 | Temp 97.8°F | Ht 72.0 in | Wt 251.0 lb

## 2018-12-12 DIAGNOSIS — E538 Deficiency of other specified B group vitamins: Secondary | ICD-10-CM

## 2018-12-12 DIAGNOSIS — N183 Chronic kidney disease, stage 3 unspecified: Secondary | ICD-10-CM

## 2018-12-12 DIAGNOSIS — I10 Essential (primary) hypertension: Secondary | ICD-10-CM

## 2018-12-12 DIAGNOSIS — Z0000 Encounter for general adult medical examination without abnormal findings: Secondary | ICD-10-CM | POA: Diagnosis not present

## 2018-12-12 DIAGNOSIS — Z7189 Other specified counseling: Secondary | ICD-10-CM

## 2018-12-12 DIAGNOSIS — E785 Hyperlipidemia, unspecified: Secondary | ICD-10-CM

## 2018-12-12 DIAGNOSIS — E119 Type 2 diabetes mellitus without complications: Secondary | ICD-10-CM | POA: Diagnosis not present

## 2018-12-12 DIAGNOSIS — I2510 Atherosclerotic heart disease of native coronary artery without angina pectoris: Secondary | ICD-10-CM

## 2018-12-12 DIAGNOSIS — K219 Gastro-esophageal reflux disease without esophagitis: Secondary | ICD-10-CM

## 2018-12-12 DIAGNOSIS — E673 Hypervitaminosis D: Secondary | ICD-10-CM

## 2018-12-12 DIAGNOSIS — E669 Obesity, unspecified: Secondary | ICD-10-CM

## 2018-12-12 LAB — VITAMIN B12: Vitamin B-12: 647 pg/mL (ref 211–911)

## 2018-12-12 LAB — HEMOGLOBIN A1C: Hgb A1c MFr Bld: 6.6 % — ABNORMAL HIGH (ref 4.6–6.5)

## 2018-12-12 LAB — VITAMIN D 25 HYDROXY (VIT D DEFICIENCY, FRACTURES): VITD: 65.06 ng/mL (ref 30.00–100.00)

## 2018-12-12 MED ORDER — PANTOPRAZOLE SODIUM 40 MG PO TBEC: 40.0000 mg | DELAYED_RELEASE_TABLET | Freq: Every day | ORAL | 3 refills | Status: DC

## 2018-12-12 MED ORDER — LOVASTATIN 40 MG PO TABS: 40.0000 mg | ORAL_TABLET | Freq: Every day | ORAL | 3 refills | Status: DC

## 2018-12-12 NOTE — Assessment & Plan Note (Signed)

## 2018-12-12 NOTE — Assessment & Plan Note (Addendum)
Chronic, stable. Discussed with patient.

## 2018-12-12 NOTE — Progress Notes (Signed)
BP 134/70 (BP Location: Left Arm, Patient Position: Sitting, Cuff Size: Large)   Pulse 67   Temp 97.8 F (36.6 C) (Oral)   Ht 6' (1.829 m)   Wt 251 lb (113.9 kg)   SpO2 98%   BMI 34.04 kg/m    CC: AMW/CPE Subjective:    Patient ID: Luis Sweeney, male    DOB: 30-Jun-1941, 78 y.o.   MRN: [id number]  HPI: Luis Sweeney is a 78 y.o. male presenting on 12/12/2018 for Medicare Wellness   Did not see Katha Cabal today for medicare wellness visit as she is out today.   2019 was a tough year - wife recovering from knee replacement surgery complicated by stroke, then had PNA more recently. 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 Had to do house repairs last year.    Hearing Screening   125Hz  250Hz  500Hz  1000Hz  2000Hz  3000Hz  4000Hz  6000Hz  8000Hz   Right ear:   20 20 20   40    Left ear:   20 20 20   0    Vision Screening Comments: Last eye exam, 07/2018.  Passes fall and depression screens  Preventative: ESOPHAGOGASTRODUODENOSCOPY Date: 10//2015 duodenal stricture, ?recurrent ulcer [name])  COLONOSCOPY Date: 07/2014 severe diverticulosis, no rpt needed [name]) Prostate cancer screening - last checked 05/2013. No problems in past. Nocturia x1 per night. Strong stream. Agrees to space out.  Lung cancer screening - not eligible  Flu - declines  Pneumovax 2004, 2009. prevnar 2016  Td - 05/2012  Shingrix - declines  Advanced directives: doesn't have set up. Wouldn't want prolonged life support.Would want oldest daughter [name] RN) to be [employer]. Will work on this.  Seat belt use discussed.  Sunscreen use discussed. No changing moles on skin. Sees derm.  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 Interim medical history since our last visit reviewed. Allergies and medications reviewed and updated. Outpatient Medications Prior to Visit  Medication Sig Dispense Refill  . aspirin EC 81 MG tablet Take 81 mg by mouth daily.    . Cholecalciferol (VITAMIN D3) 1000 units CAPS Take 1 capsule (1,000 Units total) by mouth daily. 30 capsule   . ramipril (ALTACE) 5 MG capsule TAKE 1 CAPSULE BY MOUTH TWICE DAILY 60 capsule 11  . vitamin B-12 (CYANOCOBALAMIN) 1000 MCG tablet Take 1,000 mcg by mouth daily.    Marland Kitchen lovastatin (MEVACOR) 40 MG tablet TAKE 1 TABLET BY MOUTH EVERY NIGHT AT BEDTIME 90 tablet 0  . pantoprazole (PROTONIX) 40 MG tablet TAKE 1 TABLET BY MOUTH ONCE DAILY 90 tablet 2  . diphenhydramine-acetaminophen (TYLENOL PM) 25-500 MG TABS tablet Take 2 tablets by mouth at bedtime as needed (sleep).     No facility-administered medications prior to visit.      Per HPI unless specifically indicated in ROS section below Review of Systems  Constitutional: Negative for activity change, appetite change, chills, fatigue, fever and unexpected weight change.  HENT: Positive for congestion (URI recently). Negative for hearing loss.   Eyes: Negative for visual disturbance.  Respiratory: Positive for cough (recent illness now better). Negative for chest tightness, shortness of breath and wheezing.   Cardiovascular: Negative for  chest pain, palpitations and leg swelling.  Gastrointestinal: Positive for constipation (intermittent). Negative for abdominal distention, abdominal pain, blood in stool, diarrhea, nausea and vomiting.  Genitourinary: Negative for difficulty urinating and hematuria.  Musculoskeletal: Negative for arthralgias, myalgias and neck pain.  Skin: Negative for rash.  Neurological: Negative for dizziness, seizures, syncope and headaches.  Hematological: Negative for adenopathy. Does not bruise/bleed easily.  Psychiatric/Behavioral: Negative for  dysphoric mood. The patient is not nervous/anxious.    Objective:    BP 134/70 (BP Location: Left Arm, Patient Position: Sitting, Cuff Size: Large)   Pulse 67   Temp 97.8 F (36.6 C) (Oral)   Ht 6' (1.829 m)   Wt 251 lb (113.9 kg)   SpO2 98%   BMI 34.04 kg/m   Wt Readings from Last 3 Encounters:  12/12/18 251 lb (113.9 kg)  09/28/18 256 lb 12.8 oz (116.5 kg)  08/06/17 248 lb 12 oz (112.8 kg)    Physical Exam Vitals signs and nursing note reviewed.  Constitutional:      General: He is not in acute distress.    Appearance: He is well-developed.  HENT:     Head: Normocephalic and atraumatic.     Right Ear: Hearing, tympanic membrane, ear canal and external ear normal.     Left Ear: Hearing, tympanic membrane, ear canal and external ear normal.     Nose: Nose normal.     Mouth/Throat:     Mouth: Mucous membranes are moist.     Pharynx: Oropharynx is clear. Uvula midline. No oropharyngeal exudate or posterior oropharyngeal erythema.  Eyes:     General: No scleral icterus.    Conjunctiva/sclera: Conjunctivae normal.     Pupils: Pupils are equal, round, and reactive to light.  Neck:     Musculoskeletal: Normal range of motion and neck supple.     Vascular: No carotid bruit.  Cardiovascular:     Rate and Rhythm: Normal rate and regular rhythm.     Pulses: Normal pulses.          Radial pulses are 2+ on the right side and 2+ on the left side.     Heart sounds: Normal heart sounds. No murmur.  Pulmonary:     Effort: Pulmonary effort is normal. No respiratory distress.     Breath sounds: Normal breath sounds. No wheezing, rhonchi or rales.  Abdominal:     General: Bowel sounds are normal. There is no distension.     Palpations: Abdomen is soft. There is no mass.     Tenderness: There is no abdominal tenderness. There is no guarding or rebound.  Musculoskeletal: Normal range of motion.     Right lower leg: No edema.     Left lower leg: No edema.  Lymphadenopathy:      Cervical: No cervical adenopathy.  Skin:    General: Skin is warm and dry.     Findings: No rash.  Neurological:     Mental Status: He is alert and oriented to person, place, and time.     Comments: CN grossly intact, station and gait intact Recall 3/3 Calculation 5/5 [id number]  Psychiatric:        Mood and Affect: Mood normal.        Behavior: Behavior normal.        Thought Content: Thought content normal.        Judgment: Judgment normal.       Results for orders placed or performed in visit on 09/29/18  Comprehensive metabolic  panel  Result Value Ref Range   Glucose 103 (H) 65 - 99 mg/dL   BUN 19 8 - 27 mg/dL   Creatinine, Ser 1.64 (H) 0.76 - 1.27 mg/dL   GFR calc non Af Amer 40 (L) >59 mL/min/1.73   GFR calc Af Amer 46 (L) >59 mL/min/1.73   BUN/Creatinine Ratio 12 10 - 24   Sodium 140 134 - 144 mmol/L   Potassium 4.8 3.5 - 5.2 mmol/L   Chloride 102 96 - 106 mmol/L   CO2 25 20 - 29 mmol/L   Calcium 9.0 8.6 - 10.2 mg/dL   Total Protein 6.0 6.0 - 8.5 g/dL   Albumin 4.0 3.5 - 4.8 g/dL   Globulin, Total 2.0 1.5 - 4.5 g/dL   Albumin/Globulin Ratio 2.0 1.2 - 2.2   Bilirubin Total 0.4 0.0 - 1.2 mg/dL   Alkaline Phosphatase 32 (L) 39 - 117 IU/L   AST 19 0 - 40 IU/L   ALT 14 0 - 44 IU/L  CBC  Result Value Ref Range   WBC 6.9 3.4 - 10.8 x10E3/uL   RBC 4.46 4.14 - 5.80 x10E6/uL   Hemoglobin 13.9 13.0 - 17.7 g/dL   Hematocrit 40.7 37.5 - 51.0 %   MCV 91 79 - 97 fL   MCH 31.2 26.6 - 33.0 pg   MCHC 34.2 31.5 - 35.7 g/dL   RDW 12.7 12.3 - 15.4 %   Platelets 243 150 - 450 x10E3/uL  Lipid panel  Result Value Ref Range   Cholesterol, Total 111 100 - 199 mg/dL   Triglycerides 52 0 - 149 mg/dL   HDL 39 (L) >39 mg/dL   VLDL Cholesterol Cal 10 5 - 40 mg/dL   LDL Calculated 62 0 - 99 mg/dL   Chol/HDL Ratio 2.8 0.0 - 5.0 ratio   Lab Results  Component Value Date   HGBA1C 6.9 (H) 03/01/2017    Assessment & Plan:   Problem List Items Addressed This Visit    Vitamin B12  deficiency    Update b12 level. Continue replacement.       Obesity, Class I, BMI 30-34.9    Encouraged healthy diet and lifestyle changes to affect sustainable weight loss.       Medicare annual wellness visit, subsequent - Primary    I have personally reviewed the Medicare Annual Wellness questionnaire and have noted 1. The patient's medical and social history 2. Their use of alcohol, tobacco or illicit drugs 3. Their current medications and supplements 4. The patient's functional ability including ADL's, fall risks, home safety risks and hearing or visual impairment. Cognitive function has been assessed and addressed as indicated.  5. Diet and physical activity 6. Evidence for depression or mood disorders The patients weight, height, BMI have been recorded in the chart. I have made referrals, counseling and provided education to the patient based on review of the above and I have provided the pt with a written personalized care plan for preventive services. Provider list updated.. See scanned questionairre as needed for further documentation. Reviewed preventative protocols and updated unless pt declined.       Hyperlipidemia    Chronic, stable on lovastatin. The ASCVD Risk score Mikey Bussing DC Jr., et al., 2013) failed to calculate for the following reasons:   The patient has a prior MI or stroke diagnosis       Relevant Medications   lovastatin (MEVACOR) 40 MG tablet   Health maintenance examination    Preventative protocols reviewed and updated unless  pt declined. Discussed healthy diet and lifestyle.       GERD    Continue pantoprazole daily.       Relevant Medications   pantoprazole (PROTONIX) 40 MG tablet   Essential hypertension    Chronic, stable. Continue current regimen.       Relevant Medications   lovastatin (MEVACOR) 40 MG tablet   ERB'S PALSY   Diet-controlled diabetes mellitus ([place])    Update a1c today. If elevated, consider 6 mo f/u visit.        Relevant Medications   lovastatin (MEVACOR) 40 MG tablet   Coronary atherosclerosis of native coronary artery (Chronic)    Appreciate cards care.       Relevant Medications   lovastatin (MEVACOR) 40 MG tablet   CKD (chronic kidney disease), stage III (HCC) (Chronic)    Chronic, stable. Discussed with patient.      Advanced care planning/counseling discussion    Advanced directives: doesn't have set up. Wouldn't want prolonged life support.Would want oldest daughter [name] RN) to be [employer]. Will work on this.       Other Visit Diagnoses    High vitamin D level       Relevant Orders   VITAMIN D 25 Hydroxy (Vit-D Deficiency, Fractures)       Meds ordered this encounter  Medications  . lovastatin (MEVACOR) 40 MG tablet    Sig: Take 1 tablet (40 mg total) by mouth at bedtime.    Dispense:  90 tablet    Refill:  3    Needs annual wellness appointment for additional refills  . pantoprazole (PROTONIX) 40 MG tablet    Sig: Take 1 tablet (40 mg total) by mouth daily.    Dispense:  90 tablet    Refill:  3   Orders Placed This Encounter  Procedures  . VITAMIN D 25 Hydroxy (Vit-D Deficiency, Fractures)    Follow up plan: Return in about 1 year (around 12/12/2019) for annual exam, prior fasting for blood work, medicare wellness visit.  Ria Bush, MD

## 2018-12-12 NOTE — Assessment & Plan Note (Signed)
Update b12 level. Continue replacement.

## 2018-12-12 NOTE — Assessment & Plan Note (Signed)
Continue pantoprazole daily.  

## 2018-12-12 NOTE — Assessment & Plan Note (Signed)
Update a1c today. If elevated, consider 6 mo f/u visit.

## 2018-12-12 NOTE — Assessment & Plan Note (Signed)
Preventative protocols reviewed and updated unless pt declined. Discussed healthy diet and lifestyle.  

## 2018-12-12 NOTE — Assessment & Plan Note (Signed)
Chronic, stable on lovastatin. The ASCVD Risk score Mikey Bussing DC Jr., et al., 2013) failed to calculate for the following reasons:   The patient has a prior MI or stroke diagnosis

## 2018-12-12 NOTE — Patient Instructions (Addendum)
If interested, check with local pharmacy about new 2 shot shingles series (shingrix).  Work on advanced directive, bring me copy when you're completed.  Good to see you today. You are doing well today  Return as needed or in 1 year for next wellness visit.   Health Maintenance After Age 78 After age 21, you are at a higher risk for certain long-term diseases and infections as well as injuries from falls. Falls are a major cause of broken bones and head injuries in people who are older than age 78. Getting regular preventive care can help to keep you healthy and well. Preventive care includes getting regular testing and making lifestyle changes as recommended by your health care provider. Talk with your health care provider about:  Which screenings and tests you should have. A screening is a test that checks for a disease when you have no symptoms.  A diet and exercise plan that is right for you. What should I know about screenings and tests to prevent falls? Screening and testing are the best ways to find a health problem early. Early diagnosis and treatment give you the best chance of managing medical conditions that are common after age 2. Certain conditions and lifestyle choices may make you more likely to have a fall. Your health care provider may recommend:  Regular vision checks. Poor vision and conditions such as cataracts can make you more likely to have a fall. If you wear glasses, make sure to get your prescription updated if your vision changes.  Medicine review. Work with your health care provider to regularly review all of the medicines you are taking, including over-the-counter medicines. Ask your health care provider about any side effects that may make you more likely to have a fall. Tell your health care provider if any medicines that you take make you feel dizzy or sleepy.  Osteoporosis screening. Osteoporosis is a condition that causes the bones to get weaker. This can make the  bones weak and cause them to break more easily.  Blood pressure screening. Blood pressure changes and medicines to control blood pressure can make you feel dizzy.  Strength and balance checks. Your health care provider may recommend certain tests to check your strength and balance while standing, walking, or changing positions.  Foot health exam. Foot pain and numbness, as well as not wearing proper footwear, can make you more likely to have a fall.  Depression screening. You may be more likely to have a fall if you have a fear of falling, feel emotionally low, or feel unable to do activities that you used to do.  Alcohol use screening. Using too much alcohol can affect your balance and may make you more likely to have a fall. What actions can I take to lower my risk of falls? General instructions  Talk with your health care provider about your risks for falling. Tell your health care provider if: ? You fall. Be sure to tell your health care provider about all falls, even ones that seem minor. ? You feel dizzy, sleepy, or off-balance.  Take over-the-counter and prescription medicines only as told by your health care provider. These include any supplements.  Eat a healthy diet and maintain a healthy weight. A healthy diet includes low-fat dairy products, low-fat (lean) meats, and fiber from whole grains, beans, and lots of fruits and vegetables. Home safety  Remove any tripping hazards, such as rugs, cords, and clutter.  Install safety equipment such as grab bars in bathrooms  and safety rails on stairs.  Keep rooms and walkways well-lit. Activity   Follow a regular exercise program to stay fit. This will help you maintain your balance. Ask your health care provider what types of exercise are appropriate for you.  If you need a cane or walker, use it as recommended by your health care provider.  Wear supportive shoes that have nonskid soles. Lifestyle  Do not drink alcohol if your  health care provider tells you not to drink.  If you drink alcohol, limit how much you have: ? 0-1 drink a day for women. ? 0-2 drinks a day for men.  Be aware of how much alcohol is in your drink. In the [place] one drink equals one typical bottle of beer (12 oz), one-half glass of wine (5 oz), or one shot of hard liquor (1 oz).  Do not use any products that contain nicotine or tobacco, such as cigarettes and e-cigarettes. If you need help quitting, ask your health care provider. Summary  Having a healthy lifestyle and getting preventive care can help to protect your health and wellness after age 65.  Screening and testing are the best way to find a health problem early and help you avoid having a fall. Early diagnosis and treatment give you the best chance for managing medical conditions that are more common for people who are older than age 78.  Falls are a major cause of broken bones and head injuries in people who are older than age 22. Take precautions to prevent a fall at home.  Work with your health care provider to learn what changes you can make to improve your health and wellness and to prevent falls. This information is not intended to replace advice given to you by your health care provider. Make sure you discuss any questions you have with your health care provider. Document Released: 08/11/2017 Document Revised: 08/11/2017 Document Reviewed: 08/11/2017 Elsevier Interactive Patient Education  2019 [employer].

## 2018-12-12 NOTE — Assessment & Plan Note (Signed)
Chronic, stable. Continue current regimen. 

## 2018-12-12 NOTE — Assessment & Plan Note (Signed)
Encouraged healthy diet and lifestyle changes to affect sustainable weight loss.  

## 2018-12-12 NOTE — Assessment & Plan Note (Signed)
Advanced directives: doesn't have set up. Wouldn't want prolonged life support.Would want oldest daughter [name] RN) to be [employer]. Will work on this.

## 2018-12-12 NOTE — Assessment & Plan Note (Signed)
Appreciate cards care. 

## 2019-04-10 DIAGNOSIS — H2513 Age-related nuclear cataract, bilateral: Secondary | ICD-10-CM | POA: Diagnosis not present

## 2019-05-17 ENCOUNTER — Encounter: Payer: Self-pay | Admitting: Family Medicine

## 2019-05-17 ENCOUNTER — Other Ambulatory Visit: Payer: PPO

## 2019-05-17 ENCOUNTER — Ambulatory Visit (INDEPENDENT_AMBULATORY_CARE_PROVIDER_SITE_OTHER): Payer: PPO | Admitting: Family Medicine

## 2019-05-17 VITALS — Ht 72.0 in

## 2019-05-17 DIAGNOSIS — R197 Diarrhea, unspecified: Secondary | ICD-10-CM | POA: Diagnosis not present

## 2019-05-17 DIAGNOSIS — N183 Chronic kidney disease, stage 3 unspecified: Secondary | ICD-10-CM

## 2019-05-17 DIAGNOSIS — M5442 Lumbago with sciatica, left side: Secondary | ICD-10-CM | POA: Diagnosis not present

## 2019-05-17 DIAGNOSIS — E119 Type 2 diabetes mellitus without complications: Secondary | ICD-10-CM

## 2019-05-17 DIAGNOSIS — R1030 Lower abdominal pain, unspecified: Secondary | ICD-10-CM

## 2019-05-17 NOTE — Assessment & Plan Note (Signed)
Anticipate viral gastroenteritis - seems to be improving. Supportive care reviewed. Discussed importance of hydration status. Red flags to seek further care reviewed. Check CBC, CMP next week. Advised let us know if develops fever, cough, dyspnea, recurrent diarrhea or other Covid symptoms.

## 2019-05-17 NOTE — Assessment & Plan Note (Signed)
Ongoing for weeks. Advised if persistent to schedule in-office visit for further evaluation.

## 2019-05-17 NOTE — Progress Notes (Signed)
Luis Sweeney - 78 y.o. male  MRN [id number]  Date of Birth: [birth year]  PCP: Ria Bush, MD  This service was provided via telemedicine. Phone Visit performed on 05/17/2019    Rationale for phone visit along with limitations reviewed. Patient consented to telephone encounter.    Location of patient: in his car  Location of provider: in office, [place] [facility] Name of referring provider: N/A   Names of persons and role in encounter: Provider: Ria Bush, MD  Patient: Luis Sweeney  Other: N/A   Time on call: 10:26am - 10:46am   Subjective: Chief Complaint  Patient presents with  . Back Pain    C/o left side low back pain, diarrhea and abd pain.  Back pain started about 1 wk ago. Diarrhea lasted about 2 days.  Wants to have gallbladder checked.      HPI:  5d h/o nausea and diarrhea for 2 days, loss of appetite and sweats. Diarrhea 3x/day, watery stools. These symptoms have largely resolved with improved appetite. Also had dull abdominal discomfort anteriorly (not LLQ, RLQ) - today that is better. Noticing persistent L sided lower back pain with radiation down buttock to thigh worse with bending forward (ongoing for last 3-4 wks). Last BM was 2 days ago - watery.   No fevers/chills, cough, dyspnea, vomiting, blood or mucous in stool, urinary symptom (dysuria, hematuria). No loss of taste or smell, no body aches.   No sick contacts at home. Wife has been well.  He did attend church mtg Sunday - with mask and social distancing.    Objective/Observations:  No physical exam or vital signs collected unless specifically identified below.   Ht 6' (1.829 m)   BMI 34.04 kg/m    Respiratory status: speaks in complete sentences without evident shortness of breath.   Results for orders placed or performed in visit on 12/12/18  VITAMIN D 25 Hydroxy (Vit-D Deficiency, Fractures)  Result Value Ref Range   VITD 65.06 30.00 - 100.00 ng/mL  Vitamin B12  Result  Value Ref Range   Vitamin B-12 647 211 - 911 pg/mL  Hemoglobin A1c  Result Value Ref Range   Hgb A1c MFr Bld 6.6 (H) 4.6 - 6.5 %   Lab Results  Component Value Date   CREATININE 1.64 (H) 09/29/2018   BUN 19 09/29/2018   NA 140 09/29/2018   K 4.8 09/29/2018   CL 102 09/29/2018   CO2 25 09/29/2018   Lab Results  Component Value Date   WBC 6.9 09/29/2018   HGB 13.9 09/29/2018   HCT 40.7 09/29/2018   MCV 91 09/29/2018   PLT 243 09/29/2018    Assessment/Plan:  CKD (chronic kidney disease), stage III Check Cr when he comes for labs next week.   Diarrhea Anticipate viral gastroenteritis - seems to be improving. Supportive care reviewed. Discussed importance of hydration status. Red flags to seek further care reviewed. Check CBC, CMP next week. Advised let us know if develops fever, cough, dyspnea, recurrent diarrhea or other Covid symptoms.   Diet-controlled diabetes mellitus ([place]) Update A1c when comes for labs next week.  Acute left-sided low back pain with left-sided sciatica Ongoing for weeks. Advised if persistent to schedule in-office visit for further evaluation.    I discussed the assessment and treatment plan with the patient. The patient was provided an opportunity to ask questions and all were answered. The patient agreed with the plan and demonstrated an understanding of the instructions.   Lab  Orders     Comprehensive metabolic panel     CBC with Differential/Platelet     Hemoglobin A1c  No orders of the defined types were placed in this encounter.   The patient was advised to call back or seek an in-person evaluation if the symptoms worsen or if the condition fails to improve as anticipated.  Ria Bush, MD

## 2019-05-17 NOTE — Assessment & Plan Note (Signed)
Check Cr when he comes for labs next week.

## 2019-05-17 NOTE — Assessment & Plan Note (Signed)
Update A1c when comes for labs next week.

## 2019-05-22 ENCOUNTER — Other Ambulatory Visit (INDEPENDENT_AMBULATORY_CARE_PROVIDER_SITE_OTHER): Payer: PPO

## 2019-05-22 ENCOUNTER — Other Ambulatory Visit: Payer: Self-pay

## 2019-05-22 DIAGNOSIS — R1030 Lower abdominal pain, unspecified: Secondary | ICD-10-CM | POA: Diagnosis not present

## 2019-05-22 DIAGNOSIS — N183 Chronic kidney disease, stage 3 unspecified: Secondary | ICD-10-CM

## 2019-05-22 DIAGNOSIS — E119 Type 2 diabetes mellitus without complications: Secondary | ICD-10-CM

## 2019-05-22 LAB — CBC WITH DIFFERENTIAL/PLATELET
Basophils Absolute: 0 10*3/uL (ref 0.0–0.1)
Basophils Relative: 0.6 % (ref 0.0–3.0)
Eosinophils Absolute: 0.1 10*3/uL (ref 0.0–0.7)
Eosinophils Relative: 1.2 % (ref 0.0–5.0)
HCT: 41.2 % (ref 39.0–52.0)
Hemoglobin: 13.6 g/dL (ref 13.0–17.0)
Lymphocytes Relative: 22.5 % (ref 12.0–46.0)
Lymphs Abs: 1.3 10*3/uL (ref 0.7–4.0)
MCHC: 33 g/dL (ref 30.0–36.0)
MCV: 93.9 fl (ref 78.0–100.0)
Monocytes Absolute: 0.5 10*3/uL (ref 0.1–1.0)
Monocytes Relative: 8 % (ref 3.0–12.0)
Neutro Abs: 3.9 10*3/uL (ref 1.4–7.7)
Neutrophils Relative %: 67.7 % (ref 43.0–77.0)
Platelets: 225 10*3/uL (ref 150.0–400.0)
RBC: 4.39 Mil/uL (ref 4.22–5.81)
RDW: 14.1 % (ref 11.5–15.5)
WBC: 5.8 10*3/uL (ref 4.0–10.5)

## 2019-05-22 LAB — COMPREHENSIVE METABOLIC PANEL
ALT: 12 U/L (ref 0–53)
AST: 17 U/L (ref 0–37)
Albumin: 3.8 g/dL (ref 3.5–5.2)
Alkaline Phosphatase: 28 U/L — ABNORMAL LOW (ref 39–117)
BUN: 24 mg/dL — ABNORMAL HIGH (ref 6–23)
CO2: 28 mEq/L (ref 19–32)
Calcium: 8.9 mg/dL (ref 8.4–10.5)
Chloride: 103 mEq/L (ref 96–112)
Creatinine, Ser: 1.72 mg/dL — ABNORMAL HIGH (ref 0.40–1.50)
GFR: 38.59 mL/min — ABNORMAL LOW (ref >60.00)
Glucose, Bld: 114 mg/dL — ABNORMAL HIGH (ref 70–99)
Potassium: 4.3 mEq/L (ref 3.5–5.1)
Sodium: 138 mEq/L (ref 135–145)
Total Bilirubin: 0.5 mg/dL (ref 0.2–1.2)
Total Protein: 5.7 g/dL — ABNORMAL LOW (ref 6.0–8.3)

## 2019-05-22 LAB — HEMOGLOBIN A1C: Hgb A1c MFr Bld: 6.7 % — ABNORMAL HIGH (ref 4.6–6.5)

## 2019-05-22 NOTE — Addendum Note (Signed)
Addended by: Cloyd Stagers on: 05/22/2019 03:45 PM   Modules accepted: Orders

## 2019-05-23 LAB — POC URINALSYSI DIPSTICK (AUTOMATED)
Bilirubin, UA: NEGATIVE
Blood, UA: NEGATIVE
Glucose, UA: NEGATIVE
Ketones, UA: NEGATIVE
Leukocytes, UA: NEGATIVE
Nitrite, UA: NEGATIVE
Protein, UA: NEGATIVE
Spec Grav, UA: >=1.03 — AB (ref 1.010–1.025)
Urobilinogen, UA: 0.2 E.U./dL
pH, UA: 5 (ref 5.0–8.0)

## 2019-05-23 NOTE — Addendum Note (Signed)
Addended by: Cloyd Stagers on: 05/23/2019 08:01 AM   Modules accepted: Orders

## 2019-05-23 NOTE — Addendum Note (Signed)
Addended by: Brenton Grills on: 01/02/5572 22:02 PM   Modules accepted: Orders

## 2019-05-31 DIAGNOSIS — M5416 Radiculopathy, lumbar region: Secondary | ICD-10-CM | POA: Insufficient documentation

## 2019-06-08 DIAGNOSIS — M5416 Radiculopathy, lumbar region: Secondary | ICD-10-CM | POA: Diagnosis not present

## 2019-06-08 DIAGNOSIS — M545 Low back pain: Secondary | ICD-10-CM | POA: Diagnosis not present

## 2019-06-15 DIAGNOSIS — M5416 Radiculopathy, lumbar region: Secondary | ICD-10-CM | POA: Diagnosis not present

## 2019-07-04 DIAGNOSIS — Z6832 Body mass index (BMI) 32.0-32.9, adult: Secondary | ICD-10-CM | POA: Diagnosis not present

## 2019-07-04 DIAGNOSIS — M5416 Radiculopathy, lumbar region: Secondary | ICD-10-CM | POA: Diagnosis not present

## 2019-07-04 DIAGNOSIS — M47816 Spondylosis without myelopathy or radiculopathy, lumbar region: Secondary | ICD-10-CM | POA: Diagnosis not present

## 2019-07-04 DIAGNOSIS — R030 Elevated blood-pressure reading, without diagnosis of hypertension: Secondary | ICD-10-CM | POA: Diagnosis not present

## 2019-07-06 DIAGNOSIS — H25013 Cortical age-related cataract, bilateral: Secondary | ICD-10-CM | POA: Diagnosis not present

## 2019-08-09 ENCOUNTER — Other Ambulatory Visit: Payer: Self-pay

## 2019-08-09 DIAGNOSIS — Z20822 Contact with and (suspected) exposure to covid-19: Secondary | ICD-10-CM

## 2019-08-10 ENCOUNTER — Encounter: Payer: Self-pay | Admitting: Family Medicine

## 2019-08-10 ENCOUNTER — Ambulatory Visit (INDEPENDENT_AMBULATORY_CARE_PROVIDER_SITE_OTHER): Payer: PPO | Admitting: Family Medicine

## 2019-08-10 VITALS — Wt 243.0 lb

## 2019-08-10 DIAGNOSIS — J069 Acute upper respiratory infection, unspecified: Secondary | ICD-10-CM

## 2019-08-10 LAB — NOVEL CORONAVIRUS, NAA: SARS-CoV-2, NAA: NOT DETECTED

## 2019-08-10 MED ORDER — BENZONATATE 100 MG PO CAPS: 100.0000 mg | ORAL_CAPSULE | Freq: Three times a day (TID) | ORAL | 0 refills | Status: DC | PRN

## 2019-08-10 NOTE — Progress Notes (Addendum)
    I connected with Luis Sweeney on 08/10/19 at 12:20 PM EDT by telephone and verified that I am speaking with the correct person using two identifiers.   I discussed the limitations, risks, security and privacy concerns of performing an evaluation and management service by video and the availability of in person appointments. I also discussed with the patient that there may be a patient responsible charge related to this service. The patient expressed understanding and agreed to proceed.  Patient location: Home Provider Location: [place] Participants: Lesleigh Noe and JAHDIEL CORSON   Subjective:     Luis Sweeney is a 78 y.o. male presenting for Cough (sx started around 08/07/2019, post nasal drip, cough- productive, sore throat.)     Cough This is a new problem. The current episode started in the past 7 days. The cough is non-productive. Associated symptoms include chest pain, postnasal drip, rhinorrhea and a sore throat. Pertinent negatives include no chills, ear pain, fever, headaches, myalgias, nasal congestion or shortness of breath. The symptoms are aggravated by lying down. He has tried OTC cough suppressant (aleve) for the symptoms. The treatment provided mild relief. There is no history of asthma, COPD or environmental allergies.   Breathing worse with laying down  Has improved with allegra  Review of Systems  Constitutional: Negative for chills and fever.  HENT: Positive for postnasal drip, rhinorrhea and sore throat. Negative for ear pain.   Respiratory: Positive for cough. Negative for shortness of breath.   Cardiovascular: Positive for chest pain.  Musculoskeletal: Negative for myalgias.  Allergic/Immunologic: Negative for environmental allergies.  Neurological: Negative for headaches.     Social History   Tobacco Use  Smoking Status Former Smoker  . Packs/day: 1.50  . Years: 3.00  . Pack years: 4.50  . Quit date: 02/16/1985  . Years since  quitting: 34.5  Smokeless Tobacco Never Used        Objective:   BP Readings from Last 3 Encounters:  12/12/18 134/70  09/28/18 130/60  08/06/17 130/66   Wt Readings from Last 3 Encounters:  08/10/19 243 lb (110.2 kg)  12/12/18 251 lb (113.9 kg)  09/28/18 256 lb 12.8 oz (116.5 kg)    Wt 243 lb (110.2 kg) Comment: per patient  BMI 32.96 kg/m   Physical Exam On the phone Speaking in complete sentences  No distress Intermittent coughing   Covid testing: negative on 08/09/2019     Assessment & Plan:   Problem List Items Addressed This Visit    None    Visit Diagnoses    Viral URI with cough    -  Primary   Relevant Medications   benzonatate (TESSALON) 100 MG capsule     Likely viral w/o fever or chills Covid negative Symptomatic care Continue allergy medication Return if worsening/not improved in 10 days ER precautions  Interactive audio and video telecommunications were attempted between this provider and patient, however failed, due to patient having technical difficulties OR patient did not have access to video capability.  We continued and completed visit with audio only.   8 minutes were spent speaking with patient.   Return if symptoms worsen or fail to improve.  Lesleigh Noe, MD

## 2019-09-19 NOTE — Progress Notes (Signed)
Cardiology Office Note:    Date:  09/20/2019   ID:  Luis Sweeney, DOB 03-16-1941, MRN [id number]  PCP:  Ria Bush, MD  Cardiologist:  Sherren Mocha, MD  Electrophysiologist:  None   Referring MD: Ria Bush, MD   Chief Complaint  Patient presents with  . Follow-up    CAD    History of Present Illness:    Luis Sweeney is a 78 y.o. male with:   Coronary artery disease   S/p DES to LAD in 2004  Hypertension   Chronic kidney disease   NASH  Hyperlipidemia   PUD  Mr. Palmateer was last seen in clinic by Ermalinda Barrios, PA-C in 09/2018.    He returns for follow-up.  He is here today with his wife.  Since last seen, he has an occasional twinge or cramp in his chest.  This is not like his previous angina.  He has not had significant shortness of breath, orthopnea.  He does have some leg swelling that worsens with standing and improves with elevation.  He has not had syncope.  Prior CV studies:   The following studies were reviewed today:  Cardiac catheterization 07/08/16 LAD prox stent patent with 30 ISR; D1 ost 90 (small jailed) OM1 60 RCA normal   Myoview 03/27/2009 EF 86, no ischemia  Past Medical History:  Diagnosis Date  . Acute duodenal ulcer with hemorrhage but without obstruction    required  bld. transfusion  . Anemia, iron deficiency   . Arthritis    OA- pt. reports its. minor  . CAD (coronary artery disease) 2004   a. s/p MI treated with drug-eluting stent placement to the proximal left anterior descending in late 2004  . CKD (chronic kidney disease), stage III   . Diverticulosis of colon   . Duodenal stricture - recurrent 09/21/2014  . 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 GERD (gastroesophageal reflux disease)   . H/O hiatal hernia   . Helicobacter pylori gastritis    recurrent  . Hypercholesteremia   . Hyperglycemia   . Hypertension   . Myocardial infarct ([place])   . NAFLD (nonalcoholic fatty liver  disease) 2015   with liver cysts  . Paralysis of upper limb (HCC)    R arm- birth injury  . Personal history of colonic polyps   . Urticaria    Lat left arm   Surgical Hx: The patient  has a past surgical history that includes Shoulder surgery; Wrist surgery; spinal cyst removal; Knee surgery; Coronary stent placement (2006); Hernia repair (Right, 2006); Esophagogastroduodenoscopy (11/26/10); Colonoscopy (multiple); Cardiac catheterization (2006); Anterior cervical decomp/discectomy fusion (03/2013); Anterior cervical decomp/discectomy fusion (N/A, 03/15/2013); Esophagogastroduodenoscopy (10//2015); Colonoscopy (07/2014); and Cardiac catheterization (N/A, 07/08/2016).   Current Medications: Current Meds  Medication Sig  . aspirin EC 81 MG tablet Take 81 mg by mouth daily.  . Cholecalciferol (VITAMIN D3) 1000 units CAPS Take 1 capsule (1,000 Units total) by mouth daily.  Marland Kitchen lovastatin (MEVACOR) 40 MG tablet Take 1 tablet (40 mg total) by mouth at bedtime.  . pantoprazole (PROTONIX) 40 MG tablet Take 40 mg by mouth as needed.  . ramipril (ALTACE) 5 MG capsule TAKE 1 CAPSULE BY MOUTH TWICE DAILY  . vitamin B-12 (CYANOCOBALAMIN) 1000 MCG tablet Take 1,000 mcg by mouth daily.     Allergies:   Prednisone, Codeine, Lipitor [atorvastatin], Morphine, and Simvastatin   Social History   Tobacco Use  . Smoking  status: Former Smoker    Packs/day: 1.50    Years: 3.00    Pack years: 4.50    Quit date: 02/16/1985    Years since quitting: 34.6  . Smokeless tobacco: Never Used  Substance Use Topics  . Alcohol use: Yes    Alcohol/week: 0.0 standard drinks    Comment: two to three times a year  . Drug use: No     Family Hx: The patient's family history includes Arthritis in his sister; Cancer in his sister; Diabetes in his father and sister; Heart disease in his father; Heart failure in his father; Hypertension in his sister and sister; Kidney disease in his father; Ovarian cancer in an other family  member.  ROS:   Please see the history of present illness.    ROS All other systems reviewed and are negative.   EKGs/Labs/Other Test Reviewed:    EKG:  EKG is  ordered today.  The ekg ordered today demonstrates normal sinus rhythm, heart rate 78, normal axis, nonspecific ST-T wave changes, no significant change since prior tracing  Recent Labs: 05/22/2019: ALT 12; BUN 24; Creatinine, Ser 1.72; Hemoglobin 13.6; Platelets 225.0; Potassium 4.3; Sodium 138   Recent Lipid Panel Lab Results  Component Value Date/Time   CHOL 111 09/29/2018 10:21 AM   TRIG 52 09/29/2018 10:21 AM   HDL 39 (L) 09/29/2018 10:21 AM   CHOLHDL 2.8 09/29/2018 10:21 AM   CHOLHDL 3 03/01/2017 09:19 AM   LDLCALC 62 09/29/2018 10:21 AM    Physical Exam:    VS:  BP 124/64   Pulse 78   Ht 6\' 1"  (1.854 m)   Wt 250 lb 3.2 oz (113.5 kg)   SpO2 98%   BMI 33.01 kg/m     Wt Readings from Last 3 Encounters:  09/20/19 250 lb 3.2 oz (113.5 kg)  08/10/19 243 lb (110.2 kg)  12/12/18 251 lb (113.9 kg)     Physical Exam  Constitutional: He is oriented to person, place, and time. He appears well-developed and well-nourished. No distress.  HENT:  Head: Normocephalic and atraumatic.  Eyes: No scleral icterus.  Neck: No thyromegaly present.  Cardiovascular: Normal rate, regular rhythm and normal heart sounds.  No murmur heard. Pulmonary/Chest: Effort normal and breath sounds normal. He has no rales.  Abdominal: Soft. There is no hepatomegaly.  Musculoskeletal:        General: Edema (tr bilateral ankle) present.  Lymphadenopathy:    He has no cervical adenopathy.  Neurological: He is alert and oriented to person, place, and time.  Skin: Skin is warm and dry.  Psychiatric: He has a normal mood and affect.    ASSESSMENT & PLAN:    1. Coronary artery disease involving native coronary artery of native heart without angina pectoris History of remote PCI with drug-eluting stent to the LAD in 2004.  Cardiac  catheterization demonstrated a patent stent to the LAD with a jailed first diagonal and moderate disease in the first obtuse marginal.  He is currently doing well without anginal symptoms.  Continue aspirin, lovastatin.  Follow-up in 1 year.  2. Essential hypertension The patient's blood pressure is controlled on his current regimen.  Continue current therapy.   3. Hyperlipidemia, unspecified hyperlipidemia type LDL optimal on most recent lab work.  Continue current Rx.  His lipids will be checked again during his wellness exam in March 2021.  4. Stage 3b chronic kidney disease Recent creatinine stable.   Dispo:  Return in about 1 year (around  09/19/2020) for Routine Follow Up, w/ Dr. Burt Knack, or Richardson Dopp, PA-C.   Medication Adjustments/Labs and Tests Ordered: Current medicines are reviewed at length with the patient today.  Concerns regarding medicines are outlined above.  Tests Ordered: Orders Placed This Encounter  Procedures  . HEART 12-Lead   Medication Changes: No orders of the defined types were placed in this encounter.   Signed, Richardson Dopp, PA-C  09/20/2019 5:34 PM    [facility] [address], [place], [place]  [address] Phone: [phone]; Fax: [phone]

## 2019-09-20 ENCOUNTER — Encounter: Payer: Self-pay | Admitting: Physician Assistant

## 2019-09-20 ENCOUNTER — Ambulatory Visit: Payer: PPO | Admitting: Physician Assistant

## 2019-09-20 ENCOUNTER — Other Ambulatory Visit: Payer: Self-pay

## 2019-09-20 VITALS — BP 124/64 | HR 78 | Ht 73.0 in | Wt 250.2 lb

## 2019-09-20 DIAGNOSIS — N1832 Chronic kidney disease, stage 3b: Secondary | ICD-10-CM | POA: Diagnosis not present

## 2019-09-20 DIAGNOSIS — E785 Hyperlipidemia, unspecified: Secondary | ICD-10-CM | POA: Diagnosis not present

## 2019-09-20 DIAGNOSIS — I2510 Atherosclerotic heart disease of native coronary artery without angina pectoris: Secondary | ICD-10-CM | POA: Diagnosis not present

## 2019-09-20 DIAGNOSIS — I10 Essential (primary) hypertension: Secondary | ICD-10-CM | POA: Diagnosis not present

## 2019-09-20 NOTE — Patient Instructions (Signed)
Medication Instructions:   Your physician recommends that you continue on your current medications as directed. Please refer to the Current Medication list given to you today.   *If you need a refill on your cardiac medications before your next appointment, please call your pharmacy*  Lab Work: [place]   If you have labs (blood work) drawn today and your tests are completely normal, you will receive your results only by: Marland Kitchen MyChart Message (if you have MyChart) OR . A paper copy in the mail If you have any lab test that is abnormal or we need to change your treatment, we will call you to review the results.  Testing/Procedures: NONE ORDERED  TODAY    Follow-Up: At [facility], you and your health needs are our priority.  As part of our continuing mission to provide you with exceptional heart care, we have created designated Provider Care Teams.  These Care Teams include your primary Cardiologist (physician) and Advanced Practice Providers (APPs -  Physician Assistants and Nurse Practitioners) who all work together to provide you with the care you need, when you need it.  Your next appointment:   1 year(s)  The format for your next appointment:   In Person  Provider:   You may see Sherren Mocha, MD or one of the following Advanced Practice Providers on your designated Care Team:    Richardson Dopp, PA-C  [place], [place]  Daune Perch, NP   Other Instructions

## 2019-10-25 ENCOUNTER — Other Ambulatory Visit: Payer: Self-pay | Admitting: Cardiovascular Disease

## 2019-12-06 DIAGNOSIS — Z23 Encounter for immunization: Secondary | ICD-10-CM | POA: Diagnosis not present

## 2020-01-19 ENCOUNTER — Other Ambulatory Visit: Payer: Self-pay

## 2020-01-19 ENCOUNTER — Ambulatory Visit (INDEPENDENT_AMBULATORY_CARE_PROVIDER_SITE_OTHER): Payer: PPO

## 2020-01-19 VITALS — Wt 252.0 lb

## 2020-01-19 DIAGNOSIS — Z0000 Encounter for general adult medical examination without abnormal findings: Secondary | ICD-10-CM

## 2020-01-19 NOTE — Progress Notes (Signed)
PCP notes:  Health Maintenance: Eye Exam- scheduled for May 2021 per patient   Abnormal Screenings: none   Patient concerns: Numbness in toes and feet Right hip pain    Nurse concerns: none   Next PCP appt.: none

## 2020-01-19 NOTE — Progress Notes (Signed)
Subjective:   Luis Sweeney is a 79 y.o. male who presents for Medicare Annual/Subsequent preventive examination.  Review of Systems: N/A   This visit is being conducted through telemedicine via telephone at the nurse health advisor's home address due to the COVID-19 pandemic. This patient has given me verbal consent via doximity to conduct this visit, patient states they are participating from their home address. Patient and myself are on the telephone call. There is no referral for this visit. Some vital signs may be absent or patient reported.    Patient identification: identified by name, DOB, and current address   Cardiac Risk Factors include: advanced age (>11men, >14 women);diabetes mellitus;hypertension;dyslipidemia;male gender     Objective:    Vitals: Wt 252 lb (114.3 kg)   BMI 33.25 kg/m   Body mass index is 33.25 kg/m.  Advanced Directives 01/19/2020 07/07/2016 02/20/2016 09/11/2015 03/15/2013 03/14/2013  Does Patient Have a Medical Advance Directive? No No No No Patient does not have advance directive Patient does not have advance directive;Patient would not like information  Would patient like information on creating a medical advance directive? No - Patient declined Yes - Scientist, clinical (histocompatibility and immunogenetics) given Yes - Scientist, clinical (histocompatibility and immunogenetics) given No - patient declined information - -  Pre-existing out of facility DNR order (yellow form or pink MOST form) - - - - No -    Tobacco Social History   Tobacco Use  Smoking Status Former Smoker  . Packs/day: 1.50  . Years: 3.00  . Pack years: 4.50  . Quit date: 02/16/1985  . Years since quitting: 34.9  Smokeless Tobacco Never Used     Counseling given: Not Answered   Clinical Intake:  Pre-visit preparation completed: Yes  Pain : 0-10 Pain Score: 2  Pain Type: Acute pain Pain Location: Hip Pain Orientation: Right Pain Descriptors / Indicators: Aching Pain Onset: More than a month ago Pain Frequency: Intermittent      Nutritional Risks: None Diabetes: Yes CBG done?: No Did pt. bring in CBG monitor from home?: No  How often do you need to have someone help you when you read instructions, pamphlets, or other written materials from your doctor or pharmacy?: 1 - Never What is the last grade level you completed in school?: some college  Interpreter Needed?: No  Information entered by :: CJohnson, LPN  Past Medical History:  Diagnosis Date  . Acute duodenal ulcer with hemorrhage but without obstruction    required  bld. transfusion  . Anemia, iron deficiency   . Arthritis    OA- pt. reports its. minor  . CAD (coronary artery disease) 2004   a. s/p MI treated with drug-eluting stent placement to the proximal left anterior descending in late 2004  . CKD (chronic kidney disease), stage III   . Diverticulosis of colon   . Duodenal stricture - recurrent 09/21/2014  . 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 GERD (gastroesophageal reflux disease)   . H/O hiatal hernia   . Helicobacter pylori gastritis    recurrent  . Hypercholesteremia   . Hyperglycemia   . Hypertension   . Myocardial infarct ([place])   . NAFLD (nonalcoholic fatty liver disease) 2015   with liver cysts  . Paralysis of upper limb (HCC)    R arm- birth injury  . Personal history of colonic polyps   . Urticaria    Lat left arm   Past Surgical History:  Procedure Laterality Date  .  ANTERIOR CERVICAL DECOMP/DISCECTOMY FUSION  03/2013   C3/4, C4/5 HNP (kritzer)  . ANTERIOR CERVICAL DECOMP/DISCECTOMY FUSION N/A 03/15/2013   Procedure: ANTERIOR CERVICAL DECOMPRESSION/DISCECTOMY FUSION 2 LEVELS;  Surgeon: Faythe Ghee, MD;  Location: [facility];  Service: Neurosurgery;  Laterality: N/A;  Cervical three-four, cervical four-five anterior cervical diskectomy fusion with a trabecular metal plus plate   . CARDIAC CATHETERIZATION  2006   stent placed  . CARDIAC CATHETERIZATION N/A 07/08/2016   Procedure: Left  Heart Cath and Coronary Angiography;  Surgeon: Sherren Mocha, MD;  Location: [facility];  Service: Cardiovascular;  Laterality: N/A;  . COLONOSCOPY  multiple   diverticulosis, int hem, h/o adenomatous polyps [name])  . COLONOSCOPY  07/2014   severe diverticulosis, no rpt needed [name])  . CORONARY STENT PLACEMENT  2006   drug-eluting  . ESOPHAGOGASTRODUODENOSCOPY  11/26/10   mild gastritis, duodenitis, duodenal stricture (Dr. [name])  . ESOPHAGOGASTRODUODENOSCOPY  10//2015   duodenal stricture, ?recurrent ulcer [name])  . HERNIA REPAIR Right 2006   Dr. Hassell Done  . KNEE SURGERY     left  . SHOULDER SURGERY     right  . spinal cyst removal    . WRIST SURGERY     Family History  Problem Relation Age of Onset  . Diabetes Father   . Heart disease Father        CHF  . Kidney disease Father        kidney failure  . Heart failure Father   . Diabetes Sister   . Hypertension Sister   . Cancer Sister        pelvic mass  . Hypertension Sister   . Arthritis Sister   . Ovarian cancer Other    Social History   Socioeconomic History  . Marital status: Married    Spouse name: Not on file  . Number of children: 3  . Years of education: Not on file  . Highest education level: Not on file  Occupational History  . Occupation: HT Warden/ranger: [employer]    Comment: metal Fabrication/Stairs/Rails commerial  Tobacco Use  . Smoking status: Former Smoker    Packs/day: 1.50    Years: 3.00    Pack years: 4.50    Quit date: 02/16/1985    Years since quitting: 34.9  . Smokeless tobacco: Never Used  Substance and Sexual Activity  . Alcohol use: Yes    Alcohol/week: 0.0 standard drinks    Comment: two to three times a year  . Drug use: No  . Sexual activity: Never    Comment: last few years.  Other Topics Concern  . Not on file  Social History Narrative   Daily caffeine use 3 per day   Lives with wife    Activity: no regular exercise   Diet:  tries to avoid sweets, some water, fruits/vegetables occasionally   Social Determinants of Health   Financial Resource Strain: Low Risk   . Difficulty of Paying Living Expenses: Not hard at all  Food Insecurity: No Food Insecurity  . Worried About Charity fundraiser in the Last Year: Never true  . Ran Out of Food in the Last Year: Never true  Transportation Needs: No Transportation Needs  . Lack of Transportation (Medical): No  . Lack of Transportation (Non-Medical): No  Physical Activity: Inactive  . Days of Exercise per Week: 0 days  . Minutes of Exercise per Session: 0 min  Stress: No Stress Concern  Present  . Feeling of Stress : Not at all  Social Connections:   . Frequency of Communication with Friends and Family:   . Frequency of Social Gatherings with Friends and Family:   . Attends Religious Services:   . Active Member of Clubs or Organizations:   . Attends Archivist Meetings:   Marland Kitchen Marital Status:     Outpatient Encounter Medications as of 01/19/2020  Medication Sig  . aspirin EC 81 MG tablet Take 81 mg by mouth daily.  . Cholecalciferol (VITAMIN D3) 1000 units CAPS Take 1 capsule (1,000 Units total) by mouth daily.  Marland Kitchen lovastatin (MEVACOR) 40 MG tablet Take 1 tablet (40 mg total) by mouth at bedtime.  . pantoprazole (PROTONIX) 40 MG tablet Take 40 mg by mouth as needed.  . ramipril (ALTACE) 5 MG capsule TAKE 1 CAPSULE BY MOUTH TWICE DAILY  . vitamin B-12 (CYANOCOBALAMIN) 1000 MCG tablet Take 1,000 mcg by mouth daily.   No facility-administered encounter medications on file as of 01/19/2020.    Activities of Daily Living In your present state of health, do you have any difficulty performing the following activities: 01/19/2020  Hearing? N  Vision? N  Difficulty concentrating or making decisions? N  Walking or climbing stairs? N  Dressing or bathing? N  Doing errands, shopping? N  Preparing Food and eating ? N  Using the Toilet? N  In the past six months,  have you accidently leaked urine? N  Do you have problems with loss of bowel control? N  Managing your Medications? N  Managing your Finances? N  Housekeeping or managing your Housekeeping? N  Some recent data might be hidden    Patient Care Team: Ria Bush, MD as PCP - General (Family Medicine) Sherren Mocha, MD as PCP - Cardiology (Cardiology) Dingeldein, Remo Lipps, MD as Consulting Physician (Ophthalmology) Sherren Mocha, MD as Consulting Physician (Cardiology) Karie Chimera, MD as Consulting Physician (Neurosurgery)   Assessment:   This is a routine wellness examination for [place].  Exercise Activities and Dietary recommendations Current Exercise Habits: The patient does not participate in regular exercise at present, Exercise limited by: None identified  Goals    . Increase physical activity     Starting 02/20/2016, I will attempt to do at least 5-15 minutes of chair exercises and walk on my treadmill as time permits.     . Patient Stated     01/19/2020, I will maintain and continue medications as prescribed.        Fall Risk Fall Risk  01/19/2020 12/12/2018 03/03/2017 03/03/2017 02/20/2016  Falls in the past year? 0 0 No No No  Number falls in past yr: 0 - - - -  Injury with Fall? 0 - - - -  Risk for fall due to : Medication side effect - - - -  Follow up Falls evaluation completed;Falls prevention discussed - - - -   Is the patient's home free of loose throw rugs in walkways, pet beds, electrical cords, etc?   yes      Grab bars in the bathroom? yes      Handrails on the stairs?   yes      Adequate lighting?   yes  Timed Get Up and Go Performed: N/A  Depression Screen PHQ 2/9 Scores 01/19/2020 12/12/2018 03/03/2017 03/03/2017  PHQ - 2 Score 0 0 0 0  PHQ- 9 Score 0 - - -    Cognitive Function MMSE - Mini Mental State Exam 01/19/2020 02/20/2016  Orientation to time 5 5  Orientation to Place 5 5  Registration 3 3  Attention/ Calculation 5 0  Recall 3 3  Language-  name 2 objects - 0  Language- repeat 1 1  Language- follow 3 step command - 3  Language- read & follow direction - 0  Write a sentence - 0  Copy design - 0  Total score - 20  Mini Cog  Mini-Cog screen was completed. Maximum score is 22. A value of 0 denotes this part of the MMSE was not completed or the patient failed this part of the Mini-Cog screening.       Immunization History  Administered Date(s) Administered  . Influenza Whole 07/10/2008  . Moderna SARS-COVID-2 Vaccination 12/11/2019, 01/08/2020  . Pneumococcal Conjugate-13 02/20/2015  . Pneumococcal Polysaccharide-23 10/12/2002, 07/10/2008  . Td 11/13/2000, 05/31/2012    Qualifies for Shingles Vaccine: Yes  Screening Tests Health Maintenance  Topic Date Due  . DTAP VACCINES (1) 01/02/1941  . OPHTHALMOLOGY EXAM  03/27/2010  . FOOT EXAM  03/03/2018  . HEMOGLOBIN A1C  11/22/2019  . INFLUENZA VACCINE  05/12/2020  . DTaP/Tdap/Td (3 - Tdap) 05/31/2022  . TETANUS/TDAP  05/31/2022  . PNA vac Low Risk Adult  Completed   Cancer Screenings: Lung: Low Dose CT Chest recommended if Age 51-80 years, 30 pack-year currently smoking OR have quit w/in 15 years. Patient does not qualify. Colorectal: completed 07/26/2014  Additional Screenings:  Hepatitis C Screening: N/A      Plan:   Patient will maintain and continue medications as prescribed.   I have personally reviewed and noted the following in the patient's chart:   . Medical and social history . Use of alcohol, tobacco or illicit drugs  . Current medications and supplements . Functional ability and status . Nutritional status . Physical activity . Advanced directives . List of other physicians . Hospitalizations, surgeries, and ER visits in previous 12 months . Vitals . Screenings to include cognitive, depression, and falls . Referrals and appointments  In addition, I have reviewed and discussed with patient certain preventive protocols, quality metrics, and  best practice recommendations. A written personalized care plan for preventive services as well as general preventive health recommendations were provided to patient.     Andrez Grime, LPN  [id number]

## 2020-01-19 NOTE — Patient Instructions (Signed)
Luis Sweeney , Thank you for taking time to come for your Medicare Wellness Visit. I appreciate your ongoing commitment to your health goals. Please review the following plan we discussed and let me know if I can assist you in the future.   Screening recommendations/referrals: Colonoscopy: Up to date, completed 07/26/2014 Recommended yearly ophthalmology/optometry visit for glaucoma screening and checkup Recommended yearly dental visit for hygiene and checkup  Vaccinations: Influenza vaccine: Fall 2021 Pneumococcal vaccine: Completed series Tdap vaccine: Up to date, completed 05/31/2012 Shingles vaccine: discussed    Advanced directives: Advance directive discussed with you today. Even though you declined this today please call our office should you change your mind and we can give you the proper paperwork for you to fill out.  Conditions/risks identified: Diabetes, hypertension, hyperlipidemia  Next appointment: none  Preventive Care 24 Years and Older, Male Preventive care refers to lifestyle choices and visits with your health care provider that can promote health and wellness. What does preventive care include?  A yearly physical exam. This is also called an annual well check.  Dental exams once or twice a year.  Routine eye exams. Ask your health care provider how often you should have your eyes checked.  Personal lifestyle choices, including:  Daily care of your teeth and gums.  Regular physical activity.  Eating a healthy diet.  Avoiding tobacco and drug use.  Limiting alcohol use.  Practicing safe sex.  Taking low doses of aspirin every day.  Taking vitamin and mineral supplements as recommended by your health care provider. What happens during an annual well check? The services and screenings done by your health care provider during your annual well check will depend on your age, overall health, lifestyle risk factors, and family history of disease. Counseling    Your health care provider may ask you questions about your:  Alcohol use.  Tobacco use.  Drug use.  Emotional well-being.  Home and relationship well-being.  Sexual activity.  Eating habits.  History of falls.  Memory and ability to understand (cognition).  Work and work Statistician. Screening  You may have the following tests or measurements:  Height, weight, and BMI.  Blood pressure.  Lipid and cholesterol levels. These may be checked every 5 years, or more frequently if you are over 38 years old.  Skin check.  Lung cancer screening. You may have this screening every year starting at age 30 if you have a 30-pack-year history of smoking and currently smoke or have quit within the past 15 years.  Fecal occult blood test (FOBT) of the stool. You may have this test every year starting at age 73.  Flexible sigmoidoscopy or colonoscopy. You may have a sigmoidoscopy every 5 years or a colonoscopy every 10 years starting at age 69.  Prostate cancer screening. Recommendations will vary depending on your family history and other risks.  Hepatitis C blood test.  Hepatitis B blood test.  Sexually transmitted disease (STD) testing.  Diabetes screening. This is done by checking your blood sugar (glucose) after you have not eaten for a while (fasting). You may have this done every 1-3 years.  Abdominal aortic aneurysm (AAA) screening. You may need this if you are a current or former smoker.  Osteoporosis. You may be screened starting at age 26 if you are at high risk. Talk with your health care provider about your test results, treatment options, and if necessary, the need for more tests. Vaccines  Your health care provider may recommend certain vaccines,  such as:  Influenza vaccine. This is recommended every year.  Tetanus, diphtheria, and acellular pertussis (Tdap, Td) vaccine. You may need a Td booster every 10 years.  Zoster vaccine. You may need this after age  36.  Pneumococcal 13-valent conjugate (PCV13) vaccine. One dose is recommended after age 64.  Pneumococcal polysaccharide (PPSV23) vaccine. One dose is recommended after age 89. Talk to your health care provider about which screenings and vaccines you need and how often you need them. This information is not intended to replace advice given to you by your health care provider. Make sure you discuss any questions you have with your health care provider. Document Released: 10/25/2015 Document Revised: 06/17/2016 Document Reviewed: 07/30/2015 Elsevier Interactive Patient Education  2017 [place] Prevention in the Home Falls can cause injuries. They can happen to people of all ages. There are many things you can do to make your home safe and to help prevent falls. What can I do on the outside of my home?  Regularly fix the edges of walkways and driveways and fix any cracks.  Remove anything that might make you trip as you walk through a door, such as a raised step or threshold.  Trim any bushes or trees on the path to your home.  Use bright outdoor lighting.  Clear any walking paths of anything that might make someone trip, such as rocks or tools.  Regularly check to see if handrails are loose or broken. Make sure that both sides of any steps have handrails.  Any raised decks and porches should have guardrails on the edges.  Have any leaves, snow, or ice cleared regularly.  Use sand or salt on walking paths during winter.  Clean up any spills in your garage right away. This includes oil or grease spills. What can I do in the bathroom?  Use night lights.  Install grab bars by the toilet and in the tub and shower. Do not use towel bars as grab bars.  Use non-skid mats or decals in the tub or shower.  If you need to sit down in the shower, use a plastic, non-slip stool.  Keep the floor dry. Clean up any water that spills on the floor as soon as it happens.  Remove  soap buildup in the tub or shower regularly.  Attach bath mats securely with double-sided non-slip rug tape.  Do not have throw rugs and other things on the floor that can make you trip. What can I do in the bedroom?  Use night lights.  Make sure that you have a light by your bed that is easy to reach.  Do not use any sheets or blankets that are too big for your bed. They should not hang down onto the floor.  Have a firm chair that has side arms. You can use this for support while you get dressed.  Do not have throw rugs and other things on the floor that can make you trip. What can I do in the kitchen?  Clean up any spills right away.  Avoid walking on wet floors.  Keep items that you use a lot in easy-to-reach places.  If you need to reach something above you, use a strong step stool that has a grab bar.  Keep electrical cords out of the way.  Do not use floor polish or wax that makes floors slippery. If you must use wax, use non-skid floor wax.  Do not have throw rugs and other things on  the floor that can make you trip. What can I do with my stairs?  Do not leave any items on the stairs.  Make sure that there are handrails on both sides of the stairs and use them. Fix handrails that are broken or loose. Make sure that handrails are as long as the stairways.  Check any carpeting to make sure that it is firmly attached to the stairs. Fix any carpet that is loose or worn.  Avoid having throw rugs at the top or bottom of the stairs. If you do have throw rugs, attach them to the floor with carpet tape.  Make sure that you have a light switch at the top of the stairs and the bottom of the stairs. If you do not have them, ask someone to add them for you. What else can I do to help prevent falls?  Wear shoes that:  Do not have high heels.  Have rubber bottoms.  Are comfortable and fit you well.  Are closed at the toe. Do not wear sandals.  If you use a  stepladder:  Make sure that it is fully opened. Do not climb a closed stepladder.  Make sure that both sides of the stepladder are locked into place.  Ask someone to hold it for you, if possible.  Clearly mark and make sure that you can see:  Any grab bars or handrails.  First and last steps.  Where the edge of each step is.  Use tools that help you move around (mobility aids) if they are needed. These include:  Canes.  Walkers.  Scooters.  Crutches.  Turn on the lights when you go into a dark area. Replace any light bulbs as soon as they burn out.  Set up your furniture so you have a clear path. Avoid moving your furniture around.  If any of your floors are uneven, fix them.  If there are any pets around you, be aware of where they are.  Review your medicines with your doctor. Some medicines can make you feel dizzy. This can increase your chance of falling. Ask your doctor what other things that you can do to help prevent falls. This information is not intended to replace advice given to you by your health care provider. Make sure you discuss any questions you have with your health care provider. Document Released: 07/25/2009 Document Revised: 03/05/2016 Document Reviewed: 11/02/2014 Elsevier Interactive Patient Education  2017 [employer].

## 2020-01-22 ENCOUNTER — Other Ambulatory Visit: Payer: Self-pay | Admitting: Family Medicine

## 2020-01-22 ENCOUNTER — Other Ambulatory Visit: Payer: Self-pay

## 2020-01-22 ENCOUNTER — Other Ambulatory Visit (INDEPENDENT_AMBULATORY_CARE_PROVIDER_SITE_OTHER): Payer: PPO

## 2020-01-22 DIAGNOSIS — E785 Hyperlipidemia, unspecified: Secondary | ICD-10-CM | POA: Diagnosis not present

## 2020-01-22 DIAGNOSIS — E119 Type 2 diabetes mellitus without complications: Secondary | ICD-10-CM | POA: Diagnosis not present

## 2020-01-22 DIAGNOSIS — E538 Deficiency of other specified B group vitamins: Secondary | ICD-10-CM

## 2020-01-22 DIAGNOSIS — N1832 Chronic kidney disease, stage 3b: Secondary | ICD-10-CM

## 2020-01-22 LAB — LIPID PANEL
Cholesterol: 115 mg/dL (ref 0–200)
HDL: 37.5 mg/dL — ABNORMAL LOW (ref >39.00)
LDL Cholesterol: 70 mg/dL (ref 0–99)
NonHDL: 77.34
Total CHOL/HDL Ratio: 3
Triglycerides: 35 mg/dL (ref 0.0–149.0)
VLDL: 7 mg/dL (ref 0.0–40.0)

## 2020-01-22 LAB — COMPREHENSIVE METABOLIC PANEL
ALT: 11 U/L (ref 0–53)
AST: 16 U/L (ref 0–37)
Albumin: 4 g/dL (ref 3.5–5.2)
Alkaline Phosphatase: 28 U/L — ABNORMAL LOW (ref 39–117)
BUN: 25 mg/dL — ABNORMAL HIGH (ref 6–23)
CO2: 29 mEq/L (ref 19–32)
Calcium: 9 mg/dL (ref 8.4–10.5)
Chloride: 102 mEq/L (ref 96–112)
Creatinine, Ser: 1.78 mg/dL — ABNORMAL HIGH (ref 0.40–1.50)
GFR: 37.03 mL/min — ABNORMAL LOW (ref >60.00)
Glucose, Bld: 100 mg/dL — ABNORMAL HIGH (ref 70–99)
Potassium: 4.7 mEq/L (ref 3.5–5.1)
Sodium: 137 mEq/L (ref 135–145)
Total Bilirubin: 0.7 mg/dL (ref 0.2–1.2)
Total Protein: 6 g/dL (ref 6.0–8.3)

## 2020-01-22 LAB — CBC WITH DIFFERENTIAL/PLATELET
Basophils Absolute: 0 10*3/uL (ref 0.0–0.1)
Basophils Relative: 0.5 % (ref 0.0–3.0)
Eosinophils Absolute: 0.1 10*3/uL (ref 0.0–0.7)
Eosinophils Relative: 1 % (ref 0.0–5.0)
HCT: 41.3 % (ref 39.0–52.0)
Hemoglobin: 13.7 g/dL (ref 13.0–17.0)
Lymphocytes Relative: 21.6 % (ref 12.0–46.0)
Lymphs Abs: 1.4 10*3/uL (ref 0.7–4.0)
MCHC: 33.2 g/dL (ref 30.0–36.0)
MCV: 93.8 fl (ref 78.0–100.0)
Monocytes Absolute: 0.6 10*3/uL (ref 0.1–1.0)
Monocytes Relative: 9.3 % (ref 3.0–12.0)
Neutro Abs: 4.5 10*3/uL (ref 1.4–7.7)
Neutrophils Relative %: 67.6 % (ref 43.0–77.0)
Platelets: 215 10*3/uL (ref 150.0–400.0)
RBC: 4.41 Mil/uL (ref 4.22–5.81)
RDW: 13.9 % (ref 11.5–15.5)
WBC: 6.6 10*3/uL (ref 4.0–10.5)

## 2020-01-22 LAB — VITAMIN B12: Vitamin B-12: 426 pg/mL (ref 211–911)

## 2020-01-22 LAB — HEMOGLOBIN A1C: Hgb A1c MFr Bld: 6.5 % (ref 4.6–6.5)

## 2020-01-22 LAB — VITAMIN D 25 HYDROXY (VIT D DEFICIENCY, FRACTURES): VITD: 50.34 ng/mL (ref 30.00–100.00)

## 2020-01-26 DIAGNOSIS — M25551 Pain in right hip: Secondary | ICD-10-CM | POA: Diagnosis not present

## 2020-01-26 DIAGNOSIS — M79641 Pain in right hand: Secondary | ICD-10-CM | POA: Diagnosis not present

## 2020-01-31 DIAGNOSIS — M25641 Stiffness of right hand, not elsewhere classified: Secondary | ICD-10-CM | POA: Diagnosis not present

## 2020-02-03 ENCOUNTER — Other Ambulatory Visit: Payer: Self-pay | Admitting: Family Medicine

## 2020-02-07 ENCOUNTER — Encounter: Payer: Self-pay | Admitting: Family Medicine

## 2020-02-07 ENCOUNTER — Other Ambulatory Visit: Payer: Self-pay

## 2020-02-07 ENCOUNTER — Ambulatory Visit (INDEPENDENT_AMBULATORY_CARE_PROVIDER_SITE_OTHER): Payer: PPO | Admitting: Family Medicine

## 2020-02-07 VITALS — BP 122/66 | HR 79 | Temp 97.6°F | Ht 73.0 in | Wt 247.3 lb

## 2020-02-07 DIAGNOSIS — E669 Obesity, unspecified: Secondary | ICD-10-CM | POA: Diagnosis not present

## 2020-02-07 DIAGNOSIS — E538 Deficiency of other specified B group vitamins: Secondary | ICD-10-CM | POA: Diagnosis not present

## 2020-02-07 DIAGNOSIS — I10 Essential (primary) hypertension: Secondary | ICD-10-CM

## 2020-02-07 DIAGNOSIS — K760 Fatty (change of) liver, not elsewhere classified: Secondary | ICD-10-CM | POA: Diagnosis not present

## 2020-02-07 DIAGNOSIS — K219 Gastro-esophageal reflux disease without esophagitis: Secondary | ICD-10-CM | POA: Diagnosis not present

## 2020-02-07 DIAGNOSIS — E1169 Type 2 diabetes mellitus with other specified complication: Secondary | ICD-10-CM

## 2020-02-07 DIAGNOSIS — E785 Hyperlipidemia, unspecified: Secondary | ICD-10-CM | POA: Diagnosis not present

## 2020-02-07 DIAGNOSIS — Z0000 Encounter for general adult medical examination without abnormal findings: Secondary | ICD-10-CM

## 2020-02-07 DIAGNOSIS — E1122 Type 2 diabetes mellitus with diabetic chronic kidney disease: Secondary | ICD-10-CM | POA: Diagnosis not present

## 2020-02-07 DIAGNOSIS — N183 Chronic kidney disease, stage 3 unspecified: Secondary | ICD-10-CM | POA: Diagnosis not present

## 2020-02-07 DIAGNOSIS — E118 Type 2 diabetes mellitus with unspecified complications: Secondary | ICD-10-CM

## 2020-02-07 MED ORDER — PANTOPRAZOLE SODIUM 40 MG PO TBEC: 40.0000 mg | DELAYED_RELEASE_TABLET | Freq: Every day | ORAL | 3 refills | Status: DC

## 2020-02-07 MED ORDER — LOVASTATIN 40 MG PO TABS: 40.0000 mg | ORAL_TABLET | ORAL | 3 refills | Status: DC

## 2020-02-07 MED ORDER — VITAMIN D 125 MCG (5000 UT) PO CAPS: 1.0000 | ORAL_CAPSULE | ORAL | Status: DC

## 2020-02-07 NOTE — Progress Notes (Signed)
This visit was conducted in person.  BP 122/66 (BP Location: Left Arm, Patient Position: Sitting, Cuff Size: Large)   Pulse 79   Temp 97.6 F (36.4 C) (Temporal)   Ht 6\' 1"  (1.854 m)   Wt 247 lb 5 oz (112.2 kg)   SpO2 96%   BMI 32.63 kg/m    CC: CPE Subjective:    Patient ID: Luis Sweeney, male    DOB: 1941/05/25, 79 y.o.   MRN: [id number]  HPI: Luis Sweeney is a 79 y.o. male presenting on 02/07/2020 for Annual Exam (Prt 2. )   Saw health advisor earlier this month for medicare wellness visit. Note reviewed.   No exam data present    Clinical Support from 01/19/2020 in [place] at [facility] Total Score  0      Fall Risk  01/19/2020 12/12/2018 03/03/2017 03/03/2017 02/20/2016  Falls in the past year? 0 0 No No No  Number falls in past yr: 0 - - - -  Injury with Fall? 0 - - - -  Risk for fall due to : Medication side effect - - - -  Follow up Falls evaluation completed;Falls prevention discussed - - - -      H/o H pylori PUD now on protonix 40mg  daily.   Preventative: ESOPHAGOGASTRODUODENOSCOPY Date: 10//2015 duodenal stricture, ?recurrent ulcer [name])  COLONOSCOPY Date: 07/2014 severe diverticulosis, no rpt needed [name]) Prostate cancer screening - last checked 05/2013. No problems in past. Nocturia x1 per night. Strong stream. Will age out. Lung cancer screening - not eligible  Flu - declines  Pneumovax 2004, 2009. prevnar 2016  Td - 05/2012  Shingrix - declines  COVID vaccine - completed Moderna 12/2019  Advanced directives: doesn't have set up. Wouldn't want prolonged life support. Would want oldest daughter [name] RN) to be [employer].  Seat belt use discussed  Sunscreen use discussed. 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 Interim medical history since our last visit reviewed. Allergies and medications reviewed and updated. Outpatient Medications Prior to Visit  Medication Sig Dispense Refill  . aspirin EC 81 MG tablet Take 81 mg by mouth daily.    . ramipril (ALTACE) 5 MG capsule TAKE 1 CAPSULE BY MOUTH TWICE DAILY 180 capsule 3  . vitamin B-12 (CYANOCOBALAMIN) 1000 MCG tablet Take 1,000 mcg by mouth daily.    . Cholecalciferol (VITAMIN D3) 125 MCG (5000 UT) CAPS Take 1 capsule by mouth once a week.    . lovastatin (MEVACOR) 40 MG tablet Take 1 tablet (40 mg total) by mouth at bedtime. 90 tablet 3  . pantoprazole (PROTONIX) 40 MG tablet TAKE 1 TABLET BY MOUTH ONCE DAILY 90 tablet 0  . Cholecalciferol (VITAMIN D3) 1000 units CAPS Take 1 capsule (1,000 Units total) by mouth daily. 30 capsule    No facility-administered medications prior to visit.     Per HPI unless specifically indicated in ROS section below Review of Systems  Constitutional: Positive for appetite change (increased after steroid shot and prednisone taper). Negative for activity change, chills, fatigue, fever and unexpected weight change.  HENT: Negative for hearing loss.   Eyes: Positive for visual disturbance.  Respiratory: Negative for cough, chest tightness, shortness of breath and wheezing.   Cardiovascular: Negative for chest pain, palpitations and leg swelling (occaisonal ankles).  Gastrointestinal: Negative for abdominal distention, abdominal pain, blood in stool, constipation, diarrhea, nausea and vomiting.  Genitourinary: Negative for difficulty urinating and hematuria.  Musculoskeletal: Negative for arthralgias, myalgias and neck pain.   Skin: Negative for rash.  Neurological: Negative for dizziness, seizures, syncope and headaches.  Hematological: Negative for adenopathy. Does not bruise/bleed easily.  Psychiatric/Behavioral: Negative for dysphoric mood. The patient is not nervous/anxious.    Objective:  BP 122/66 (BP Location: Left Arm, Patient Position: Sitting, Cuff Size: Large)   Pulse 79   Temp 97.6 F (36.4 C) (Temporal)   Ht 6\' 1"  (1.854 m)   Wt 247 lb 5 oz (112.2 kg)   SpO2 96%   BMI 32.63 kg/m   Wt Readings from Last 3 Encounters:  02/07/20 247 lb 5 oz (112.2 kg)  01/19/20 252 lb (114.3 kg)  09/20/19 250 lb 3.2 oz (113.5 kg)      Physical Exam Vitals and nursing note reviewed.  Constitutional:      General: He is not in acute distress.    Appearance: Normal appearance. He is well-developed. He is not ill-appearing.  HENT:     Head: Normocephalic and atraumatic.     Right Ear: Hearing, tympanic membrane, ear canal and external ear normal.     Left Ear: Hearing, tympanic membrane, ear canal and external ear normal.  Eyes:     General: No scleral icterus.    Extraocular Movements: Extraocular movements intact.     Conjunctiva/sclera: Conjunctivae normal.     Pupils: Pupils are equal, round, and reactive to light.  Cardiovascular:     Rate and Rhythm: Normal rate and regular rhythm.     Pulses: Normal pulses.          Radial pulses are 2+ on the right side and 2+ on the left side.     Heart sounds: Normal heart sounds. No murmur.  Pulmonary:     Effort: Pulmonary effort is normal. No respiratory distress.     Breath sounds: Normal breath sounds. No wheezing, rhonchi or rales.  Abdominal:     General: Abdomen is flat. Bowel sounds are normal. There is no distension.     Palpations: Abdomen is soft. There is no mass.     Tenderness: There is no abdominal tenderness. There is no guarding or rebound.     Hernia: No hernia is present.  Musculoskeletal:        General: Normal range of motion.      Cervical back: Normal range of motion and neck supple.     Right lower leg: No edema.     Left lower leg: No edema.     Comments: Chronic R hand deformity  Lymphadenopathy:     Cervical: No cervical adenopathy.  Skin:    General: Skin is warm and dry.     Findings: No rash.  Neurological:     General: No focal deficit present.     Mental Status: He is alert and oriented to person, place, and time.     Comments: CN grossly intact, station and gait intact  Psychiatric:        Mood  and Affect: Mood normal.        Behavior: Behavior normal.        Thought Content: Thought content normal.        Judgment: Judgment normal.       Results for orders placed or performed in visit on 01/22/20  CBC with Differential/Platelet  Result Value Ref Range   WBC 6.6 4.0 - 10.5 K/uL   RBC 4.41 4.22 - 5.81 Mil/uL   Hemoglobin 13.7 13.0 - 17.0 g/dL   HCT 41.3 39.0 - 52.0 %   MCV 93.8 78.0 - 100.0 fl   MCHC 33.2 30.0 - 36.0 g/dL   RDW 13.9 11.5 - 15.5 %   Platelets 215.0 150.0 - 400.0 K/uL   Neutrophils Relative % 67.6 43.0 - 77.0 %   Lymphocytes Relative 21.6 12.0 - 46.0 %   Monocytes Relative 9.3 3.0 - 12.0 %   Eosinophils Relative 1.0 0.0 - 5.0 %   Basophils Relative 0.5 0.0 - 3.0 %   Neutro Abs 4.5 1.4 - 7.7 K/uL   Lymphs Abs 1.4 0.7 - 4.0 K/uL   Monocytes Absolute 0.6 0.1 - 1.0 K/uL   Eosinophils Absolute 0.1 0.0 - 0.7 K/uL   Basophils Absolute 0.0 0.0 - 0.1 K/uL  VITAMIN D 25 Hydroxy (Vit-D Deficiency, Fractures)  Result Value Ref Range   VITD 50.34 30.00 - 100.00 ng/mL  Vitamin B12  Result Value Ref Range   Vitamin B-12 426 211 - 911 pg/mL  Hemoglobin A1c  Result Value Ref Range   Hgb A1c MFr Bld 6.5 4.6 - 6.5 %  Comprehensive metabolic panel  Result Value Ref Range   Sodium 137 135 - 145 mEq/L   Potassium 4.7 3.5 - 5.1 mEq/L   Chloride 102 96 - 112 mEq/L   CO2 29 19 - 32 mEq/L   Glucose, Bld 100 (H) 70 - 99 mg/dL   BUN 25 (H) 6 - 23 mg/dL   Creatinine, Ser 1.78 (H) 0.40 -  1.50 mg/dL   Total Bilirubin 0.7 0.2 - 1.2 mg/dL   Alkaline Phosphatase 28 (L) 39 - 117 U/L   AST 16 0 - 37 U/L   ALT 11 0 - 53 U/L   Total Protein 6.0 6.0 - 8.3 g/dL   Albumin 4.0 3.5 - 5.2 g/dL   GFR 37.03 (L) >60.00 mL/min   Calcium 9.0 8.4 - 10.5 mg/dL  Lipid panel  Result Value Ref Range   Cholesterol 115 0 - 200 mg/dL   Triglycerides 35.0 0.0 - 149.0 mg/dL   HDL 37.50 (L) >39.00 mg/dL   VLDL 7.0 0.0 - 40.0 mg/dL   LDL Cholesterol 70 0 - 99 mg/dL   Total CHOL/HDL Ratio 3    NonHDL 77.34    Lab Results  Component Value Date   PSA 1.00 03/01/2017   PSA 1.16 02/20/2016   PSA 0.99 05/25/2013    Assessment & Plan:  This visit occurred during the SARS-CoV-2 public health emergency.  Safety protocols were in place, including screening questions prior to the visit, additional usage of staff PPE, and extensive cleaning of exam room while observing appropriate contact time as indicated for disinfecting solutions.   Problem List Items Addressed This Visit    Vitamin B12 deficiency    Continues daily replacement.      Obesity, Class I, BMI 30-34.9    Encouraged healthy diet choices      NAFLD (nonalcoholic fatty liver disease)   Hyperlipidemia associated with type 2 diabetes mellitus (  HCC)    Chronic, stable on lovastatin. He takes MWF.  The ASCVD Risk score Mikey Bussing DC Jr., et al., 2013) failed to calculate for the following reasons:   The patient has a prior MI or stroke diagnosis       Relevant Medications   lovastatin (MEVACOR) 40 MG tablet   Health maintenance examination - Primary    Preventative protocols reviewed and updated unless pt declined. Discussed healthy diet and lifestyle.       GERD    Continues pantoprazole daily.  H/o H pylori PUD      Relevant Medications   pantoprazole (PROTONIX) 40 MG tablet   Essential hypertension    Chronic, stable. Continue current regimen.       Relevant Medications   lovastatin (MEVACOR) 40 MG tablet   Controlled  diabetes mellitus type 2 with complications (HCC)    Chronic, stable. Continue to monitor with cautious diet       Relevant Medications   lovastatin (MEVACOR) 40 MG tablet   CKD stage 3 due to type 2 diabetes mellitus ([place])    Chronic since ~2013. Continue to monitor closely. Encouraged good hydration status. Reviewed renal imaging from 2011, 2015.       Relevant Medications   lovastatin (MEVACOR) 40 MG tablet       Meds ordered this encounter  Medications  . lovastatin (MEVACOR) 40 MG tablet    Sig: Take 1 tablet (40 mg total) by mouth every Monday, Wednesday, and Friday.    Dispense:  40 tablet    Refill:  3  . pantoprazole (PROTONIX) 40 MG tablet    Sig: Take 1 tablet (40 mg total) by mouth daily.    Dispense:  90 tablet    Refill:  3  . Cholecalciferol (VITAMIN D) 125 MCG (5000 UT) CAPS    Sig: Take 1 capsule by mouth once a week.   No orders of the defined types were placed in this encounter.   Follow up plan: Return in about 1 year (around 02/06/2021) for annual exam, prior fasting for blood work, medicare wellness visit.  Ria Bush, MD

## 2020-02-07 NOTE — Assessment & Plan Note (Signed)
Preventative protocols reviewed and updated unless pt declined. Discussed healthy diet and lifestyle.  

## 2020-02-07 NOTE — Assessment & Plan Note (Signed)
Chronic, stable. Continue to monitor with cautious diet

## 2020-02-07 NOTE — Patient Instructions (Addendum)
Increase water intake. Back off diet sodas.  Stay well hydrated.  Return in 1 year for wellness visit.   Health Maintenance After Age 79 After age 53, you are at a higher risk for certain long-term diseases and infections as well as injuries from falls. Falls are a major cause of broken bones and head injuries in people who are older than age 39. Getting regular preventive care can help to keep you healthy and well. Preventive care includes getting regular testing and making lifestyle changes as recommended by your health care provider. Talk with your health care provider about:  Which screenings and tests you should have. A screening is a test that checks for a disease when you have no symptoms.  A diet and exercise plan that is right for you. What should I know about screenings and tests to prevent falls? Screening and testing are the best ways to find a health problem early. Early diagnosis and treatment give you the best chance of managing medical conditions that are common after age 12. Certain conditions and lifestyle choices may make you more likely to have a fall. Your health care provider may recommend:  Regular vision checks. Poor vision and conditions such as cataracts can make you more likely to have a fall. If you wear glasses, make sure to get your prescription updated if your vision changes.  Medicine review. Work with your health care provider to regularly review all of the medicines you are taking, including over-the-counter medicines. Ask your health care provider about any side effects that may make you more likely to have a fall. Tell your health care provider if any medicines that you take make you feel dizzy or sleepy.  Osteoporosis screening. Osteoporosis is a condition that causes the bones to get weaker. This can make the bones weak and cause them to break more easily.  Blood pressure screening. Blood pressure changes and medicines to control blood pressure can make you  feel dizzy.  Strength and balance checks. Your health care provider may recommend certain tests to check your strength and balance while standing, walking, or changing positions.  Foot health exam. Foot pain and numbness, as well as not wearing proper footwear, can make you more likely to have a fall.  Depression screening. You may be more likely to have a fall if you have a fear of falling, feel emotionally low, or feel unable to do activities that you used to do.  Alcohol use screening. Using too much alcohol can affect your balance and may make you more likely to have a fall. What actions can I take to lower my risk of falls? General instructions  Talk with your health care provider about your risks for falling. Tell your health care provider if: ? You fall. Be sure to tell your health care provider about all falls, even ones that seem minor. ? You feel dizzy, sleepy, or off-balance.  Take over-the-counter and prescription medicines only as told by your health care provider. These include any supplements.  Eat a healthy diet and maintain a healthy weight. A healthy diet includes low-fat dairy products, low-fat (lean) meats, and fiber from whole grains, beans, and lots of fruits and vegetables. Home safety  Remove any tripping hazards, such as rugs, cords, and clutter.  Install safety equipment such as grab bars in bathrooms and safety rails on stairs.  Keep rooms and walkways well-lit. Activity   Follow a regular exercise program to stay fit. This will help you maintain your  balance. Ask your health care provider what types of exercise are appropriate for you.  If you need a cane or walker, use it as recommended by your health care provider.  Wear supportive shoes that have nonskid soles. Lifestyle  Do not drink alcohol if your health care provider tells you not to drink.  If you drink alcohol, limit how much you have: ? 0-1 drink a day for women. ? 0-2 drinks a day for  men.  Be aware of how much alcohol is in your drink. In the [place] one drink equals one typical bottle of beer (12 oz), one-half glass of wine (5 oz), or one shot of hard liquor (1 oz).  Do not use any products that contain nicotine or tobacco, such as cigarettes and e-cigarettes. If you need help quitting, ask your health care provider. Summary  Having a healthy lifestyle and getting preventive care can help to protect your health and wellness after age 18.  Screening and testing are the best way to find a health problem early and help you avoid having a fall. Early diagnosis and treatment give you the best chance for managing medical conditions that are more common for people who are older than age 33.  Falls are a major cause of broken bones and head injuries in people who are older than age 54. Take precautions to prevent a fall at home.  Work with your health care provider to learn what changes you can make to improve your health and wellness and to prevent falls. This information is not intended to replace advice given to you by your health care provider. Make sure you discuss any questions you have with your health care provider. Document Revised: 01/19/2019 Document Reviewed: 08/11/2017 Elsevier Patient Education  2020 [employer].

## 2020-02-07 NOTE — Assessment & Plan Note (Signed)
Continues daily replacement 

## 2020-02-07 NOTE — Assessment & Plan Note (Signed)
Encouraged healthy diet choices.  

## 2020-02-07 NOTE — Assessment & Plan Note (Signed)
Chronic since ~2013. Continue to monitor closely. Encouraged good hydration status. Reviewed renal imaging from 2011, 2015.

## 2020-02-07 NOTE — Assessment & Plan Note (Signed)
Chronic, stable on lovastatin. He takes MWF.  The ASCVD Risk score Mikey Bussing DC Jr., et al., 2013) failed to calculate for the following reasons:   The patient has a prior MI or stroke diagnosis

## 2020-02-07 NOTE — Assessment & Plan Note (Signed)
Chronic, stable. Continue current regimen. 

## 2020-02-07 NOTE — Assessment & Plan Note (Signed)
Continues pantoprazole daily.  H/o H pylori PUD

## 2020-02-14 DIAGNOSIS — M25551 Pain in right hip: Secondary | ICD-10-CM | POA: Diagnosis not present

## 2020-02-14 DIAGNOSIS — M25641 Stiffness of right hand, not elsewhere classified: Secondary | ICD-10-CM | POA: Diagnosis not present

## 2020-02-26 DIAGNOSIS — H2513 Age-related nuclear cataract, bilateral: Secondary | ICD-10-CM | POA: Diagnosis not present

## 2020-04-27 DIAGNOSIS — M25551 Pain in right hip: Secondary | ICD-10-CM | POA: Diagnosis not present

## 2020-05-03 DIAGNOSIS — J069 Acute upper respiratory infection, unspecified: Secondary | ICD-10-CM | POA: Diagnosis not present

## 2020-05-03 DIAGNOSIS — R05 Cough: Secondary | ICD-10-CM | POA: Diagnosis not present

## 2020-05-10 DIAGNOSIS — M25551 Pain in right hip: Secondary | ICD-10-CM | POA: Diagnosis not present

## 2020-06-07 ENCOUNTER — Telehealth: Payer: Self-pay | Admitting: Family Medicine

## 2020-06-07 DIAGNOSIS — M1611 Unilateral primary osteoarthritis, right hip: Secondary | ICD-10-CM

## 2020-06-07 DIAGNOSIS — K4091 Unilateral inguinal hernia, without obstruction or gangrene, recurrent: Secondary | ICD-10-CM

## 2020-06-07 NOTE — Telephone Encounter (Signed)
Patient called in stating he has met with the orthopedic doctor but has found out that an old hernia has reappeared. Patient is wondering if he can get a recommendation from PCP who he would recommend for this or if he needs to have a follow up. Please advise.

## 2020-06-07 NOTE — Telephone Encounter (Signed)
What kind of hernia was this?  Can we get records of ortho visit to review?

## 2020-06-07 NOTE — Telephone Encounter (Addendum)
Spoke with pt asking about hernia.  States it was a groin hernia.  Also, pt will have ortho fax office note and MRI.

## 2020-06-19 DIAGNOSIS — M1611 Unilateral primary osteoarthritis, right hip: Secondary | ICD-10-CM | POA: Insufficient documentation

## 2020-06-19 NOTE — Telephone Encounter (Signed)
Spoke with pt relaying Dr. Synthia Innocent message.  Pt verbalizes understanding.  States he has bulging and pain when he stands.  Also, has pain when sitting for awhile.  Pt will pick up CD.   [Placed CD at front office- in box.]

## 2020-06-19 NOTE — Addendum Note (Signed)
Addended by: Ria Bush on: 06/19/2020 07:58 AM   Modules accepted: Orders

## 2020-06-19 NOTE — Telephone Encounter (Addendum)
Received CD with recent R hip MRI as well as report - showing moderate sized fat containing R inguinal hernia.  H/o RIH repair 2006 [facility] Done).  Is he having any symptoms from this? Does he have bulge in that area when standing, or any pain?  Would offer return to [facility] for further evaluation. Referral placed.   I've uploaded his hip MRI into our system. plz check with patient to see if he wants CD returned to him or if we should discard (placed in Lisa's box).

## 2020-07-08 ENCOUNTER — Telehealth: Payer: Self-pay | Admitting: *Deleted

## 2020-07-08 ENCOUNTER — Ambulatory Visit: Payer: Self-pay | Admitting: Surgery

## 2020-07-08 DIAGNOSIS — K4091 Unilateral inguinal hernia, without obstruction or gangrene, recurrent: Secondary | ICD-10-CM | POA: Diagnosis not present

## 2020-07-08 NOTE — Telephone Encounter (Signed)
° °  [facility] Pre-operative Risk Assessment    HEARTCARE STAFF: - Please ensure there is not already an duplicate clearance open for this procedure. - Under Visit Info/Reason for Call, type in Other and utilize the format Clearance MM/DD/YY or Clearance TBD. Do not use dashes or single digits. - If request is for dental extraction, please clarify the # of teeth to be extracted.  Request for surgical clearance:  1. What type of surgery is being performed? HERNIA REPAIR   2. When is this surgery scheduled? TBD   3. What type of clearance is required (medical clearance vs. Pharmacy clearance to hold med vs. Both)? MEDICAL  4. Are there any medications that need to be held prior to surgery and how long? ASA   5. Practice name and name of physician performing surgery? [facility]; DR. Brantley Stage   6. What is the office phone number? [phone]   7.   What is the office fax number? Benjamin Perez: [place], CMA  8.   Anesthesia type (None, local, MAC, general) ? GENERAL   Julaine Hua 07/08/2020, 11:27 AM  _________________________________________________________________   (provider comments below)

## 2020-07-08 NOTE — H&P (Signed)
[name] Appointment: 07/08/2020 10:10 AM Location: [facility] Patient #: [id number] DOB: July 01, 1941 Married / Language: Cleophus Molt / Race: White Male  History of Present Illness Marcello Moores A. Dorman Calderwood MD; 07/08/2020 12:10 PM) Patient words: Patient returns for evaluation of recurrent right inguinal hernia. The last 2 months the patient is noted a bulge and pain in his right groin. He has a history of right inguinal hernia. Mesh over 20 years ago. The area is causing mild to moderate discomfort especially with lifting, pushing or pulling. The area is sore but the pain overall is a 2-3 out of 10 he states. He has had hip problems and is evaluated by orthopaedics for that. He brought an magnetic resonance imaging images with him today which I reviewed which shows most of the hip without enough evaluation of the right groin to make any determination. Patient denies change of bowel or bladder function.  The patient is a 79 year old male.   Past Surgical History Andreas Blower, CMA; 07/08/2020 10:18 AM) Colon Polyp Removal - Colonoscopy Knee Surgery Left. Open Inguinal Hernia Surgery Right. Oral Surgery Shoulder Surgery Right.  Diagnostic Studies History Andreas Blower, CMA; 07/08/2020 10:18 AM) Colonoscopy 5-10 years ago  Allergies (Armen Ferguson, CMA; 07/08/2020 10:19 AM) Morphine Sulfate (PF) *ANALGESICS - OPIOID* Morphine Derivatives Codeine and Related  Medication History (Armen Ferguson, CMA; 07/08/2020 10:20 AM) Lovastatin (40MG  Tablet, Oral) Active. Ramipril (5MG  Capsule, Oral) Active. Pantoprazole Sodium (40MG  Tablet DR, Oral) Active. Aspirin (81MG  Tablet, Oral) Active. Vitamin B-12 (1000MCG Tablet, Oral) Active. Vitamin D (1000UNIT Tablet, Oral) Active. Medications Reconciled  Other Problems (Armen Ferguson, CMA; 07/08/2020 10:18 AM) Arthritis Gastric Ulcer Myocardial infarction     Review of Systems (Armen Ferguson CMA; 07/08/2020 10:18  AM) General Present- Night Sweats and Weight Gain. Not Present- Appetite Loss, Chills, Fatigue, Fever and Weight Loss. Skin Present- Dryness. Not Present- Change in Wart/Mole, Hives, Jaundice, New Lesions, Non-Healing Wounds, Rash and Ulcer. HEENT Present- Wears glasses/contact lenses. Not Present- Earache, Hearing Loss, Hoarseness, Nose Bleed, Oral Ulcers, Ringing in the Ears, Seasonal Allergies, Sinus Pain, Sore Throat, Visual Disturbances and Yellow Eyes. Breast Not Present- Breast Mass, Breast Pain, Nipple Discharge and Skin Changes. Cardiovascular Present- Leg Cramps and Swelling of Extremities. Not Present- Chest Pain, Difficulty Breathing Lying Down, Palpitations, Rapid Heart Rate and Shortness of Breath. Gastrointestinal Present- Constipation and Hemorrhoids. Not Present- Abdominal Pain, Bloating, Bloody Stool, Change in Bowel Habits, Chronic diarrhea, Difficulty Swallowing, Excessive gas, Gets full quickly at meals, Indigestion, Nausea, Rectal Pain and Vomiting. Male Genitourinary Present- Urine Leakage. Not Present- Blood in Urine, Change in Urinary Stream, Frequency, Impotence, Nocturia, Painful Urination and Urgency. Musculoskeletal Present- Joint Pain, Joint Stiffness and Muscle Weakness. Not Present- Back Pain, Muscle Pain and Swelling of Extremities. Neurological Not Present- Decreased Memory, Fainting, Headaches, Numbness, Seizures, Tingling, Tremor, Trouble walking and Weakness. Psychiatric Not Present- Anxiety, Bipolar, Change in Sleep Pattern, Depression, Fearful and Frequent crying. Endocrine Present- Cold Intolerance and Heat Intolerance. Not Present- Excessive Hunger, Hair Changes, Hot flashes and New Diabetes. Hematology Present- Easy Bruising. Not Present- Blood Thinners, Excessive bleeding, Gland problems, HIV and Persistent Infections.  Vitals (Armen Ferguson CMA; 07/08/2020 10:19 AM) 07/08/2020 10:18 AM Weight: 250.5 lb Height: 74in Body Surface Area: 2.39 m Body Mass  Index: 32.16 kg/m  Temp.: 98.31F  Pulse: 88 (Regular)  P.OX: 96% (Room air) BP: 120/70(Sitting, Left Arm, Standard)        Physical Exam (Pharrah Rottman A. Ndia Sampath MD; 07/08/2020 12:13 PM)  General Mental Status-Alert.  General Appearance-Consistent with stated age. Hydration-Well hydrated. Voice-Normal.  Chest and Lung Exam Chest and lung exam reveals -quiet, even and easy respiratory effort with no use of accessory muscles and on auscultation, normal breath sounds, no adventitious sounds and normal vocal resonance. Inspection Chest Wall - Normal. Back - normal.  Cardiovascular Cardiovascular examination reveals -normal heart sounds, regular rate and rhythm with no murmurs and normal pedal pulses bilaterally.  Abdomen Note: Right groin scar noted. Small reducible right inguinal hernia. No evidence of left inguinal hernia. Soft nontender without rebound or guarding.  Neurologic Note: No acute deficit Right arm small and the left arm with significant atrophy and disuse  Musculoskeletal Note: Right arm smaller the left arm. Significant atrophy of disuse noted.    Assessment & Plan (Urania Pearlman A. Christepher Melchior MD; 07/08/2020 12:14 PM)  RECURRENT RIGHT INGUINAL HERNIA (K40.91) Impression: Discuss repair techniques and the use of mesh for recurrent right inguinal hernia. Discussed laparoscopic and open techniques and the use of mesh and the rationale for mesh used. Discussed potential complications of mesh use a long-term recovery and expectation. Discussed risks and benefits and general recommended a right open inguinal hernia with mesh. The risk of hernia repair include bleeding, infection, organ injury, bowel injury, bladder injury, nerve injury recurrent hernia, blood clots, worsening of underlying condition, chronic pain, mesh use, open surgery, death, and the need for other operations. Pt agrees to proceed  Current Plans You are being scheduled for surgery- Our  schedulers will call you.  You should hear from our office's scheduling department within 5 working days about the location, date, and time of surgery. We try to make accommodations for patient's preferences in scheduling surgery, but sometimes the OR schedule or the surgeon's schedule prevents [place] from making those accommodations.  If you have not heard from our office [phone]) in 5 working days, call the office and ask for your surgeon's nurse.  If you have other questions about your diagnosis, plan, or surgery, call the office and ask for your surgeon's nurse.  Pt Education - Pamphlet Given - Hernia Surgery: discussed with patient and provided information. Pt Education - Consent for inguinal hernia : discussed with patient and provided information.

## 2020-07-09 NOTE — Telephone Encounter (Signed)
Left patient a message to return call to the office  

## 2020-07-09 NOTE — Telephone Encounter (Signed)
   Primary Cardiologist: Sherren Mocha, MD  Chart reviewed as part of pre-operative protocol coverage. Because of FLEM ENDERLE past medical history and time since last visit, they will require a follow-up visit in order to better assess preoperative cardiovascular risk.  Pre-op covering staff: - Please schedule appointment and call patient to inform them. If patient already had an upcoming appointment within acceptable timeframe, please add "pre-op clearance" to the appointment notes so provider is aware. - Please contact requesting surgeon's office via preferred method (i.e, phone, fax) to inform them of need for appointment prior to surgery.  If applicable, this message will also be routed to pharmacy pool and/or primary cardiologist for input on holding anticoagulant/antiplatelet agent as requested below so that this information is available to the clearing provider at time of patient's appointment.   [place], [place]  07/09/2020, 1:41 PM

## 2020-07-09 NOTE — Telephone Encounter (Signed)
Patient scheduled for a pre op appointment on 08/01/2020 with Robbie Lis, PA. Will fax to requesting surgeons office to make them aware and remove from pre op pool.

## 2020-07-24 DIAGNOSIS — M25551 Pain in right hip: Secondary | ICD-10-CM | POA: Diagnosis not present

## 2020-08-01 ENCOUNTER — Other Ambulatory Visit: Payer: Self-pay

## 2020-08-01 ENCOUNTER — Encounter: Payer: Self-pay | Admitting: Physician Assistant

## 2020-08-01 ENCOUNTER — Ambulatory Visit: Payer: PPO | Admitting: Physician Assistant

## 2020-08-01 VITALS — BP 124/60 | HR 85 | Ht 73.0 in | Wt 245.0 lb

## 2020-08-01 DIAGNOSIS — Z01818 Encounter for other preprocedural examination: Secondary | ICD-10-CM

## 2020-08-01 DIAGNOSIS — R0600 Dyspnea, unspecified: Secondary | ICD-10-CM | POA: Diagnosis not present

## 2020-08-01 DIAGNOSIS — R0609 Other forms of dyspnea: Secondary | ICD-10-CM

## 2020-08-01 DIAGNOSIS — I25110 Atherosclerotic heart disease of native coronary artery with unstable angina pectoris: Secondary | ICD-10-CM

## 2020-08-01 NOTE — Progress Notes (Signed)
Cardiology Office Note:    Date:  08/01/2020   ID:  LOUISE VICTORY, DOB 16-Sep-1941, MRN [id number]  PCP:  Ria Bush, MD  [facility] Cardiologist:  Sherren Mocha, MD  [facility] Electrophysiologist:  None   Chief Complaint: Surgical clearance for HERNIA REPAIR   History of Present Illness:    Luis Sweeney is a 79 y.o. male with a hx of CAD s/p DES to LAD in 2004, HTN, HLD, PUD, CKD and NASH presented for surgical clearance.   Last cath 06/2016 showed: 1. Widely patent proximal LAD stent with only mild in-stent restenosis 2. Moderate stenosis of a very small OM branch of the circumflex, otherwise widely patent circumflex 3. Angiographically normal right coronary artery  Suspect a noncardiac chest pain. Continue medical therapy for CAD.  Presented for surgical clearance.  No regular exercise.  Reported dyspnea with activity.  No associated chest tightness or heaviness.  He cannot remember prior anginal symptoms.  Denies orthopnea, PND, syncope, lower extremity edema or melena.  No palpitations.  Compliant with medications.  Past Medical History:  Diagnosis Date   Acute duodenal ulcer with hemorrhage but without obstruction    required  bld. transfusion   Anemia, iron deficiency    Arthritis    OA- pt. reports its. minor   CAD (coronary artery disease) 2004   a. s/p MI treated with drug-eluting stent placement to the proximal left anterior descending in late 2004   CKD (chronic kidney disease), stage III ([place])    Diverticulosis of colon    Duodenal stricture - recurrent 09/21/2014   Erb's palsy    birth trauma, pt. reports he was 11lbs. + at birth    GERD (gastroesophageal reflux disease)    H/O hiatal hernia    Helicobacter pylori gastritis    recurrent   Hypercholesteremia    Hyperglycemia    Hypertension    Myocardial infarct (HCC)    NAFLD (nonalcoholic fatty liver disease) 2015   with liver cysts   Paralysis of upper limb (HCC)    R  arm- birth injury   Personal history of colonic polyps    Urticaria    Lat left arm    Past Surgical History:  Procedure Laterality Date   ANTERIOR CERVICAL DECOMP/DISCECTOMY FUSION  03/2013   C3/4, C4/5 HNP (kritzer)   ANTERIOR CERVICAL DECOMP/DISCECTOMY FUSION N/A 03/15/2013   Procedure: ANTERIOR CERVICAL DECOMPRESSION/DISCECTOMY FUSION 2 LEVELS;  Surgeon: Faythe Ghee, MD;  Location: [facility];  Service: Neurosurgery;  Laterality: N/A;  Cervical three-four, cervical four-five anterior cervical diskectomy fusion with a trabecular metal plus plate    CARDIAC CATHETERIZATION  2006   stent placed   CARDIAC CATHETERIZATION N/A 07/08/2016   Procedure: Left Heart Cath and Coronary Angiography;  Surgeon: Sherren Mocha, MD;  Location: [facility];  Service: Cardiovascular;  Laterality: N/A;   COLONOSCOPY  multiple   diverticulosis, int hem, h/o adenomatous polyps [name])   COLONOSCOPY  07/2014   severe diverticulosis, no rpt needed [name])   CORONARY STENT PLACEMENT  2006   drug-eluting   ESOPHAGOGASTRODUODENOSCOPY  11/26/10   mild gastritis, duodenitis, duodenal stricture (Dr. [name])   ESOPHAGOGASTRODUODENOSCOPY  10//2015   duodenal stricture, ?recurrent ulcer [name])   HERNIA REPAIR Right 2006   Dr. Hassell Done   KNEE SURGERY     left   SHOULDER SURGERY     right   spinal cyst removal     WRIST SURGERY      Current Medications:  Current Meds  Medication Sig   aspirin EC 81 MG tablet Take 81 mg by mouth daily.   Cholecalciferol (VITAMIN D) 125 MCG (5000 UT) CAPS Take 1 capsule by mouth once a week.   lovastatin (MEVACOR) 40 MG tablet Take 1 tablet (40 mg total) by mouth every Monday, Wednesday, and Friday.   pantoprazole (PROTONIX) 40 MG tablet Take 1 tablet (40 mg total) by mouth daily.   ramipril (ALTACE) 5 MG capsule TAKE 1 CAPSULE BY MOUTH TWICE DAILY   vitamin B-12 (CYANOCOBALAMIN) 1000 MCG tablet Take 1,000 mcg by mouth daily.      Allergies:   Prednisone, Codeine, Lipitor [atorvastatin], Morphine, and Simvastatin   Social History   Socioeconomic History   Marital status: Married    Spouse name: Not on file   Number of children: 3   Years of education: Not on file   Highest education level: Not on file  Occupational History   Occupation: HT Warden/ranger: [employer]    Comment: metal Fabrication/Stairs/Rails commerial  Tobacco Use   Smoking status: Former Smoker    Packs/day: 1.50    Years: 3.00    Pack years: 4.50    Quit date: 02/16/1985    Years since quitting: 35.4   Smokeless tobacco: Never Used  Substance and Sexual Activity   Alcohol use: Yes    Alcohol/week: 0.0 standard drinks    Comment: two to three times a year   Drug use: No   Sexual activity: Never    Comment: last few years.  Other Topics Concern   Not on file  Social History Narrative   Daily caffeine use 3 per day   Lives with wife    Activity: no regular exercise   Diet: tries to avoid sweets, some water, fruits/vegetables occasionally   Social Determinants of Health   Financial Resource Strain: Low Risk    Difficulty of Paying Living Expenses: Not hard at all  Food Insecurity: No Food Insecurity   Worried About Charity fundraiser in the Last Year: Never true   Ran Out of Food in the Last Year: Never true  Transportation Needs: No Transportation Needs   Lack of Transportation (Medical): No   Lack of Transportation (Non-Medical): No  Physical Activity: Inactive   Days of Exercise per Week: 0 days   Minutes of Exercise per Session: 0 min  Stress: No Stress Concern Present   Feeling of Stress : Not at all  Social Connections:    Frequency of Communication with Friends and Family: Not on file   Frequency of Social Gatherings with Friends and Family: Not on file   Attends Religious Services: Not on file   Active Member of Clubs or Organizations: Not on file   Attends English as a second language teacher Meetings: Not on file   Marital Status: Not on file     Family History: The patient's family history includes Arthritis in his sister; Cancer in his sister; Diabetes in his father and sister; Heart disease in his father; Heart failure in his father; Hypertension in his sister and sister; Kidney disease in his father; Ovarian cancer in an other family member.    ROS:   Please see the history of present illness.    All other systems reviewed and are negative.   EKGs/Labs/Other Studies Reviewed:    The following studies were reviewed today: As summarized above  EKG:  EKG is ordered today.  The ekg ordered today demonstrates  normal sinus rhythm at rate of 85 bpm  Recent Labs: 01/22/2020: ALT 11; BUN 25; Creatinine, Ser 1.78; Hemoglobin 13.7; Platelets 215.0; Potassium 4.7; Sodium 137  Recent Lipid Panel    Component Value Date/Time   CHOL 115 01/22/2020 0930   CHOL 111 09/29/2018 1021   TRIG 35.0 01/22/2020 0930   HDL 37.50 (L) 01/22/2020 0930   HDL 39 (L) 09/29/2018 1021   CHOLHDL 3 01/22/2020 0930   VLDL 7.0 01/22/2020 0930   LDLCALC 70 01/22/2020 0930   LDLCALC 62 09/29/2018 1021    Physical Exam:    VS:  BP 124/60    Pulse 85    Ht 6\' 1"  (1.854 m)    Wt 245 lb (111.1 kg)    SpO2 95%    BMI 32.32 kg/m     Wt Readings from Last 3 Encounters:  08/01/20 245 lb (111.1 kg)  02/07/20 247 lb 5 oz (112.2 kg)  01/19/20 252 lb (114.3 kg)     GEN: Well nourished, well developed in no acute distress HEENT: Normal NECK: No JVD; No carotid bruits LYMPHATICS: No lymphadenopathy CARDIAC: RRR, no murmurs, rubs, gallops RESPIRATORY:  Clear to auscultation without rales, wheezing or rhonchi  ABDOMEN: Soft, non-tender, non-distended MUSCULOSKELETAL:  No edema; No deformity  SKIN: Warm and dry NEUROLOGIC:  Alert and oriented x 3 PSYCHIATRIC:  Normal affect   ASSESSMENT AND PLAN:    1. Dyspnea on exertion No regular exercise.  Symptoms could be due to  deconditioning however cannot completely rule out given underlying history of CAD and now requiring surgical clearance.  Recommended stress test.  Patient had bad experience with chemical stress test.  Plan exercise Myoview.  2.  CAD with LAD stenting -Last cardiac catheterization in 2017 showed patent stent with mild in-stent restenosis.  Continue aspirin and ramipril.  3.  Surgical clearance -Pending exercise Myoview.  Will ask Dr. Burt Knack if okay to hold aspirin.   Medication Adjustments/Labs and Tests Ordered: Current medicines are reviewed at length with the patient today.  Concerns regarding medicines are outlined above.  Orders Placed This Encounter  Procedures   MYOCARDIAL PERFUSION IMAGING   EKG 12-Lead   No orders of the defined types were placed in this encounter.   Patient Instructions  Medication Instructions:  Your physician recommends that you continue on your current medications as directed. Please refer to the Current Medication list given to you today.  *If you need a refill on your cardiac medications before your next appointment, please call your pharmacy*   Lab Work: None ordered  If you have labs (blood work) drawn today and your tests are completely normal, you will receive your results only by:  [place] (if you have MyChart) OR  A paper copy in the mail If you have any lab test that is abnormal or we need to change your treatment, we will call you to review the results.   Testing/Procedures: Your physician has requested that you have en exercise stress myoview. For further information please visit [email or web address]. Please follow instruction sheet, BELOW:    You are scheduled for a Exercise Stress Myocardial Perfusion Imaging Study.  Please arrive 15 minutes prior to your appointment time for registration and insurance purposes.  The test will take approximately 3 to 4 hours to complete; you may bring reading material.  If someone comes  with you to your appointment, they will need to remain in the main lobby due to limited space in the testing area. **If you  are pregnant or breastfeeding, please notify the nuclear lab prior to your appointment**  How to prepare for your Myocardial Perfusion Test:  Do not eat or drink 3 hours prior to your test, except you may have water.  Do not consume products containing caffeine (regular or decaffeinated) 12 hours prior to your test. (ex: coffee, chocolate, sodas, tea).  Do bring a list of your current medications with you.  If not listed below, you may take your medications as normal.  Do wear comfortable clothes (no dresses or overalls) and walking shoes, tennis shoes preferred (No heels or open toe shoes are allowed).  Do NOT wear cologne, perfume, aftershave, or lotions (deodorant is allowed).  If these instructions are not followed, your test will have to be rescheduled.     Follow-Up: At [facility], you and your health needs are our priority.  As part of our continuing mission to provide you with exceptional heart care, we have created designated Provider Care Teams.  These Care Teams include your primary Cardiologist (physician) and Advanced Practice Providers (APPs -  Physician Assistants and Nurse Practitioners) who all work together to provide you with the care you need, when you need it.  We recommend signing up for the patient portal called "MyChart".  Sign up information is provided on this After Visit Summary.  MyChart is used to connect with patients for Virtual Visits (Telemedicine).  Patients are able to view lab/test results, encounter notes, upcoming appointments, etc.  Non-urgent messages can be sent to your provider as well.   To learn more about what you can do with MyChart, go to [email or web address].    Your next appointment:   6 month(s)  The format for your next appointment:   In Person  Provider:   You may see Sherren Mocha, MD or one of the  following Advanced Practice Providers on your designated Care Team:    Richardson Dopp, PA-C  Robbie Lis, PA-C    Other Instructions     Signed, Luis Sweeney, [place]  08/01/2020 11:08 AM    [place]

## 2020-08-01 NOTE — Patient Instructions (Signed)
Medication Instructions:  Your physician recommends that you continue on your current medications as directed. Please refer to the Current Medication list given to you today.  *If you need a refill on your cardiac medications before your next appointment, please call your pharmacy*   Lab Work: None ordered  If you have labs (blood work) drawn today and your tests are completely normal, you will receive your results only by:  [place] (if you have MyChart) OR  A paper copy in the mail If you have any lab test that is abnormal or we need to change your treatment, we will call you to review the results.   Testing/Procedures: Your physician has requested that you have en exercise stress myoview. For further information please visit [email or web address]. Please follow instruction sheet, BELOW:    You are scheduled for a Exercise Stress Myocardial Perfusion Imaging Study.  Please arrive 15 minutes prior to your appointment time for registration and insurance purposes.  The test will take approximately 3 to 4 hours to complete; you may bring reading material.  If someone comes with you to your appointment, they will need to remain in the main lobby due to limited space in the testing area. **If you are pregnant or breastfeeding, please notify the nuclear lab prior to your appointment**  How to prepare for your Myocardial Perfusion Test:  Do not eat or drink 3 hours prior to your test, except you may have water.  Do not consume products containing caffeine (regular or decaffeinated) 12 hours prior to your test. (ex: coffee, chocolate, sodas, tea).  Do bring a list of your current medications with you.  If not listed below, you may take your medications as normal.  Do wear comfortable clothes (no dresses or overalls) and walking shoes, tennis shoes preferred (No heels or open toe shoes are allowed).  Do NOT wear cologne, perfume, aftershave, or lotions (deodorant is allowed).  If  these instructions are not followed, your test will have to be rescheduled.     Follow-Up: At [facility], you and your health needs are our priority.  As part of our continuing mission to provide you with exceptional heart care, we have created designated Provider Care Teams.  These Care Teams include your primary Cardiologist (physician) and Advanced Practice Providers (APPs -  Physician Assistants and Nurse Practitioners) who all work together to provide you with the care you need, when you need it.  We recommend signing up for the patient portal called "MyChart".  Sign up information is provided on this After Visit Summary.  MyChart is used to connect with patients for Virtual Visits (Telemedicine).  Patients are able to view lab/test results, encounter notes, upcoming appointments, etc.  Non-urgent messages can be sent to your provider as well.   To learn more about what you can do with MyChart, go to [email or web address].    Your next appointment:   6 month(s)  The format for your next appointment:   In Person  Provider:   You may see Sherren Mocha, MD or one of the following Advanced Practice Providers on your designated Care Team:    Richardson Dopp, PA-C  Robbie Lis, [place]    Other Instructions

## 2020-08-01 NOTE — Progress Notes (Signed)
Yes - ok to hold aspirin for surgery. thanks

## 2020-08-19 DIAGNOSIS — R059 Cough, unspecified: Secondary | ICD-10-CM | POA: Diagnosis not present

## 2020-08-19 DIAGNOSIS — Z20822 Contact with and (suspected) exposure to covid-19: Secondary | ICD-10-CM | POA: Diagnosis not present

## 2020-08-19 DIAGNOSIS — B349 Viral infection, unspecified: Secondary | ICD-10-CM | POA: Diagnosis not present

## 2020-08-20 ENCOUNTER — Telehealth (HOSPITAL_COMMUNITY): Payer: Self-pay | Admitting: *Deleted

## 2020-08-22 ENCOUNTER — Other Ambulatory Visit: Payer: PPO

## 2020-08-22 ENCOUNTER — Other Ambulatory Visit (HOSPITAL_COMMUNITY): Payer: PPO

## 2020-08-26 ENCOUNTER — Encounter (HOSPITAL_COMMUNITY): Payer: PPO

## 2020-10-12 DIAGNOSIS — U071 COVID-19: Secondary | ICD-10-CM

## 2020-10-12 HISTORY — DX: COVID-19: U07.1

## 2020-10-22 ENCOUNTER — Encounter: Payer: Self-pay | Admitting: Family Medicine

## 2020-10-22 ENCOUNTER — Telehealth: Payer: Self-pay | Admitting: Family Medicine

## 2020-10-22 NOTE — Telephone Encounter (Signed)
Patient called in stating that he tested positive for covid once last week and again Sunday. PT is wondering if he can receive COVID infusion? Stated he has cough, chills, congestion. Please advise.

## 2020-10-22 NOTE — Telephone Encounter (Signed)
plz triage  What was first day of symptoms? If >7 days, he may not qualify for mAb infusion and in general may not qualify due to current shortage.  He received 1st 2 shots of Moderna. Did he receive booster?  If dyspnea, recommend in -person evaluation.  Would offer virtual visit as well.

## 2020-10-23 NOTE — Telephone Encounter (Signed)
Called and spoke with patient regarding follow-up. Patient stated that his symptoms have been going on since last Tuesday. He is having Cough, Chills, Congestion, Intermittent Fevers. Patient denies SOB. Patient stated that he is feeling better than last week, but he is not 100%. Patient has been taking Tylenol at night to help with the fevers, but denies taking any other medications besides his normal meds. Informed patient of Dr. Bosie Clos remarks regarding the infusion. Patient verbalized understanding. Also, patient stated that he has not had the booster. Denied wanting a virtual visit. UC and ED precautions given. Informed patient to call back if he feels like he needs a virtual visit.

## 2020-10-31 ENCOUNTER — Other Ambulatory Visit: Payer: Self-pay | Admitting: Cardiovascular Disease

## 2020-11-05 ENCOUNTER — Ambulatory Visit: Payer: PPO | Admitting: Internal Medicine

## 2020-11-06 DIAGNOSIS — N3000 Acute cystitis without hematuria: Secondary | ICD-10-CM | POA: Diagnosis not present

## 2020-11-18 ENCOUNTER — Encounter: Payer: Self-pay | Admitting: Family Medicine

## 2020-11-18 ENCOUNTER — Ambulatory Visit (INDEPENDENT_AMBULATORY_CARE_PROVIDER_SITE_OTHER): Payer: PPO | Admitting: Family Medicine

## 2020-11-18 ENCOUNTER — Other Ambulatory Visit: Payer: Self-pay

## 2020-11-18 VITALS — BP 118/60 | HR 89 | Temp 97.5°F | Ht 73.0 in | Wt 248.0 lb

## 2020-11-18 DIAGNOSIS — Z87440 Personal history of urinary (tract) infections: Secondary | ICD-10-CM | POA: Diagnosis not present

## 2020-11-18 DIAGNOSIS — R300 Dysuria: Secondary | ICD-10-CM | POA: Diagnosis not present

## 2020-11-18 LAB — POC URINALSYSI DIPSTICK (AUTOMATED)
Bilirubin, UA: NEGATIVE
Blood, UA: NEGATIVE
Glucose, UA: NEGATIVE
Ketones, UA: NEGATIVE
Leukocytes, UA: NEGATIVE
Nitrite, UA: NEGATIVE
Protein, UA: NEGATIVE
Spec Grav, UA: >=1.03 — AB (ref 1.010–1.025)
Urobilinogen, UA: 0.2 E.U./dL
pH, UA: 5.5 (ref 5.0–8.0)

## 2020-11-18 NOTE — Assessment & Plan Note (Signed)
Will request [facility] records to review UCx.  Overall symptoms improved at this time. UA today also without signs of infection. It did return concentrated - discussed hydration status and fluid choice.

## 2020-11-18 NOTE — Progress Notes (Signed)
Patient ID: Luis Sweeney, male    DOB: 02-Aug-1941, 80 y.o.   MRN: [id number]  This visit was conducted in person.  BP 118/60 (BP Location: Left Arm, Patient Position: Sitting, Cuff Size: Large)   Pulse 89   Temp (!) 97.5 F (36.4 C) (Temporal)   Ht 6\' 1"  (1.854 m)   Wt 248 lb (112.5 kg)   SpO2 93%   BMI 32.72 kg/m    CC: dysuria f/u Subjective:   HPI: Luis Sweeney is a 80 y.o. male presenting on 11/18/2020 for Dysuria (Was having pain when urinating about 3 wks ago.  Seen at [facility] and tx for sxs with abx.  Sxs have resolved, however, pt wants f/u UA.)   COVID+ early January - fully recovered.   A few weeks ago noted dysuria, urgency, frequency, incomplete bladder emptying and R abdominal discomfort and nausea - felt like kidney stone (hx previous). Seen at [facility] dx UTI treated with amoxicillin x1 wk - then received call that culture returned negative told to stop abx.    No fevers/chills.   He's been drinking more cranberry juice.  Doesn't do well with water throughout the day. Does drink diet pepsi.      Relevant past medical, surgical, family and social history reviewed and updated as indicated. Interim medical history since our last visit reviewed. Allergies and medications reviewed and updated. Outpatient Medications Prior to Visit  Medication Sig Dispense Refill  . aspirin EC 81 MG tablet Take 81 mg by mouth daily.    . Cholecalciferol (VITAMIN D) 125 MCG (5000 UT) CAPS Take 1 capsule by mouth once a week.    . lovastatin (MEVACOR) 40 MG tablet Take 1 tablet (40 mg total) by mouth every Monday, Wednesday, and Friday. 40 tablet 3  . pantoprazole (PROTONIX) 40 MG tablet Take 1 tablet (40 mg total) by mouth daily. (Patient taking differently: Take 40 mg by mouth daily. As needed) 90 tablet 3  . ramipril (ALTACE) 5 MG capsule TAKE 1 CAPSULE BY MOUTH TWICE DAILY 180 capsule 2  . vitamin B-12 (CYANOCOBALAMIN) 1000 MCG tablet Take 1,000 mcg by mouth daily.     No  facility-administered medications prior to visit.     Per HPI unless specifically indicated in ROS section below Review of Systems Objective:  BP 118/60 (BP Location: Left Arm, Patient Position: Sitting, Cuff Size: Large)   Pulse 89   Temp (!) 97.5 F (36.4 C) (Temporal)   Ht 6\' 1"  (1.854 m)   Wt 248 lb (112.5 kg)   SpO2 93%   BMI 32.72 kg/m   Wt Readings from Last 3 Encounters:  11/18/20 248 lb (112.5 kg)  08/01/20 245 lb (111.1 kg)  02/07/20 247 lb 5 oz (112.2 kg)      Physical Exam Vitals and nursing note reviewed.  Constitutional:      Appearance: Normal appearance. He is not ill-appearing.  Abdominal:     General: Abdomen is flat. Bowel sounds are normal. There is no distension.     Palpations: Abdomen is soft. There is no mass.     Tenderness: There is no abdominal tenderness. There is no right CVA tenderness, left CVA tenderness, guarding or rebound. Negative signs include Murphy's sign.     Hernia: No hernia is present.  Musculoskeletal:     Right lower leg: No edema.     Left lower leg: No edema.  Skin:    General: Skin is warm and dry.  Findings: No rash.  Neurological:     Mental Status: He is alert.  Psychiatric:        Mood and Affect: Mood normal.        Behavior: Behavior normal.       Results for orders placed or performed in visit on 11/18/20  POCT Urinalysis Dipstick (Automated)  Result Value Ref Range   Color, UA yellow    Clarity, UA clear    Glucose, UA Negative Negative   Bilirubin, UA negative    Ketones, UA negative    Spec Grav, UA >=1.030 (A) 1.010 - 1.025   Blood, UA negative    pH, UA 5.5 5.0 - 8.0   Protein, UA Negative Negative   Urobilinogen, UA 0.2 0.2 or 1.0 E.U./dL   Nitrite, UA negative    Leukocytes, UA Negative Negative   Assessment & Plan:  This visit occurred during the SARS-CoV-2 public health emergency.  Safety protocols were in place, including screening questions prior to the visit, additional usage of staff  PPE, and extensive cleaning of exam room while observing appropriate contact time as indicated for disinfecting solutions.   Problem List Items Addressed This Visit    Dysuria - Primary    Will request fastmed records to review UCx.  Overall symptoms improved at this time. UA today also without signs of infection. It did return concentrated - discussed hydration status and fluid choice.        Other Visit Diagnoses    History of UTI       Relevant Orders   POCT Urinalysis Dipstick (Automated) (Completed)       No orders of the defined types were placed in this encounter.  Orders Placed This Encounter  Procedures  . POCT Urinalysis Dipstick (Automated)    Patient Instructions  We will request recent visit from [facility] in [place] ([facility] and [employer]).  Urinalysis today looking ok but concentrated - increase water intake.  Return after 02/06/2021 for wellness visit/physical.    Follow up plan: Return if symptoms worsen or fail to improve.  Ria Bush, MD

## 2020-11-18 NOTE — Patient Instructions (Addendum)
We will request recent visit from [facility] in [place] ([facility] and [employer]).  Urinalysis today looking ok but concentrated - increase water intake.  Return after 02/06/2021 for wellness visit/physical.

## 2020-11-21 ENCOUNTER — Emergency Department: Payer: PPO

## 2020-11-21 ENCOUNTER — Other Ambulatory Visit: Payer: Self-pay

## 2020-11-21 ENCOUNTER — Inpatient Hospital Stay
Admission: EM | Admit: 2020-11-21 | Discharge: 2020-11-23 | DRG: 389 | Disposition: A | Discharge status: Home / Self Care | Payer: PPO | Attending: Internal Medicine | Admitting: Internal Medicine

## 2020-11-21 DIAGNOSIS — R1110 Vomiting, unspecified: Secondary | ICD-10-CM | POA: Diagnosis not present

## 2020-11-21 DIAGNOSIS — E1121 Type 2 diabetes mellitus with diabetic nephropathy: Secondary | ICD-10-CM | POA: Diagnosis present

## 2020-11-21 DIAGNOSIS — Z981 Arthrodesis status: Secondary | ICD-10-CM

## 2020-11-21 DIAGNOSIS — E785 Hyperlipidemia, unspecified: Secondary | ICD-10-CM | POA: Diagnosis present

## 2020-11-21 DIAGNOSIS — Z833 Family history of diabetes mellitus: Secondary | ICD-10-CM

## 2020-11-21 DIAGNOSIS — I252 Old myocardial infarction: Secondary | ICD-10-CM

## 2020-11-21 DIAGNOSIS — Z87440 Personal history of urinary (tract) infections: Secondary | ICD-10-CM | POA: Diagnosis not present

## 2020-11-21 DIAGNOSIS — Z8249 Family history of ischemic heart disease and other diseases of the circulatory system: Secondary | ICD-10-CM | POA: Diagnosis not present

## 2020-11-21 DIAGNOSIS — Z8616 Personal history of COVID-19: Secondary | ICD-10-CM

## 2020-11-21 DIAGNOSIS — K529 Noninfective gastroenteritis and colitis, unspecified: Secondary | ICD-10-CM

## 2020-11-21 DIAGNOSIS — E875 Hyperkalemia: Secondary | ICD-10-CM | POA: Diagnosis not present

## 2020-11-21 DIAGNOSIS — R531 Weakness: Secondary | ICD-10-CM | POA: Diagnosis not present

## 2020-11-21 DIAGNOSIS — R55 Syncope and collapse: Secondary | ICD-10-CM

## 2020-11-21 DIAGNOSIS — Z87891 Personal history of nicotine dependence: Secondary | ICD-10-CM | POA: Diagnosis not present

## 2020-11-21 DIAGNOSIS — Z20822 Contact with and (suspected) exposure to covid-19: Secondary | ICD-10-CM | POA: Diagnosis present

## 2020-11-21 DIAGNOSIS — K760 Fatty (change of) liver, not elsewhere classified: Secondary | ICD-10-CM | POA: Diagnosis not present

## 2020-11-21 DIAGNOSIS — N1832 Chronic kidney disease, stage 3b: Secondary | ICD-10-CM | POA: Diagnosis present

## 2020-11-21 DIAGNOSIS — Z885 Allergy status to narcotic agent status: Secondary | ICD-10-CM

## 2020-11-21 DIAGNOSIS — I25118 Atherosclerotic heart disease of native coronary artery with other forms of angina pectoris: Secondary | ICD-10-CM | POA: Diagnosis present

## 2020-11-21 DIAGNOSIS — E1122 Type 2 diabetes mellitus with diabetic chronic kidney disease: Secondary | ICD-10-CM | POA: Diagnosis not present

## 2020-11-21 DIAGNOSIS — R42 Dizziness and giddiness: Secondary | ICD-10-CM | POA: Diagnosis not present

## 2020-11-21 DIAGNOSIS — Z7982 Long term (current) use of aspirin: Secondary | ICD-10-CM | POA: Diagnosis not present

## 2020-11-21 DIAGNOSIS — I129 Hypertensive chronic kidney disease with stage 1 through stage 4 chronic kidney disease, or unspecified chronic kidney disease: Secondary | ICD-10-CM | POA: Diagnosis not present

## 2020-11-21 DIAGNOSIS — N179 Acute kidney failure, unspecified: Secondary | ICD-10-CM | POA: Diagnosis not present

## 2020-11-21 DIAGNOSIS — R112 Nausea with vomiting, unspecified: Secondary | ICD-10-CM | POA: Diagnosis not present

## 2020-11-21 DIAGNOSIS — K56600 Partial intestinal obstruction, unspecified as to cause: Secondary | ICD-10-CM | POA: Diagnosis not present

## 2020-11-21 DIAGNOSIS — G8321 Monoplegia of upper limb affecting right dominant side: Secondary | ICD-10-CM | POA: Diagnosis not present

## 2020-11-21 DIAGNOSIS — R109 Unspecified abdominal pain: Secondary | ICD-10-CM | POA: Diagnosis not present

## 2020-11-21 DIAGNOSIS — R197 Diarrhea, unspecified: Secondary | ICD-10-CM | POA: Diagnosis not present

## 2020-11-21 DIAGNOSIS — E7800 Pure hypercholesterolemia, unspecified: Secondary | ICD-10-CM | POA: Diagnosis present

## 2020-11-21 DIAGNOSIS — Z841 Family history of disorders of kidney and ureter: Secondary | ICD-10-CM | POA: Diagnosis not present

## 2020-11-21 DIAGNOSIS — D72829 Elevated white blood cell count, unspecified: Secondary | ICD-10-CM

## 2020-11-21 DIAGNOSIS — K315 Obstruction of duodenum: Secondary | ICD-10-CM | POA: Diagnosis present

## 2020-11-21 DIAGNOSIS — Z79899 Other long term (current) drug therapy: Secondary | ICD-10-CM

## 2020-11-21 DIAGNOSIS — K56609 Unspecified intestinal obstruction, unspecified as to partial versus complete obstruction: Secondary | ICD-10-CM | POA: Diagnosis not present

## 2020-11-21 DIAGNOSIS — R110 Nausea: Secondary | ICD-10-CM | POA: Diagnosis not present

## 2020-11-21 DIAGNOSIS — I10 Essential (primary) hypertension: Secondary | ICD-10-CM | POA: Diagnosis not present

## 2020-11-21 DIAGNOSIS — I959 Hypotension, unspecified: Secondary | ICD-10-CM | POA: Diagnosis not present

## 2020-11-21 DIAGNOSIS — Z888 Allergy status to other drugs, medicaments and biological substances status: Secondary | ICD-10-CM | POA: Diagnosis not present

## 2020-11-21 DIAGNOSIS — K219 Gastro-esophageal reflux disease without esophagitis: Secondary | ICD-10-CM | POA: Diagnosis present

## 2020-11-21 DIAGNOSIS — N183 Chronic kidney disease, stage 3 unspecified: Secondary | ICD-10-CM

## 2020-11-21 DIAGNOSIS — E118 Type 2 diabetes mellitus with unspecified complications: Secondary | ICD-10-CM | POA: Diagnosis present

## 2020-11-21 DIAGNOSIS — Z8261 Family history of arthritis: Secondary | ICD-10-CM

## 2020-11-21 DIAGNOSIS — I2510 Atherosclerotic heart disease of native coronary artery without angina pectoris: Secondary | ICD-10-CM | POA: Diagnosis not present

## 2020-11-21 LAB — CBC WITH DIFFERENTIAL/PLATELET
Abs Immature Granulocytes: 0.07 10*3/uL (ref 0.00–0.07)
Basophils Absolute: 0.1 10*3/uL (ref 0.0–0.1)
Basophils Relative: 0 %
Eosinophils Absolute: 0.1 10*3/uL (ref 0.0–0.5)
Eosinophils Relative: 0 %
HCT: 44.8 % (ref 39.0–52.0)
Hemoglobin: 14.5 g/dL (ref 13.0–17.0)
Immature Granulocytes: 0 %
Lymphocytes Relative: 4 %
Lymphs Abs: 0.7 10*3/uL (ref 0.7–4.0)
MCH: 31.3 pg (ref 26.0–34.0)
MCHC: 32.4 g/dL (ref 30.0–36.0)
MCV: 96.6 fL (ref 80.0–100.0)
Monocytes Absolute: 1.4 10*3/uL — ABNORMAL HIGH (ref 0.1–1.0)
Monocytes Relative: 8 %
Neutro Abs: 14.9 10*3/uL — ABNORMAL HIGH (ref 1.7–7.7)
Neutrophils Relative %: 88 %
Platelets: 277 10*3/uL (ref 150–400)
RBC: 4.64 MIL/uL (ref 4.22–5.81)
RDW: 14.4 % (ref 11.5–15.5)
WBC: 17.2 10*3/uL — ABNORMAL HIGH (ref 4.0–10.5)
nRBC: 0 % (ref 0.0–0.2)

## 2020-11-21 LAB — URINALYSIS, COMPLETE (UACMP) WITH MICROSCOPIC
Bacteria, UA: NONE SEEN
Bilirubin Urine: NEGATIVE
Glucose, UA: NEGATIVE mg/dL
Ketones, ur: NEGATIVE mg/dL
Leukocytes,Ua: NEGATIVE
Nitrite: NEGATIVE
Protein, ur: 100 mg/dL — AB
Specific Gravity, Urine: 1.02 (ref 1.005–1.030)
pH: 5 (ref 5.0–8.0)

## 2020-11-21 LAB — COMPREHENSIVE METABOLIC PANEL
ALT: 17 U/L (ref 0–44)
AST: 27 U/L (ref 15–41)
Albumin: 4.4 g/dL (ref 3.5–5.0)
Alkaline Phosphatase: 33 U/L — ABNORMAL LOW (ref 38–126)
Anion gap: 10 (ref 5–15)
BUN: 26 mg/dL — ABNORMAL HIGH (ref 8–23)
CO2: 22 mmol/L (ref 22–32)
Calcium: 9.5 mg/dL (ref 8.9–10.3)
Chloride: 103 mmol/L (ref 98–111)
Creatinine, Ser: 2.32 mg/dL — ABNORMAL HIGH (ref 0.61–1.24)
GFR, Estimated: 28 mL/min — ABNORMAL LOW (ref >60)
Glucose, Bld: 145 mg/dL — ABNORMAL HIGH (ref 70–99)
Potassium: 5.3 mmol/L — ABNORMAL HIGH (ref 3.5–5.1)
Sodium: 135 mmol/L (ref 135–145)
Total Bilirubin: 1 mg/dL (ref 0.3–1.2)
Total Protein: 8.2 g/dL — ABNORMAL HIGH (ref 6.5–8.1)

## 2020-11-21 LAB — TROPONIN I (HIGH SENSITIVITY)
Troponin I (High Sensitivity): 3 ng/L (ref <18)
Troponin I (High Sensitivity): 4 ng/L (ref <18)

## 2020-11-21 MED ORDER — ASPIRIN EC 81 MG PO TBEC
81.0000 mg | DELAYED_RELEASE_TABLET | Freq: Every day | ORAL | Status: DC
  Administered 2020-11-22 – 2020-11-23 (×2): 81 mg via ORAL
  Filled 2020-11-21 (×2): qty 1

## 2020-11-21 MED ORDER — ONDANSETRON HCL 4 MG/2ML IJ SOLN
4.0000 mg | Freq: Once | INTRAMUSCULAR | Status: AC
  Administered 2020-11-21: 4 mg via INTRAVENOUS
  Filled 2020-11-21: qty 2

## 2020-11-21 MED ORDER — LACTATED RINGERS IV SOLN: INTRAVENOUS | Status: DC

## 2020-11-21 MED ORDER — ONDANSETRON HCL 4 MG PO TABS: 4.0000 mg | ORAL_TABLET | Freq: Four times a day (QID) | ORAL | Status: DC | PRN

## 2020-11-21 MED ORDER — SODIUM CHLORIDE 0.9 % IV SOLN
1.0000 g | Freq: Once | INTRAVENOUS | Status: AC
  Administered 2020-11-22: 1 g via INTRAVENOUS
  Filled 2020-11-21: qty 10

## 2020-11-21 MED ORDER — ACETAMINOPHEN 650 MG RE SUPP: 650.0000 mg | Freq: Four times a day (QID) | RECTAL | Status: DC | PRN

## 2020-11-21 MED ORDER — PANTOPRAZOLE SODIUM 40 MG PO TBEC
40.0000 mg | DELAYED_RELEASE_TABLET | Freq: Every day | ORAL | Status: DC
  Administered 2020-11-22 – 2020-11-23 (×2): 40 mg via ORAL
  Filled 2020-11-21 (×2): qty 1

## 2020-11-21 MED ORDER — ACETAMINOPHEN 325 MG PO TABS: 650.0000 mg | ORAL_TABLET | Freq: Four times a day (QID) | ORAL | Status: DC | PRN

## 2020-11-21 MED ORDER — ONDANSETRON HCL 4 MG/2ML IJ SOLN
4.0000 mg | Freq: Four times a day (QID) | INTRAMUSCULAR | Status: DC | PRN
  Administered 2020-11-22: 4 mg via INTRAVENOUS
  Filled 2020-11-21: qty 2

## 2020-11-21 MED ORDER — LACTATED RINGERS IV BOLUS
1000.0000 mL | Freq: Once | INTRAVENOUS | Status: AC
  Administered 2020-11-21: 1000 mL via INTRAVENOUS

## 2020-11-21 MED ORDER — SODIUM CHLORIDE 0.9% FLUSH
3.0000 mL | Freq: Two times a day (BID) | INTRAVENOUS | Status: DC
  Administered 2020-11-21 – 2020-11-23 (×4): 3 mL via INTRAVENOUS

## 2020-11-21 MED ORDER — METRONIDAZOLE IN NACL 5-0.79 MG/ML-% IV SOLN
500.0000 mg | Freq: Once | INTRAVENOUS | Status: AC
  Administered 2020-11-22: 500 mg via INTRAVENOUS
  Filled 2020-11-21: qty 100

## 2020-11-21 MED ORDER — HEPARIN SODIUM (PORCINE) 5000 UNIT/ML IJ SOLN
5000.0000 [IU] | Freq: Three times a day (TID) | INTRAMUSCULAR | Status: DC
  Administered 2020-11-21 – 2020-11-23 (×5): 5000 [IU] via SUBCUTANEOUS
  Filled 2020-11-21 (×4): qty 1

## 2020-11-21 MED ORDER — SODIUM CHLORIDE 0.9 % IV SOLN: INTRAVENOUS | Status: AC

## 2020-11-21 NOTE — H&P (Addendum)
History and Physical   Luis Sweeney WLS:[id number] DOB: [birth year] DOA: 11/21/2020  PCP: Ria Bush, MD   Patient coming from: Home  Chief Complaint: GI upset, near syncopal episode  HPI: Luis Sweeney is a 80 y.o. male with medical history significant of hypertension, hyperlipidemia, CAD, CKD 3, diabetes, GERD, duodenal stricture who presents with 2 days of GI symptoms and an episode of near syncope today.  Patient reports he began having symptoms earlier this week which became more significant 2 days ago consisting of nausea, vomiting, diarrhea along with some dizziness and weakness.  He reports that today he had an episode of weakness, dizziness, sweatiness and nearly passed out when he was standing up after having a bowel movement.  When evaluated by EMS his blood pressure was initially in the 81L systolic there were unable to get an IV for a bolus in route but his blood pressure did spontaneously improve to the 572 systolic.  Of note he was managed with Covid in the first week of January he is now outside of his isolation window.  He says his symptoms from this improved.  He also was treated for UTI in January as well. He reports some abdominal pain today.  He also reports some nasal/sinus congestion.  He denies fever, chills, chest pain, shortness of breath.  ED Course: Vital signs in the ED significant for blood pressure in the 620B to 559R systolic, heart rate in the 90s, respiratory rate in the low 20s.  Lab work-up showed BMP with potassium of 5.3, creatinine of 2.32 up from baseline of about 1.7, glucose 145.  LFTs showed total protein mildly elevated 8.2.  CBC showed leukocytosis to 17.  Initial troponin normal with repeat pending, Covid panel ordered and is pending, urinalysis ordered and is pending.  Chest x-ray was without acute abnormality and CT abdomen pelvis showed partial SBO and diverticulosis without diverticulitis. Patient given a liter bolus of LR in ED.  Review of  Systems: As per HPI otherwise all other systems reviewed and are negative.  Past Medical History:  Diagnosis Date  . Acute duodenal ulcer with hemorrhage but without obstruction    required  bld. transfusion  . Anemia, iron deficiency   . Arthritis    OA- pt. reports its. minor  . CAD (coronary artery disease) 2004   a. s/p MI treated with drug-eluting stent placement to the proximal left anterior descending in late 2004  . CKD (chronic kidney disease), stage III ([place])   . COVID-19 virus infection 10/2020  . Diverticulosis of colon   . Duodenal stricture - recurrent 09/21/2014  . 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 GERD (gastroesophageal reflux disease)   . H/O hiatal hernia   . Helicobacter pylori gastritis    recurrent  . Hypercholesteremia   . Hyperglycemia   . Hypertension   . Myocardial infarct ([place])   . NAFLD (nonalcoholic fatty liver disease) 2015   with liver cysts  . Paralysis of upper limb (HCC)    R arm- birth injury  . Personal history of colonic polyps   . Urticaria    Lat left arm    Past Surgical History:  Procedure Laterality Date  . ANTERIOR CERVICAL DECOMP/DISCECTOMY FUSION  03/2013   C3/4, C4/5 HNP (kritzer)  . ANTERIOR CERVICAL DECOMP/DISCECTOMY FUSION N/A 03/15/2013   Procedure: ANTERIOR CERVICAL DECOMPRESSION/DISCECTOMY FUSION 2 LEVELS;  Surgeon: Faythe Ghee, MD;  Location: [facility];  Service: Neurosurgery;  Laterality: N/A;  Cervical three-four, cervical four-five anterior cervical diskectomy fusion with a trabecular metal plus plate   . CARDIAC CATHETERIZATION  2006   stent placed  . CARDIAC CATHETERIZATION N/A 07/08/2016   Procedure: Left Heart Cath and Coronary Angiography;  Surgeon: Sherren Mocha, MD;  Location: [facility];  Service: Cardiovascular;  Laterality: N/A;  . COLONOSCOPY  multiple   diverticulosis, int hem, h/o adenomatous polyps [name])  . COLONOSCOPY  07/2014   severe  diverticulosis, no rpt needed [name])  . CORONARY STENT PLACEMENT  2006   drug-eluting  . ESOPHAGOGASTRODUODENOSCOPY  11/26/10   mild gastritis, duodenitis, duodenal stricture (Dr. [name])  . ESOPHAGOGASTRODUODENOSCOPY  10//2015   duodenal stricture, ?recurrent ulcer [name])  . HERNIA REPAIR Right 2006   Dr. Hassell Done  . KNEE SURGERY     left  . SHOULDER SURGERY     right  . spinal cyst removal    . WRIST SURGERY      Social History  reports that he quit smoking about 35 years ago. He has a 4.50 pack-year smoking history. He has never used smokeless tobacco. He reports current alcohol use. He reports that he does not use drugs.  Allergies  Allergen Reactions  . Prednisone Other (See Comments)    Feels really bad  . Codeine Hives and Itching  . Lipitor [Atorvastatin] Other (See Comments)    Reaction:  Muscle pain   . Morphine Hives and Itching  . Simvastatin Other (See Comments)    Reaction:  Muscle pain     Family History  Problem Relation Age of Onset  . Diabetes Father   . Heart disease Father        CHF  . Kidney disease Father        kidney failure  . Heart failure Father   . Diabetes Sister   . Hypertension Sister   . Cancer Sister        pelvic mass  . Hypertension Sister   . Arthritis Sister   . Ovarian cancer Other   Reviewed on admission  Prior to Admission medications   Medication Sig Start Date End Date Taking? Authorizing Provider  aspirin EC 81 MG tablet Take 81 mg by mouth daily.    [provider]  Cholecalciferol (VITAMIN D) 125 MCG (5000 UT) CAPS Take 1 capsule by mouth once a week. 02/07/20   Ria Bush, MD  lovastatin (MEVACOR) 40 MG tablet Take 1 tablet (40 mg total) by mouth every Monday, Wednesday, and Friday. 02/07/20   Ria Bush, MD  pantoprazole (PROTONIX) 40 MG tablet Take 1 tablet (40 mg total) by mouth daily. Patient taking differently: Take 40 mg by mouth daily. As needed 02/07/20   Ria Bush, MD   ramipril (ALTACE) 5 MG capsule TAKE 1 CAPSULE BY MOUTH TWICE DAILY 10/31/20   Sherren Mocha, MD  vitamin B-12 (CYANOCOBALAMIN) 1000 MCG tablet Take 1,000 mcg by mouth daily.    [provider]    Physical Exam: Vitals:   11/21/20 1938 11/21/20 1950 11/21/20 2034 11/21/20 2040  BP: (!) 161/57     Pulse: (!) 105 100  100  Resp: 18 17    Temp:   98.6 F (37 C)   TempSrc:   Oral   SpO2: 95% 96%  99%  Weight: 109.8 kg     Height: _0  (1.854 m)      Physical Exam Constitutional:      General: He is  not in acute distress.    Appearance: Normal appearance.  HENT:     Head: Normocephalic and atraumatic.     Mouth/Throat:     Mouth: Mucous membranes are moist.     Pharynx: Oropharynx is clear.  Eyes:     Extraocular Movements: Extraocular movements intact.     Pupils: Pupils are equal, round, and reactive to light.  Cardiovascular:     Rate and Rhythm: Normal rate and regular rhythm.     Pulses: Normal pulses.     Heart sounds: Normal heart sounds.  Pulmonary:     Effort: Pulmonary effort is normal. No respiratory distress.     Breath sounds: Normal breath sounds.  Abdominal:     General: Bowel sounds are normal. There is distension (Mild).     Tenderness: There is no abdominal tenderness.  Musculoskeletal:        General: No swelling or deformity.  Skin:    General: Skin is warm and dry.  Neurological:     General: No focal deficit present.     Mental Status: Mental status is at baseline.    Labs on Admission: I have personally reviewed following labs and imaging studies  CBC: Recent Labs  Lab 11/21/20 1949  WBC 17.2*  NEUTROABS 14.9*  HGB 14.5  HCT 44.8  MCV 96.6  PLT 962    Basic Metabolic Panel: Recent Labs  Lab 11/21/20 1949  NA 135  K 5.3*  CL 103  CO2 22  GLUCOSE 145*  BUN 26*  CREATININE 2.32*  CALCIUM 9.5    GFR: Estimated Creatinine Clearance: 33 mL/min (A) (by C-G formula based on SCr of 2.32 mg/dL (H)).  Liver Function  Tests: Recent Labs  Lab 11/21/20 1949  AST 27  ALT 17  ALKPHOS 33*  BILITOT 1.0  PROT 8.2*  ALBUMIN 4.4    Urine analysis:    Component Value Date/Time   COLORURINE LT. YELLOW 12/04/2010 1013   APPEARANCEUR CLEAR 12/04/2010 1013   LABSPEC >=1.030 12/04/2010 1013   PHURINE 5.0 12/04/2010 1013   GLUCOSEU NEGATIVE 12/04/2010 1013   HGBUR TRACE-INTACT 12/04/2010 1013   HGBUR moderate 12/18/2009 1009   BILIRUBINUR negative 11/18/2020 1012   KETONESUR NEGATIVE 12/04/2010 1013   PROTEINUR Negative 11/18/2020 1012   PROTEINUR NEGATIVE 09/27/2010 0841   UROBILINOGEN 0.2 11/18/2020 1012   UROBILINOGEN 0.2 12/04/2010 1013   NITRITE negative 11/18/2020 1012   NITRITE NEGATIVE 12/04/2010 1013   LEUKOCYTESUR Negative 11/18/2020 1012    Radiological Exams on Admission: CT Abdomen Pelvis Wo Contrast  Result Date: 11/21/2020 CLINICAL DATA:  Abdominal pain, nausea vomiting EXAM: CT ABDOMEN AND PELVIS WITHOUT CONTRAST TECHNIQUE: Multidetector CT imaging of the abdomen and pelvis was performed following the standard protocol without IV contrast. COMPARISON:  September 27, 2010 FINDINGS: Lower chest: The visualized heart size within normal limits. No pericardial fluid/thickening. No hiatal hernia. The visualized portions of the lungs are clear. Multiple low-density lesions are seen again throughout the liver parenchyma the largest measuring 3 cm within the inferior right liver lobe, likely hepatic cyst. No evidence of calcified gallstones or biliary ductal dilatation. Pancreas:  Unremarkable.  No surrounding inflammatory changes. Spleen: Normal in size. Although limited due to the lack of intravenous contrast, normal in appearance. Adrenals/Urinary Tract: Both adrenal glands appear normal. The kidneys and collecting system appear normal without evidence of urinary tract calculus or hydronephrosis. Bladder is unremarkable. Stomach/Bowel: The stomach is normal in appearance. At the level of the proximal  jejunum there  is mildly dilated fluid and air-filled loops small bowel measuring up to 3.2 cm to the level of the distal ileum where there is a focal area of narrowing within the right lower quadrant with mild adjacent fat stranding changes. The terminal ileum appears to be decompressed. Scattered colonic diverticula are noted without surrounding inflammatory changes. Vascular/Lymphatic: There are no enlarged abdominal or pelvic lymph nodes. Scattered aortic atherosclerotic calcifications are seen without aneurysmal dilatation. Reproductive: The prostate is unremarkable. Other: A right fat and colon containing inguinal hernia is noted. There is a left-sided fat containing inguinal hernia. Musculoskeletal: No acute or significant osseous findings. IMPRESSION: Findings consistent with a partial small bowel obstruction to the level of the distal ileum where there is a focal area of narrowing with question of mild enteritis. The terminal ileum is decompressed. Aortic Atherosclerosis (ICD10-I70.0). Diverticulosis without diverticulitis Electronically Signed   By: Prudencio Pair M.D.   On: 11/21/2020 21:31   DG Chest 2 View  Result Date: 11/21/2020 CLINICAL DATA:  Nausea, vomiting, diarrhea. Dizziness and weakness for the past 2 days. EXAM: CHEST - 2 VIEW COMPARISON:  Chest x-ray dated July 04, 2016. FINDINGS: The heart size and mediastinal contours are within normal limits. Both lungs are clear. No acute osseous abnormality. IMPRESSION: No active cardiopulmonary disease. Electronically Signed   By: Titus Dubin M.D.   On: 11/21/2020 20:20   EKG: Independently reviewed.  Sinus tachycardia at 100 bpm, baseline wander in multiple leads.  Assessment/Plan Principal Problem:   SBO (small bowel obstruction) (HCC) Active Problems:   Coronary atherosclerosis of native coronary artery   GERD   Duodenal stricture - recurrent   Controlled diabetes mellitus type 2 with complications (HCC)   Acute renal failure  superimposed on stage 3 chronic kidney disease (HCC)   Hyperkalemia   Leukocytosis  SBO > Partial SBO noted on CT abdomen pelvis > Does have a history of duodenal stricture > Has had ongoing nausea, vomiting, diarrhea > No NG tube at this time - Consider general surgery consult in the morning if not improving - We will keep n.p.o. for now - Consider small bowel protocol versus other abdominal imaging for imaging to evaluate for resolution, though this is sometimes contraindicated if infectious etiology. - N.p.o. except sips with meds  AKI on CKD 3 Hyperkalemia > Creatinine elevated to 2.32 from a baseline of 1.7 > Mild hyperkalemia at 5.3 in the setting of AKI and SBO > Received 1 L bolus LR in the ED - We will continue with IV fluids at 125 cc/h - Avoid nephrotoxic agents - Monitor renal function and electrolytes  Leukocytosis > WBC 17 on admission, reactive versus due to some form of enteritis versus early complication of his SBO > Has met SIRS criteria with Intermittent tachycardia and Leukocytosis - Will dose Ceftriaxone and metronidazole to cover for any intra-abdominal etiology for now, re-evaluate in the AM - Monitor fever curve and white count  CAD Hypertension > Hx of MI > Soft BPs in route likely due to vagal episode - Holding ACE inhibitor due to AKI - Holding home lovastatin as it is nonformulary and not replacing as he has had reactions to other statins - Continue aspirin  GERD - Daily Protonix  DVT prophylaxis: Heparin  Code Status:   Full  Family Communication:  Daughter Caren Griffins Updated at the bedside. Of note, she is a Marine scientist who used to work at [employer].  Disposition Plan:   Patient is from:  Home  Anticipated DC to:  Home  Anticipated DC date:  1 to 3 days  Anticipated DC barriers: None  Consults called:  None, may benefit from general surgery consult depending on his response overnight  Admission status:  Observation, MedSurg with telemetry  Severity  of Illness: The appropriate patient status for this patient is OBSERVATION. Observation status is judged to be reasonable and necessary in order to provide the required intensity of service to ensure the patient's safety. The patient's presenting symptoms, physical exam findings, and initial radiographic and laboratory data in the context of their medical condition is felt to place them at decreased risk for further clinical deterioration. Furthermore, it is anticipated that the patient will be medically stable for discharge from the hospital within 2 midnights of admission. The following factors support the patient status of observation.   " The patient's presenting symptoms include nausea, vomiting, diarrhea, dizziness, weakness. " The physical exam findings include tense abdomen. " The initial radiographic and laboratory data are concerning for partial SBO on CT abdomen pelvis, leukocytosis of 17, potassium of 5.3, creatinine 2.32.  Marcelyn Bruins MD [facility]  How to contact the [facility] Attending or Consulting provider [place] or covering provider during after hours [place], for this patient?   1. Check the care team in [facility] and look for a) attending/consulting [facility] provider listed and b) the [facility] team listed 2. Log into [email or web address] and use [facility]'s universal password to access. If you do not have the password, please contact the hospital operator. 3. Locate the [facility] provider you are looking for under [facility] and page to a number that you can be directly reached. 4. If you still have difficulty reaching the provider, please page the [facility] (Director on Call) for the Hospitalists listed on amion for assistance.  11/21/2020, 10:40 PM

## 2020-11-21 NOTE — ED Provider Notes (Signed)
[facility] Emergency Department Provider Note   ____________________________________________   Event Date/Time   First MD Initiated Contact with Patient 11/21/20 1940     (approximate)  I have reviewed the triage vital signs and the nursing notes.   HISTORY  Chief Complaint Weakness   HPI Luis Sweeney is a 80 y.o. male with past medical history of hypertension, hyperlipidemia, CAD, and CKD who presents to the ED complaining of generalized weakness.  Patient reports that 2 days ago he first developed sinus congestion and nasal drainage.  Since then, he has been feeling increasingly weak with fatigue.  Earlier today, he developed nausea with vomiting as well as diarrhea.  He had an episode just prior to arrival where he had a watery bowel movement, attempted to get up and leave the bathroom, but for very lightheaded and like he might pass out.  When EMS arrived, patient continued to feel lightheaded and noted to have systolic blood pressure in the 80s.  This improved to a systolic in the 417E without intervention during transport.  Patient denies any fevers, chest pain, shortness of breath, abdominal pain, dysuria, or hematuria.  He was treated for COVID-19 infection early in January, also diagnosed with possible UTI later in January.        Past Medical History:  Diagnosis Date  . Acute duodenal ulcer with hemorrhage but without obstruction    required  bld. transfusion  . Anemia, iron deficiency   . Arthritis    OA- pt. reports its. minor  . CAD (coronary artery disease) 2004   a. s/p MI treated with drug-eluting stent placement to the proximal left anterior descending in late 2004  . CKD (chronic kidney disease), stage III ([place])   . COVID-19 virus infection 10/2020  . Diverticulosis of colon   . Duodenal stricture - recurrent 09/21/2014  . 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 GERD (gastroesophageal reflux disease)    . H/O hiatal hernia   . Helicobacter pylori gastritis    recurrent  . Hypercholesteremia   . Hyperglycemia   . Hypertension   . Myocardial infarct ([place])   . NAFLD (nonalcoholic fatty liver disease) 2015   with liver cysts  . Paralysis of upper limb (HCC)    R arm- birth injury  . Personal history of colonic polyps   . Urticaria    Lat left arm    Patient Active Problem List   Diagnosis Date Noted  . Primary osteoarthritis of right hip 06/19/2020  . Acute left-sided low back pain with left-sided sciatica 05/17/2019  . Essential hypertension 09/27/2018  . Health maintenance examination 02/25/2016  . Controlled diabetes mellitus type 2 with complications ([place]) 05/25/4817  . Irregular heart beat 02/25/2016  . Advanced care planning/counseling discussion 02/20/2015  . Obesity, Class I, BMI 30-34.9 02/20/2015  . Duodenal stricture - recurrent 09/21/2014  . Hx of peptic ulcer 06/11/2014  . NAFLD (nonalcoholic fatty liver disease)   . Medicare annual wellness visit, subsequent 05/31/2012  . Vitamin B12 deficiency 05/31/2012  . Recurrent right inguinal hernia 05/31/2012  . Dysuria 11/26/2010  . Coronary atherosclerosis of native coronary artery 03/13/2009  . CKD stage 3 due to type 2 diabetes mellitus ([place]) 03/13/2009  . Hyperlipidemia associated with type 2 diabetes mellitus ([place]) 01/04/2008  . History of colonic polyps 11/07/2007  . GERD 03/08/2007  . MYOCARDIAL INFARCTION, HX OF 03/07/2007  . ERB'S PALSY 03/07/2007  Past Surgical History:  Procedure Laterality Date  . ANTERIOR CERVICAL DECOMP/DISCECTOMY FUSION  03/2013   C3/4, C4/5 HNP (kritzer)  . ANTERIOR CERVICAL DECOMP/DISCECTOMY FUSION N/A 03/15/2013   Procedure: ANTERIOR CERVICAL DECOMPRESSION/DISCECTOMY FUSION 2 LEVELS;  Surgeon: Faythe Ghee, MD;  Location: [facility];  Service: Neurosurgery;  Laterality: N/A;  Cervical three-four, cervical four-five anterior cervical diskectomy fusion with a trabecular metal  plus plate   . CARDIAC CATHETERIZATION  2006   stent placed  . CARDIAC CATHETERIZATION N/A 07/08/2016   Procedure: Left Heart Cath and Coronary Angiography;  Surgeon: Sherren Mocha, MD;  Location: [facility];  Service: Cardiovascular;  Laterality: N/A;  . COLONOSCOPY  multiple   diverticulosis, int hem, h/o adenomatous polyps [name])  . COLONOSCOPY  07/2014   severe diverticulosis, no rpt needed [name])  . CORONARY STENT PLACEMENT  2006   drug-eluting  . ESOPHAGOGASTRODUODENOSCOPY  11/26/10   mild gastritis, duodenitis, duodenal stricture (Dr. [name])  . ESOPHAGOGASTRODUODENOSCOPY  10//2015   duodenal stricture, ?recurrent ulcer [name])  . HERNIA REPAIR Right 2006   Dr. Hassell Done  . KNEE SURGERY     left  . SHOULDER SURGERY     right  . spinal cyst removal    . WRIST SURGERY      Prior to Admission medications   Medication Sig Start Date End Date Taking? Authorizing Provider  aspirin EC 81 MG tablet Take 81 mg by mouth daily.    [provider]  Cholecalciferol (VITAMIN D) 125 MCG (5000 UT) CAPS Take 1 capsule by mouth once a week. 02/07/20   Ria Bush, MD  lovastatin (MEVACOR) 40 MG tablet Take 1 tablet (40 mg total) by mouth every Monday, Wednesday, and Friday. 02/07/20   Ria Bush, MD  pantoprazole (PROTONIX) 40 MG tablet Take 1 tablet (40 mg total) by mouth daily. Patient taking differently: Take 40 mg by mouth daily. As needed 02/07/20   Ria Bush, MD  ramipril (ALTACE) 5 MG capsule TAKE 1 CAPSULE BY MOUTH TWICE DAILY 10/31/20   Sherren Mocha, MD  vitamin B-12 (CYANOCOBALAMIN) 1000 MCG tablet Take 1,000 mcg by mouth daily.    [provider]    Allergies Prednisone, Codeine, Lipitor [atorvastatin], Morphine, and Simvastatin  Family History  Problem Relation Age of Onset  . Diabetes Father   . Heart disease Father        CHF  . Kidney disease Father        kidney failure  . Heart failure Father   . Diabetes Sister    . Hypertension Sister   . Cancer Sister        pelvic mass  . Hypertension Sister   . Arthritis Sister   . Ovarian cancer Other     Social History Social History   Tobacco Use  . Smoking status: Former Smoker    Packs/day: 1.50    Years: 3.00    Pack years: 4.50    Quit date: 02/16/1985    Years since quitting: 35.7  . Smokeless tobacco: Never Used  Substance Use Topics  . Alcohol use: Yes    Alcohol/week: 0.0 standard drinks    Comment: two to three times a year  . Drug use: No    Review of Systems  Constitutional: No fever/chills.  Positive for generalized weakness and fatigue. Eyes: No visual changes. ENT: No sore throat. Cardiovascular: Denies chest pain.  Positive for lightheadedness and near syncope. Respiratory: Denies shortness of breath. Gastrointestinal: No abdominal pain.  Positive  for nausea, vomiting, and diarrhea.  No constipation. Genitourinary: Negative for dysuria. Musculoskeletal: Negative for back pain. Skin: Negative for rash. Neurological: Negative for headaches, focal weakness or numbness.  ____________________________________________   PHYSICAL EXAM:  VITAL SIGNS: ED Triage Vitals  Enc Vitals Group     BP      Pulse      Resp      Temp      Temp src      SpO2      Weight      Height      Head Circumference      Peak Flow      Pain Score      Pain Loc      Pain Edu?      Excl. in [place]?     Constitutional: Alert and oriented. Eyes: Conjunctivae are normal. Head: Atraumatic. Nose: No congestion/rhinnorhea. Mouth/Throat: Mucous membranes are moist. Neck: Normal ROM Cardiovascular: Tachycardic, regular rhythm. Grossly normal heart sounds.  2+ radial pulses bilaterally. Respiratory: Normal respiratory effort.  No retractions. Lungs CTAB. Gastrointestinal: Soft and nontender. No distention. Genitourinary: deferred Musculoskeletal: No lower extremity tenderness nor edema. Neurologic:  Normal speech and language. No gross focal  neurologic deficits are appreciated. Skin:  Skin is warm, dry and intact. No rash noted. Psychiatric: Mood and affect are normal. Speech and behavior are normal.  ____________________________________________   LABS (all labs ordered are listed, but only abnormal results are displayed)  Labs Reviewed  CBC WITH DIFFERENTIAL/PLATELET - Abnormal; Notable for the following components:      Result Value   WBC 17.2 (*)    Neutro Abs 14.9 (*)    Monocytes Absolute 1.4 (*)    All other components within normal limits  COMPREHENSIVE METABOLIC PANEL - Abnormal; Notable for the following components:   Potassium 5.3 (*)    Glucose, Bld 145 (*)    BUN 26 (*)    Creatinine, Ser 2.32 (*)    Total Protein 8.2 (*)    Alkaline Phosphatase 33 (*)    GFR, Estimated 28 (*)    All other components within normal limits  SARS CORONAVIRUS 2 (TAT 6-24 HRS)  URINALYSIS, COMPLETE (UACMP) WITH MICROSCOPIC  TROPONIN I (HIGH SENSITIVITY)  TROPONIN I (HIGH SENSITIVITY)   ____________________________________________  EKG  ED ECG REPORT I, Blake Divine, the attending physician, personally viewed and interpreted this ECG.   Date: 11/21/2020  EKG Time: 19:39  Rate: 100  Rhythm: normal sinus rhythm  Axis: Normal  Intervals:none  ST&T Change: None   PROCEDURES  Procedure(s) performed (including Critical Care):  Procedures   ____________________________________________   INITIAL IMPRESSION / ASSESSMENT AND PLAN / ED COURSE       80 year old male with past medical history of hypertension, hyperlipidemia, CAD, and CKD who presents to the ED with sinus congestion, generalized weakness, fatigue, vomiting, and diarrhea ongoing for the past 2 days.  He had an episode of lightheadedness with near syncope just prior to arrival, noted to be hypotensive initially with EMS but this improved without intervention.  Symptoms sound consistent with potential viral syndrome, although COVID-19 unlikely given he  was diagnosed with this in January.  Given his vomiting and diarrhea, we will hydrate with IV fluids.  Plan to check chest x-ray, UA, labs for any electrolyte abnormality or AKI.  Patient denies any chest pain and EKG shows no evidence of arrhythmia or ischemia, troponin pending.  Troponin within normal limits, I suspect his near syncopal episode was vasovagal.  Patient noted to have a leukocytosis as well as mild AKI, currently being hydrated with IV fluids.  He is now endorsing some abdominal pain and distention, which was further assessed with CT scan.  CT shows partial small bowel obstruction associated with an enteritis.  Patient continues to feel weak and nauseous, case discussed with hospitalist for admission.      ____________________________________________   FINAL CLINICAL IMPRESSION(S) / ED DIAGNOSES  Final diagnoses:  Partial small bowel obstruction ([place])  Near syncope  Enteritis     ED Discharge Orders    None       Note:  This document was prepared using Dragon voice recognition software and may include unintentional dictation errors.   Blake Divine, MD 11/21/20 2216

## 2020-11-21 NOTE — ED Triage Notes (Signed)
Pt presents via EMS from home for N/VD, dizziness, and weakness x 2 days. Pt reports having Covid the first week of January, then felt better. This week has started feeling generally weak with multiple episodes of diarrhea per day. Pt reports tonight while having a BM, he got very weak, dizzy, and sweaty. Per EMS, initial SBP in the 80s.  EMS attempted IV and fluid bolus, without success. SBP improved to 120s. Pt also notes recent kidney infection.

## 2020-11-21 NOTE — ED Notes (Signed)
Ice pack applied to groin per Dr. Ellender Hose who is at bedside at this time.

## 2020-11-22 ENCOUNTER — Encounter: Payer: Self-pay | Admitting: Internal Medicine

## 2020-11-22 DIAGNOSIS — G8321 Monoplegia of upper limb affecting right dominant side: Secondary | ICD-10-CM | POA: Diagnosis present

## 2020-11-22 DIAGNOSIS — Z8616 Personal history of COVID-19: Secondary | ICD-10-CM | POA: Diagnosis not present

## 2020-11-22 DIAGNOSIS — Z885 Allergy status to narcotic agent status: Secondary | ICD-10-CM | POA: Diagnosis not present

## 2020-11-22 DIAGNOSIS — E875 Hyperkalemia: Secondary | ICD-10-CM | POA: Diagnosis present

## 2020-11-22 DIAGNOSIS — I129 Hypertensive chronic kidney disease with stage 1 through stage 4 chronic kidney disease, or unspecified chronic kidney disease: Secondary | ICD-10-CM | POA: Diagnosis present

## 2020-11-22 DIAGNOSIS — Z87891 Personal history of nicotine dependence: Secondary | ICD-10-CM | POA: Diagnosis not present

## 2020-11-22 DIAGNOSIS — N179 Acute kidney failure, unspecified: Secondary | ICD-10-CM | POA: Diagnosis present

## 2020-11-22 DIAGNOSIS — E7800 Pure hypercholesterolemia, unspecified: Secondary | ICD-10-CM | POA: Diagnosis present

## 2020-11-22 DIAGNOSIS — Z7982 Long term (current) use of aspirin: Secondary | ICD-10-CM | POA: Diagnosis not present

## 2020-11-22 DIAGNOSIS — I252 Old myocardial infarction: Secondary | ICD-10-CM | POA: Diagnosis not present

## 2020-11-22 DIAGNOSIS — K56600 Partial intestinal obstruction, unspecified as to cause: Secondary | ICD-10-CM | POA: Diagnosis present

## 2020-11-22 DIAGNOSIS — Z833 Family history of diabetes mellitus: Secondary | ICD-10-CM | POA: Diagnosis not present

## 2020-11-22 DIAGNOSIS — Z20822 Contact with and (suspected) exposure to covid-19: Secondary | ICD-10-CM | POA: Diagnosis present

## 2020-11-22 DIAGNOSIS — K56609 Unspecified intestinal obstruction, unspecified as to partial versus complete obstruction: Secondary | ICD-10-CM | POA: Diagnosis not present

## 2020-11-22 DIAGNOSIS — K760 Fatty (change of) liver, not elsewhere classified: Secondary | ICD-10-CM | POA: Diagnosis present

## 2020-11-22 DIAGNOSIS — I2510 Atherosclerotic heart disease of native coronary artery without angina pectoris: Secondary | ICD-10-CM | POA: Diagnosis present

## 2020-11-22 DIAGNOSIS — R109 Unspecified abdominal pain: Secondary | ICD-10-CM | POA: Diagnosis not present

## 2020-11-22 DIAGNOSIS — E785 Hyperlipidemia, unspecified: Secondary | ICD-10-CM | POA: Diagnosis present

## 2020-11-22 DIAGNOSIS — K219 Gastro-esophageal reflux disease without esophagitis: Secondary | ICD-10-CM | POA: Diagnosis present

## 2020-11-22 DIAGNOSIS — Z841 Family history of disorders of kidney and ureter: Secondary | ICD-10-CM | POA: Diagnosis not present

## 2020-11-22 DIAGNOSIS — Z888 Allergy status to other drugs, medicaments and biological substances status: Secondary | ICD-10-CM | POA: Diagnosis not present

## 2020-11-22 DIAGNOSIS — Z8249 Family history of ischemic heart disease and other diseases of the circulatory system: Secondary | ICD-10-CM | POA: Diagnosis not present

## 2020-11-22 DIAGNOSIS — Z981 Arthrodesis status: Secondary | ICD-10-CM | POA: Diagnosis not present

## 2020-11-22 DIAGNOSIS — K529 Noninfective gastroenteritis and colitis, unspecified: Secondary | ICD-10-CM | POA: Diagnosis present

## 2020-11-22 DIAGNOSIS — E1122 Type 2 diabetes mellitus with diabetic chronic kidney disease: Secondary | ICD-10-CM | POA: Diagnosis present

## 2020-11-22 DIAGNOSIS — N1832 Chronic kidney disease, stage 3b: Secondary | ICD-10-CM | POA: Diagnosis present

## 2020-11-22 DIAGNOSIS — Z87440 Personal history of urinary (tract) infections: Secondary | ICD-10-CM | POA: Diagnosis not present

## 2020-11-22 LAB — COMPREHENSIVE METABOLIC PANEL
ALT: 13 U/L (ref 0–44)
AST: 17 U/L (ref 15–41)
Albumin: 3.1 g/dL — ABNORMAL LOW (ref 3.5–5.0)
Alkaline Phosphatase: 25 U/L — ABNORMAL LOW (ref 38–126)
Anion gap: 7 (ref 5–15)
BUN: 28 mg/dL — ABNORMAL HIGH (ref 8–23)
CO2: 22 mmol/L (ref 22–32)
Calcium: 8.3 mg/dL — ABNORMAL LOW (ref 8.9–10.3)
Chloride: 107 mmol/L (ref 98–111)
Creatinine, Ser: 2.06 mg/dL — ABNORMAL HIGH (ref 0.61–1.24)
GFR, Estimated: 32 mL/min — ABNORMAL LOW (ref >60)
Glucose, Bld: 119 mg/dL — ABNORMAL HIGH (ref 70–99)
Potassium: 4.6 mmol/L (ref 3.5–5.1)
Sodium: 136 mmol/L (ref 135–145)
Total Bilirubin: 0.6 mg/dL (ref 0.3–1.2)
Total Protein: 5.9 g/dL — ABNORMAL LOW (ref 6.5–8.1)

## 2020-11-22 LAB — CBC
HCT: 35.3 % — ABNORMAL LOW (ref 39.0–52.0)
Hemoglobin: 11.4 g/dL — ABNORMAL LOW (ref 13.0–17.0)
MCH: 31.1 pg (ref 26.0–34.0)
MCHC: 32.3 g/dL (ref 30.0–36.0)
MCV: 96.4 fL (ref 80.0–100.0)
Platelets: 238 10*3/uL (ref 150–400)
RBC: 3.66 MIL/uL — ABNORMAL LOW (ref 4.22–5.81)
RDW: 14.2 % (ref 11.5–15.5)
WBC: 8.4 10*3/uL (ref 4.0–10.5)
nRBC: 0 % (ref 0.0–0.2)

## 2020-11-22 LAB — SARS CORONAVIRUS 2 (TAT 6-24 HRS): SARS Coronavirus 2: NEGATIVE

## 2020-11-22 LAB — GLUCOSE, CAPILLARY: Glucose-Capillary: 85 mg/dL (ref 70–99)

## 2020-11-22 MED ORDER — METRONIDAZOLE 500 MG PO TABS
500.0000 mg | ORAL_TABLET | Freq: Three times a day (TID) | ORAL | Status: DC
  Filled 2020-11-22 (×2): qty 1

## 2020-11-22 MED ORDER — SODIUM CHLORIDE 0.9 % IV SOLN
2.0000 g | INTRAVENOUS | Status: DC
  Administered 2020-11-23: 2 g via INTRAVENOUS
  Filled 2020-11-22: qty 2
  Filled 2020-11-22: qty 20

## 2020-11-22 MED ORDER — SODIUM CHLORIDE 0.9 % IV SOLN
1.0000 g | Freq: Once | INTRAVENOUS | Status: AC
  Administered 2020-11-22: 1 g via INTRAVENOUS
  Filled 2020-11-22: qty 1

## 2020-11-22 MED ORDER — OXYMETAZOLINE HCL 0.05 % NA SOLN
1.0000 | Freq: Two times a day (BID) | NASAL | Status: DC
  Administered 2020-11-22 – 2020-11-23 (×3): 1 via NASAL
  Filled 2020-11-22: qty 15

## 2020-11-22 MED ORDER — METRONIDAZOLE 500 MG PO TABS
500.0000 mg | ORAL_TABLET | Freq: Three times a day (TID) | ORAL | Status: DC
  Administered 2020-11-22 (×2): 500 mg via ORAL
  Filled 2020-11-22 (×4): qty 1

## 2020-11-22 MED ORDER — CIPROFLOXACIN IN D5W 400 MG/200ML IV SOLN
400.0000 mg | Freq: Two times a day (BID) | INTRAVENOUS | Status: DC
  Filled 2020-11-22 (×2): qty 200

## 2020-11-22 MED ORDER — BENZONATATE 100 MG PO CAPS
200.0000 mg | ORAL_CAPSULE | Freq: Three times a day (TID) | ORAL | Status: DC
  Administered 2020-11-22 – 2020-11-23 (×5): 200 mg via ORAL
  Filled 2020-11-22 (×5): qty 2

## 2020-11-22 MED ORDER — ALBUTEROL SULFATE HFA 108 (90 BASE) MCG/ACT IN AERS
1.0000 | INHALATION_SPRAY | Freq: Four times a day (QID) | RESPIRATORY_TRACT | Status: DC | PRN
  Filled 2020-11-22 (×2): qty 6.7

## 2020-11-22 NOTE — Progress Notes (Signed)
PROGRESS NOTE    Luis Sweeney  TOI:[id number] DOB: 10-Sep-1941 DOA: 11/21/2020 PCP: Ria Bush, MD    Brief Narrative:  80 y.o. male with medical history significant of hypertension, hyperlipidemia, CAD, CKD 3, diabetes, GERD, duodenal stricture who presents with 2 days of GI symptoms and an episode of near syncope today.             Patient reports he began having symptoms earlier this week which became more significant 2 days ago consisting of nausea, vomiting, diarrhea along with some dizziness and weakness.  He reports that today he had an episode of weakness, dizziness, sweatiness and nearly passed out when he was standing up after having a bowel movement.  When evaluated by EMS his blood pressure was initially in the 83J systolic there were unable to get an IV for a bolus in route but his blood pressure did spontaneously improve to the 825 systolic.  Patient abdominal symptoms starting to improve.  He is passing gas and having bowel sounds.  Belly remained soft.  Kidney function starting to improve.   Assessment & Plan:   Principal Problem:   SBO (small bowel obstruction) (HCC) Active Problems:   Coronary atherosclerosis of native coronary artery   GERD   Duodenal stricture - recurrent   Controlled diabetes mellitus type 2 with complications (HCC)   Acute renal failure superimposed on stage 3 chronic kidney disease (HCC)   Hyperkalemia   Leukocytosis  Partial small bowel obstruction Patient with history of duodenal stricture Partial SBO noted on CT abdomen pelvis No nausea or vomiting reported Some persistent diarrhea Belly remained soft on exam, nondistended Plan: We will empirically treat with Rocephin and Flagyl for possible infectious bowel obstruction Advance diet to clear liquids Serial abdominal exams Repeat KUB in a.m. If repeat KUB demonstrates persistent SBO we will proceed with Gastrografin SBO protocol and surgical consultation  AKI on CKD stage  IIIb Hyperkalemia, resolved Creatinine on admission 2.3, baseline 1.7 Started on intravenous fluids Kidney function improving Plan: Continue IVF, normal saline 100 cc/h Avoid nephrotoxins Daily renal function  Leukocytosis , Resolved.  White blood cell 17 on admission Reactive versus possibly infectious Patient did receive Rocephin and metronidazole Monitor fever curve and white blood cell count  Coronary disease Hypertension Possible vagal episode and transient hypotension Currently holding ACE inhibitor due to acute kidney injury Holding home lovastatin Continue home aspirin  GERD PPI    DVT prophylaxis: SQ heparin Code Status: Full Family Communication: None today.  Offered to call family member but patient declined.  Stated he will speak with them.  I encouraged him to let the nurse know if family has any questions. Disposition Plan: Status is: Observation  The patient will require care spanning > 2 midnights and should be moved to inpatient because: Inpatient level of care appropriate due to severity of illness  Dispo: The patient is from: Home              Anticipated d/c is to: Home              Anticipated d/c date is: 2 days              Patient currently is not medically stable to d/c.   Difficult to place patient No  Partial small bowel obstruction.  Attempting conservative management with0 clear liquid diet, and intravenous fluids, empiric antibiotics.  If SBO resolves possible discharge within 24 hours.  If persistent will need Gastrografin challenge and surgical consultation.  Level of care: Med-Surg  Consultants:   None  Procedures:   None  Antimicrobials:   Rocephin  Flagyl    Subjective: Patient seen and examined.  Endorsing improved belly pain.  Continues to endorse cough.  Objective: Vitals:   11/21/20 2200 11/21/20 2335 11/22/20 0315 11/22/20 0421  BP: 139/72 (!) 119/47  134/66  Pulse: 90 91  87  Resp:  18  16   Temp:  98.5 F (36.9 C)  98.4 F (36.9 C)  TempSrc:  Oral  Oral  SpO2: 94% 97%  96%  Weight:   109 kg   Height:        Intake/Output Summary (Last 24 hours) at 11/22/2020 1024 Last data filed at 11/22/2020 1010 Gross per 24 hour  Intake 2302.31 ml  Output -  Net 2302.31 ml   Filed Weights   11/21/20 1938 11/22/20 0315  Weight: 109.8 kg 109 kg    Examination:  General exam: Appears calm and comfortable  Respiratory system: Scattered crackles bilaterally.  Normal work of breathing.  Room air Cardiovascular system: S1-S2.  Regular rate and rhythm.  No murmurs  gastrointestinal system: Obese, nontender, nondistended, normal bowel sounds Central nervous system: Alert and oriented. No focal neurological deficits. Extremities: Symmetric 5 x 5 power. Skin: No rashes, lesions or ulcers Psychiatry: Judgement and insight appear normal. Mood & affect appropriate.     Data Reviewed: I have personally reviewed following labs and imaging studies  CBC: Recent Labs  Lab 11/21/20 1949 11/22/20 0505  WBC 17.2* 8.4  NEUTROABS 14.9*  --   HGB 14.5 11.4*  HCT 44.8 35.3*  MCV 96.6 96.4  PLT 277 443   Basic Metabolic Panel: Recent Labs  Lab 11/21/20 1949 11/22/20 0505  NA 135 136  K 5.3* 4.6  CL 103 107  CO2 22 22  GLUCOSE 145* 119*  BUN 26* 28*  CREATININE 2.32* 2.06*  CALCIUM 9.5 8.3*   GFR: Estimated Creatinine Clearance: 37 mL/min (A) (by C-G formula based on SCr of 2.06 mg/dL (H)). Liver Function Tests: Recent Labs  Lab 11/21/20 1949 11/22/20 0505  AST 27 17  ALT 17 13  ALKPHOS 33* 25*  BILITOT 1.0 0.6  PROT 8.2* 5.9*  ALBUMIN 4.4 3.1*   No results for input(s): LIPASE, AMYLASE in the last 168 hours. No results for input(s): AMMONIA in the last 168 hours. Coagulation Profile: No results for input(s): INR, PROTIME in the last 168 hours. Cardiac Enzymes: No results for input(s): CKTOTAL, CKMB, CKMBINDEX, TROPONINI in the last 168 hours. BNP (last 3  results) No results for input(s): PROBNP in the last 8760 hours. HbA1C: No results for input(s): HGBA1C in the last 72 hours. CBG: No results for input(s): GLUCAP in the last 168 hours. Lipid Profile: No results for input(s): CHOL, HDL, LDLCALC, TRIG, CHOLHDL, LDLDIRECT in the last 72 hours. Thyroid Function Tests: No results for input(s): TSH, T4TOTAL, FREET4, T3FREE, THYROIDAB in the last 72 hours. Anemia Panel: No results for input(s): VITAMINB12, FOLATE, FERRITIN, TIBC, IRON, RETICCTPCT in the last 72 hours. Sepsis Labs: No results for input(s): PROCALCITON, LATICACIDVEN in the last 168 hours.  Recent Results (from the past 240 hour(s))  SARS CORONAVIRUS 2 (TAT 6-24 HRS) Nasopharyngeal Nasopharyngeal Swab     Status: None   Collection Time: 11/21/20 10:18 PM   Specimen: Nasopharyngeal Swab  Result Value Ref Range Status   SARS Coronavirus 2 NEGATIVE NEGATIVE Final    Comment: (NOTE) SARS-CoV-2 target nucleic acids are NOT DETECTED.  The  SARS-CoV-2 RNA is generally detectable in upper and lower respiratory specimens during the acute phase of infection. Negative results do not preclude SARS-CoV-2 infection, do not rule out co-infections with other pathogens, and should not be used as the sole basis for treatment or other patient management decisions. Negative results must be combined with clinical observations, patient history, and epidemiological information. The expected result is Negative.  Fact Sheet for Patients: SugarRoll.be  Fact Sheet for Healthcare Providers: https://www.woods-mathews.com/  This test is not yet approved or cleared by the [place] FDA and  has been authorized for detection and/or diagnosis of SARS-CoV-2 by FDA under an Emergency Use Authorization (EUA). This EUA will remain  in effect (meaning this test can be used) for the duration of the COVID-19 declaration under Se ction 564(b)(1) of the Act, 21  U.S.C. section 360bbb-3(b)(1), unless the authorization is terminated or revoked sooner.  Performed at [facility], [place] [address], [place], [place] [address]          Radiology Studies: CT Abdomen Pelvis Wo Contrast  Result Date: 11/21/2020 CLINICAL DATA:  Abdominal pain, nausea vomiting EXAM: CT ABDOMEN AND PELVIS WITHOUT CONTRAST TECHNIQUE: Multidetector CT imaging of the abdomen and pelvis was performed following the standard protocol without IV contrast. COMPARISON:  September 27, 2010 FINDINGS: Lower chest: The visualized heart size within normal limits. No pericardial fluid/thickening. No hiatal hernia. The visualized portions of the lungs are clear. Multiple low-density lesions are seen again throughout the liver parenchyma the largest measuring 3 cm within the inferior right liver lobe, likely hepatic cyst. No evidence of calcified gallstones or biliary ductal dilatation. Pancreas:  Unremarkable.  No surrounding inflammatory changes. Spleen: Normal in size. Although limited due to the lack of intravenous contrast, normal in appearance. Adrenals/Urinary Tract: Both adrenal glands appear normal. The kidneys and collecting system appear normal without evidence of urinary tract calculus or hydronephrosis. Bladder is unremarkable. Stomach/Bowel: The stomach is normal in appearance. At the level of the proximal jejunum there is mildly dilated fluid and air-filled loops small bowel measuring up to 3.2 cm to the level of the distal ileum where there is a focal area of narrowing within the right lower quadrant with mild adjacent fat stranding changes. The terminal ileum appears to be decompressed. Scattered colonic diverticula are noted without surrounding inflammatory changes. Vascular/Lymphatic: There are no enlarged abdominal or pelvic lymph nodes. Scattered aortic atherosclerotic calcifications are seen without aneurysmal dilatation. Reproductive: The prostate is unremarkable. Other: A  right fat and colon containing inguinal hernia is noted. There is a left-sided fat containing inguinal hernia. Musculoskeletal: No acute or significant osseous findings. IMPRESSION: Findings consistent with a partial small bowel obstruction to the level of the distal ileum where there is a focal area of narrowing with question of mild enteritis. The terminal ileum is decompressed. Aortic Atherosclerosis (ICD10-I70.0). Diverticulosis without diverticulitis Electronically Signed   By: Prudencio Pair M.D.   On: 11/21/2020 21:31   DG Chest 2 View  Result Date: 11/21/2020 CLINICAL DATA:  Nausea, vomiting, diarrhea. Dizziness and weakness for the past 2 days. EXAM: CHEST - 2 VIEW COMPARISON:  Chest x-ray dated July 04, 2016. FINDINGS: The heart size and mediastinal contours are within normal limits. Both lungs are clear. No acute osseous abnormality. IMPRESSION: No active cardiopulmonary disease. Electronically Signed   By: Titus Dubin M.D.   On: 11/21/2020 20:20        Scheduled Meds: . aspirin EC  81 mg Oral Daily  . benzonatate  200 mg Oral TID  . heparin  5,000 Units Subcutaneous Q8H  . metroNIDAZOLE  500 mg Oral Q8H  . oxymetazoline  1 spray Each Nare BID  . pantoprazole  40 mg Oral Daily  . sodium chloride flush  3 mL Intravenous Q12H   Continuous Infusions: . sodium chloride 100 mL/hr at 11/22/20 0831  . cefTRIAXone (ROCEPHIN)  IV    . [START ON 11/23/2020] cefTRIAXone (ROCEPHIN)  IV       LOS: 0 days    Time spent: 35 minutes    Sidney Ace, MD [facility] Pager [phone]  If 7PM-7AM, please contact night-coverage 11/22/2020, 10:24 AM

## 2020-11-22 NOTE — Plan of Care (Signed)
Continuing with plan of care. 

## 2020-11-23 ENCOUNTER — Inpatient Hospital Stay: Payer: PPO

## 2020-11-23 LAB — CBC WITH DIFFERENTIAL/PLATELET
Abs Immature Granulocytes: 0.02 10*3/uL (ref 0.00–0.07)
Basophils Absolute: 0 10*3/uL (ref 0.0–0.1)
Basophils Relative: 1 %
Eosinophils Absolute: 0.2 10*3/uL (ref 0.0–0.5)
Eosinophils Relative: 3 %
HCT: 36.7 % — ABNORMAL LOW (ref 39.0–52.0)
Hemoglobin: 12.1 g/dL — ABNORMAL LOW (ref 13.0–17.0)
Immature Granulocytes: 0 %
Lymphocytes Relative: 18 %
Lymphs Abs: 1.1 10*3/uL (ref 0.7–4.0)
MCH: 31.8 pg (ref 26.0–34.0)
MCHC: 33 g/dL (ref 30.0–36.0)
MCV: 96.6 fL (ref 80.0–100.0)
Monocytes Absolute: 0.7 10*3/uL (ref 0.1–1.0)
Monocytes Relative: 10 %
Neutro Abs: 4.4 10*3/uL (ref 1.7–7.7)
Neutrophils Relative %: 68 %
Platelets: 229 10*3/uL (ref 150–400)
RBC: 3.8 MIL/uL — ABNORMAL LOW (ref 4.22–5.81)
RDW: 14.2 % (ref 11.5–15.5)
WBC: 6.3 10*3/uL (ref 4.0–10.5)
nRBC: 0 % (ref 0.0–0.2)

## 2020-11-23 LAB — BASIC METABOLIC PANEL
Anion gap: 8 (ref 5–15)
BUN: 20 mg/dL (ref 8–23)
CO2: 26 mmol/L (ref 22–32)
Calcium: 8.5 mg/dL — ABNORMAL LOW (ref 8.9–10.3)
Chloride: 104 mmol/L (ref 98–111)
Creatinine, Ser: 1.82 mg/dL — ABNORMAL HIGH (ref 0.61–1.24)
GFR, Estimated: 37 mL/min — ABNORMAL LOW (ref >60)
Glucose, Bld: 102 mg/dL — ABNORMAL HIGH (ref 70–99)
Potassium: 4.6 mmol/L (ref 3.5–5.1)
Sodium: 138 mmol/L (ref 135–145)

## 2020-11-23 LAB — MAGNESIUM: Magnesium: 1.8 mg/dL (ref 1.7–2.4)

## 2020-11-23 MED ORDER — BENZONATATE 200 MG PO CAPS: 200.0000 mg | ORAL_CAPSULE | Freq: Three times a day (TID) | ORAL | 0 refills | Status: DC | PRN

## 2020-11-23 MED ORDER — AMOXICILLIN-POT CLAVULANATE 875-125 MG PO TABS: 1.0000 | ORAL_TABLET | Freq: Two times a day (BID) | ORAL | 0 refills | Status: DC

## 2020-11-23 MED ORDER — AMOXICILLIN-POT CLAVULANATE 875-125 MG PO TABS: 1.0000 | ORAL_TABLET | Freq: Two times a day (BID) | ORAL | 0 refills | Status: AC

## 2020-11-23 MED ORDER — METRONIDAZOLE IN NACL 5-0.79 MG/ML-% IV SOLN
500.0000 mg | Freq: Three times a day (TID) | INTRAVENOUS | Status: DC
  Administered 2020-11-23: 500 mg via INTRAVENOUS
  Filled 2020-11-23 (×4): qty 100

## 2020-11-23 NOTE — Discharge Instructions (Signed)
Bowel Obstruction A bowel obstruction is a blockage in the small or large bowel. The bowel, which is also called the intestine, is a long, slender tube that connects the stomach to the anus. When a person eats and drinks, food and fluids go from the mouth to the stomach to the small bowel. This is where most of the nutrients in the food and fluids are absorbed. After the small bowel, material passes through the large bowel for further absorption until any leftover material leaves the body as stool through the anus during a bowel movement. A bowel obstruction will prevent food and fluids from passing through the bowel as they normally do during digestion. The bowel can become partially or completely blocked. If this condition is not treated, it can be dangerous because the bowel could rupture. What are the causes? Common causes of this condition include:  Scar tissue (adhesions) from previous surgery or treatment with high-energy X-rays (radiation).  Recent surgery. This may cause the movements of the bowel to slow down and cause food to block the intestine.  Inflammatory bowel disease, such as Crohn's disease or diverticulitis.  Growths or tumors.  A bulging organ (hernia).  Twisting of the bowel (volvulus).  A foreign body.  Slipping of a part of the bowel into another part (intussusception).   What are the signs or symptoms? Symptoms of this condition include:  Pain in the abdomen. Depending on the degree of obstruction, pain may be: ? Mild or severe. ? Dull cramping or sharp pain. ? In one area or in the entire abdomen.  Nausea and vomiting. Vomit may be greenish or a yellow bile color.  Bloating in the abdomen.  Difficulty passing stool (constipation).  Lack of passing gas.  Frequent belching.  Diarrhea. This may occur if the obstruction is partial and runny stool is able to leak around the obstruction. How is this diagnosed? This condition may be diagnosed based on:  A  physical exam.  Medical history.  Imaging tests of the abdomen or pelvis, such as X-ray or CT scan.  Blood or urine tests. How is this treated? Treatment for this condition depends on the cause and severity of the problem. Treatment may include:  Fluids and pain medicines that are given through an IV. Your health care provider may instruct you not to eat or drink if you have nausea or vomiting.  Eating a simple diet. You may be asked to consume a clear liquid diet for several days. This allows the bowel to rest.  Placement of a small tube (nasogastric tube) into the stomach. This will relieve pain, discomfort, and nausea by removing blocked air and fluids from the stomach. It can also help the obstruction clear up faster.  Surgery. This may be required if other treatments do not work. Surgery may be required for: ? Bowel obstruction from a hernia. This can be an emergency procedure. ? Scar tissue that causes frequent or severe obstructions. Follow these instructions at home: Medicines  Take over-the-counter and prescription medicines only as told by your health care provider.  If you were prescribed an antibiotic medicine, take it as told by your health care provider. Do not stop taking the antibiotic even if you start to feel better. General instructions  Follow instructions from your health care provider about eating restrictions. You may need to avoid solid foods and consume only clear liquids until your condition improves.  Return to your normal activities as told by your health care provider. Ask your  health care provider what activities are safe for you.  Avoid sitting for a long time without moving. Get up to take short walks every 1-2 hours. This is important to improve blood flow and breathing. Ask for help if you feel weak or unsteady.  Keep all follow-up visits as told by your health care provider. This is important. How is this prevented? After having a bowel  obstruction, you are more likely to have another. You may do the following things to prevent another obstruction:  If you have a long-term (chronic) disease, pay attention to your symptoms and contact your health care provider if you have questions or concerns.  Avoid becoming constipated. To prevent or treat constipation, your health care provider may recommend that you: ? Drink enough fluid to keep your urine pale yellow. ? Take over-the-counter or prescription medicines. ? Eat foods that are high in fiber, such as beans, whole grains, and fresh fruits and vegetables. ? Limit foods that are high in fat and processed sugars, such as fried or sweet foods.  Stay active. Exercise for 30 minutes or more, 5 or more days each week. Ask your health care provider which exercises are safe for you.  Avoid stress. Find ways to reduce stress, such as meditation, exercise, or taking time for activities that relax you.  Instead of eating three large meals each day, eat three small meals with three small snacks.  Work with a Microbiologist to make a healthy meal plan that works for you.  Do not use any products that contain nicotine or tobacco, such as cigarettes and e-cigarettes. If you need help quitting, ask your health care provider.   Contact a health care provider if you:  Have a fever.  Have chills. Get help right away if you:  Have increased pain or cramping.  Vomit blood.  Have uncontrolled vomiting or nausea.  Cannot drink fluids because of vomiting or pain.  Become confused.  Begin feeling very thirsty (dehydrated).  Have severe bloating.  Feel extremely weak or you faint. Summary  A bowel obstruction is a blockage in the small or large bowel.  A bowel obstruction will prevent food and fluids from passing through the bowel as they normally do during digestion.  Treatment for this condition depends on the cause and severity of the problem. It may include fluids and pain  medicines through an IV, a simple diet, a nasogastric tube, or surgery.  Follow instructions from your health care provider about eating restrictions. You may need to avoid solid foods and consume only clear liquids until your condition improves. This information is not intended to replace advice given to you by your health care provider. Make sure you discuss any questions you have with your health care provider. Document Revised: 11/04/2018 Document Reviewed: 02/09/2018 Elsevier Patient Education  2021 [employer].

## 2020-11-23 NOTE — Progress Notes (Signed)
OT Cancellation Note  Patient Details Name: Luis Sweeney MRN: [id number] DOB: July 01, 1941   Cancelled Treatment:    Reason Eval/Treat Not Completed: OT screened, no needs identified, will sign off  Upon chart review and in speaking with patient, he appears to be at his functional baseline with ADLs. Will complete OT order at this time as no f/u needs are anticipated. Thank you.  Gerrianne Scale, [place], OTR/L ascom [phone] 11/23/20, 2:04 PM

## 2020-11-23 NOTE — Plan of Care (Signed)
Continuing with plan of care. 

## 2020-11-23 NOTE — Discharge Summary (Signed)
Physician Discharge Summary  Luis Sweeney SWN:[id number] DOB: 1941/03/29 DOA: 11/21/2020  PCP: Ria Bush, MD  Admit date: 11/21/2020 Discharge date: 11/23/2020  Admitted From: Home Disposition:  Home  Recommendations for Outpatient Follow-up:  1. Follow up with PCP in 1-2 weeks 2.   Home Health:No Equipment/Devices:None Discharge Condition:Stable CODE STATUS:FUll Diet recommendation: Liquid   Brief/Interim Summary:  80 y.o.malewith medical history significant ofhypertension, hyperlipidemia, CAD, CKD 3, diabetes, GERD, duodenal stricture who presents with 2 days of GI symptoms and an episode of near syncope today. Patient reports he began having symptoms earlier this week which became more significant 2 days ago consisting of nausea, vomiting, diarrhea along with some dizziness and weakness. He reports that today he had an episode of weakness, dizziness, sweatiness and nearlypassed out when he was standing up afterhaving a bowel movement. When evaluated by EMS his blood pressure was initially in the 93G systolic there were unable to get an IV for a bolus in route but his blood pressure did spontaneously improve to the 182 systolic.  Patient abdominal symptoms starting to improve.  He is passing gas and having bowel sounds.  Belly remained soft.  Kidney function starting to improve.  On the day of discharge patient tolerating the liquid diet without issue.  No nausea or vomiting.  Belly remains soft.  Patient ambulated around the hallway with nursing without issue.  No outpatient PT needs identified.  Repeat KUB showed resolving SBO versus ileus.  I had a lengthy conversation with the patient at bedside.  I explained the results of his KUB.  Patient states that he is the primary caregiver for his wife who is post CVA and would much prefer to get back home today.  Given his overall hemodynamic stability I have agreed to discharge home today.  I have instructed patient  to maintain a clear liquid diet for least the next 24 to 48 hours and if he is having bowel movements and belly remained soft and nontender he can slowly start advancing his diet from there.  I encourage p.o. fluid intake on discharge as well.  I gave him clear return to ED instructions should he develop worsening abdominal pain, distention, fevers, chills, diaphoresis.  Will prescribe empiric course of Augmentin for suspected infectious partial obstruction.  Patient is stable, pain-free at time of discharge.  Discharge Diagnoses:  Principal Problem:   SBO (small bowel obstruction) (HCC) Active Problems:   Coronary atherosclerosis of native coronary artery   GERD   Duodenal stricture - recurrent   Controlled diabetes mellitus type 2 with complications (HCC)   Acute renal failure superimposed on stage 3 chronic kidney disease (HCC)   Hyperkalemia   Leukocytosis  Partial small bowel obstruction Patient with history of duodenal stricture Partial SBO noted on CT abdomen pelvis No nausea or vomiting reported Some persistent diarrhea Belly remains soft Patient tolerating clear liquid diet without issue at time of discharge See hospital course for specific instructions provided to patient Discharge home at this time Transition to Augmentin to complete 5 additional days for possible infectious mild suction Clear return to ED instructions provided  AKI on CKD stage IIIb, resolved Hyperkalemia, resolved Creatinine on admission 2.3, baseline 1.7 Started on intravenous fluids Kidney function improving Patient function at baseline at time of discharge  Leukocytosis, resolved White blood cell 17 on admission Reactive versus possibly infectious Patient did receive Rocephin and metronidazole Normal white cell count at time of discharge  Coronary disease Hypertension Possible vagal episode and transient  hypotension Can resume home blood pressure and cholesterol medication at  discharge Continue home aspirin  GERD PPI  Discharge Instructions  Discharge Instructions    Diet - low sodium heart healthy   Complete by: As directed    Increase activity slowly   Complete by: As directed      Allergies as of 11/23/2020      Reactions   Prednisone Other (See Comments)   Feels really bad   Codeine Hives, Itching   Lipitor [atorvastatin] Other (See Comments)   Reaction:  Muscle pain    Morphine Hives, Itching   Simvastatin Other (See Comments)   Reaction:  Muscle pain       Medication List    TAKE these medications   amoxicillin-clavulanate 875-125 MG tablet Commonly known as: Augmentin Take 1 tablet by mouth 2 (two) times daily for 4 days. Start taking on: November 24, 2020   aspirin EC 81 MG tablet Take 81 mg by mouth daily.   benzonatate 200 MG capsule Commonly known as: TESSALON Take 1 capsule (200 mg total) by mouth 3 (three) times daily as needed for cough.   lovastatin 40 MG tablet Commonly known as: MEVACOR Take 1 tablet (40 mg total) by mouth every Monday, Wednesday, and Friday.   pantoprazole 40 MG tablet Commonly known as: PROTONIX Take 1 tablet (40 mg total) by mouth daily. What changed: additional instructions   ramipril 5 MG capsule Commonly known as: ALTACE TAKE 1 CAPSULE BY MOUTH TWICE DAILY   vitamin B-12 1000 MCG tablet Commonly known as: CYANOCOBALAMIN Take 1,000 mcg by mouth daily.   Vitamin D 125 MCG (5000 UT) Caps Take 1 capsule by mouth once a week.       Allergies  Allergen Reactions  . Prednisone Other (See Comments)    Feels really bad  . Codeine Hives and Itching  . Lipitor [Atorvastatin] Other (See Comments)    Reaction:  Muscle pain   . Morphine Hives and Itching  . Simvastatin Other (See Comments)    Reaction:  Muscle pain     Consultations:  None   Procedures/Studies: CT Abdomen Pelvis Wo Contrast  Result Date: 11/21/2020 CLINICAL DATA:  Abdominal pain, nausea vomiting EXAM: CT ABDOMEN  AND PELVIS WITHOUT CONTRAST TECHNIQUE: Multidetector CT imaging of the abdomen and pelvis was performed following the standard protocol without IV contrast. COMPARISON:  September 27, 2010 FINDINGS: Lower chest: The visualized heart size within normal limits. No pericardial fluid/thickening. No hiatal hernia. The visualized portions of the lungs are clear. Multiple low-density lesions are seen again throughout the liver parenchyma the largest measuring 3 cm within the inferior right liver lobe, likely hepatic cyst. No evidence of calcified gallstones or biliary ductal dilatation. Pancreas:  Unremarkable.  No surrounding inflammatory changes. Spleen: Normal in size. Although limited due to the lack of intravenous contrast, normal in appearance. Adrenals/Urinary Tract: Both adrenal glands appear normal. The kidneys and collecting system appear normal without evidence of urinary tract calculus or hydronephrosis. Bladder is unremarkable. Stomach/Bowel: The stomach is normal in appearance. At the level of the proximal jejunum there is mildly dilated fluid and air-filled loops small bowel measuring up to 3.2 cm to the level of the distal ileum where there is a focal area of narrowing within the right lower quadrant with mild adjacent fat stranding changes. The terminal ileum appears to be decompressed. Scattered colonic diverticula are noted without surrounding inflammatory changes. Vascular/Lymphatic: There are no enlarged abdominal or pelvic lymph nodes. Scattered aortic atherosclerotic  calcifications are seen without aneurysmal dilatation. Reproductive: The prostate is unremarkable. Other: A right fat and colon containing inguinal hernia is noted. There is a left-sided fat containing inguinal hernia. Musculoskeletal: No acute or significant osseous findings. IMPRESSION: Findings consistent with a partial small bowel obstruction to the level of the distal ileum where there is a focal area of narrowing with question of  mild enteritis. The terminal ileum is decompressed. Aortic Atherosclerosis (ICD10-I70.0). Diverticulosis without diverticulitis Electronically Signed   By: Prudencio Pair M.D.   On: 11/21/2020 21:31   DG Chest 2 View  Result Date: 11/21/2020 CLINICAL DATA:  Nausea, vomiting, diarrhea. Dizziness and weakness for the past 2 days. EXAM: CHEST - 2 VIEW COMPARISON:  Chest x-ray dated July 04, 2016. FINDINGS: The heart size and mediastinal contours are within normal limits. Both lungs are clear. No acute osseous abnormality. IMPRESSION: No active cardiopulmonary disease. Electronically Signed   By: Titus Dubin M.D.   On: 11/21/2020 20:20   DG Abd 1 View  Result Date: 11/23/2020 CLINICAL DATA:  80 year old male with abdominal pain. Partial small bowel obstruction suspected on CT Abdomen and Pelvis 11/21/2020. EXAM: ABDOMEN - 1 VIEW COMPARISON:  11/21/2020. FINDINGS: Views of the abdomen and pelvis at 0711 hours. Somewhat increased large bowel gas from the prior CT. Gas-filled mildly dilated small bowel persists in the left abdomen. No pneumoperitoneum is evident. Other abdominal and pelvic visceral contours appear stable, negative. No acute osseous abnormality identified. IMPRESSION: Improved but not resolved abnormal bowel gas pattern since the CT 11/21/2020. Appearance now could reflect resolving partial small bowel obstruction or a generalized ileus. Electronically Signed   By: Genevie Ann M.D.   On: 11/23/2020 08:59    (Echo, Carotid, EGD, Colonoscopy, ERCP)    Subjective: Patient seen and examined on the day of discharge.  Stable, no distress.  Stable for discharge home at this time.  Discharge Exam: Vitals:   11/23/20 0517 11/23/20 1142  BP: 108/82 112/61  Pulse: 100 85  Resp: 20 19  Temp: 98.2 F (36.8 C) 99 F (37.2 C)  SpO2: 94% 94%   Vitals:   11/22/20 1929 11/23/20 0448 11/23/20 0517 11/23/20 1142  BP: 121/62  108/82 112/61  Pulse: 93  100 85  Resp: 20  20 19   Temp: 98.2 F  (36.8 C)  98.2 F (36.8 C) 99 F (37.2 C)  TempSrc: Oral  Oral Oral  SpO2: 91%  94% 94%  Weight:  109.4 kg    Height:        General: Pt is alert, awake, not in acute distress Cardiovascular: RRR, S1/S2 +, no rubs, no gallops Respiratory: CTA bilaterally, no wheezing, no rhonchi Abdominal: Soft, NT, ND, bowel sounds + Extremities: no edema, no cyanosis    The results of significant diagnostics from this hospitalization (including imaging, microbiology, ancillary and laboratory) are listed below for reference.     Microbiology: Recent Results (from the past 240 hour(s))  SARS CORONAVIRUS 2 (TAT 6-24 HRS) Nasopharyngeal Nasopharyngeal Swab     Status: None   Collection Time: 11/21/20 10:18 PM   Specimen: Nasopharyngeal Swab  Result Value Ref Range Status   SARS Coronavirus 2 NEGATIVE NEGATIVE Final    Comment: (NOTE) SARS-CoV-2 target nucleic acids are NOT DETECTED.  The SARS-CoV-2 RNA is generally detectable in upper and lower respiratory specimens during the acute phase of infection. Negative results do not preclude SARS-CoV-2 infection, do not rule out co-infections with other pathogens, and should not be used as the  sole basis for treatment or other patient management decisions. Negative results must be combined with clinical observations, patient history, and epidemiological information. The expected result is Negative.  Fact Sheet for Patients: SugarRoll.be  Fact Sheet for Healthcare Providers: https://www.woods-mathews.com/  This test is not yet approved or cleared by the [place] FDA and  has been authorized for detection and/or diagnosis of SARS-CoV-2 by FDA under an Emergency Use Authorization (EUA). This EUA will remain  in effect (meaning this test can be used) for the duration of the COVID-19 declaration under Se ction 564(b)(1) of the Act, 21 U.S.C. section 360bbb-3(b)(1), unless the authorization is  terminated or revoked sooner.  Performed at [facility], [place] [address], [place], [place] [address]      Labs: BNP (last 3 results) No results for input(s): BNP in the last 8760 hours. Basic Metabolic Panel: Recent Labs  Lab 11/21/20 1949 11/22/20 0505 11/23/20 0748  NA 135 136 138  K 5.3* 4.6 4.6  CL 103 107 104  CO2 22 22 26   GLUCOSE 145* 119* 102*  BUN 26* 28* 20  CREATININE 2.32* 2.06* 1.82*  CALCIUM 9.5 8.3* 8.5*  MG  --   --  1.8   Liver Function Tests: Recent Labs  Lab 11/21/20 1949 11/22/20 0505  AST 27 17  ALT 17 13  ALKPHOS 33* 25*  BILITOT 1.0 0.6  PROT 8.2* 5.9*  ALBUMIN 4.4 3.1*   No results for input(s): LIPASE, AMYLASE in the last 168 hours. No results for input(s): AMMONIA in the last 168 hours. CBC: Recent Labs  Lab 11/21/20 1949 11/22/20 0505 11/23/20 0748  WBC 17.2* 8.4 6.3  NEUTROABS 14.9*  --  4.4  HGB 14.5 11.4* 12.1*  HCT 44.8 35.3* 36.7*  MCV 96.6 96.4 96.6  PLT 277 238 229   Cardiac Enzymes: No results for input(s): CKTOTAL, CKMB, CKMBINDEX, TROPONINI in the last 168 hours. BNP: Invalid input(s): POCBNP CBG: Recent Labs  Lab 11/22/20 2020  GLUCAP 85   D-Dimer No results for input(s): DDIMER in the last 72 hours. Hgb A1c No results for input(s): HGBA1C in the last 72 hours. Lipid Profile No results for input(s): CHOL, HDL, LDLCALC, TRIG, CHOLHDL, LDLDIRECT in the last 72 hours. Thyroid function studies No results for input(s): TSH, T4TOTAL, T3FREE, THYROIDAB in the last 72 hours.  Invalid input(s): FREET3 Anemia work up No results for input(s): VITAMINB12, FOLATE, FERRITIN, TIBC, IRON, RETICCTPCT in the last 72 hours. Urinalysis    Component Value Date/Time   COLORURINE AMBER (A) 11/21/2020 1949   APPEARANCEUR CLOUDY (A) 11/21/2020 1949   LABSPEC 1.020 11/21/2020 1949   PHURINE 5.0 11/21/2020 1949   GLUCOSEU NEGATIVE 11/21/2020 1949   GLUCOSEU NEGATIVE 12/04/2010 1013   HGBUR SMALL (A) 11/21/2020 1949    HGBUR moderate 12/18/2009 1009   BILIRUBINUR NEGATIVE 11/21/2020 1949   BILIRUBINUR negative 11/18/2020 1012   KETONESUR NEGATIVE 11/21/2020 1949   PROTEINUR 100 (A) 11/21/2020 1949   UROBILINOGEN 0.2 11/18/2020 1012   UROBILINOGEN 0.2 12/04/2010 1013   NITRITE NEGATIVE 11/21/2020 1949   LEUKOCYTESUR NEGATIVE 11/21/2020 1949   Sepsis Labs Invalid input(s): PROCALCITONIN,  WBC,  LACTICIDVEN Microbiology Recent Results (from the past 240 hour(s))  SARS CORONAVIRUS 2 (TAT 6-24 HRS) Nasopharyngeal Nasopharyngeal Swab     Status: None   Collection Time: 11/21/20 10:18 PM   Specimen: Nasopharyngeal Swab  Result Value Ref Range Status   SARS Coronavirus 2 NEGATIVE NEGATIVE Final    Comment: (NOTE) SARS-CoV-2 target nucleic  acids are NOT DETECTED.  The SARS-CoV-2 RNA is generally detectable in upper and lower respiratory specimens during the acute phase of infection. Negative results do not preclude SARS-CoV-2 infection, do not rule out co-infections with other pathogens, and should not be used as the sole basis for treatment or other patient management decisions. Negative results must be combined with clinical observations, patient history, and epidemiological information. The expected result is Negative.  Fact Sheet for Patients: SugarRoll.be  Fact Sheet for Healthcare Providers: https://www.woods-mathews.com/  This test is not yet approved or cleared by the [place] FDA and  has been authorized for detection and/or diagnosis of SARS-CoV-2 by FDA under an Emergency Use Authorization (EUA). This EUA will remain  in effect (meaning this test can be used) for the duration of the COVID-19 declaration under Se ction 564(b)(1) of the Act, 21 U.S.C. section 360bbb-3(b)(1), unless the authorization is terminated or revoked sooner.  Performed at [facility], [place] [address] [place], [place] [address]      Time coordinating  discharge: Over 30 minutes  SIGNED:   Sidney Ace, MD  [facility] 11/23/2020, 2:25 PM Pager   If 7PM-7AM, please contact night-coverage

## 2020-11-23 NOTE — Plan of Care (Signed)
Discharge teaching completed with patient who is in stable condition. 

## 2020-11-23 NOTE — Evaluation (Signed)
Physical Therapy Evaluation Patient Details Name: Luis Sweeney MRN: [id number] DOB: 05-Apr-1941 Today's Date: 11/23/2020   History of Present Illness  Pt is an 80 y/o M admitted on 11/21/20 with c/c of GI upset & near syncopal episode. CT revealed partial SBO & pt currently being treated for possible infectious bowel obstruction. 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 Pt currently mod I for mobility, ambulating 3 laps around nurses station. Pt with no acute deficits observed & pt denying any mobility concerns & return home upon d/c. No acute PT needs at this time, will sign off.     Follow Up Recommendations No PT follow up    Equipment Recommendations  None recommended by PT    Recommendations for Other Services       Precautions / Restrictions Precautions Precautions: None Restrictions Weight Bearing Restrictions: No      Mobility  Bed Mobility Overal bed mobility: Modified Independent             General bed mobility comments: supine<>Sit with HOB elevated    Transfers Overall transfer level: Modified independent                  Ambulation/Gait Ambulation/Gait assistance: Modified independent (Device/Increase time) Gait Distance (Feet):  (3 laps around nurses station, 500 ft total) Assistive device: IV Pole;None       General Gait Details: Pt ambulates first 2 laps without AD, last lap pushing IV pole to simulate when he wants to ambulate without assistance in hospital. Mod I overall.  Stairs            Wheelchair Mobility    Modified Rankin (Stroke Patients Only)       Balance Overall balance assessment: Mild deficits observed, not formally tested Sitting-balance support: No upper extremity  supported;Feet supported Sitting balance-Leahy Scale: Good       Standing balance-Leahy Scale: Good Standing balance comment: ambulatory without AD without LOB                             Pertinent Vitals/Pain Pain Assessment: No/denies pain    Home Living Family/patient expects to be discharged to:: Private residence Living Arrangements: Spouse/significant other Available Help at Discharge: Family;Available 24 hours/day Type of Home: House Home Access: Stairs to enter   [employer] of Steps: 2 steps without rails to enter main entrance, 5 steps with wideset B rails to enter front door Home Layout: One level Home Equipment: None      Prior Function Level of Independence: Independent         Comments: Pt independent, working as part Scientist, 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 Lives with wife who uses SPC 2/2 hx of CVA & balance issues.     Hand Dominance   Dominant Hand: Left    Extremity/Trunk Assessment   Upper Extremity Assessment Upper Extremity Assessment:  (RUE deficits 2/2 hx of Erb's Palsy from birth)    Lower Extremity Assessment Lower Extremity Assessment: Overall WFL for tasks assessed       Communication   Communication: No difficulties  Cognition Arousal/Alertness: Awake/alert Behavior During Therapy: WFL for tasks assessed/performed Overall Cognitive Status: Within Functional Limits for tasks assessed  General Comments General comments (skin integrity, edema, etc.): encouraged pt to ambulate while in hospital    Exercises     Assessment/Plan    PT Assessment Patent does not need any further PT services  PT Problem List         PT Treatment Interventions      PT Goals (Current goals can be found in the Care Plan section)  Acute Rehab PT Goals Patient Stated Goal: hear results from x ray PT Goal Formulation: With patient Time For Goal Achievement: 12/07/20 Potential to Achieve  Goals: Good    Frequency     Barriers to discharge        Co-evaluation               AM-PAC PT "6 Clicks" Mobility  Outcome Measure Help needed turning from your back to your side while in a flat bed without using bedrails?: None Help needed moving from lying on your back to sitting on the side of a flat bed without using bedrails?: None Help needed moving to and from a bed to a chair (including a wheelchair)?: None Help needed standing up from a chair using your arms (e.g., wheelchair or bedside chair)?: None Help needed to walk in hospital room?: None Help needed climbing 3-5 steps with a railing? : None 6 Click Score: 24    End of Session Equipment Utilized During Treatment: Gait belt Activity Tolerance: Patient tolerated treatment well Patient left: in bed;with bed alarm set;with call bell/phone within reach        Time: 1103-1123 PT Time Calculation (min) (ACUTE ONLY): 20 min   Charges:   PT Evaluation $PT Eval Low Complexity: 1 Low PT Treatments $Therapeutic Activity: 8-22 mins        [name], PT, DPT 11/23/20, 11:40 AM   [name] 11/23/2020, 11:39 AM

## 2020-11-25 ENCOUNTER — Telehealth: Payer: Self-pay

## 2020-11-25 NOTE — Telephone Encounter (Signed)
Transition Care Management Follow-up Telephone Call  Date of discharge and from where: 11/23/2020, [facility]  How have you been since you were released from the hospital? Patient states that he is doing okay still having some diarrhea. Eating a soft diet currently.   Any questions or concerns? No  Items Reviewed:  Did the pt receive and understand the discharge instructions provided? Yes   Medications obtained and verified? Yes   Other? No   Any new allergies since your discharge? No   Dietary orders reviewed? Yes  Do you have support at home? Yes   Home Care and Equipment/Supplies: Were home health services ordered? not applicable If so, what is the name of the agency? N/A  Has the agency set up a time to come to the patient's home? not applicable Were any new equipment or medical supplies ordered?  No What is the name of the medical supply agency? N/A Were you able to get the supplies/equipment? not applicable Do you have any questions related to the use of the equipment or supplies? No  Functional Questionnaire: (I = Independent and D = Dependent) ADLs: I  Bathing/Dressing- I  Meal Prep- I  Eating- I  Maintaining continence- I  Transferring/Ambulation- I  Managing Meds- I  Follow up appointments reviewed:   PCP Hospital f/u appt confirmed? Patient states that he wants to see how he feels in 2-3 days and if he is still having trouble he will call at that point and make an appointment   [facility] f/u appt confirmed? N/A   Are transportation arrangements needed? No   If their condition worsens, is the pt aware to call PCP or go to the Emergency Dept.? Yes  Was the patient provided with contact information for the PCP's office or ED? Yes  Was to pt encouraged to call back with questions or concerns? Yes

## 2020-11-27 ENCOUNTER — Telehealth: Payer: Self-pay | Admitting: Internal Medicine

## 2020-11-27 NOTE — Telephone Encounter (Signed)
Pt is requesting a call back from a nurse to discuss a CT scan he had done at the hospital, pt was admitted to the hospital last Thursday for low blood pressure, pt was told he had a blocked intestine, pt would like to know if Dr [name] could look at everything from the hospital to get his opinion.

## 2020-11-27 NOTE — Telephone Encounter (Signed)
Patient was in the ED 11/21/20 for abdominal pain and partial SBO. Plain films on 2/12 showed improvement and resolving SBO.  Patient calling for follow up appointment.  He is slowly advancing his diet and will complete the prescribed antibiotics tomorrow. He will come in and see Nicoletta Ba PA on 12/13/20 10:30

## 2020-12-13 ENCOUNTER — Ambulatory Visit: Payer: PPO | Admitting: Physician Assistant

## 2020-12-13 ENCOUNTER — Other Ambulatory Visit (INDEPENDENT_AMBULATORY_CARE_PROVIDER_SITE_OTHER): Payer: PPO

## 2020-12-13 ENCOUNTER — Encounter: Payer: Self-pay | Admitting: Physician Assistant

## 2020-12-13 VITALS — BP 106/50 | HR 65 | Ht 74.0 in | Wt 245.0 lb

## 2020-12-13 DIAGNOSIS — R935 Abnormal findings on diagnostic imaging of other abdominal regions, including retroperitoneum: Secondary | ICD-10-CM

## 2020-12-13 DIAGNOSIS — K56609 Unspecified intestinal obstruction, unspecified as to partial versus complete obstruction: Secondary | ICD-10-CM | POA: Diagnosis not present

## 2020-12-13 DIAGNOSIS — R1030 Lower abdominal pain, unspecified: Secondary | ICD-10-CM

## 2020-12-13 LAB — BASIC METABOLIC PANEL
BUN: 25 mg/dL — ABNORMAL HIGH (ref 6–23)
CO2: 30 mEq/L (ref 19–32)
Calcium: 9.1 mg/dL (ref 8.4–10.5)
Chloride: 105 mEq/L (ref 96–112)
Creatinine, Ser: 1.72 mg/dL — ABNORMAL HIGH (ref 0.40–1.50)
GFR: 37.18 mL/min — ABNORMAL LOW (ref >60.00)
Glucose, Bld: 95 mg/dL (ref 70–99)
Potassium: 4.6 mEq/L (ref 3.5–5.1)
Sodium: 139 mEq/L (ref 135–145)

## 2020-12-13 LAB — CBC WITH DIFFERENTIAL/PLATELET
Basophils Absolute: 0.1 10*3/uL (ref 0.0–0.1)
Basophils Relative: 1 % (ref 0.0–3.0)
Eosinophils Absolute: 0.1 10*3/uL (ref 0.0–0.7)
Eosinophils Relative: 1.1 % (ref 0.0–5.0)
HCT: 39.1 % (ref 39.0–52.0)
Hemoglobin: 13.2 g/dL (ref 13.0–17.0)
Lymphocytes Relative: 23.7 % (ref 12.0–46.0)
Lymphs Abs: 1.7 10*3/uL (ref 0.7–4.0)
MCHC: 33.7 g/dL (ref 30.0–36.0)
MCV: 94.2 fl (ref 78.0–100.0)
Monocytes Absolute: 0.6 10*3/uL (ref 0.1–1.0)
Monocytes Relative: 8.9 % (ref 3.0–12.0)
Neutro Abs: 4.6 10*3/uL (ref 1.4–7.7)
Neutrophils Relative %: 65.3 % (ref 43.0–77.0)
Platelets: 237 10*3/uL (ref 150.0–400.0)
RBC: 4.15 Mil/uL — ABNORMAL LOW (ref 4.22–5.81)
RDW: 14.8 % (ref 11.5–15.5)
WBC: 7 10*3/uL (ref 4.0–10.5)

## 2020-12-13 LAB — SEDIMENTATION RATE: Sed Rate: 22 mm/hr — ABNORMAL HIGH (ref 0–20)

## 2020-12-13 LAB — C-REACTIVE PROTEIN: CRP: <1 mg/dL (ref 0.5–20.0)

## 2020-12-13 NOTE — Patient Instructions (Signed)
Your provider has requested that you go to the basement level for lab work before leaving today. Press "B" on the elevator. The lab is located at the first door on the left as you exit the elevator.  Due to recent changes in healthcare laws, you may see the results of your imaging and laboratory studies on MyChart before your provider has had a chance to review them.  We understand that in some cases there may be results that are confusing or concerning to you. Not all laboratory results come back in the same time frame and the provider may be waiting for multiple results in order to interpret others.  Please give [place] 48 hours in order for your provider to thoroughly review all the results before contacting the office for clarification of your results.   You will be contacted by [facility] in the next 2 days to arrange a MRI.  The number on your caller ID will be [phone], please answer when they call.  If you have not heard from them in 2 days please call [phone] to schedule.    I appreciate the opportunity to care for you. Amy Esterwood, PA-C

## 2020-12-13 NOTE — Progress Notes (Signed)
Subjective:    Patient ID: Luis Sweeney, male    DOB: 09/07/1941, 80 y.o.   MRN: [id number]  HPI Luis Sweeney is a pleasant 80 year old white male, established with Dr. [name] who was last seen in the office in 2015.  He comes in today after recent admission 2/10 through 11/23/2020 at which time he was diagnosed with a partial small bowel obstruction and probable enteritis.  He was cared for by the hospitalist, not seen by GI. He says his symptoms had onset with diarrhea which he had had for a week or so prior to admission.  On the day of admission he had had an increase in diarrhea and then developed weakness and was found to be hypotensive.  Interestingly he did not have any significant abdominal pain at that time,.  He says he had been nauseated for a few days but did not have any vomiting.  No documented fever or chills.  CT imaging on admission without IV contrast, showed multiple low-density lesions in the liver the largest 3 cm and felt likely hepatic cysts.  There was some mild dilation of the proximal jejunum and fluid-filled loops of small bowel measuring up to 3.2 cm to the level of the distal ileum where there was a focal narrowing in the right lower quadrant with some surrounding fat stranding.  The terminal ileum was decompressed, also noted to have a right inguinal hernia containing fat and a loop of the colon. WBC was 17.2, hemoglobin 14.5/hematocrit of 44.8, BUN 26/creatinine 2.32, LFTs within normal limit. He was treated with short course of IV antibiotics, and conservative management with bowel rest.  Symptoms improved and he was discharged on 11/23/2020.  He says since that time has  still felt a bit weak.  He is having normal bowel movements, with some mild constipation,, no melena or hematochezia.Marland Kitchen  He has been able to eat without difficulty and has no significant complaints of abdominal pain. He is not had any prior episodes of obstruction, the only prior surgeries were an inguinal  hernia repair on the right.  He says that the hernia has recurred and has been bothering him over the past 6 months or so with just some pressure.  He has not had any significant pain in that area.  He says he had been seen by a surgeon who wanted him to be cleared by cardiology prior to considering repair of the hernia.  Cardiology wanted him to have a stress test which he did not want to proceed with at that time but says he may go back and go through work-up , in case he needs to have surgery.  Patient has history of coronary artery disease, status post MI, NAFLD, chronic kidney disease stage III, adult onset diabetes mellitus, osteoarthritis, he has prior history of peptic ulcer disease in 2008 which had been complicated by duodenal stricture.  He had EGD and colonoscopy in 2015, at EGD noted to have a friable stricture in the second portion of the duodenum which was not traversable and this was suspected to be secondary to remote ulcer.  He had been treated with PPI. Colonoscopy at that same time was negative other than severe diverticulosis in the sigmoid colon.  Review of Systems Pertinent positive and negative review of systems were noted in the above HPI section.  All other review of systems was otherwise negative.  Outpatient Encounter Medications as of 12/13/2020  Medication Sig  . aspirin EC 81 MG tablet Take 81 mg by  mouth daily.  . benzonatate (TESSALON) 200 MG capsule Take 1 capsule (200 mg total) by mouth 3 (three) times daily as needed for cough.  . Cholecalciferol (VITAMIN D) 125 MCG (5000 UT) CAPS Take 1 capsule by mouth once a week.  . lovastatin (MEVACOR) 40 MG tablet Take 1 tablet (40 mg total) by mouth every Monday, Wednesday, and Friday.  . pantoprazole (PROTONIX) 40 MG tablet Take 1 tablet (40 mg total) by mouth daily. (Patient taking differently: Take 40 mg by mouth daily. As needed)  . ramipril (ALTACE) 5 MG capsule TAKE 1 CAPSULE BY MOUTH TWICE DAILY  . vitamin B-12  (CYANOCOBALAMIN) 1000 MCG tablet Take 1,000 mcg by mouth daily.   No facility-administered encounter medications on file as of 12/13/2020.   Allergies  Allergen Reactions  . Prednisone Other (See Comments)    Feels really bad  . Codeine Hives and Itching  . Lipitor [Atorvastatin] Other (See Comments)    Reaction:  Muscle pain   . Morphine Hives and Itching  . Simvastatin Other (See Comments)    Reaction:  Muscle pain    Patient Active Problem List   Diagnosis Date Noted  . SBO (small bowel obstruction) ([place]) 11/21/2020  . Acute renal failure superimposed on stage 3 chronic kidney disease ([place]) 11/21/2020  . Hyperkalemia 11/21/2020  . Leukocytosis 11/21/2020  . Primary osteoarthritis of right hip 06/19/2020  . Acute left-sided low back pain with left-sided sciatica 05/17/2019  . Essential hypertension 09/27/2018  . Health maintenance examination 02/25/2016  . Controlled diabetes mellitus type 2 with complications ([place]) 42/35/3614  . Irregular heart beat 02/25/2016  . Advanced care planning/counseling discussion 02/20/2015  . Obesity, Class I, BMI 30-34.9 02/20/2015  . Duodenal stricture - recurrent 09/21/2014  . Hx of peptic ulcer 06/11/2014  . NAFLD (nonalcoholic fatty liver disease)   . Medicare annual wellness visit, subsequent 05/31/2012  . Vitamin B12 deficiency 05/31/2012  . Recurrent right inguinal hernia 05/31/2012  . Dysuria 11/26/2010  . Coronary atherosclerosis of native coronary artery 03/13/2009  . CKD stage 3 due to type 2 diabetes mellitus ([place]) 03/13/2009  . Hyperlipidemia associated with type 2 diabetes mellitus ([place]) 01/04/2008  . History of colonic polyps 11/07/2007  . GERD 03/08/2007  . MYOCARDIAL INFARCTION, HX OF 03/07/2007  . ERB'S PALSY 03/07/2007   Social History   Socioeconomic History  . Marital status: Married    Spouse name: Not on file  . Number of children: 3  . Years of education: Not on file  . Highest education level: Not on file   Occupational History  . Occupation: HT Warden/ranger: [employer]    Comment: metal Fabrication/Stairs/Rails commerial  Tobacco Use  . Smoking status: Former Smoker    Packs/day: 1.50    Years: 3.00    Pack years: 4.50    Quit date: 02/16/1985    Years since quitting: 35.8  . Smokeless tobacco: Never Used  Substance and Sexual Activity  . Alcohol use: Yes    Alcohol/week: 0.0 standard drinks    Comment: two to three times a year  . Drug use: No  . Sexual activity: Never    Comment: last few years.  Other Topics Concern  . Not on file  Social History Narrative   Daily caffeine use 3 per day   Lives with wife    Activity: no regular exercise   Diet: tries to avoid sweets, some water, fruits/vegetables occasionally   Social Determinants of  Health   Financial Resource Strain: Low Risk   . Difficulty of Paying Living Expenses: Not hard at all  Food Insecurity: No Food Insecurity  . Worried About Charity fundraiser in the Last Year: Never true  . Ran Out of Food in the Last Year: Never true  Transportation Needs: No Transportation Needs  . Lack of Transportation (Medical): No  . Lack of Transportation (Non-Medical): No  Physical Activity: Inactive  . Days of Exercise per Week: 0 days  . Minutes of Exercise per Session: 0 min  Stress: No Stress Concern Present  . Feeling of Stress : Not at all  Social Connections: Not on file  Intimate Partner Violence: Not At Risk  . Fear of Current or Ex-Partner: No  . Emotionally Abused: No  . Physically Abused: No  . Sexually Abused: No    Luis Sweeney family history includes Arthritis in his sister; Cancer in his sister; Diabetes in his father and sister; Heart disease in his father; Heart failure in his father; Hypertension in his sister and sister; Kidney disease in his father; Ovarian cancer in an other family member.      Objective:    Vitals:   12/13/20 1031  BP: (!) 106/50  Pulse: 65    Physical  Exam Well-developed well-nourished elderly white male in no acute distress.  Pleasant height, Weight, 245 BMI 31.4  HEENT; nontraumatic normocephalic, EOMI, PE R LA, sclera anicteric. Oropharynx; not examined today Neck; supple, no JVD Cardiovascular; regular rate and rhythm with S1-S2, no murmur rub or gallop Pulmonary; Clear bilaterally Abdomen; soft, obese, he is tender in the left mid and left lower quadrant, no rebound nondistended, no palpable mass or hepatosplenomegaly, bowel sounds are active Rectal; not done today Skin; benign exam, no jaundice rash or appreciable lesions Extremities; no clubbing cyanosis or edema skin warm and dry.  Paresis right upper extremity Neuro/Psych; alert and oriented x4, grossly nonfocal mood and affect appropriate       Assessment & Plan:   #54 80 year old white male with recent hospitalization with partial small bowel obstruction and abnormal CT scan showing a focal narrowing in the distal ileum with some surrounding fat stranding.  The terminal ileum was decompressed.  Patient also has known right inguinal hernia which on CT showed a loop of colon but no small bowel.  This episode was treated as an enteritis, with short course of antibiotics.  Prior to hospitalization patient had been experiencing some diarrhea for about 2 weeks, a few days of nausea then developed significant weakness and low blood pressure.  He never had any significant abdominal pain, no fever no vomiting.  He remains tender in the left mid and left lower abdomen on exam today  Etiology of the focal narrowing in the distal ileum is not clear, rule out secondary to adhesions, rule out possibly infectious, rule out ischemic, rule out IBD, rule out neoplasm.  #2 history of duodenal stricture secondary to remote peptic ulcer disease 3.  Coronary artery disease status post MI 4.  GERD 5.  Nonalcoholic fatty liver disease 6.  Chronic kidney disease stage III 7.  Adult onset diabetes  mellitus 8.  Diverticulosis #9 symptomatic right inguinal hernia  Plan; CBC with differential, be met, sed rate, CRP. Patient will be scheduled for MR enterography. He is advised to call should he have any recurrence of symptoms of diarrhea nausea or development of abdominal pain. Further recommendations pending results of labs and MRI. We will schedule him  a follow-up with Dr. [name] in about 3 weeks.  Luis Cardinal Genia Keenon PA-C 12/13/2020   Cc: Ria Bush, MD

## 2020-12-18 DIAGNOSIS — M25512 Pain in left shoulder: Secondary | ICD-10-CM | POA: Diagnosis not present

## 2020-12-31 ENCOUNTER — Other Ambulatory Visit: Payer: Self-pay

## 2020-12-31 ENCOUNTER — Ambulatory Visit (HOSPITAL_COMMUNITY)
Admission: RE | Admit: 2020-12-31 | Discharge: 2020-12-31 | Disposition: A | Discharge status: Home / Self Care | Payer: PPO | Source: Ambulatory Visit | Attending: Physician Assistant | Admitting: Physician Assistant

## 2020-12-31 DIAGNOSIS — R935 Abnormal findings on diagnostic imaging of other abdominal regions, including retroperitoneum: Secondary | ICD-10-CM | POA: Diagnosis not present

## 2020-12-31 DIAGNOSIS — R1030 Lower abdominal pain, unspecified: Secondary | ICD-10-CM | POA: Diagnosis not present

## 2020-12-31 DIAGNOSIS — K56609 Unspecified intestinal obstruction, unspecified as to partial versus complete obstruction: Secondary | ICD-10-CM | POA: Diagnosis not present

## 2020-12-31 DIAGNOSIS — K7689 Other specified diseases of liver: Secondary | ICD-10-CM | POA: Diagnosis not present

## 2020-12-31 DIAGNOSIS — K5750 Diverticulosis of both small and large intestine without perforation or abscess without bleeding: Secondary | ICD-10-CM | POA: Diagnosis not present

## 2020-12-31 MED ORDER — BARIUM SULFATE 0.1 % PO SUSP
450.0000 mL | Freq: Once | ORAL | Status: AC
  Administered 2020-12-31: 1 mL via ORAL

## 2020-12-31 MED ORDER — GADOBUTROL 1 MMOL/ML IV SOLN
9.0000 mL | Freq: Once | INTRAVENOUS | Status: AC | PRN
  Administered 2020-12-31: 9 mL via INTRAVENOUS

## 2021-01-15 ENCOUNTER — Encounter: Payer: Self-pay | Admitting: Internal Medicine

## 2021-01-15 ENCOUNTER — Ambulatory Visit: Payer: PPO | Admitting: Internal Medicine

## 2021-01-15 VITALS — BP 108/56 | HR 84 | Ht 74.0 in | Wt 238.5 lb

## 2021-01-15 DIAGNOSIS — K4091 Unilateral inguinal hernia, without obstruction or gangrene, recurrent: Secondary | ICD-10-CM

## 2021-01-15 DIAGNOSIS — K56609 Unspecified intestinal obstruction, unspecified as to partial versus complete obstruction: Secondary | ICD-10-CM | POA: Diagnosis not present

## 2021-01-15 NOTE — Progress Notes (Signed)
Luis Sweeney 80 y.o. 12-07-1940 [id number]  Assessment & Plan:   Encounter Diagnoses  Name Primary?  . SBO (small bowel obstruction) ([place]) Yes  . Recurrent right inguinal hernia     Observe for recurrent small bowel obstruction presumably adhesions other process.  What ever it was is resolved and I do not think there is need for further testing.  Regarding his hernia I gave him some information about hernias symptoms and repair.  I told him it is really elective.  However I explained the rationale for needing cardiac clearance and reducing the risk of cardiac event related to the surgery.  He was grateful and seemed to express better understanding of this process.  He will discuss with his cardiologist or PA in follow-up soon.  I appreciate the opportunity to care for this patient. CC: Luis Bush, MD Luis Luna, MD Luis Mocha, MD Luis Kail PA  Subjective:   Chief Complaint: Follow-up after MRI abnormal ileum on CT  HPI Luis Sweeney is here after having seen Luis Sweeney in follow-up after he was hospitalized with a small bowel obstruction.  A CT scan showed some terminal ileal thickening and inflammation.  She ordered an MR enterography which showed a normal terminal ileum and no other abnormalities.  He is feeling fine.  He does have a symptomatic right inguinal hernia and seen Luis Sweeney but was told in order to have surgical repair of this he needed to have cardiac clearance.  The patient has a history of having had some type of functional pharmacologic stress test way back in the first decade of the 2000s (not in the EMR) and he felt very bad with weakness and nausea and valid never to do another pharmacologic stress test again.  This was sometime after stenting for MI.  So he saw cardiology last fall but refused to do a stress test.  He has a follow-up pending.  The hernia gets sore intermittently but does not sound like it is incarcerated.  It  was evident on his CT scan and it contains some: He has a fat-containing left inguinal hernia.  Wt Readings from Last 3 Encounters:  01/15/21 238 lb 8 oz (108.2 kg)  12/13/20 245 lb (111.1 kg)  11/23/20 241 lb 2.9 oz (109.4 kg)    Allergies  Allergen Reactions  . Prednisone Other (See Comments)    Feels really bad  . Codeine Hives and Itching  . Lipitor [Atorvastatin] Other (See Comments)    Reaction:  Muscle pain   . Morphine Hives and Itching  . Simvastatin Other (See Comments)    Reaction:  Muscle pain    Current Meds  Medication Sig  . aspirin EC 81 MG tablet Take 81 mg by mouth daily.  . benzonatate (TESSALON) 200 MG capsule Take 1 capsule (200 mg total) by mouth 3 (three) times daily as needed for cough.  . Cholecalciferol (VITAMIN D) 125 MCG (5000 UT) CAPS Take 1 capsule by mouth once a week.  . lovastatin (MEVACOR) 40 MG tablet Take 1 tablet (40 mg total) by mouth every Monday, Wednesday, and Friday.  . pantoprazole (PROTONIX) 40 MG tablet Take 40 mg by mouth as needed.  . ramipril (ALTACE) 5 MG capsule TAKE 1 CAPSULE BY MOUTH TWICE DAILY  . vitamin B-12 (CYANOCOBALAMIN) 1000 MCG tablet Take 1,000 mcg by mouth daily.   Past Medical History:  Diagnosis Date  . Acute duodenal ulcer with hemorrhage but without obstruction    required  bld. transfusion  . Anemia, iron deficiency   . Arthritis    OA- pt. reports its. minor  . CAD (coronary artery disease) 2004   a. s/p MI treated with drug-eluting stent placement to the proximal left anterior descending in late 2004  . CKD (chronic kidney disease), Sweeney III ([place])   . COVID-19 virus infection 10/2020  . Diverticulosis of colon   . Duodenal stricture - recurrent 09/21/2014  . 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 GERD (gastroesophageal reflux disease)   . H/O hiatal hernia   . Helicobacter pylori gastritis    recurrent  . Hypercholesteremia   . Hyperglycemia   . Hypertension   .  Myocardial infarct ([place])   . NAFLD (nonalcoholic fatty liver disease) 2015   with liver cysts  . Paralysis of upper limb ([facility])    R arm- birth injury  . Personal history of colonic polyps   . Small bowel obstruction ([place])   . Urticaria    Lat left arm   Past Surgical History:  Procedure Laterality Date  . ANTERIOR CERVICAL DECOMP/DISCECTOMY FUSION  03/2013   C3/4, C4/5 HNP (kritzer)  . ANTERIOR CERVICAL DECOMP/DISCECTOMY FUSION N/A 03/15/2013   Procedure: ANTERIOR CERVICAL DECOMPRESSION/DISCECTOMY FUSION 2 LEVELS;  Surgeon: Luis Ghee, MD;  Location: [facility];  Service: Neurosurgery;  Laterality: N/A;  Cervical three-four, cervical four-five anterior cervical diskectomy fusion with a trabecular metal plus plate   . CARDIAC CATHETERIZATION  2006   stent placed  . CARDIAC CATHETERIZATION N/A 07/08/2016   Procedure: Left Heart Cath and Coronary Angiography;  Surgeon: Luis Mocha, MD;  Location: [facility];  Service: Cardiovascular;  Laterality: N/A;  . COLONOSCOPY  multiple   diverticulosis, int hem, h/o adenomatous polyps Luis Sweeney)  . COLONOSCOPY  07/2014   severe diverticulosis, no rpt needed Luis Sweeney)  . CORONARY STENT PLACEMENT  2006   drug-eluting  . ESOPHAGOGASTRODUODENOSCOPY  11/26/10   mild gastritis, duodenitis, duodenal stricture (Dr. Carlean Sweeney)  . ESOPHAGOGASTRODUODENOSCOPY  10//2015   duodenal stricture, ?recurrent ulcer Luis Sweeney)  . HERNIA REPAIR Right 2006   Dr. Hassell Done  . KNEE SURGERY     left  . SHOULDER SURGERY     right  . spinal cyst removal    . WRIST SURGERY     Social History   Social History Narrative   Daily caffeine use 3 per day   Lives with wife    Activity: no regular exercise   Diet: tries to avoid sweets, some water, fruits/vegetables occasionally   family history includes Arthritis in his sister; Cancer in his sister; Diabetes in his father and sister; Heart disease in his father; Heart failure in his father; Hypertension in his  sister and sister; Kidney disease in his father; Ovarian cancer in an other family member.   Review of Systems As per HPI  Objective:   Physical Exam BP (!) 108/56 (BP Location: Left Arm, Patient Position: Sitting, Cuff Size: Normal)   Pulse 84   Ht 6\' 2"  (1.88 m)   Wt 238 lb 8 oz (108.2 kg)   BMI 30.62 kg/m    21 minutes total time on this visit before during and after

## 2021-01-15 NOTE — Patient Instructions (Signed)
Glad you are doing better.  As far as the stress test talk to the cardiologist about whether the new drugs don't make you sick.  I have printed a sheet about hernias.  I appreciate the opportunity to care for you. Gatha Mayer, MD, [facility]

## 2021-01-20 ENCOUNTER — Telehealth: Payer: Self-pay

## 2021-01-20 NOTE — Telephone Encounter (Signed)
Called and offered the patient several appointments for cardiac clearance.  He declined all offers, stating he will think about it and call back if he decides to have surgery.  He was grateful for follow-up.

## 2021-01-20 NOTE — Telephone Encounter (Signed)
-----   Message from Sherren Mocha, MD sent at 01/20/2021  2:33 PM EDT ----- Alycia Rossetti - could you offer him an appointment with me or one of our APP's for preop clearance if he would like to have his hernia repaired?  thx ----- Message ----- From: Gatha Mayer, MD Sent: 01/15/2021   1:29 PM EDT To: Erroll Luna, MD, Sherren Mocha, MD, #

## 2021-02-21 DIAGNOSIS — M546 Pain in thoracic spine: Secondary | ICD-10-CM | POA: Diagnosis not present

## 2021-02-25 ENCOUNTER — Other Ambulatory Visit: Payer: Self-pay | Admitting: Orthopedic Surgery

## 2021-02-25 DIAGNOSIS — I712 Thoracic aortic aneurysm, without rupture, unspecified: Secondary | ICD-10-CM

## 2021-03-13 ENCOUNTER — Ambulatory Visit
Admission: RE | Admit: 2021-03-13 | Discharge: 2021-03-13 | Disposition: A | Discharge status: Home / Self Care | Payer: PPO | Source: Ambulatory Visit | Attending: Orthopedic Surgery | Admitting: Orthopedic Surgery

## 2021-03-13 ENCOUNTER — Other Ambulatory Visit: Payer: Self-pay

## 2021-03-13 DIAGNOSIS — I712 Thoracic aortic aneurysm, without rupture, unspecified: Secondary | ICD-10-CM

## 2021-03-13 MED ORDER — IOPAMIDOL (ISOVUE-370) INJECTION 76%
75.0000 mL | Freq: Once | INTRAVENOUS | Status: AC | PRN
  Administered 2021-03-13: 50 mL via INTRAVENOUS

## 2021-03-17 ENCOUNTER — Other Ambulatory Visit: Payer: Self-pay | Admitting: Family Medicine

## 2021-03-20 ENCOUNTER — Ambulatory Visit (INDEPENDENT_AMBULATORY_CARE_PROVIDER_SITE_OTHER): Payer: PPO

## 2021-03-20 DIAGNOSIS — Z0000 Encounter for general adult medical examination without abnormal findings: Secondary | ICD-10-CM | POA: Diagnosis not present

## 2021-03-20 NOTE — Progress Notes (Signed)
Subjective:   Luis Sweeney is a 80 y.o. male who presents for Medicare Annual/Subsequent preventive examination.  Review of Systems: N/A     I connected with the patient today by telephone and verified that I am speaking with the correct person using two identifiers. Location patient: home Location nurse: work Persons participating in the telephone visit: patient, nurse.   I discussed the limitations, risks, security and privacy concerns of performing an evaluation and management service by telephone and the availability of in person appointments. I also discussed with the patient that there may be a patient responsible charge related to this service. The patient expressed understanding and verbally consented to this telephonic visit.        Cardiac Risk Factors include: advanced age (>55men, >28 women);male gender;diabetes mellitus;Other (see comment), Risk factor comments: hyperlipidemia     Objective:    Today's Vitals   There is no height or weight on file to calculate BMI.  Advanced Directives 03/20/2021 11/22/2020 11/21/2020 01/19/2020 07/07/2016 02/20/2016 09/11/2015  Does Patient Have a Medical Advance Directive? No No No No No No No  Would patient like information on creating a medical advance directive? No - Patient declined No - Patient declined No - Patient declined No - Patient declined Yes - Scientist, clinical (histocompatibility and immunogenetics) given Yes - Scientist, clinical (histocompatibility and immunogenetics) given No - patient declined information  Pre-existing out of facility DNR order (yellow form or pink MOST form) - - - - - - -    Current Medications (verified) Outpatient Encounter Medications as of 03/20/2021  Medication Sig   aspirin EC 81 MG tablet Take 81 mg by mouth daily.   benzonatate (TESSALON) 200 MG capsule Take 1 capsule (200 mg total) by mouth 3 (three) times daily as needed for cough.   Cholecalciferol (VITAMIN D) 125 MCG (5000 UT) CAPS Take 1 capsule by mouth once a week.   lovastatin (MEVACOR) 40 MG tablet TAKE 1  TABLET BY MOUTH EVERY MONDAY, WEDNESDAY, AND FRIDAY   pantoprazole (PROTONIX) 40 MG tablet Take 40 mg by mouth as needed.   ramipril (ALTACE) 5 MG capsule TAKE 1 CAPSULE BY MOUTH TWICE DAILY   vitamin B-12 (CYANOCOBALAMIN) 1000 MCG tablet Take 1,000 mcg by mouth daily.   No facility-administered encounter medications on file as of 03/20/2021.    Allergies (verified) Prednisone, Codeine, Lipitor [atorvastatin], Morphine, and Simvastatin   History: Past Medical History:  Diagnosis Date   Acute duodenal ulcer with hemorrhage but without obstruction    required  bld. transfusion   Anemia, iron deficiency    Arthritis    OA- pt. reports its. minor   CAD (coronary artery disease) 2004   a. s/p MI treated with drug-eluting stent placement to the proximal left anterior descending in late 2004   CKD (chronic kidney disease), stage III ([place])    COVID-19 virus infection 10/2020   Diverticulosis of colon    Duodenal stricture - recurrent 09/21/2014   Erb's palsy    birth trauma, pt. reports he was 11lbs. + at birth    GERD (gastroesophageal reflux disease)    H/O hiatal hernia    Helicobacter pylori gastritis    recurrent   Hypercholesteremia    Hyperglycemia    Hypertension    Myocardial infarct ([facility])    NAFLD (nonalcoholic fatty liver disease) 2015   with liver cysts   Paralysis of upper limb ([facility])    R arm- birth injury   Personal history of colonic polyps    Small bowel  obstruction ([place])    Urticaria    Lat left arm   Past Surgical History:  Procedure Laterality Date   ANTERIOR CERVICAL DECOMP/DISCECTOMY FUSION  03/2013   C3/4, C4/5 HNP (kritzer)   ANTERIOR CERVICAL DECOMP/DISCECTOMY FUSION N/A 03/15/2013   Procedure: ANTERIOR CERVICAL DECOMPRESSION/DISCECTOMY FUSION 2 LEVELS;  Surgeon: Faythe Ghee, MD;  Location: [facility];  Service: Neurosurgery;  Laterality: N/A;  Cervical three-four, cervical four-five anterior cervical diskectomy fusion with a trabecular metal plus  plate    CARDIAC CATHETERIZATION  2006   stent placed   CARDIAC CATHETERIZATION N/A 07/08/2016   Procedure: Left Heart Cath and Coronary Angiography;  Surgeon: Sherren Mocha, MD;  Location: [facility];  Service: Cardiovascular;  Laterality: N/A;   COLONOSCOPY  multiple   diverticulosis, int hem, h/o adenomatous polyps [name])   COLONOSCOPY  07/2014   severe diverticulosis, no rpt needed [name])   CORONARY STENT PLACEMENT  2006   drug-eluting   ESOPHAGOGASTRODUODENOSCOPY  11/26/10   mild gastritis, duodenitis, duodenal stricture (Dr. [name])   ESOPHAGOGASTRODUODENOSCOPY  10//2015   duodenal stricture, ?recurrent ulcer [name])   HERNIA REPAIR Right 2006   Dr. Hassell Done   KNEE SURGERY     left   SHOULDER SURGERY     right   spinal cyst removal     WRIST SURGERY     Family History  Problem Relation Age of Onset   Diabetes Father    Heart disease Father        CHF   Kidney disease Father        kidney failure   Heart failure Father    Diabetes Sister    Hypertension Sister    Cancer Sister        pelvic mass   Hypertension Sister    Arthritis Sister    Ovarian cancer Other    Social History   Socioeconomic History   Marital status: Married    Spouse name: Not on file   Number of children: 3   Years of education: Not on file   Highest education level: Not on file  Occupational History   Occupation: HT Warden/ranger: [employer]    Comment: metal Fabrication/Stairs/Rails commerial  Tobacco Use   Smoking status: Former    Packs/day: 1.50    Years: 3.00    Pack years: 4.50    Types: Cigarettes    Quit date: 02/16/1985    Years since quitting: 36.1   Smokeless tobacco: Never  Vaping Use   Vaping Use: Never used  Substance and Sexual Activity   Alcohol use: Yes    Alcohol/week: 0.0 standard drinks    Comment: two to three times a year   Drug use: No   Sexual activity: Never    Comment: last few years.  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   Social Determinants of Radio broadcast assistant Strain: Low Risk    Difficulty of Paying Living Expenses: Not hard at all  Food Insecurity: No Food Insecurity   Worried About Charity fundraiser in the Last Year: Never true   Ran  Out of Food in the Last Year: Never true  Transportation Needs: No Transportation Needs   Lack of Transportation (Medical): No   Lack of Transportation (Non-Medical): No  Physical Activity: Inactive   Days of Exercise per Week: 0 days   Minutes of Exercise per Session: 0 min  Stress: No Stress Concern Present   Feeling of Stress : Not at all  Social Connections: Not on file    Tobacco Counseling Counseling given: Not Answered   Clinical Intake:  Pre-visit preparation completed: Yes  Pain : 0-10 Pain Type: Chronic pain Pain Location: Back Pain Descriptors / Indicators: Aching Pain Onset: More than a month ago Pain Frequency: Intermittent     Nutritional Risks: None Diabetes: Yes CBG done?: No Did pt. bring in CBG monitor from home?: No  How often do you need to have someone help you when you read instructions, pamphlets, or other written materials from your doctor or pharmacy?: 1 - Never  Diabetic: Yes Nutrition Risk Assessment:  Has the patient had any N/V/D within the last 2 months?  No  Does the patient have any non-healing wounds?  No  Has the patient had any unintentional weight loss or weight gain?  No   Diabetes:  Is the patient diabetic?  Yes  If diabetic, was a CBG obtained today?  No  telephone visit  Did the patient bring in their glucometer from home?  No  telephone visit  How often do you monitor your CBG's? When needed.    Financial Strains and Diabetes Management:  Are you having any financial strains with the device, your supplies or your medication? No .  Does the patient want to be seen by Chronic Care Management for management of their diabetes?  No  Would the patient like to be referred to a Nutritionist or for Diabetic Management?  No   Diabetic Exams:  Diabetic Eye Exam: Overdue for diabetic eye exam. Pt has been advised about the importance in completing this exam. Patient advised to call and schedule an eye exam. Diabetic Foot Exam: Overdue, Pt has been advised about the importance in completing this exam.    Interpreter Needed?: No  Information entered by :: CJohnson, LPN   Activities of Daily Living In your present state of health, do you have any difficulty performing the following activities: 03/20/2021 11/22/2020  Hearing? N N  Vision? N N  Difficulty concentrating or making decisions? N N  Walking or climbing stairs? N N  Dressing or bathing? N N  Doing errands, shopping? N N  Preparing Food and eating ? N -  Using the Toilet? N -  In the past six months, have you accidently leaked urine? N -  Do you have problems with loss of bowel control? N -  Managing your Medications? N -  Managing your Finances? N -  Housekeeping or managing your Housekeeping? N -  Some recent data might be hidden    Patient Care Team: Ria Bush, MD as PCP - General (Family Medicine) Sherren Mocha, MD as PCP - Cardiology (Cardiology) Dingeldein, Remo Lipps, MD as Consulting Physician (Ophthalmology) Sherren Mocha, MD as Consulting Physician (Cardiology) Karie Chimera, MD as Consulting Physician (Neurosurgery)  Indicate any recent Medical Services you may have received from other than [facility] providers in the past year (date may be approximate).     Assessment:   This is a routine wellness examination for [place].  Hearing/Vision screen Vision Screening - Comments:: Patient gets annual eye  exams  Dietary issues and exercise activities discussed: Current Exercise Habits: The patient does not participate in regular exercise at present, Exercise limited by: None identified   Goals Addressed             This Visit's Progress    Patient Stated       03/20/2021, I will maintain and continue medications as prescribed.        Depression Screen PHQ 2/9 Scores 03/20/2021 01/19/2020 12/12/2018 03/03/2017 03/03/2017 02/20/2016 02/20/2015  PHQ - 2 Score 0 0 0 0 0 0 0  PHQ- 9 Score 0 0 - - - - -    Fall Risk Fall Risk  03/20/2021 01/19/2020 12/12/2018 03/03/2017 03/03/2017  Falls in the past year? 0 0 0 No No  Number falls in past yr: 0 0 - - -  Injury with Fall? 0 0 - - -  Risk for fall due to : Medication side effect Medication side effect - - -  Follow up Falls evaluation completed;Falls prevention discussed Falls evaluation completed;Falls prevention discussed - - -    FALL RISK PREVENTION PERTAINING TO THE HOME:  Any stairs in or around the home? Yes  If so, are there any without handrails? No  Home free of loose throw rugs in walkways, pet beds, electrical cords, etc? Yes  Adequate lighting in your home to reduce risk of falls? Yes   ASSISTIVE DEVICES UTILIZED TO PREVENT FALLS:  Life alert? No  Use of a cane, walker or w/c? No  Grab bars in the bathroom? No  Shower chair or bench in shower? No  Elevated toilet seat or a handicapped toilet? No   TIMED UP AND GO:  Was the test performed?  N/A telephone visit .    Cognitive Function: MMSE - Mini Mental State Exam 03/20/2021 01/19/2020 02/20/2016  Not completed: Refused - -  Orientation to time - 5 5  Orientation to Place - 5 5  Registration - 3 3  Attention/ Calculation - 5 0  Recall - 3 3  Language- name 2 objects - - 0  Language- repeat - 1 1  Language- follow 3 step command - - 3  Language- read & follow direction - - 0  Write a sentence - - 0  Copy design - - 0  Total score - - 20  Mini Cog  Mini-Cog screen was  not completed. Patient wanted to skip this. Maximum score is 22. A value of 0 denotes this part of the MMSE was not completed or the patient failed this part of the Mini-Cog screening.       Immunizations Immunization History  Administered Date(s) Administered   Influenza Whole 07/10/2008   Moderna Sars-Covid-2 Vaccination 12/11/2019, 01/08/2020   Pneumococcal Conjugate-13 02/20/2015   Pneumococcal Polysaccharide-23 10/12/2002, 07/10/2008   Td 11/13/2000, 05/31/2012    TDAP status: Up to date  Flu Vaccine status: due Fall 2022  Pneumococcal vaccine status: Up to date  Covid-19 vaccine status: Completed 2 vaccines. Booster declined   Qualifies for Shingles Vaccine? Yes   Zostavax completed No   Shingrix Completed?: No.    Education has been provided regarding the importance of this vaccine. Patient has been advised to call insurance company to determine out of pocket expense if they have not yet received this vaccine. Advised may also receive vaccine at local pharmacy or Health Dept. Verbalized acceptance and understanding.  Screening Tests Health Maintenance  Topic Date Due   Pneumococcal Vaccine 21-15 Years old (1 - PCV) Never done  Zoster Vaccines- Shingrix (1 of 2) Never done   OPHTHALMOLOGY EXAM  03/27/2010   FOOT EXAM  03/03/2018   COVID-19 Vaccine (3 - Booster for Moderna series) 06/09/2020   HEMOGLOBIN A1C  07/23/2020   INFLUENZA VACCINE  05/12/2021   TETANUS/TDAP  05/31/2022   PNA vac Low Risk Adult  Completed   HPV VACCINES  Aged Out    Health Maintenance  Health Maintenance Due  Topic Date Due   Pneumococcal Vaccine 57-70 Years old (1 - PCV) Never done   Zoster Vaccines- Shingrix (1 of 2) Never done   OPHTHALMOLOGY EXAM  03/27/2010   FOOT EXAM  03/03/2018   COVID-19 Vaccine (3 - Booster for Moderna series) 06/09/2020   HEMOGLOBIN A1C  07/23/2020    Colorectal cancer screening: No longer required.   Lung Cancer Screening: (Low Dose CT Chest recommended  if Age 57-80 years, 30 pack-year currently smoking OR have quit w/in 15years.) does not qualify.   Additional Screening:  Hepatitis C Screening: does not qualify; Completed N/A  Vision Screening: Recommended annual ophthalmology exams for early detection of glaucoma and other disorders of the eye. Is the patient up to date with their annual eye exam?  Yes  Who is the provider or what is the name of the office in which the patient attends annual eye exams? [facility] If pt is not established with a provider, would they like to be referred to a provider to establish care? No .   Dental Screening: Recommended annual dental exams for proper oral hygiene  Community Resource Referral / Chronic Care Management: CRR required this visit?  No   CCM required this visit?  No      Plan:     I have personally reviewed and noted the following in the patient's chart:   Medical and social history Use of alcohol, tobacco or illicit drugs  Current medications and supplements including opioid prescriptions. Patient is not currently taking opioid prescriptions. Functional ability and status Nutritional status Physical activity Advanced directives List of other physicians Hospitalizations, surgeries, and ER visits in previous 12 months Vitals Screenings to include cognitive, depression, and falls Referrals and appointments  In addition, I have reviewed and discussed with patient certain preventive protocols, quality metrics, and best practice recommendations. A written personalized care plan for preventive services as well as general preventive health recommendations were provided to patient.   Due to this being a telephonic visit, the after visit summary with patients personalized plan was offered to patient via office or my-chart.  Patient preferred to pick up at office at next visit or via mychart.   Andrez Grime, LPN   02/16/3219

## 2021-03-20 NOTE — Progress Notes (Signed)
PCP notes:  Health Maintenance: Shingrix- due Covid booster- declined   Abnormal Screenings: none   Patient concerns: none   Nurse concerns: none   Next PCP appt.: none

## 2021-03-20 NOTE — Patient Instructions (Signed)
Mr. Luis Sweeney , Thank you for taking time to come for your Medicare Wellness Visit. I appreciate your ongoing commitment to your health goals. Please review the following plan we discussed and let me know if I can assist you in the future.   Screening recommendations/referrals: Colonoscopy: no longer required  Recommended yearly ophthalmology/optometry visit for glaucoma screening and checkup Recommended yearly dental visit for hygiene and checkup  Vaccinations: Influenza vaccine: due fall 2022  Pneumococcal vaccine: Completed series Tdap vaccine: Up to date, completed 05/31/2012, due 05/2022 Shingles vaccine: due, check with your insurance regarding coverage if interested    Covid-19: 2 vaccines completed, booster declined   Advanced directives: Advance directive discussed with you today. Even though you declined this today please call our office should you change your mind and we can give you the proper paperwork for you to fill out.  Conditions/risks identified: diabetes, hyperlipidemia   Next appointment: Follow up in one year for your annual wellness visit.   Preventive Care 43 Years and Older, Male Preventive care refers to lifestyle choices and visits with your health care provider that can promote health and wellness. What does preventive care include? A yearly physical exam. This is also called an annual well check. Dental exams once or twice a year. Routine eye exams. Ask your health care provider how often you should have your eyes checked. Personal lifestyle choices, including: Daily care of your teeth and gums. Regular physical activity. Eating a healthy diet. Avoiding tobacco and drug use. Limiting alcohol use. Practicing safe sex. Taking low doses of aspirin every day. Taking vitamin and mineral supplements as recommended by your health care provider. What happens during an annual well check? The services and screenings done by your health care provider during your annual  well check will depend on your age, overall health, lifestyle risk factors, and family history of disease. Counseling  Your health care provider may ask you questions about your: Alcohol use. Tobacco use. Drug use. Emotional well-being. Home and relationship well-being. Sexual activity. Eating habits. History of falls. Memory and ability to understand (cognition). Work and work Statistician. Screening  You may have the following tests or measurements: Height, weight, and BMI. Blood pressure. Lipid and cholesterol levels. These may be checked every 5 years, or more frequently if you are over 28 years old. Skin check. Lung cancer screening. You may have this screening every year starting at age 74 if you have a 30-pack-year history of smoking and currently smoke or have quit within the past 15 years. Fecal occult blood test (FOBT) of the stool. You may have this test every year starting at age 90. Flexible sigmoidoscopy or colonoscopy. You may have a sigmoidoscopy every 5 years or a colonoscopy every 10 years starting at age 71. Prostate cancer screening. Recommendations will vary depending on your family history and other risks. Hepatitis C blood test. Hepatitis B blood test. Sexually transmitted disease (STD) testing. Diabetes screening. This is done by checking your blood sugar (glucose) after you have not eaten for a while (fasting). You may have this done every 1-3 years. Abdominal aortic aneurysm (AAA) screening. You may need this if you are a current or former smoker. Osteoporosis. You may be screened starting at age 35 if you are at high risk. Talk with your health care provider about your test results, treatment options, and if necessary, the need for more tests. Vaccines  Your health care provider may recommend certain vaccines, such as: Influenza vaccine. This is recommended every  year. Tetanus, diphtheria, and acellular pertussis (Tdap, Td) vaccine. You may need a Td booster  every 10 years. Zoster vaccine. You may need this after age 73. Pneumococcal 13-valent conjugate (PCV13) vaccine. One dose is recommended after age 27. Pneumococcal polysaccharide (PPSV23) vaccine. One dose is recommended after age 43. Talk to your health care provider about which screenings and vaccines you need and how often you need them. This information is not intended to replace advice given to you by your health care provider. Make sure you discuss any questions you have with your health care provider. Document Released: 10/25/2015 Document Revised: 06/17/2016 Document Reviewed: 07/30/2015 Elsevier Interactive Patient Education  2017 [employer] Prevention in the Home Falls can cause injuries. They can happen to people of all ages. There are many things you can do to make your home safe and to help prevent falls. What can I do on the outside of my home? Regularly fix the edges of walkways and driveways and fix any cracks. Remove anything that might make you trip as you walk through a door, such as a raised step or threshold. Trim any bushes or trees on the path to your home. Use bright outdoor lighting. Clear any walking paths of anything that might make someone trip, such as rocks or tools. Regularly check to see if handrails are loose or broken. Make sure that both sides of any steps have handrails. Any raised decks and porches should have guardrails on the edges. Have any leaves, snow, or ice cleared regularly. Use sand or salt on walking paths during winter. Clean up any spills in your garage right away. This includes oil or grease spills. What can I do in the bathroom? Use night lights. Install grab bars by the toilet and in the tub and shower. Do not use towel bars as grab bars. Use non-skid mats or decals in the tub or shower. If you need to sit down in the shower, use a plastic, non-slip stool. Keep the floor dry. Clean up any water that spills on the floor as soon  as it happens. Remove soap buildup in the tub or shower regularly. Attach bath mats securely with double-sided non-slip rug tape. Do not have throw rugs and other things on the floor that can make you trip. What can I do in the bedroom? Use night lights. Make sure that you have a light by your bed that is easy to reach. Do not use any sheets or blankets that are too big for your bed. They should not hang down onto the floor. Have a firm chair that has side arms. You can use this for support while you get dressed. Do not have throw rugs and other things on the floor that can make you trip. What can I do in the kitchen? Clean up any spills right away. Avoid walking on wet floors. Keep items that you use a lot in easy-to-reach places. If you need to reach something above you, use a strong step stool that has a grab bar. Keep electrical cords out of the way. Do not use floor polish or wax that makes floors slippery. If you must use wax, use non-skid floor wax. Do not have throw rugs and other things on the floor that can make you trip. What can I do with my stairs? Do not leave any items on the stairs. Make sure that there are handrails on both sides of the stairs and use them. Fix handrails that are broken or  loose. Make sure that handrails are as long as the stairways. Check any carpeting to make sure that it is firmly attached to the stairs. Fix any carpet that is loose or worn. Avoid having throw rugs at the top or bottom of the stairs. If you do have throw rugs, attach them to the floor with carpet tape. Make sure that you have a light switch at the top of the stairs and the bottom of the stairs. If you do not have them, ask someone to add them for you. What else can I do to help prevent falls? Wear shoes that: Do not have high heels. Have rubber bottoms. Are comfortable and fit you well. Are closed at the toe. Do not wear sandals. If you use a stepladder: Make sure that it is fully  opened. Do not climb a closed stepladder. Make sure that both sides of the stepladder are locked into place. Ask someone to hold it for you, if possible. Clearly mark and make sure that you can see: Any grab bars or handrails. First and last steps. Where the edge of each step is. Use tools that help you move around (mobility aids) if they are needed. These include: Canes. Walkers. Scooters. Crutches. Turn on the lights when you go into a dark area. Replace any light bulbs as soon as they burn out. Set up your furniture so you have a clear path. Avoid moving your furniture around. If any of your floors are uneven, fix them. If there are any pets around you, be aware of where they are. Review your medicines with your doctor. Some medicines can make you feel dizzy. This can increase your chance of falling. Ask your doctor what other things that you can do to help prevent falls. This information is not intended to replace advice given to you by your health care provider. Make sure you discuss any questions you have with your health care provider. Document Released: 07/25/2009 Document Revised: 03/05/2016 Document Reviewed: 11/02/2014 Elsevier Interactive Patient Education  2017 [employer].

## 2021-03-26 ENCOUNTER — Encounter: Payer: Self-pay | Admitting: Physician Assistant

## 2021-03-26 ENCOUNTER — Other Ambulatory Visit: Payer: Self-pay

## 2021-03-26 ENCOUNTER — Ambulatory Visit: Payer: PPO | Admitting: Physician Assistant

## 2021-03-26 VITALS — BP 126/66 | HR 64 | Ht 74.0 in | Wt 243.0 lb

## 2021-03-26 DIAGNOSIS — I10 Essential (primary) hypertension: Secondary | ICD-10-CM | POA: Diagnosis not present

## 2021-03-26 DIAGNOSIS — Z01818 Encounter for other preprocedural examination: Secondary | ICD-10-CM | POA: Diagnosis not present

## 2021-03-26 DIAGNOSIS — I2510 Atherosclerotic heart disease of native coronary artery without angina pectoris: Secondary | ICD-10-CM | POA: Diagnosis not present

## 2021-03-26 DIAGNOSIS — N1832 Chronic kidney disease, stage 3b: Secondary | ICD-10-CM

## 2021-03-26 DIAGNOSIS — E782 Mixed hyperlipidemia: Secondary | ICD-10-CM | POA: Diagnosis not present

## 2021-03-26 NOTE — Patient Instructions (Signed)
Medication Instructions:  Your physician recommends that you continue on your current medications as directed. Please refer to the Current Medication list given to you today.  *If you need a refill on your cardiac medications before your next appointment, please call your pharmacy*   Lab Work: -None  If you have labs (blood work) drawn today and your tests are completely normal, you will receive your results only by: [place] (if you have MyChart) OR A paper copy in the mail If you have any lab test that is abnormal or we need to change your treatment, we will call you to review the results.   Testing/Procedures:  Your physician has requested that you have a lexiscan myoview. For further information please visit [email or web address]. Please follow instruction sheet, as given. You are scheduled for a Myocardial Perfusion Imaging Study on Thursday, July 7 at 10:00 am.   Please arrive 15 minutes prior to your appointment time for registration and insurance purposes.   The test will take approximately 3 to 4 hours to complete; you may bring reading material. If someone comes with you to your appointment, they will need to remain in the main lobby due to limited space in the testing area.   How to prepare for your Myocardial Perfusion test:   Do not eat or drink 3 hours prior to your test, except you may have water.    Do not consume products containing caffeine (regular or decaffeinated) 12 hours prior to your test (ex: coffee, chocolate, soda, tea)   Do bring a list of your current medications with you. If not listed below, you may take your medications as normal.   Bring any held medication to your appointment, as you may be required to take it once the test is complete.   Do wear comfortable clothes (no dresses or overalls) and walking shoes. Tennis shoes are preferred. No heels or open toed shoes.  Do not wear cologne, aftershave or lotions (deodorant is allowed).   If  these instructions are not followed, you test will have to be rescheduled.   Please report to [address] for your test. If you have questions or concerns about your appointment, please call the Nuclear Lab at [phone].  If you cannot keep your appointment, please provide 24 hour notification to the Nuclear lab to avoid a possible $50 charge to your account.      Follow-Up: At [facility], you and your health needs are our priority.  As part of our continuing mission to provide you with exceptional heart care, we have created designated Provider Care Teams.  These Care Teams include your primary Cardiologist (physician) and Advanced Practice Providers (APPs -  Physician Assistants and Nurse Practitioners) who all work together to provide you with the care you need, when you need it.  We recommend signing up for the patient portal called "MyChart".  Sign up information is provided on this After Visit Summary.  MyChart is used to connect with patients for Virtual Visits (Telemedicine).  Patients are able to view lab/test results, encounter notes, upcoming appointments, etc.  Non-urgent messages can be sent to your provider as well.   To learn more about what you can do with MyChart, go to [email or web address].

## 2021-03-26 NOTE — Progress Notes (Addendum)
Cardiology Office Note:    Date:  03/26/2021   ID:  Luis Sweeney, DOB 03-17-1941, MRN [id number]  PCP:  Ria Bush, MD   [facility] Providers Cardiologist:  Sherren Mocha, MD Cardiology APP:  Sharmon [place]     Referring MD: Ria Bush, MD   Chief Complaint:  Surgical Clearance    Patient Profile:    Luis Sweeney is a 80 y.o. male with:  Coronary artery disease S/p DES to LAD in 2004 Hypertension Chronic kidney disease NASH Hyperlipidemia PUD  Prior CV studies: Cardiac catheterization 08-05-16 LAD prox stent patent with 30 ISR; D1 ost 90 (small jailed) OM1 60 RCA normal  Myoview 03/27/2009 EF 46, no ischemia   History of Present Illness: Luis Sweeney was here today with his wife for her appt.  He noted that he has an inguinal hernia and needs surgery (Dr. Brantley Stage).  He saw Leanor Kail, PA-C in 10/21 and a stress test was recommended.  He had a bad experience with stress testing in the past and decided not to pursue this.  He would now like to pursue clearance for surgery again.  I added him on to my schedule today.  He is fairly sedentary because of back issues.  He has back pain at times that radiates to his chest. He has not had shortness of breath, syncope, leg edema.          Past Medical History:  Diagnosis Date   Acute duodenal ulcer with hemorrhage but without obstruction    required  bld. transfusion   Anemia, iron deficiency    Arthritis    OA- pt. reports its. minor   CAD (coronary artery disease) 2004   a. s/p MI treated with drug-eluting stent placement to the proximal left anterior descending in late 2004   CKD (chronic kidney disease), stage III ([place])    COVID-19 virus infection 10/2020   Diverticulosis of colon    Duodenal stricture - recurrent 09/21/2014   Erb's palsy    birth trauma, pt. reports he was 11lbs. + at birth    GERD (gastroesophageal reflux disease)    H/O hiatal hernia    Helicobacter pylori gastritis     recurrent   Hypercholesteremia    Hyperglycemia    Hypertension    Myocardial infarct (HCC)    NAFLD (nonalcoholic fatty liver disease) 2015   with liver cysts   Paralysis of upper limb (HCC)    R arm- birth injury   Personal history of colonic polyps    Small bowel obstruction (HCC)    Urticaria    Lat left arm    Current Medications: Current Meds  Medication Sig   aspirin EC 81 MG tablet Take 81 mg by mouth daily.   benzonatate (TESSALON) 200 MG capsule Take 1 capsule (200 mg total) by mouth 3 (three) times daily as needed for cough.   Cholecalciferol (VITAMIN D) 125 MCG (5000 UT) CAPS Take 1 capsule by mouth once a week.   lovastatin (MEVACOR) 40 MG tablet TAKE 1 TABLET BY MOUTH EVERY MONDAY, WEDNESDAY, AND FRIDAY   pantoprazole (PROTONIX) 40 MG tablet Take 40 mg by mouth as needed.   ramipril (ALTACE) 5 MG capsule TAKE 1 CAPSULE BY MOUTH TWICE DAILY   vitamin B-12 (CYANOCOBALAMIN) 1000 MCG tablet Take 1,000 mcg by mouth daily.     Allergies:   Prednisone, Codeine, Lipitor [atorvastatin], Morphine, and Simvastatin   Social History   Tobacco Use   Smoking status:  Former    Packs/day: 1.50    Years: 3.00    Pack years: 4.50    Types: Cigarettes    Quit date: 02/16/1985    Years since quitting: 36.1   Smokeless tobacco: Never  Vaping Use   Vaping Use: Never used  Substance Use Topics   Alcohol use: Yes    Alcohol/week: 0.0 standard drinks    Comment: two to three times a year   Drug use: No     Family Hx: The patient's family history includes Arthritis in his sister; Cancer in his sister; Diabetes in his father and sister; Heart disease in his father; Heart failure in his father; Hypertension in his sister and sister; Kidney disease in his father; Ovarian cancer in an other family member.  ROS See HPI  EKGs/Labs/Other Test Reviewed:    EKG:  EKG is   ordered today.  The ekg ordered today demonstrates NSR, HR 65, normal axis, septal Q waves, no ST-T wave  changes, QTC 407, similar to prior tracings  Recent Labs: 11/22/2020: ALT 13 11/23/2020: Magnesium 1.8 12/13/2020: BUN 25; Creatinine, Ser 1.72; Hemoglobin 13.2; Platelets 237.0; Potassium 4.6; Sodium 139   Recent Lipid Panel Lab Results  Component Value Date/Time   CHOL 115 01/22/2020 09:30 AM   CHOL 111 09/29/2018 10:21 AM   TRIG 35.0 01/22/2020 09:30 AM   HDL 37.50 (L) 01/22/2020 09:30 AM   HDL 39 (L) 09/29/2018 10:21 AM   LDLCALC 70 01/22/2020 09:30 AM   LDLCALC 62 09/29/2018 10:21 AM      Risk Assessment/Calculations:      Physical Exam:    VS:  BP 126/66   Pulse 64   Ht 6\' 2"  (1.88 m)   Wt 243 lb (110.2 kg)   SpO2 98% Comment: at rest  BMI 31.20 kg/m     Wt Readings from Last 3 Encounters:  03/26/21 243 lb (110.2 kg)  01/15/21 238 lb 8 oz (108.2 kg)  12/13/20 245 lb (111.1 kg)     Constitutional:      Appearance: Healthy appearance. Not in distress.  Pulmonary:     Effort: Pulmonary effort is normal.     Breath sounds: No wheezing. No rales.  Cardiovascular:     Normal rate. Regular rhythm. Normal S1. Normal S2.      Murmurs: There is no murmur.  Edema:    Peripheral edema absent.  Abdominal:     Palpations: Abdomen is soft.  Musculoskeletal:     Cervical back: Neck supple. Skin:    General: Skin is warm and dry.  Neurological:     General: No focal deficit present.     Mental Status: Alert and oriented to person, place and time.     Cranial Nerves: Cranial nerves are intact.        ASSESSMENT & PLAN:    1. Preoperative clearance His risk of perioperative major cardiac event is low according to the revised cardiac risk index (RCRI) at 0.9%.  However, he has occasional chest pain that seems to be related to his back problem.  He also is fairly sedentary and cannot achieve 4 METS.  I agree that he needs stress testing to further risk stratify him for surgery.  As long as this is low risk, he can proceed at acceptable risk.  If aspirin needs to be held  due to significant bleeding risk, this can be stopped 5 to 7 days prior and resume postop as soon as it is felt to be  safe.  2. Coronary artery disease involving native coronary artery of native heart without angina pectoris History of DES to the LAD in 2004.  Myoview in 2010 was low risk.  Cardiac catheterization in 2017 demonstrated moderate disease in OM, patent proximal LAD stent and a jailed diagonal.  He has occasional chest pain that seems to be related to his back issue.  He is not really having clear anginal symptoms but will undergo stress testing as outlined.  Continue aspirin, statin.  3. Essential hypertension The patient's blood pressure is controlled on his current regimen.  Continue current therapy.   4. Stage 3b chronic kidney disease ([place]) Most recent creatinine 1.72.  5. Mixed hyperlipidemia He has difficulty tolerating statins.  Most recent LDL was close to goal.  Continue current therapy.     Shared Decision Making/Informed Consent The risks [chest pain, shortness of breath, cardiac arrhythmias, dizziness, blood pressure fluctuations, myocardial infarction, stroke/transient ischemic attack, nausea, vomiting, allergic reaction, radiation exposure, metallic taste sensation and life-threatening complications (estimated to be 1 in 10,000)], benefits (risk stratification, diagnosing coronary artery disease, treatment guidance) and alternatives of a nuclear stress test were discussed in detail with Luis Sweeney and he agrees to proceed.    Dispo:  Return for Scheduled Follow Up.   Medication Adjustments/Labs and Tests Ordered: Current medicines are reviewed at length with the patient today.  Concerns regarding medicines are outlined above.  Tests Ordered: Orders Placed This Encounter  Procedures   Cardiac Stress Test: Informed Consent Details: Physician/Practitioner Attestation; Transcribe to consent form and obtain patient signature   Myocardial Perfusion Imaging   EKG  12-Lead   Medication Changes: No orders of the defined types were placed in this encounter.   Signed, Richardson Dopp, PA-C  03/26/2021 5:33 PM    [facility] [place], [place],   [address] Phone: [phone]; Fax: [phone]   Addendum Myoview 04/17/21 1.  Fixed apical inferior perfusion defect with wall motion abnormality in this area consistent with prior infarct 2.  Mild systolic dysfunction (EF 10%) 3.  Intermediate risk study due to systolic dysfunction.  No ischemia.  Echocardiogram 05/06/21 EF 45-50, mild LVH, Gr 1 DD, inf and apical HK, normal RVSF, trivial MR, dilated ascending aorta (38 mm)  Recommendations Findings on Myoview and Echocardiogram are consistent with prior history.  Case discussed with Dr. Burt Knack.  Given findings on above testing, the patient may proceed with planned surgery at acceptable risk.  Richardson Dopp, PA-C    05/09/2021 9:15 AM

## 2021-03-27 DIAGNOSIS — M546 Pain in thoracic spine: Secondary | ICD-10-CM | POA: Diagnosis not present

## 2021-04-16 ENCOUNTER — Telehealth: Payer: Self-pay

## 2021-04-16 NOTE — Telephone Encounter (Signed)
Attempted to contact there patient, without success. It stated that he was unavailable. Will try again later. S.Cumi [place]in EMTP

## 2021-04-17 ENCOUNTER — Ambulatory Visit (HOSPITAL_COMMUNITY): Payer: PPO | Attending: Cardiology

## 2021-04-17 ENCOUNTER — Other Ambulatory Visit: Payer: Self-pay

## 2021-04-17 DIAGNOSIS — Z01810 Encounter for preprocedural cardiovascular examination: Secondary | ICD-10-CM

## 2021-04-17 DIAGNOSIS — Z01818 Encounter for other preprocedural examination: Secondary | ICD-10-CM

## 2021-04-17 LAB — MYOCARDIAL PERFUSION IMAGING
LV dias vol: 85 mL (ref 62–150)
LV sys vol: 46 mL
Peak HR: 91 {beats}/min
Rest HR: 67 {beats}/min
SDS: 4
SRS: 0
SSS: 4
TID: 1.01

## 2021-04-17 MED ORDER — TECHNETIUM TC 99M TETROFOSMIN IV KIT
30.7000 | PACK | Freq: Once | INTRAVENOUS | Status: AC | PRN
  Administered 2021-04-17: 30.7 via INTRAVENOUS
  Filled 2021-04-17: qty 31

## 2021-04-17 MED ORDER — TECHNETIUM TC 99M TETROFOSMIN IV KIT
11.0000 | PACK | Freq: Once | INTRAVENOUS | Status: AC | PRN
  Administered 2021-04-17: 11 via INTRAVENOUS
  Filled 2021-04-17: qty 11

## 2021-04-17 MED ORDER — REGADENOSON 0.4 MG/5ML IV SOLN
0.4000 mg | Freq: Once | INTRAVENOUS | Status: AC
  Administered 2021-04-17: 0.4 mg via INTRAVENOUS

## 2021-04-18 ENCOUNTER — Telehealth: Payer: Self-pay | Admitting: *Deleted

## 2021-04-18 DIAGNOSIS — I2510 Atherosclerotic heart disease of native coronary artery without angina pectoris: Secondary | ICD-10-CM

## 2021-04-18 DIAGNOSIS — Z01818 Encounter for other preprocedural examination: Secondary | ICD-10-CM

## 2021-04-18 NOTE — Telephone Encounter (Signed)
-----   Message from Liliane Shi, [place] sent at 04/18/2021  7:43 AM EDT ----- Stress test with evidence of old heart attack. EF is lower than previous stress test 12 years ago. He needs an echocardiogram to confirm his EF.  PLAN: Schedule echocardiogram  Richardson Dopp, PA-C 04/18/2021 07:43

## 2021-04-18 NOTE — Telephone Encounter (Signed)
Spoke with pt and made him aware of results and recommendations.  Pt agreeable to plan.  Advised I will place order for echo and scheduler will contact him to get it scheduled.  Pt appreciative for call.

## 2021-05-01 DIAGNOSIS — Z6831 Body mass index (BMI) 31.0-31.9, adult: Secondary | ICD-10-CM | POA: Diagnosis not present

## 2021-05-01 DIAGNOSIS — M5416 Radiculopathy, lumbar region: Secondary | ICD-10-CM | POA: Diagnosis not present

## 2021-05-01 DIAGNOSIS — I10 Essential (primary) hypertension: Secondary | ICD-10-CM | POA: Diagnosis not present

## 2021-05-03 ENCOUNTER — Other Ambulatory Visit: Payer: Self-pay | Admitting: Student

## 2021-05-03 DIAGNOSIS — M5416 Radiculopathy, lumbar region: Secondary | ICD-10-CM

## 2021-05-06 ENCOUNTER — Other Ambulatory Visit: Payer: Self-pay

## 2021-05-06 ENCOUNTER — Ambulatory Visit (HOSPITAL_COMMUNITY): Payer: PPO | Attending: Internal Medicine

## 2021-05-06 DIAGNOSIS — Z01818 Encounter for other preprocedural examination: Secondary | ICD-10-CM | POA: Insufficient documentation

## 2021-05-06 DIAGNOSIS — I2510 Atherosclerotic heart disease of native coronary artery without angina pectoris: Secondary | ICD-10-CM | POA: Insufficient documentation

## 2021-05-06 DIAGNOSIS — Z01810 Encounter for preprocedural cardiovascular examination: Secondary | ICD-10-CM

## 2021-05-06 LAB — ECHOCARDIOGRAM COMPLETE
Area-P 1/2: 2.76 cm2
S' Lateral: 4 cm

## 2021-05-09 ENCOUNTER — Encounter: Payer: Self-pay | Admitting: Physician Assistant

## 2021-05-09 ENCOUNTER — Other Ambulatory Visit: Payer: Self-pay | Admitting: *Deleted

## 2021-05-09 DIAGNOSIS — I2510 Atherosclerotic heart disease of native coronary artery without angina pectoris: Secondary | ICD-10-CM

## 2021-05-09 DIAGNOSIS — I25110 Atherosclerotic heart disease of native coronary artery with unstable angina pectoris: Secondary | ICD-10-CM

## 2021-05-10 ENCOUNTER — Ambulatory Visit
Admission: RE | Admit: 2021-05-10 | Discharge: 2021-05-10 | Disposition: A | Discharge status: Home / Self Care | Payer: PPO | Source: Ambulatory Visit | Attending: Student | Admitting: Student

## 2021-05-10 DIAGNOSIS — M48061 Spinal stenosis, lumbar region without neurogenic claudication: Secondary | ICD-10-CM | POA: Diagnosis not present

## 2021-05-10 DIAGNOSIS — M545 Low back pain, unspecified: Secondary | ICD-10-CM | POA: Diagnosis not present

## 2021-05-10 DIAGNOSIS — M5416 Radiculopathy, lumbar region: Secondary | ICD-10-CM

## 2021-05-11 ENCOUNTER — Encounter: Payer: Self-pay | Admitting: Family Medicine

## 2021-05-11 DIAGNOSIS — I509 Heart failure, unspecified: Secondary | ICD-10-CM | POA: Insufficient documentation

## 2021-05-11 DIAGNOSIS — I77810 Thoracic aortic ectasia: Secondary | ICD-10-CM | POA: Insufficient documentation

## 2021-05-14 DIAGNOSIS — M5416 Radiculopathy, lumbar region: Secondary | ICD-10-CM | POA: Diagnosis not present

## 2021-05-26 DIAGNOSIS — M4725 Other spondylosis with radiculopathy, thoracolumbar region: Secondary | ICD-10-CM | POA: Diagnosis not present

## 2021-05-28 ENCOUNTER — Encounter: Payer: Self-pay | Admitting: *Deleted

## 2021-05-28 ENCOUNTER — Other Ambulatory Visit: Payer: Self-pay

## 2021-05-28 DIAGNOSIS — K4030 Unilateral inguinal hernia, with obstruction, without gangrene, not specified as recurrent: Secondary | ICD-10-CM | POA: Diagnosis not present

## 2021-05-28 DIAGNOSIS — Z888 Allergy status to other drugs, medicaments and biological substances status: Secondary | ICD-10-CM

## 2021-05-28 DIAGNOSIS — K56609 Unspecified intestinal obstruction, unspecified as to partial versus complete obstruction: Secondary | ICD-10-CM | POA: Diagnosis not present

## 2021-05-28 DIAGNOSIS — R1031 Right lower quadrant pain: Secondary | ICD-10-CM | POA: Diagnosis present

## 2021-05-28 DIAGNOSIS — K760 Fatty (change of) liver, not elsewhere classified: Secondary | ICD-10-CM | POA: Diagnosis present

## 2021-05-28 DIAGNOSIS — Z841 Family history of disorders of kidney and ureter: Secondary | ICD-10-CM

## 2021-05-28 DIAGNOSIS — Z8249 Family history of ischemic heart disease and other diseases of the circulatory system: Secondary | ICD-10-CM

## 2021-05-28 DIAGNOSIS — Z885 Allergy status to narcotic agent status: Secondary | ICD-10-CM | POA: Diagnosis not present

## 2021-05-28 DIAGNOSIS — I252 Old myocardial infarction: Secondary | ICD-10-CM

## 2021-05-28 DIAGNOSIS — Z20822 Contact with and (suspected) exposure to covid-19: Secondary | ICD-10-CM | POA: Diagnosis present

## 2021-05-28 DIAGNOSIS — M4725 Other spondylosis with radiculopathy, thoracolumbar region: Secondary | ICD-10-CM | POA: Diagnosis not present

## 2021-05-28 DIAGNOSIS — K5730 Diverticulosis of large intestine without perforation or abscess without bleeding: Secondary | ICD-10-CM | POA: Diagnosis present

## 2021-05-28 DIAGNOSIS — K4090 Unilateral inguinal hernia, without obstruction or gangrene, not specified as recurrent: Secondary | ICD-10-CM | POA: Diagnosis not present

## 2021-05-28 DIAGNOSIS — K219 Gastro-esophageal reflux disease without esophagitis: Secondary | ICD-10-CM | POA: Diagnosis present

## 2021-05-28 DIAGNOSIS — I2510 Atherosclerotic heart disease of native coronary artery without angina pectoris: Secondary | ICD-10-CM | POA: Diagnosis present

## 2021-05-28 DIAGNOSIS — I700 Atherosclerosis of aorta: Secondary | ICD-10-CM | POA: Diagnosis present

## 2021-05-28 DIAGNOSIS — Z87891 Personal history of nicotine dependence: Secondary | ICD-10-CM | POA: Diagnosis not present

## 2021-05-28 DIAGNOSIS — N1832 Chronic kidney disease, stage 3b: Secondary | ICD-10-CM | POA: Diagnosis present

## 2021-05-28 DIAGNOSIS — Z7982 Long term (current) use of aspirin: Secondary | ICD-10-CM | POA: Diagnosis not present

## 2021-05-28 DIAGNOSIS — I129 Hypertensive chronic kidney disease with stage 1 through stage 4 chronic kidney disease, or unspecified chronic kidney disease: Secondary | ICD-10-CM | POA: Diagnosis present

## 2021-05-28 DIAGNOSIS — K4031 Unilateral inguinal hernia, with obstruction, without gangrene, recurrent: Secondary | ICD-10-CM | POA: Diagnosis present

## 2021-05-28 DIAGNOSIS — E7800 Pure hypercholesterolemia, unspecified: Secondary | ICD-10-CM | POA: Diagnosis present

## 2021-05-28 DIAGNOSIS — E785 Hyperlipidemia, unspecified: Secondary | ICD-10-CM | POA: Diagnosis not present

## 2021-05-28 DIAGNOSIS — Z79899 Other long term (current) drug therapy: Secondary | ICD-10-CM | POA: Diagnosis not present

## 2021-05-28 DIAGNOSIS — Z8719 Personal history of other diseases of the digestive system: Secondary | ICD-10-CM | POA: Diagnosis not present

## 2021-05-28 DIAGNOSIS — K6389 Other specified diseases of intestine: Secondary | ICD-10-CM | POA: Diagnosis not present

## 2021-05-28 DIAGNOSIS — Z8616 Personal history of COVID-19: Secondary | ICD-10-CM

## 2021-05-28 LAB — CBC
HCT: 42.8 % (ref 39.0–52.0)
Hemoglobin: 14.7 g/dL (ref 13.0–17.0)
MCH: 32.4 pg (ref 26.0–34.0)
MCHC: 34.3 g/dL (ref 30.0–36.0)
MCV: 94.3 fL (ref 80.0–100.0)
Platelets: 217 10*3/uL (ref 150–400)
RBC: 4.54 MIL/uL (ref 4.22–5.81)
RDW: 13.3 % (ref 11.5–15.5)
WBC: 8.3 10*3/uL (ref 4.0–10.5)
nRBC: 0 % (ref 0.0–0.2)

## 2021-05-28 MED ORDER — FENTANYL CITRATE (PF) 100 MCG/2ML IJ SOLN
50.0000 ug | Freq: Once | INTRAMUSCULAR | Status: AC
Start: 2021-05-29 — End: 2021-05-28
  Administered 2021-05-28: 50 ug via INTRAVENOUS
  Filled 2021-05-28: qty 2

## 2021-05-28 MED ORDER — ONDANSETRON HCL 4 MG/2ML IJ SOLN
4.0000 mg | Freq: Once | INTRAMUSCULAR | Status: AC
  Administered 2021-05-28: 4 mg via INTRAVENOUS
  Filled 2021-05-28: qty 2

## 2021-05-28 NOTE — ED Notes (Signed)
Patient aware that we need urine sample for testing, unable at this time. Pt given instruction on providing urine sample when able to do so.   

## 2021-05-28 NOTE — ED Provider Notes (Signed)
HPI: Pt is a 80 y.o. male who presents with complaints of abdominal pain  The patient p/w  acute onset RLQ abdominal pain 1.5 hours ago.   ROS: Denies fever, chest pain, vomiting  Past Medical History:  Diagnosis Date   Acute duodenal ulcer with hemorrhage but without obstruction    required  bld. transfusion   Anemia, iron deficiency    Arthritis    OA- pt. reports its. minor   CAD (coronary artery disease) 2004   a. s/p MI treated with drug-eluting stent placement to the proximal left anterior descending in late 2004 // Echocardiogram 7/22: EF 45-50, mild LVH, Gr 1 DD, inf and apical HK, normal RVSF, trivial MR, dilated ascending aorta (38 mm)   CKD (chronic kidney disease), stage III ([place])    COVID-19 virus infection 10/2020   Diverticulosis of colon    Duodenal stricture - recurrent 09/21/2014   Erb's palsy    birth trauma, pt. reports he was 11lbs. + at birth    GERD (gastroesophageal reflux disease)    H/O hiatal hernia    Helicobacter pylori gastritis    recurrent   Hypercholesteremia    Hyperglycemia    Hypertension    Myocardial infarct (HCC)    NAFLD (nonalcoholic fatty liver disease) 2015   with liver cysts   Paralysis of upper limb (HCC)    R arm- birth injury   Personal history of colonic polyps    Small bowel obstruction (HCC)    Urticaria    Lat left arm   Vitals:   05/28/21 2342 05/28/21 2345  BP:  (!) 165/78  Pulse:  76  Resp:  (!) 22  Temp: 97.8 F (36.6 C)   SpO2:  98%    Focused Physical Exam: Gen: No acute distress Head: atraumatic, normocephalic Eyes: Extraocular movements grossly intact; conjunctiva clear CV: RRR Lung: No increased WOB, no stridor GI: ND, no obvious masses, tender RLQ  Neuro: Alert and awake  Medical Decision Making and Plan: Given the patient's initial medical screening exam, the following diagnostic evaluation has been ordered. The patient will be placed in the appropriate treatment space, once one is available, to  complete the evaluation and treatment. I have discussed the plan of care with the patient and I have advised the patient that an ED physician or mid-level practitioner will reevaluate their condition after the test results have been received, as the results may give them additional insight into the type of treatment they may need.   Diagnostics: CT  Treatments: fent    Vanessa Potosi, MD 05/28/21 2352

## 2021-05-28 NOTE — ED Triage Notes (Addendum)
Pt reports right sided flank/abdominal pain, goes into the groin area. Nausea. Feels like he has to urinate, but unable. Hx of kidney stones, but says this does not feel like one. Also has hernia, and says he went to PT today.

## 2021-05-29 ENCOUNTER — Observation Stay: Payer: PPO | Admitting: Anesthesiology

## 2021-05-29 ENCOUNTER — Emergency Department: Payer: PPO

## 2021-05-29 ENCOUNTER — Inpatient Hospital Stay
Admission: EM | Admit: 2021-05-29 | Discharge: 2021-05-31 | DRG: 352 | Disposition: A | Discharge status: Home / Self Care | Payer: PPO | Attending: General Surgery | Admitting: General Surgery

## 2021-05-29 ENCOUNTER — Encounter: Payer: Self-pay | Admitting: General Surgery

## 2021-05-29 ENCOUNTER — Encounter
Admission: EM | Disposition: A | Discharge status: Home / Self Care | Payer: Self-pay | Source: Home / Self Care | Attending: General Surgery

## 2021-05-29 DIAGNOSIS — K4030 Unilateral inguinal hernia, with obstruction, without gangrene, not specified as recurrent: Principal | ICD-10-CM

## 2021-05-29 HISTORY — PX: XI ROBOTIC ASSISTED INGUINAL HERNIA REPAIR WITH MESH: SHX6706

## 2021-05-29 LAB — BASIC METABOLIC PANEL
Anion gap: 10 (ref 5–15)
BUN: 29 mg/dL — ABNORMAL HIGH (ref 8–23)
CO2: 23 mmol/L (ref 22–32)
Calcium: 9.4 mg/dL (ref 8.9–10.3)
Chloride: 102 mmol/L (ref 98–111)
Creatinine, Ser: 2.14 mg/dL — ABNORMAL HIGH (ref 0.61–1.24)
GFR, Estimated: 31 mL/min — ABNORMAL LOW (ref >60)
Glucose, Bld: 158 mg/dL — ABNORMAL HIGH (ref 70–99)
Potassium: 4.5 mmol/L (ref 3.5–5.1)
Sodium: 135 mmol/L (ref 135–145)

## 2021-05-29 LAB — HEPATIC FUNCTION PANEL
ALT: 16 U/L (ref 0–44)
AST: 19 U/L (ref 15–41)
Albumin: 4.1 g/dL (ref 3.5–5.0)
Alkaline Phosphatase: 35 U/L — ABNORMAL LOW (ref 38–126)
Bilirubin, Direct: 0.1 mg/dL (ref 0.0–0.2)
Indirect Bilirubin: 0.4 mg/dL (ref 0.3–0.9)
Total Bilirubin: 0.5 mg/dL (ref 0.3–1.2)
Total Protein: 7.2 g/dL (ref 6.5–8.1)

## 2021-05-29 LAB — RESP PANEL BY RT-PCR (FLU A&B, COVID) ARPGX2
Influenza A by PCR: NEGATIVE
Influenza B by PCR: NEGATIVE
SARS Coronavirus 2 by RT PCR: NEGATIVE

## 2021-05-29 LAB — LIPASE, BLOOD: Lipase: 34 U/L (ref 11–51)

## 2021-05-29 SURGERY — REPAIR, HERNIA, INGUINAL, ROBOT-ASSISTED, LAPAROSCOPIC, USING MESH
Anesthesia: General | Site: Inguinal | Laterality: Right

## 2021-05-29 MED ORDER — ONDANSETRON 4 MG PO TBDP
4.0000 mg | ORAL_TABLET | Freq: Four times a day (QID) | ORAL | Status: DC | PRN
  Administered 2021-05-31: 4 mg via ORAL
  Filled 2021-05-29 (×2): qty 1

## 2021-05-29 MED ORDER — CEFAZOLIN SODIUM-DEXTROSE 2-3 GM-%(50ML) IV SOLR
INTRAVENOUS | Status: DC | PRN
  Administered 2021-05-29: 2 g via INTRAVENOUS

## 2021-05-29 MED ORDER — ONDANSETRON HCL 4 MG/2ML IJ SOLN
INTRAMUSCULAR | Status: AC
  Filled 2021-05-29: qty 2

## 2021-05-29 MED ORDER — RAMIPRIL 5 MG PO CAPS
5.0000 mg | ORAL_CAPSULE | Freq: Two times a day (BID) | ORAL | Status: DC
  Administered 2021-05-30 – 2021-05-31 (×3): 5 mg via ORAL
  Filled 2021-05-29 (×4): qty 1

## 2021-05-29 MED ORDER — ONDANSETRON HCL 4 MG/2ML IJ SOLN: 4.0000 mg | Freq: Once | INTRAMUSCULAR | Status: DC | PRN

## 2021-05-29 MED ORDER — ACETAMINOPHEN 325 MG PO TABS: 650.0000 mg | ORAL_TABLET | Freq: Four times a day (QID) | ORAL | Status: DC | PRN

## 2021-05-29 MED ORDER — LABETALOL HCL 5 MG/ML IV SOLN
INTRAVENOUS | Status: DC | PRN
  Administered 2021-05-29: 10 mg via INTRAVENOUS

## 2021-05-29 MED ORDER — PIPERACILLIN-TAZOBACTAM 3.375 G IVPB
3.3750 g | Freq: Three times a day (TID) | INTRAVENOUS | Status: DC
  Administered 2021-05-29 – 2021-05-31 (×5): 3.375 g via INTRAVENOUS
  Filled 2021-05-29 (×5): qty 50

## 2021-05-29 MED ORDER — FENTANYL CITRATE (PF) 100 MCG/2ML IJ SOLN
INTRAMUSCULAR | Status: AC
  Filled 2021-05-29: qty 2

## 2021-05-29 MED ORDER — ACETAMINOPHEN 10 MG/ML IV SOLN
INTRAVENOUS | Status: DC | PRN
  Administered 2021-05-29: 1000 mg via INTRAVENOUS

## 2021-05-29 MED ORDER — SODIUM CHLORIDE 0.9 % IV SOLN: INTRAVENOUS | Status: DC

## 2021-05-29 MED ORDER — PHENYLEPHRINE HCL (PRESSORS) 10 MG/ML IV SOLN
INTRAVENOUS | Status: AC
  Filled 2021-05-29: qty 1

## 2021-05-29 MED ORDER — BUPIVACAINE-EPINEPHRINE (PF) 0.25% -1:200000 IJ SOLN
INTRAMUSCULAR | Status: AC
  Filled 2021-05-29: qty 30

## 2021-05-29 MED ORDER — ONDANSETRON HCL 4 MG/2ML IJ SOLN
4.0000 mg | Freq: Four times a day (QID) | INTRAMUSCULAR | Status: DC | PRN
  Administered 2021-05-31: 4 mg via INTRAVENOUS
  Filled 2021-05-29: qty 2

## 2021-05-29 MED ORDER — ACETAMINOPHEN 650 MG RE SUPP
650.0000 mg | Freq: Four times a day (QID) | RECTAL | Status: DC | PRN
Start: 2021-05-29 — End: 2021-05-29

## 2021-05-29 MED ORDER — RAMIPRIL 5 MG PO CAPS
5.0000 mg | ORAL_CAPSULE | Freq: Two times a day (BID) | ORAL | Status: DC
  Filled 2021-05-29: qty 1

## 2021-05-29 MED ORDER — LABETALOL HCL 5 MG/ML IV SOLN
INTRAVENOUS | Status: AC
  Filled 2021-05-29: qty 4

## 2021-05-29 MED ORDER — ACETAMINOPHEN 650 MG RE SUPP: 650.0000 mg | Freq: Four times a day (QID) | RECTAL | Status: DC | PRN

## 2021-05-29 MED ORDER — FENTANYL CITRATE (PF) 100 MCG/2ML IJ SOLN: 25.0000 ug | INTRAMUSCULAR | Status: DC | PRN

## 2021-05-29 MED ORDER — 0.9 % SODIUM CHLORIDE (POUR BTL) OPTIME
TOPICAL | Status: DC | PRN
  Administered 2021-05-29: 10 mL

## 2021-05-29 MED ORDER — ESMOLOL HCL 100 MG/10ML IV SOLN
INTRAVENOUS | Status: DC | PRN
  Administered 2021-05-29 (×2): 20 mg via INTRAVENOUS

## 2021-05-29 MED ORDER — CEFAZOLIN SODIUM-DEXTROSE 2-4 GM/100ML-% IV SOLN
2.0000 g | INTRAVENOUS | Status: AC
  Administered 2021-05-29: 2 g via INTRAVENOUS
  Filled 2021-05-29: qty 100

## 2021-05-29 MED ORDER — BUPIVACAINE-EPINEPHRINE 0.25% -1:200000 IJ SOLN
INTRAMUSCULAR | Status: DC | PRN
  Administered 2021-05-29: 30 mL

## 2021-05-29 MED ORDER — LIDOCAINE HCL (CARDIAC) PF 100 MG/5ML IV SOSY
PREFILLED_SYRINGE | INTRAVENOUS | Status: DC | PRN
  Administered 2021-05-29: 60 mg via INTRAVENOUS

## 2021-05-29 MED ORDER — SODIUM CHLORIDE (PF) 0.9 % IJ SOLN
INTRAMUSCULAR | Status: AC
  Filled 2021-05-29: qty 20

## 2021-05-29 MED ORDER — FENTANYL CITRATE (PF) 100 MCG/2ML IJ SOLN
50.0000 ug | INTRAMUSCULAR | Status: DC | PRN
  Administered 2021-05-29 – 2021-05-30 (×4): 50 ug via INTRAVENOUS
  Filled 2021-05-29 (×2): qty 2

## 2021-05-29 MED ORDER — PROPOFOL 10 MG/ML IV BOLUS
INTRAVENOUS | Status: DC | PRN
  Administered 2021-05-29: 120 mg via INTRAVENOUS

## 2021-05-29 MED ORDER — CEFAZOLIN SODIUM 1 G IJ SOLR
INTRAMUSCULAR | Status: AC
  Filled 2021-05-29: qty 20

## 2021-05-29 MED ORDER — SUGAMMADEX SODIUM 200 MG/2ML IV SOLN
INTRAVENOUS | Status: DC | PRN
  Administered 2021-05-29: 220 mg via INTRAVENOUS

## 2021-05-29 MED ORDER — PROPOFOL 10 MG/ML IV BOLUS
INTRAVENOUS | Status: AC
  Filled 2021-05-29: qty 20

## 2021-05-29 MED ORDER — PANTOPRAZOLE SODIUM 40 MG IV SOLR
40.0000 mg | Freq: Every day | INTRAVENOUS | Status: DC
  Administered 2021-05-29 – 2021-05-30 (×2): 40 mg via INTRAVENOUS
  Filled 2021-05-29 (×2): qty 40

## 2021-05-29 MED ORDER — SODIUM CHLORIDE 0.9 % IV BOLUS
500.0000 mL | Freq: Once | INTRAVENOUS | Status: AC
  Administered 2021-05-29: 500 mL via INTRAVENOUS

## 2021-05-29 MED ORDER — ROCURONIUM BROMIDE 100 MG/10ML IV SOLN
INTRAVENOUS | Status: DC | PRN
  Administered 2021-05-29: 10 mg via INTRAVENOUS
  Administered 2021-05-29: 30 mg via INTRAVENOUS
  Administered 2021-05-29: 20 mg via INTRAVENOUS

## 2021-05-29 MED ORDER — ENOXAPARIN SODIUM 40 MG/0.4ML IJ SOSY
40.0000 mg | PREFILLED_SYRINGE | INTRAMUSCULAR | Status: DC
  Administered 2021-05-29 – 2021-05-30 (×2): 40 mg via SUBCUTANEOUS
  Filled 2021-05-29 (×2): qty 0.4

## 2021-05-29 MED ORDER — FENTANYL CITRATE (PF) 100 MCG/2ML IJ SOLN
100.0000 ug | Freq: Once | INTRAMUSCULAR | Status: AC
Start: 2021-05-29 — End: 2021-05-29
  Administered 2021-05-29: 100 ug via INTRAVENOUS
  Filled 2021-05-29: qty 2

## 2021-05-29 MED ORDER — SUCCINYLCHOLINE CHLORIDE 200 MG/10ML IV SOSY
PREFILLED_SYRINGE | INTRAVENOUS | Status: DC | PRN
  Administered 2021-05-29: 140 mg via INTRAVENOUS

## 2021-05-29 MED ORDER — INDOCYANINE GREEN 25 MG IV SOLR
INTRAVENOUS | Status: DC | PRN
  Administered 2021-05-29: 5 mg via INTRAVENOUS

## 2021-05-29 SURGICAL SUPPLY — 50 items
BAG INFUSER PRESSURE 100CC (MISCELLANEOUS) IMPLANT
BLADE SURG SZ11 CARB STEEL (BLADE) ×3 IMPLANT
CANISTER SUCT 1200ML W/VALVE (MISCELLANEOUS) IMPLANT
CHLORAPREP W/TINT 26 (MISCELLANEOUS) ×3 IMPLANT
COVER TIP SHEARS 8 DVNC (MISCELLANEOUS) ×2 IMPLANT
COVER TIP SHEARS 8MM DA VINCI (MISCELLANEOUS) ×3
COVER WAND RF STERILE (DRAPES) ×3 IMPLANT
DEFOGGER SCOPE WARMER CLEARIFY (MISCELLANEOUS) ×3 IMPLANT
DERMABOND ADVANCED (GAUZE/BANDAGES/DRESSINGS) ×1
DERMABOND ADVANCED .7 DNX12 (GAUZE/BANDAGES/DRESSINGS) ×2 IMPLANT
DRAPE ARM DVNC X/XI (DISPOSABLE) ×6 IMPLANT
DRAPE COLUMN DVNC XI (DISPOSABLE) ×2 IMPLANT
DRAPE DA VINCI XI ARM (DISPOSABLE) ×9
DRAPE DA VINCI XI COLUMN (DISPOSABLE) ×3
ELECT REM PT RETURN 9FT ADLT (ELECTROSURGICAL) ×6
ELECTRODE REM PT RTRN 9FT ADLT (ELECTROSURGICAL) ×4 IMPLANT
GAUZE 4X4 16PLY ~~LOC~~+RFID DBL (SPONGE) ×3 IMPLANT
GLOVE SURG ENC MOIS LTX SZ6.5 (GLOVE) ×12 IMPLANT
GLOVE SURG UNDER POLY LF SZ6.5 (GLOVE) ×9 IMPLANT
GOWN STRL REUS W/ TWL LRG LVL3 (GOWN DISPOSABLE) ×8 IMPLANT
GOWN STRL REUS W/TWL LRG LVL3 (GOWN DISPOSABLE) ×12
IRRIGATOR SUCT 8 DISP DVNC XI (IRRIGATION / IRRIGATOR) IMPLANT
IRRIGATOR SUCTION 8MM XI DISP (IRRIGATION / IRRIGATOR)
IV CATH ANGIO 12GX3 LT BLUE (NEEDLE) ×3 IMPLANT
IV NS 1000ML (IV SOLUTION)
IV NS 1000ML BAXH (IV SOLUTION) IMPLANT
KIT PINK PAD W/HEAD ARE REST (MISCELLANEOUS) ×3 IMPLANT
KIT PINK PAD W/HEAD ARM REST (MISCELLANEOUS) ×2 IMPLANT
LABEL OR SOLS (LABEL) IMPLANT
MANIFOLD NEPTUNE II (INSTRUMENTS) ×3 IMPLANT
MESH 3DMAX 4X6 RT LRG (Mesh General) ×1 IMPLANT
MESH 3DMAX MID 4X6 RT LRG (Mesh General) ×2 IMPLANT
NEEDLE HYPO 22GX1.5 SAFETY (NEEDLE) ×3 IMPLANT
NEEDLE INSUFFLATION 14GA 120MM (NEEDLE) ×3 IMPLANT
OBTURATOR OPTICAL STANDARD 8MM (TROCAR) ×3
OBTURATOR OPTICAL STND 8 DVNC (TROCAR) ×2
OBTURATOR OPTICALSTD 8 DVNC (TROCAR) ×2 IMPLANT
PACK LAP CHOLECYSTECTOMY (MISCELLANEOUS) ×3 IMPLANT
SEAL CANN UNIV 5-8 DVNC XI (MISCELLANEOUS) ×6 IMPLANT
SEAL XI 5MM-8MM UNIVERSAL (MISCELLANEOUS) ×9
SET TUBE SMOKE EVAC HIGH FLOW (TUBING) ×3 IMPLANT
SOLUTION ELECTROLUBE (MISCELLANEOUS) ×3 IMPLANT
SUT MNCRL 4-0 (SUTURE) ×3
SUT MNCRL 4-0 27XMFL (SUTURE) ×2
SUT VIC AB 2-0 SH 27 (SUTURE) ×3
SUT VIC AB 2-0 SH 27XBRD (SUTURE) ×2 IMPLANT
SUT VLOC 90 S/L VL9 GS22 (SUTURE) ×3 IMPLANT
SUTURE MNCRL 4-0 27XMF (SUTURE) ×2 IMPLANT
TAPE TRANSPORE STRL 2 31045 (GAUZE/BANDAGES/DRESSINGS) IMPLANT
TRAY FOLEY MTR SLVR 16FR STAT (SET/KITS/TRAYS/PACK) ×3 IMPLANT

## 2021-05-29 NOTE — Anesthesia Preprocedure Evaluation (Signed)
Anesthesia Evaluation  Patient identified by MRN, date of birth, ID band Patient awake    Reviewed: Allergy & Precautions, H&P , NPO status , Patient's Chart, lab work & pertinent test results, reviewed documented beta blocker date and time   Airway Mallampati: III  TM Distance: >3 FB Neck ROM: full    Dental  (+) Teeth Intact, Poor Dentition   Pulmonary neg pulmonary ROS, former smoker,    Pulmonary exam normal        Cardiovascular Exercise Tolerance: Poor hypertension, On Medications (-) angina+ CAD, + Past MI, + Cardiac Stents and +CHF  Normal cardiovascular exam Rhythm:regular Rate:Normal     Neuro/Psych negative neurological ROS  negative psych ROS   GI/Hepatic Neg liver ROS, hiatal hernia, PUD, GERD  Medicated,  Endo/Other  diabetes  Renal/GU Renal disease  negative genitourinary   Musculoskeletal   Abdominal   Peds  Hematology  (+) Blood dyscrasia, anemia ,   Anesthesia Other Findings Past Medical History: No date: Acute duodenal ulcer with hemorrhage but without obstruction     Comment:  required  bld. transfusion No date: Anemia, iron deficiency No date: Arthritis     Comment:  OA- pt. reports its. minor 2004: CAD (coronary artery disease)     Comment:  a. s/p MI treated with drug-eluting stent placement to               the proximal left anterior descending in late 2004 //               Echocardiogram 7/22: EF 45-50, mild LVH, Gr 1 DD, inf and              apical HK, normal RVSF, trivial MR, dilated ascending               aorta (38 mm) No date: CKD (chronic kidney disease), stage III ([place]) 10/2020: COVID-19 virus infection No date: Diverticulosis of colon 09/21/2014: Duodenal stricture - recurrent No date: Erb's palsy     Comment:  birth trauma, pt. reports he was 11lbs. + at birth  No date: GERD (gastroesophageal reflux disease) No date: H/O hiatal hernia No date: Helicobacter pylori  gastritis     Comment:  recurrent No date: Hypercholesteremia No date: Hyperglycemia No date: Hypertension No date: Myocardial infarct [facility]) 2015: NAFLD (nonalcoholic fatty liver disease)     Comment:  with liver cysts No date: Paralysis of upper limb (HCC)     Comment:  R arm- birth injury No date: Personal history of colonic polyps No date: Small bowel obstruction ([place]) No date: Urticaria     Comment:  Lat left arm Past Surgical History: 03/2013: ANTERIOR CERVICAL DECOMP/DISCECTOMY FUSION     Comment:  C3/4, C4/5 HNP (kritzer) 03/15/2013: ANTERIOR CERVICAL DECOMP/DISCECTOMY FUSION; N/A     Comment:  Procedure: ANTERIOR CERVICAL DECOMPRESSION/DISCECTOMY               FUSION 2 LEVELS;  Surgeon: Faythe Ghee, MD;                Location: [facility];  Service: Neurosurgery;                Laterality: N/A;  Cervical three-four, cervical four-five              anterior cervical diskectomy fusion with a trabecular               metal plus plate  [id number]: CARDIAC CATHETERIZATION     Comment:  stent placed 07/08/2016: CARDIAC CATHETERIZATION; N/A     Comment:  Procedure: Left Heart Cath and Coronary Angiography;                Surgeon: Sherren Mocha, MD;  Location: [place] [facility]               [facility];  Service: Cardiovascular;  Laterality: N/A; multiple: COLONOSCOPY     Comment:  diverticulosis, int hem, h/o adenomatous polyps               [name]) 07/2014: COLONOSCOPY     Comment:  severe diverticulosis, no rpt needed [name]) 2006: CORONARY STENT PLACEMENT     Comment:  drug-eluting 11/26/10: ESOPHAGOGASTRODUODENOSCOPY     Comment:  mild gastritis, duodenitis, duodenal stricture (Dr.               [name]) 10//2015: ESOPHAGOGASTRODUODENOSCOPY     Comment:  duodenal stricture, ?recurrent ulcer [name]) 2006: HERNIA REPAIR; Right     Comment:  Dr. Hassell Done No date: KNEE SURGERY     Comment:  left No date: SHOULDER SURGERY     Comment:  right No date: spinal cyst removal No  date: WRIST SURGERY   Reproductive/Obstetrics negative OB ROS                             Anesthesia Physical Anesthesia Plan  ASA: 3 and emergent  Anesthesia Plan: General ETT   Post-op Pain Management:    Induction: Rapid sequence and Intravenous  PONV Risk Score and Plan: 3  Airway Management Planned: Video Laryngoscope Planned  Additional Equipment:   Intra-op Plan:   Post-operative Plan:   Informed Consent: I have reviewed the patients History and Physical, chart, labs and discussed the procedure including the risks, benefits and alternatives for the proposed anesthesia with the patient or authorized representative who has indicated his/her understanding and acceptance.     Dental Advisory Given  Plan Discussed with: CRNA  Anesthesia Plan Comments:         Anesthesia Quick Evaluation

## 2021-05-29 NOTE — ED Provider Notes (Signed)
[facility] Emergency Department Provider Note  ____________________________________________   Event Date/Time   First MD Initiated Contact with Patient 05/29/21 0107     (approximate)  I have reviewed the triage vital signs and the nursing notes.   HISTORY  Chief Complaint Flank Pain    HPI Luis Sweeney is a 80 y.o. male with CKD, coronary disease, partial small bowel obstruction who comes in with right lower quadrant pain.  Patient reports sudden onset of right lower quadrant pain, constant, nothing makes it better or worse.  No nausea or vomiting.  States that he has an inguinal hernia on that side.  Denies any dysuria or hematuria.  No fevers.  No chest pain, shortness of breath.          Past Medical History:  Diagnosis Date   Acute duodenal ulcer with hemorrhage but without obstruction    required  bld. transfusion   Anemia, iron deficiency    Arthritis    OA- pt. reports its. minor   CAD (coronary artery disease) 2004   a. s/p MI treated with drug-eluting stent placement to the proximal left anterior descending in late 2004 // Echocardiogram 7/22: EF 45-50, mild LVH, Gr 1 DD, inf and apical HK, normal RVSF, trivial MR, dilated ascending aorta (38 mm)   CKD (chronic kidney disease), stage III ([place])    COVID-19 virus infection 10/2020   Diverticulosis of colon    Duodenal stricture - recurrent 09/21/2014   Erb's palsy    birth trauma, pt. reports he was 11lbs. + at birth    GERD (gastroesophageal reflux disease)    H/O hiatal hernia    Helicobacter pylori gastritis    recurrent   Hypercholesteremia    Hyperglycemia    Hypertension    Myocardial infarct ([place])    NAFLD (nonalcoholic fatty liver disease) 2015   with liver cysts   Paralysis of upper limb (HCC)    R arm- birth injury   Personal history of colonic polyps    Small bowel obstruction ([place])    Urticaria    Lat left arm    Patient Active Problem List   Diagnosis Date  Noted   Dilatation of thoracic aorta ([place]) 05/11/2021   CHF (congestive heart failure) ([place]) 05/11/2021   SBO (small bowel obstruction) ([place]) 11/21/2020   Acute renal failure superimposed on stage 3 chronic kidney disease ([place]) 11/21/2020   Hyperkalemia 11/21/2020   Leukocytosis 11/21/2020   Primary osteoarthritis of right hip 06/19/2020   Acute left-sided low back pain with left-sided sciatica 05/17/2019   Essential hypertension 09/27/2018   Health maintenance examination 02/25/2016   Controlled diabetes mellitus type 2 with complications ([place]) [id number]   Irregular heart beat 02/25/2016   Advanced care planning/counseling discussion 02/20/2015   Obesity, Class I, BMI 30-34.9 02/20/2015   Duodenal stricture - recurrent 09/21/2014   Hx of peptic ulcer 06/11/2014   NAFLD (nonalcoholic fatty liver disease)    Medicare annual wellness visit, subsequent 05/31/2012   Vitamin B12 deficiency 05/31/2012   Recurrent right inguinal hernia 05/31/2012   Dysuria 11/26/2010   Coronary atherosclerosis of native coronary artery 03/13/2009   CKD stage 3 due to type 2 diabetes mellitus ([place]) 03/13/2009   Hyperlipidemia associated with type 2 diabetes mellitus ([place]) 01/04/2008   History of colonic polyps 11/07/2007   GERD 03/08/2007   MYOCARDIAL INFARCTION, HX OF 03/07/2007   ERB'S PALSY 03/07/2007    Past Surgical History:  Procedure Laterality Date   ANTERIOR  CERVICAL DECOMP/DISCECTOMY FUSION  03/2013   C3/4, C4/5 HNP (kritzer)   ANTERIOR CERVICAL DECOMP/DISCECTOMY FUSION N/A 03/15/2013   Procedure: ANTERIOR CERVICAL DECOMPRESSION/DISCECTOMY FUSION 2 LEVELS;  Surgeon: Faythe Ghee, MD;  Location: [place] [facility];  Service: Neurosurgery;  Laterality: N/A;  Cervical three-four, cervical four-five anterior cervical diskectomy fusion with a trabecular metal plus plate    CARDIAC CATHETERIZATION  2006   stent placed   CARDIAC CATHETERIZATION N/A 07/08/2016   Procedure: Left Heart Cath and Coronary  Angiography;  Surgeon: Sherren Mocha, MD;  Location: [place] [facility];  Service: Cardiovascular;  Laterality: N/A;   COLONOSCOPY  multiple   diverticulosis, int hem, h/o adenomatous polyps [name])   COLONOSCOPY  07/2014   severe diverticulosis, no rpt needed [name])   CORONARY STENT PLACEMENT  2006   drug-eluting   ESOPHAGOGASTRODUODENOSCOPY  11/26/10   mild gastritis, duodenitis, duodenal stricture (Dr. [name])   ESOPHAGOGASTRODUODENOSCOPY  10//2015   duodenal stricture, ?recurrent ulcer [name])   HERNIA REPAIR Right 2006   Dr. Hassell Done   KNEE SURGERY     left   SHOULDER SURGERY     right   spinal cyst removal     WRIST SURGERY      Prior to Admission medications   Medication Sig Start Date End Date Taking? Authorizing Provider  aspirin EC 81 MG tablet Take 81 mg by mouth daily.    [provider]  benzonatate (TESSALON) 200 MG capsule Take 1 capsule (200 mg total) by mouth 3 (three) times daily as needed for cough. 11/23/20   Sidney Ace, MD  Cholecalciferol (VITAMIN D) 125 MCG (5000 UT) CAPS Take 1 capsule by mouth once a week. 02/07/20   Ria Bush, MD  lovastatin (MEVACOR) 40 MG tablet TAKE 1 TABLET BY MOUTH EVERY MONDAY, [place], AND FRIDAY 03/17/21   Ria Bush, MD  pantoprazole (PROTONIX) 40 MG tablet Take 40 mg by mouth as needed.    [provider]  ramipril (ALTACE) 5 MG capsule TAKE 1 CAPSULE BY MOUTH TWICE DAILY 10/31/20   Sherren Mocha, MD  vitamin B-12 (CYANOCOBALAMIN) 1000 MCG tablet Take 1,000 mcg by mouth daily.    [provider]    Allergies Prednisone, Codeine, Lipitor [atorvastatin], Morphine, and Simvastatin  Family History  Problem Relation Age of Onset   Diabetes Father    Heart disease Father        CHF   Kidney disease Father        kidney failure   Heart failure Father    Diabetes Sister    Hypertension Sister    Cancer Sister        pelvic mass   Hypertension Sister    Arthritis Sister     Ovarian cancer Other     Social History Social History   Tobacco Use   Smoking status: Former    Packs/day: 1.50    Years: 3.00    Pack years: 4.50    Types: Cigarettes    Quit date: 02/16/1985    Years since quitting: 36.3   Smokeless tobacco: Never  Vaping Use   Vaping Use: Never used  Substance Use Topics   Alcohol use: Yes    Alcohol/week: 0.0 standard drinks    Comment: two to three times a year   Drug use: No      Review of Systems Constitutional: No fever/chills Eyes: No visual changes. ENT: No sore throat. Cardiovascular: Denies chest pain. Respiratory: Denies shortness of breath. Gastrointestinal: Positive abdominal pain  Genitourinary: Negative for dysuria. Musculoskeletal: Negative for back pain. Skin: Negative for rash. Neurological: Negative for headaches, focal weakness or numbness. All other ROS negative ____________________________________________   PHYSICAL EXAM:  VITAL SIGNS: ED Triage Vitals  Enc Vitals Group     BP 05/28/21 2345 (!) 165/78     Pulse Rate 05/28/21 2345 76     Resp 05/28/21 2345 (!) 22     Temp 05/28/21 2342 97.8 F (36.6 C)     Temp Source 05/28/21 2342 Oral     SpO2 05/28/21 2345 98 %     Weight --      Height --      Head Circumference --      Peak Flow --      Pain Score 05/28/21 2343 10     Pain Loc --      Pain Edu? --      Excl. in [place]? --     Constitutional: Alert and oriented. Well appearing and in no acute distress. Eyes: Conjunctivae are normal. EOMI. Head: Atraumatic. Nose: No congestion/rhinnorhea. Mouth/Throat: Mucous membranes are moist.   Neck: No stridor. Trachea Midline. FROM Cardiovascular: Normal rate, regular rhythm. Grossly normal heart sounds.  Good peripheral circulation. Respiratory: Normal respiratory effort.  No retractions. Lungs CTAB. Gastrointestinal: Positive right lower quadrant abdominal pain, fullness in the inguinal region without any overlying skin changes.  No abdominal  bruits.  Musculoskeletal: No lower extremity tenderness nor edema.  No joint effusions. Neurologic:  Normal speech and language. No gross focal neurologic deficits are appreciated.  Skin:  Skin is warm, dry and intact. No rash noted. Psychiatric: Mood and affect are normal. Speech and behavior are normal. GU: Deferred   ____________________________________________   LABS (all labs ordered are listed, but only abnormal results are displayed)  Labs Reviewed  BASIC METABOLIC PANEL - Abnormal; Notable for the following components:      Result Value   Glucose, Bld 158 (*)    BUN 29 (*)    Creatinine, Ser 2.14 (*)    GFR, Estimated 31 (*)    All other components within normal limits  HEPATIC FUNCTION PANEL - Abnormal; Notable for the following components:   Alkaline Phosphatase 35 (*)    All other components within normal limits  RESP PANEL BY RT-PCR (FLU A&B, COVID) ARPGX2  CBC  LIPASE, BLOOD  URINALYSIS, COMPLETE (UACMP) WITH MICROSCOPIC   ____________________________________________    RADIOLOGY   Official radiology report(s): CT ABDOMEN PELVIS WO CONTRAST  Result Date: 05/29/2021 CLINICAL DATA:  Right lower quadrant abdominal pain. EXAM: CT ABDOMEN AND PELVIS WITHOUT CONTRAST TECHNIQUE: Multidetector CT imaging of the abdomen and pelvis was performed following the standard protocol without IV contrast. COMPARISON:  CT abdomen pelvis dated 11/21/2020. FINDINGS: Evaluation of this exam is limited in the absence of intravenous contrast. Lower chest: The visualized lung bases are clear. There is coronary vascular calcification primarily involving the LAD. No intra-abdominal free air or free fluid. Hepatobiliary: Multiple liver cyst similar to prior CT. No intrahepatic biliary dilatation. The gallbladder is unremarkable. Pancreas: Unremarkable. No pancreatic ductal dilatation or surrounding inflammatory changes. Spleen: Normal in size without focal abnormality. Adrenals/Urinary Tract:  The adrenal glands are unremarkable. There is no hydronephrosis or nephrolithiasis. Probable small parapelvic cysts. The visualized ureters and urinary bladder appear unremarkable. Stomach/Bowel: There is severe sigmoid diverticulosis and scattered colonic diverticula without active inflammatory changes. There is herniation of loop of distal small bowel into the right inguinal canal. There is inflammatory changes of  the herniated loop of small bowel with small amount of fluid within the right inguinal canal. There is narrowing of the bowel at the neck of the hernia with mild dilatation of the proximal loop measuring up to 3.3 cm in diameter. Findings consistent with a small-bowel obstruction secondary to right inguinal hernia. The appendix is normal. Vascular/Lymphatic: Moderate aortoiliac atherosclerotic disease. The IVC is unremarkable. No portal venous gas. There is no adenopathy. Reproductive: The prostate and seminal vesicles are grossly unremarkable. No pelvic mass. Other: None Musculoskeletal: Degenerative changes of the spine. No acute osseous pathology. IMPRESSION: 1. Small-bowel obstruction secondary to right inguinal hernia. 2. Colonic diverticulosis. 3. Aortic Atherosclerosis (ICD10-I70.0). Electronically Signed   By: Anner Crete M.D.   On: 05/29/2021 00:58    ____________________________________________   PROCEDURES  Procedure(s) performed (including Critical Care):  Procedures   ____________________________________________   INITIAL IMPRESSION / ASSESSMENT AND PLAN / ED COURSE  Luis Sweeney was evaluated in Emergency Department on 05/29/2021 for the symptoms described in the history of present illness. He was evaluated in the context of the global COVID-19 pandemic, which necessitated consideration that the patient might be at risk for infection with the SARS-CoV-2 virus that causes COVID-19. Institutional protocols and algorithms that pertain to the evaluation of patients at risk  for COVID-19 are in a state of rapid change based on information released by regulatory bodies including the CDC and federal and state organizations. These policies and algorithms were followed during the patient's care in the ED.    Patient was initially seen in triage with right lower quadrant pain therefore I ordered a CT scan to evaluate for kidney stone, appendicitis, obstruction, perforation or other acute pathology.  CT imaging resulted with obstruction of the inguinal region.  Patient was still in a triage waiting room but I was able to call surgery Dr. Peyton Najjar.  Charge nurse was alerted to get patient into her room in order to trial reduction.  By the time patient was placed into a room and surgery was already at bedside and was performing bedside reduction.  So far attempted but not excess full therefore patient will be admitted to the surgery team for potential surgery.  Labs show slightly elevated kidney function so patient started a little bit of fluid.  Patient was given some IV doses of fentanyl to help with pain      ____________________________________________   FINAL CLINICAL IMPRESSION(S) / ED DIAGNOSES   Final diagnoses:  Inguinal hernia with obstruction      MEDICATIONS GIVEN DURING THIS VISIT:  Medications  fentaNYL (SUBLIMAZE) injection 100 mcg (has no administration in time range)  fentaNYL (SUBLIMAZE) injection 50 mcg (50 mcg Intravenous Given 05/28/21 2357)  ondansetron (ZOFRAN) injection 4 mg (4 mg Intravenous Given 05/28/21 2357)     ED Discharge Orders     None        Note:  This document was prepared using Dragon voice recognition software and may include unintentional dictation errors.    Vanessa , MD 05/29/21 [phone]

## 2021-05-29 NOTE — Anesthesia Procedure Notes (Signed)
Procedure Name: Intubation Date/Time: 05/29/2021 3:45 AM Performed by: Lendon Colonel, CRNA Pre-anesthesia Checklist: Patient identified, Patient being monitored, Timeout performed, Emergency Drugs available and Suction available Patient Re-evaluated:Patient Re-evaluated prior to induction Oxygen Delivery Method: Circle system utilized Preoxygenation: Pre-oxygenation with 100% oxygen Induction Type: IV induction, Rapid sequence and Cricoid Pressure applied Laryngoscope Size: 3 and McGraph Grade View: Grade I Tube type: Oral Tube size: 7.5 mm Number of attempts: 1 Airway Equipment and Method: Stylet Placement Confirmation: ETT inserted through vocal cords under direct vision, positive ETCO2 and breath sounds checked- equal and bilateral Secured at: 22 cm Tube secured with: Tape Dental Injury: Teeth and Oropharynx as per pre-operative assessment

## 2021-05-29 NOTE — Evaluation (Signed)
Physical Therapy Evaluation Patient Details Name: Luis Sweeney MRN: [id number] DOB: Jun 26, 1941 Today's Date: 05/29/2021   History of Present Illness  Pt is an 80 yo M diagnosed with recurring right incarcerated inguinal hernia and is s/p laparoscopic repair.  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  Clinical Impression  Pt was pleasant and motivated to participate during the session. Pt gave good effort and vitals were stable throughout session. Pt performed functional mobility with modified independence-supervision. Pt is limited with primarily with transfers from non-elevated surfaces and OOB activities secondary to post op abdominal pain. Pt will benefit from continued outpatient PT upon discharge to safely address deficits listed in patient problem list for decreased caregiver assistance and eventual return to PLOF.      Follow Up Recommendations Other (comment) (Continue with current outpatient PT)    Equipment Recommendations  None recommended by PT    Recommendations for Other Services       Precautions / Restrictions Precautions Precautions: Fall Restrictions Weight Bearing Restrictions: No      Mobility  Bed Mobility Overal bed mobility: Modified Independent             General bed mobility comments: HOB elevated using bed rails; Pt utilized log rolling technique    Transfers Overall transfer level: Modified independent               General transfer comment: Performed sit/stand from elevated surface to reduce strain on abdomen.  Ambulation/Gait Ambulation/Gait assistance: Supervision Gait Distance (Feet): 350 Feet Assistive device: None Gait Pattern/deviations: Step-through pattern;Wide base of support;Drifts right/left Gait velocity: decreased   General Gait Details: Pt was overall steady walking with no AD without LOB. Pt demonstrated mild drifting but reported  walking similarly to baseline gait.  Stairs            Wheelchair Mobility    Modified Rankin (Stroke Patients Only)       Balance Overall balance assessment: Needs assistance Sitting-balance support: Feet supported;No upper extremity supported Sitting balance-Leahy Scale: Normal     Standing balance support: No upper extremity supported Standing balance-Leahy Scale: Good                               Pertinent Vitals/Pain Pain Assessment: 0-10 Pain Score: 4  Pain Location: abdominal Pain Descriptors / Indicators: Aching;Sore Pain Intervention(s): Monitored during session;Repositioned    Home Living Family/patient expects to be discharged to:: Private residence Living Arrangements: Spouse/significant other Available Help at Discharge: Family;Available 24 hours/day (Son and 2 daughters live locally) Type of Home: House Home Access: Ramped entrance     Home Layout: One level Home Equipment: Environmental consultant - 2 wheels;Walker - 4 wheels (Has access to wife's equipment)      Prior Function Level of Independence: Independent         Comments: Pt independent with ambulation and ADLs; Working as full Scientist, 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 Lives with wife who uses SPC 2/2 hx of CVA & balance issues. No fall hx     Hand Dominance   Dominant Hand: Left    Extremity/Trunk Assessment   Upper Extremity Assessment Upper Extremity Assessment: Overall WFL for tasks assessed;RUE deficits/detail RUE Deficits / Details: Paralysis 2/2 Erb's Palsy    Lower Extremity Assessment Lower Extremity Assessment: Overall WFL for tasks assessed    Cervical / Trunk Assessment Cervical / Trunk Assessment: Normal  Communication   Communication: No difficulties  Cognition Arousal/Alertness: Awake/alert Behavior During Therapy: WFL for tasks assessed/performed Overall Cognitive Status: Within Functional Limits for tasks assessed                                         General Comments      Exercises Total Joint Exercises Marching in Standing: AROM;Strengthening;Both;10 reps;Standing   Assessment/Plan    PT Assessment Patient needs continued PT services  PT Problem List Decreased activity tolerance;Decreased balance;Pain       PT Treatment Interventions DME instruction;Gait training;Stair training;Functional mobility training;Therapeutic activities;Therapeutic exercise;Balance training;Patient/family education    PT Goals (Current goals can be found in the Care Plan section)  Acute Rehab PT Goals Patient Stated Goal: Get better standing up and down PT Goal Formulation: With patient Time For Goal Achievement: 06/11/21 Potential to Achieve Goals: Good    Frequency Min 2X/week   Barriers to discharge        Co-evaluation               AM-PAC PT "6 Clicks" Mobility  Outcome Measure Help needed turning from your back to your side while in a flat bed without using bedrails?: None Help needed moving from lying on your back to sitting on the side of a flat bed without using bedrails?: None Help needed moving to and from a bed to a chair (including a wheelchair)?: None Help needed standing up from a chair using your arms (e.g., wheelchair or bedside chair)?: None Help needed to walk in hospital room?: A Little Help needed climbing 3-5 steps with a railing? : A Little 6 Click Score: 22    End of Session Equipment Utilized During Treatment: Gait belt Activity Tolerance: Patient tolerated treatment well Patient left: in bed;with bed alarm set;with call bell/phone within reach Nurse Communication: Mobility status (Notified that pt left sitting on EOB with bed alarm set) PT Visit Diagnosis: Unsteadiness on feet (R26.81);Pain Pain - Right/Left:  (abdominal) Pain - part of body:  (abdominal)    Time: [id number] PT Time Calculation (min) (ACUTE ONLY): 29 min   Charges:              Dayton Scrape SPT 05/29/21, 5:19 PM

## 2021-05-29 NOTE — H&P (Signed)
SURGICAL CONSULTATION NOTE   HISTORY OF PRESENT ILLNESS (HPI):  80 y.o. male presented to [facility] ED for evaluation of right groin pain. Patient reports pain started around 9 PM.  Pain localized to the right groin.  Pain does not radiate to the Parth body.  Pain is aggravated by movement of the groin or applying pressure.  There has been no alleviating factors.  Patient reports having history of right inguinal hernia.  He was doing preoperative work-up for hernia repair.  He had clearance by cardiology.  He has not contacted his surgeon to set the surgery after feeling with even.  At the ED he was found with normal white blood cell count.  CT scan of the abdomen pelvis shows inguinal hernia with incarcerated small bowel.  I personally evaluated the images.  I personally tried to reduce the hernia bedside using fentanyl and putting ice for at least 30 minutes but due to pain I was unable to reduce the hernia.  Surgery is consulted by Dr. Jari Pigg in this context for evaluation and management of perforated right inguinal hernia.  PAST MEDICAL HISTORY (PMH):  Past Medical History:  Diagnosis Date   Acute duodenal ulcer with hemorrhage but without obstruction    required  bld. transfusion   Anemia, iron deficiency    Arthritis    OA- pt. reports its. minor   CAD (coronary artery disease) 2004   a. s/p MI treated with drug-eluting stent placement to the proximal left anterior descending in late 2004 // Echocardiogram 7/22: EF 45-50, mild LVH, Gr 1 DD, inf and apical HK, normal RVSF, trivial MR, dilated ascending aorta (38 mm)   CKD (chronic kidney disease), stage III ([place])    COVID-19 virus infection 10/2020   Diverticulosis of colon    Duodenal stricture - recurrent 09/21/2014   Erb's palsy    birth trauma, pt. reports he was 11lbs. + at birth    GERD (gastroesophageal reflux disease)    H/O hiatal hernia    Helicobacter pylori gastritis    recurrent   Hypercholesteremia    Hyperglycemia     Hypertension    Myocardial infarct ([place])    NAFLD (nonalcoholic fatty liver disease) 2015   with liver cysts   Paralysis of upper limb ([facility])    R arm- birth injury   Personal history of colonic polyps    Small bowel obstruction ([place])    Urticaria    Lat left arm     PAST SURGICAL HISTORY ([place]):  Past Surgical History:  Procedure Laterality Date   ANTERIOR CERVICAL DECOMP/DISCECTOMY FUSION  03/2013   C3/4, C4/5 HNP (kritzer)   ANTERIOR CERVICAL DECOMP/DISCECTOMY FUSION N/A 03/15/2013   Procedure: ANTERIOR CERVICAL DECOMPRESSION/DISCECTOMY FUSION 2 LEVELS;  Surgeon: Faythe Ghee, MD;  Location: [facility];  Service: Neurosurgery;  Laterality: N/A;  Cervical three-four, cervical four-five anterior cervical diskectomy fusion with a trabecular metal plus plate    CARDIAC CATHETERIZATION  2006   stent placed   CARDIAC CATHETERIZATION N/A 07/08/2016   Procedure: Left Heart Cath and Coronary Angiography;  Surgeon: Sherren Mocha, MD;  Location: [facility];  Service: Cardiovascular;  Laterality: N/A;   COLONOSCOPY  multiple   diverticulosis, int hem, h/o adenomatous polyps [name])   COLONOSCOPY  07/2014   severe diverticulosis, no rpt needed [name])   CORONARY STENT PLACEMENT  2006   drug-eluting   ESOPHAGOGASTRODUODENOSCOPY  11/26/10   mild gastritis, duodenitis, duodenal stricture (Dr. [name])   ESOPHAGOGASTRODUODENOSCOPY  10//2015  duodenal stricture, ?recurrent ulcer [name])   HERNIA REPAIR Right 2006   Dr. Hassell Done   KNEE SURGERY     left   SHOULDER SURGERY     right   spinal cyst removal     WRIST SURGERY       MEDICATIONS:  Prior to Admission medications   Medication Sig Start Date End Date Taking? Authorizing Provider  aspirin EC 81 MG tablet Take 81 mg by mouth daily.   Yes [provider]  Cholecalciferol (VITAMIN D) 125 MCG (5000 UT) CAPS Take 1 capsule by mouth once a week. 02/07/20  Yes Ria Bush, MD  lovastatin (MEVACOR) 40 MG tablet  TAKE 1 TABLET BY MOUTH EVERY MONDAY, WEDNESDAY, AND FRIDAY 03/17/21  Yes Ria Bush, MD  pantoprazole (PROTONIX) 40 MG tablet Take 40 mg by mouth as needed.   Yes [provider]  ramipril (ALTACE) 5 MG capsule TAKE 1 CAPSULE BY MOUTH TWICE DAILY 10/31/20  Yes Sherren Mocha, MD  vitamin B-12 (CYANOCOBALAMIN) 1000 MCG tablet Take 1,000 mcg by mouth daily.   Yes [provider]     ALLERGIES:  Allergies  Allergen Reactions   Prednisone Other (See Comments)    Feels really bad   Codeine Hives and Itching   Lipitor [Atorvastatin] Other (See Comments)    Reaction:  Muscle pain    Morphine Hives and Itching   Simvastatin Other (See Comments)    Reaction:  Muscle pain      SOCIAL HISTORY:  Social History   Socioeconomic History   Marital status: Married    Spouse name: Not on file   Number of children: 3   Years of education: Not on file   Highest education level: Not on file  Occupational History   Occupation: HT Warden/ranger: [employer]    Comment: metal Fabrication/Stairs/Rails commerial  Tobacco Use   Smoking status: Former    Packs/day: 1.50    Years: 3.00    Pack years: 4.50    Types: Cigarettes    Quit date: 02/16/1985    Years since quitting: 36.3   Smokeless tobacco: Never  Vaping Use   Vaping Use: Never used  Substance and Sexual Activity   Alcohol use: Yes    Alcohol/week: 0.0 standard drinks    Comment: two to three times a year   Drug use: No   Sexual activity: Never    Comment: last few years.  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   Social Determinants of Radio broadcast assistant Strain: Low Risk    Difficulty of Paying Living  Expenses: Not hard at all  Food Insecurity: No Food Insecurity   Worried About Charity fundraiser in the Last Year: Never true   Arboriculturist in the Last Year: Never true  Transportation Needs: No Transportation Needs   Lack of Transportation (Medical): No   Lack of Transportation (Non-Medical): No  Physical Activity: Inactive   Days of Exercise per Week: 0 days   Minutes of Exercise per Session: 0 min  Stress: No Stress Concern Present   Feeling of Stress :  Not at all  Social Connections: Not on file  Intimate Partner Violence: Not At Risk   Fear of Current or Ex-Partner: No   Emotionally Abused: No   Physically Abused: No   Sexually Abused: No      FAMILY HISTORY:  Family History  Problem Relation Age of Onset   Diabetes Father    Heart disease Father        CHF   Kidney disease Father        kidney failure   Heart failure Father    Diabetes Sister    Hypertension Sister    Cancer Sister        pelvic mass   Hypertension Sister    Arthritis Sister    Ovarian cancer Other      REVIEW OF SYSTEMS:  Constitutional: denies weight loss, fever, chills, or sweats  Eyes: denies any other vision changes, history of eye injury  ENT: denies sore throat, hearing problems  Respiratory: denies shortness of breath, wheezing  Cardiovascular: denies chest pain, palpitations  Gastrointestinal: abdominal pain, nausea and vomiting.  Positive for right groin pain Genitourinary: denies burning with urination or urinary frequency Musculoskeletal: denies any other joint pains or cramps  Skin: denies any other rashes or skin discolorations  Neurological: denies any other headache, dizziness, weakness  Psychiatric: denies any other depression, anxiety   All other review of systems were negative   VITAL SIGNS:  Temp:  [97.8 F (36.6 C)] 97.8 F (36.6 C) (08/17 2342) Pulse Rate:  [76-78] 78 (08/18 0145) Resp:  [16-22] 16 (08/18 0145) BP: (142-165)/(76-78) 142/76 (08/18  0145) SpO2:  [96 %-98 %] 96 % (08/18 0145)             INTAKE/OUTPUT:  This shift: No intake/output data recorded.  Last 2 shifts: '@IOLAST2SHIFTS'$ @   PHYSICAL EXAM:  Constitutional:  -- Normal body habitus  -- Awake, alert, and oriented x3  Eyes:  -- Pupils equally round and reactive to light  -- No scleral icterus  Ear, nose, and throat:  -- No jugular venous distension  Pulmonary:  -- No crackles  -- Equal breath sounds bilaterally -- Breathing non-labored at rest Cardiovascular:  -- S1, S2 present  -- No pericardial rubs Gastrointestinal:  -- Abdomen soft, nontender, non-distended, no guarding or rebound tenderness -- Tender right groin inguinal hernia, incarcerated.  No skin changes. Musculoskeletal and Integumentary:  -- Wounds: None appreciated -- Extremities: B/L UE and LE FROM, hands and feet warm, no edema  Neurologic:  -- Motor function: intact and symmetric -- Sensation: intact and symmetric   Labs:  CBC Latest Ref Rng & Units 05/28/2021 12/13/2020 11/23/2020  WBC 4.0 - 10.5 K/uL 8.3 7.0 6.3  Hemoglobin 13.0 - 17.0 g/dL 14.7 13.2 12.1(L)  Hematocrit 39.0 - 52.0 % 42.8 39.1 36.7(L)  Platelets 150 - 400 K/uL 217 237.0 229   CMP Latest Ref Rng & Units 05/28/2021 12/13/2020 11/23/2020  Glucose 70 - 99 mg/dL 158(H) 95 102(H)  BUN 8 - 23 mg/dL 29(H) 25(H) 20  Creatinine 0.61 - 1.24 mg/dL 2.14(H) 1.72(H) 1.82(H)  Sodium 135 - 145 mmol/L 135 139 138  Potassium 3.5 - 5.1 mmol/L 4.5 4.6 4.6  Chloride 98 - 111 mmol/L 102 105 104  CO2 22 - 32 mmol/L '23 30 26  '$ Calcium 8.9 - 10.3 mg/dL 9.4 9.1 8.5(L)  Total Protein 6.5 - 8.1 g/dL 7.2 - -  Total Bilirubin 0.3 - 1.2 mg/dL 0.5 - -  Alkaline Phos 38 - 126  U/L 35(L) - -  AST 15 - 41 U/L 19 - -  ALT 0 - 44 U/L 16 - -    Imaging studies:  EXAM: CT ABDOMEN AND PELVIS WITHOUT CONTRAST   TECHNIQUE: Multidetector CT imaging of the abdomen and pelvis was performed following the standard protocol without IV contrast.    COMPARISON:  CT abdomen pelvis dated 11/21/2020.   FINDINGS: Evaluation of this exam is limited in the absence of intravenous contrast.   Lower chest: The visualized lung bases are clear. There is coronary vascular calcification primarily involving the LAD.   No intra-abdominal free air or free fluid.   Hepatobiliary: Multiple liver cyst similar to prior CT. No intrahepatic biliary dilatation. The gallbladder is unremarkable.   Pancreas: Unremarkable. No pancreatic ductal dilatation or surrounding inflammatory changes.   Spleen: Normal in size without focal abnormality.   Adrenals/Urinary Tract: The adrenal glands are unremarkable. There is no hydronephrosis or nephrolithiasis. Probable small parapelvic cysts. The visualized ureters and urinary bladder appear unremarkable.   Stomach/Bowel: There is severe sigmoid diverticulosis and scattered colonic diverticula without active inflammatory changes. There is herniation of loop of distal small bowel into the right inguinal canal. There is inflammatory changes of the herniated loop of small bowel with small amount of fluid within the right inguinal canal. There is narrowing of the bowel at the neck of the hernia with mild dilatation of the proximal loop measuring up to 3.3 cm in diameter. Findings consistent with a small-bowel obstruction secondary to right inguinal hernia. The appendix is normal.   Vascular/Lymphatic: Moderate aortoiliac atherosclerotic disease. The IVC is unremarkable. No portal venous gas. There is no adenopathy.   Reproductive: The prostate and seminal vesicles are grossly unremarkable. No pelvic mass.   Other: None   Musculoskeletal: Degenerative changes of the spine. No acute osseous pathology.   IMPRESSION: 1. Small-bowel obstruction secondary to right inguinal hernia. 2. Colonic diverticulosis. 3. Aortic Atherosclerosis (ICD10-I70.0).     Electronically Signed   By: Anner Crete M.D.    On: 05/29/2021 00:58  Assessment/Plan:  80 y.o. male with incarcerated right inguinal hernia, complicated by pertinent comorbidities including coronary artery disease, hypertension.  Patient with incarcerated right inguinal hernia.  I personally tried to reduce the hernia bedside unable to be reduced.  Due to the fact that CT scan shows small bowel in the hernia and it was unable to be reduced despite given fentanyl and ice pack I think that is reasonable to proceed with emergent inguinal hernia repair.  I will try to do these with minimal invasive approach to be able to inspect the small bowel.  Patient oriented that if small bowel is ischemic he will need small bowel resection.  If there is adequate circulation to the small bowel hernia will be repaired with mesh.  Patient oriented about the risk of surgery that includes injury to bowel, bleeding, infection, bowel obstruction, injury to other organs, among others.  The patient reported he understood and agreed to proceed.  Arnold Long, MD

## 2021-05-29 NOTE — Progress Notes (Signed)
Patient ID: Luis Sweeney, male   DOB: Feb 13, 1941, 80 y.o.   MRN: [id number]     [facility] Day(s): 0.   Interval History: Patient seen and examined, no acute events or new events today.  Patient reports feeling well.  He endorses having some soreness on the surgical area but no significant pain.  He denies any nausea or vomiting.  He denies passing gas.  Vital signs in last 24 hours: [min-max] current  Temp:  [97.1 F (36.2 C)-98.5 F (36.9 C)] 98.5 F (36.9 C) (08/18 1149) Pulse Rate:  [76-81] 81 (08/18 1149) Resp:  [12-22] 14 (08/18 1149) BP: (109-165)/(54-78) 109/59 (08/18 1149) SpO2:  [93 %-100 %] 96 % (08/18 1149) Weight:  [105.7 kg] 105.7 kg (08/18 1100)     Height: '6\' 2"'$  (188 cm) Weight: 105.7 kg BMI (Calculated): 29.9   Physical Exam:  Constitutional: alert, cooperative and no distress  Respiratory: breathing non-labored at rest  Cardiovascular: regular rate and sinus rhythm  Gastrointestinal: soft, non-tender, and non-distended  Labs:  CBC Latest Ref Rng & Units 05/28/2021 12/13/2020 11/23/2020  WBC 4.0 - 10.5 K/uL 8.3 7.0 6.3  Hemoglobin 13.0 - 17.0 g/dL 14.7 13.2 12.1(L)  Hematocrit 39.0 - 52.0 % 42.8 39.1 36.7(L)  Platelets 150 - 400 K/uL 217 237.0 229   CMP Latest Ref Rng & Units 05/28/2021 12/13/2020 11/23/2020  Glucose 70 - 99 mg/dL 158(H) 95 102(H)  BUN 8 - 23 mg/dL 29(H) 25(H) 20  Creatinine 0.61 - 1.24 mg/dL 2.14(H) 1.72(H) 1.82(H)  Sodium 135 - 145 mmol/L 135 139 138  Potassium 3.5 - 5.1 mmol/L 4.5 4.6 4.6  Chloride 98 - 111 mmol/L 102 105 104  CO2 22 - 32 mmol/L '23 30 26  '$ Calcium 8.9 - 10.3 mg/dL 9.4 9.1 8.5(L)  Total Protein 6.5 - 8.1 g/dL 7.2 - -  Total Bilirubin 0.3 - 1.2 mg/dL 0.5 - -  Alkaline Phos 38 - 126 U/L 35(L) - -  AST 15 - 41 U/L 19 - -  ALT 0 - 44 U/L 16 - -    Imaging studies: No new pertinent imaging studies   Assessment/Plan:  80 y.o. male with incarcerated inguinal hernia Day of Surgery s/p robotic assisted  laparoscopic inguinal hernia repair, complicated by pertinent comorbidities including coronary artery disease.  Patient recovering adequately.  Pain controlled.  No nausea or vomiting.  Stable vital signs.  We will continue with pain management.  Will advance to full liquids and assess for toleration.  Encourage patient to ambulate.   Arnold Long, MD

## 2021-05-29 NOTE — Transfer of Care (Signed)
Immediate Anesthesia Transfer of Care Note  Patient: Luis Sweeney  Procedure(s) Performed: XI ROBOTIC ASSISTED INGUINAL HERNIA REPAIR WITH MESH (Right: Inguinal) INDOCYANINE GREEN FLUORESCENCE IMAGING (ICG)  Patient Location: PACU  Anesthesia Type:General  Level of Consciousness: sedated and patient cooperative  Airway & Oxygen Therapy: Patient Spontanous Breathing and Patient connected to face mask oxygen  Post-op Assessment: Report given to RN and Post -op Vital signs reviewed and stable  Post vital signs: Reviewed and stable  Last Vitals:  Vitals Value Taken Time  BP    Temp    Pulse    Resp    SpO2      Last Pain:  Vitals:   05/29/21 0207  TempSrc:   PainSc: 8          Complications: No notable events documented.

## 2021-05-29 NOTE — Op Note (Signed)
Preoperative diagnosis: Recurring right incarcerated inguinal hernia.   Postoperative diagnosis: Recurrent right incarcerated inguinal hernia.  Procedure: Robotic assisted Laparoscopic Transabdominal preperitoneal laparoscopic (TAPP) repair of recurrent right incarcerated inguinal hernia.  Anesthesia: GETA  Surgeon: Dr. Windell Moment  Wound Classification: Clean  Indications:  Patient is a 80 y.o. male developed a acute migraine pain on a recurrent right inguinal hernia.  Emergent repair was indicated due to concern of strangulation.  Findings: 1.  Recurrent incarcerated right inguinal hernia identified 2. Vas deferens and cord structures identified and preserved 3. Bard 3D Max mesh used for repair 4.  Initial congestion of the incarcerated loop of bowel   5.  Adequate ICG green perfusion and he had the procedure   6.  No gross contamination 7.  Adequate hemostasis.   Description of procedure: The patient was taken to the operating room and the correct side of surgery was verified. The patient was placed supine with arms tucked at the sides. After obtaining adequate anesthesia, the patient's abdomen was prepped and draped in standard sterile fashion. The patient was placed in the Trendelenburg position. A time-out was completed verifying correct patient, procedure, site, positioning, and implant(s) and/or special equipment prior to beginning this procedure. A Veress needle was placed at the umbilicus and pneumoperitoneum created with insufflation of carbon dioxide to 15 mmHg. After the Veress needle was removed, an 8-mm trocar was placed on epigastric area and the 30 angled laparoscope inserted. Two 8-mm trocars were then placed lateral to the rectus sheath under direct visualization. Both inguinal regions were inspected and the median umbilical ligament, medial umbilical ligament, and lateral umbilical fold were identified.  The robotic arms were docked. The robotic scope was inserted and  the pelvic area anatomy targeted.  Initial infection shows incarcerated small bowel loop in the right inguinal hernia.  The small bowel was reduced and was found to be congested.  It was left in the abdomen comfortable to be reevaluated to see if perfusion improves. The peritoneum was incised with scissors along a line 5 cm above the superior edge of the hernia defect, extending from the median umbilical ligament to the anterior superior iliac spine. The peritoneal flap was mobilized inferiorly using blunt and sharp dissection. The inferior epigastric vessels were exposed and the pubic symphysis was identified. Cooper's ligament was dissected to its junction with the iliac vein. The dissection was continued inferiorly to the iliopubic tract, with care taken to avoid injury to the femoral branch of the genitofemoral nerve and the lateral femoral cutaneous nerve. The cord structures were parietalized. The hernia was identified and reduced by gentle traction.  The indirect hernia sac was noted mobilized from the cord structures and reduced into the peritoneal cavity.  Since no gross contamination was identified, a large piece of mesh was rolled longitudinally into a compact cylinder and passed through a trocar. The cylinder was placed along the inferior aspect of the working space and unrolled into place to completely cover the direct, indirect, and femoral spaces. The mesh was secured into place superiorly to the anterior abdominal wall and inferiorly and medially to Cooper's ligament with absorbable sutures. Care was taken to avoid the inferolateral triangles containing the iliac vessels and genital nerves. The peritoneal flap was closed over the mesh and secured with suture in similar positions of safety.  5 mg of ICG was administrated.  Adequate perfusion of the small bowel was identified.  It was decided not to do a bowel resection. After ensuring adequate hemostasis,  the trocars were removed and the  pneumoperitoneum allowed to escape. The trocar incisions were closed using monocryl and skin adhesive dressings applied.  The patient tolerated the procedure well and was taken to the postanesthesia care unit in stable condition.   Specimen: None  Complications: None  Estimated Blood Loss: 5 mL

## 2021-05-30 DIAGNOSIS — K760 Fatty (change of) liver, not elsewhere classified: Secondary | ICD-10-CM | POA: Diagnosis present

## 2021-05-30 DIAGNOSIS — Z8616 Personal history of COVID-19: Secondary | ICD-10-CM | POA: Diagnosis not present

## 2021-05-30 DIAGNOSIS — I252 Old myocardial infarction: Secondary | ICD-10-CM | POA: Diagnosis not present

## 2021-05-30 DIAGNOSIS — K219 Gastro-esophageal reflux disease without esophagitis: Secondary | ICD-10-CM | POA: Diagnosis present

## 2021-05-30 DIAGNOSIS — K4031 Unilateral inguinal hernia, with obstruction, without gangrene, recurrent: Secondary | ICD-10-CM | POA: Diagnosis present

## 2021-05-30 DIAGNOSIS — Z87891 Personal history of nicotine dependence: Secondary | ICD-10-CM | POA: Diagnosis not present

## 2021-05-30 DIAGNOSIS — Z8719 Personal history of other diseases of the digestive system: Secondary | ICD-10-CM | POA: Diagnosis not present

## 2021-05-30 DIAGNOSIS — Z7982 Long term (current) use of aspirin: Secondary | ICD-10-CM | POA: Diagnosis not present

## 2021-05-30 DIAGNOSIS — E7800 Pure hypercholesterolemia, unspecified: Secondary | ICD-10-CM | POA: Diagnosis present

## 2021-05-30 DIAGNOSIS — K5730 Diverticulosis of large intestine without perforation or abscess without bleeding: Secondary | ICD-10-CM | POA: Diagnosis present

## 2021-05-30 DIAGNOSIS — Z885 Allergy status to narcotic agent status: Secondary | ICD-10-CM | POA: Diagnosis not present

## 2021-05-30 DIAGNOSIS — R1031 Right lower quadrant pain: Secondary | ICD-10-CM | POA: Diagnosis present

## 2021-05-30 DIAGNOSIS — I129 Hypertensive chronic kidney disease with stage 1 through stage 4 chronic kidney disease, or unspecified chronic kidney disease: Secondary | ICD-10-CM | POA: Diagnosis present

## 2021-05-30 DIAGNOSIS — N1832 Chronic kidney disease, stage 3b: Secondary | ICD-10-CM | POA: Diagnosis present

## 2021-05-30 DIAGNOSIS — Z79899 Other long term (current) drug therapy: Secondary | ICD-10-CM | POA: Diagnosis not present

## 2021-05-30 DIAGNOSIS — I700 Atherosclerosis of aorta: Secondary | ICD-10-CM | POA: Diagnosis present

## 2021-05-30 DIAGNOSIS — I2510 Atherosclerotic heart disease of native coronary artery without angina pectoris: Secondary | ICD-10-CM | POA: Diagnosis present

## 2021-05-30 DIAGNOSIS — Z8249 Family history of ischemic heart disease and other diseases of the circulatory system: Secondary | ICD-10-CM | POA: Diagnosis not present

## 2021-05-30 DIAGNOSIS — Z888 Allergy status to other drugs, medicaments and biological substances status: Secondary | ICD-10-CM | POA: Diagnosis not present

## 2021-05-30 DIAGNOSIS — Z841 Family history of disorders of kidney and ureter: Secondary | ICD-10-CM | POA: Diagnosis not present

## 2021-05-30 DIAGNOSIS — Z20822 Contact with and (suspected) exposure to covid-19: Secondary | ICD-10-CM | POA: Diagnosis present

## 2021-05-30 NOTE — Progress Notes (Signed)
Patient ID: Luis Sweeney, male   DOB: [birth year], 80 y.o.   MRN: [id number]     [facility] Day(s): 0.   Interval History: Patient seen and examined, no acute events or new complaints overnight. Patient reports soreness in the right groin. No passing gas or bowel movement. Able to ambulate with PT.  Vital signs in last 24 hours: [min-max] current  Temp:  [97.8 F (36.6 C)-98.6 F (37 C)] 98.5 F (36.9 C) (08/19 1203) Pulse Rate:  [80-101] 83 (08/19 1203) Resp:  [16-18] 16 (08/19 1203) BP: (103-152)/(48-77) 152/71 (08/19 1203) SpO2:  [92 %-98 %] 98 % (08/19 1203)     Height: '6\' 2"'$  (188 cm) Weight: 105.7 kg BMI (Calculated): 29.9   Physical Exam:  Constitutional: alert, cooperative and no distress  Respiratory: breathing non-labored at rest  Cardiovascular: regular rate and sinus rhythm  Gastrointestinal: soft, non-tender, and non-distended  Labs:  CBC Latest Ref Rng & Units 05/28/2021 12/13/2020 11/23/2020  WBC 4.0 - 10.5 K/uL 8.3 7.0 6.3  Hemoglobin 13.0 - 17.0 g/dL 14.7 13.2 12.1(L)  Hematocrit 39.0 - 52.0 % 42.8 39.1 36.7(L)  Platelets 150 - 400 K/uL 217 237.0 229   CMP Latest Ref Rng & Units 05/28/2021 12/13/2020 11/23/2020  Glucose 70 - 99 mg/dL 158(H) 95 102(H)  BUN 8 - 23 mg/dL 29(H) 25(H) 20  Creatinine 0.61 - 1.24 mg/dL 2.14(H) 1.72(H) 1.82(H)  Sodium 135 - 145 mmol/L 135 139 138  Potassium 3.5 - 5.1 mmol/L 4.5 4.6 4.6  Chloride 98 - 111 mmol/L 102 105 104  CO2 22 - 32 mmol/L '23 30 26  '$ Calcium 8.9 - 10.3 mg/dL 9.4 9.1 8.5(L)  Total Protein 6.5 - 8.1 g/dL 7.2 - -  Total Bilirubin 0.3 - 1.2 mg/dL 0.5 - -  Alkaline Phos 38 - 126 U/L 35(L) - -  AST 15 - 41 U/L 19 - -  ALT 0 - 44 U/L 16 - -    Imaging studies: No new pertinent imaging studies   Assessment/Plan:  80 y.o. male with incarcerated inguinal hernia POD #1 s/p robotic assisted laparoscopic inguinal hernia repair, complicated by pertinent comorbidities including coronary artery  disease.  Stable vital signs.  No fever.  Patient still not passing gas. Will not advance diet until there is better bowel function. Patient is able to ambulate with PT. Will continue with pain management. Will re evaluate in the morning to see if bowel function returns.   Arnold Long, MD

## 2021-05-30 NOTE — Progress Notes (Signed)
Physical Therapy Treatment Patient Details Name: Luis Sweeney MRN: [id number] DOB: [birth year] Today's Date: 05/30/2021    History of Present Illness Pt is an 80 yo M diagnosed with recurring right incarcerated inguinal hernia and is s/p laparoscopic repair.  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    PT Comments    Agrees to session.  Bed mobility did require some increased assist today.  Mod assist which he attributed to general soreness at incision.  Has a lift chair at home that he plans to sleep in upon discharge for ease and comfort.  He is able to walk to bathroom to void then complete 2 laps on unit with generally steady gait.     Follow Up Recommendations        Equipment Recommendations  None recommended by PT    Recommendations for Other Services       Precautions / Restrictions Precautions Precautions: Fall Restrictions Weight Bearing Restrictions: No    Mobility  Bed Mobility Overal bed mobility: Needs Assistance Bed Mobility: Supine to Sit;Sit to Supine     Supine to sit: Mod assist Sit to supine: Mod assist   General bed mobility comments: HOB elevated using bed rails, will sleep in recliner/lift function at home    Transfers Overall transfer level: Needs assistance Equipment used: None;1 person hand held assist Transfers: Sit to/from Stand Sit to Stand: Min assist         General transfer comment: took more assist today due to pain  Ambulation/Gait Ambulation/Gait assistance: Supervision Gait Distance (Feet): 350 Feet Assistive device: None Gait Pattern/deviations: Step-through pattern;Wide base of support;Drifts right/left Gait velocity: decreased   General Gait Details: 2 laps without AD and to bathroom/in room   Stairs             Wheelchair Mobility    Modified Rankin (Stroke Patients Only)       Balance Overall balance assessment: Needs  assistance Sitting-balance support: Feet supported;No upper extremity supported Sitting balance-Leahy Scale: Good Sitting balance - Comments: some imbalance today due to pain   Standing balance support: No upper extremity supported Standing balance-Leahy Scale: Good                              Cognition Arousal/Alertness: Awake/alert Behavior During Therapy: WFL for tasks assessed/performed Overall Cognitive Status: Within Functional Limits for tasks assessed                                        Exercises Other Exercises Other Exercises: to commode to void.  did not pass gas  but did burp some    General Comments        Pertinent Vitals/Pain Pain Assessment: Faces Faces Pain Scale: Hurts even more Pain Location: abdominal Pain Descriptors / Indicators: Aching;Sore Pain Intervention(s): Limited activity within patient's tolerance;Monitored during session;Repositioned    Home Living                      Prior Function            PT Goals (current goals can now be found in the care plan section) Progress towards PT goals: Progressing toward goals    Frequency    Min 2X/week      PT Plan  Current plan remains appropriate    Co-evaluation              AM-PAC PT "6 Clicks" Mobility   Outcome Measure  Help needed turning from your back to your side while in a flat bed without using bedrails?: None Help needed moving from lying on your back to sitting on the side of a flat bed without using bedrails?: A Little Help needed moving to and from a bed to a chair (including a wheelchair)?: A Little Help needed standing up from a chair using your arms (e.g., wheelchair or bedside chair)?: None Help needed to walk in hospital room?: A Little Help needed climbing 3-5 steps with a railing? : A Little 6 Click Score: 20    End of Session Equipment Utilized During Treatment: Gait belt Activity Tolerance: Patient tolerated  treatment well Patient left: in bed;with bed alarm set;with call bell/phone within reach Nurse Communication: Mobility status PT Visit Diagnosis: Unsteadiness on feet (R26.81);Pain     Time: 0230-0254 PT Time Calculation (min) (ACUTE ONLY): 24 min  Charges:  $Gait Training: 23-37 mins                    Chesley Noon, PTA 05/30/21, 3:22 PM , 3:21 PM

## 2021-05-31 LAB — CBC
HCT: 40 % (ref 39.0–52.0)
Hemoglobin: 13.2 g/dL (ref 13.0–17.0)
MCH: 30.9 pg (ref 26.0–34.0)
MCHC: 33 g/dL (ref 30.0–36.0)
MCV: 93.7 fL (ref 80.0–100.0)
Platelets: 203 10*3/uL (ref 150–400)
RBC: 4.27 MIL/uL (ref 4.22–5.81)
RDW: 13.5 % (ref 11.5–15.5)
WBC: 10.3 10*3/uL (ref 4.0–10.5)
nRBC: 0 % (ref 0.0–0.2)

## 2021-05-31 MED ORDER — TRAMADOL HCL 50 MG PO TABS: 50.0000 mg | ORAL_TABLET | Freq: Four times a day (QID) | ORAL | 0 refills | Status: DC | PRN

## 2021-05-31 NOTE — Discharge Summary (Signed)
  Patient ID: Luis Sweeney MRN: [id number] DOB/AGE: 06/04/1941 80 y.o.  Admit date: 05/29/2021 Discharge date: 05/31/2021   Discharge Diagnoses:  Active Problems:   Incarcerated inguinal hernia, unilateral   Procedures: Robotic assisted laparoscopic recurrent right inguinal hernia repair  Hospital Course: Patient with incarcerated recurrent right inguinal hernia with concern of strangulation.  He was taken emergently to the operating room.  Upon evaluation of the incarcerated small bowel he was found with adequate perfusion and peristalsis.  Hernia repair was done.  Patient has been recovering slowly but adequately.  Today the patient is passing gas, tolerating diet, pain control with some soreness on the right groin.  Patient is ambulating.  Consults: Physical therapy  Disposition: Discharge disposition: 01-Home or Self Care        Allergies as of 05/31/2021       Reactions   Prednisone Other (See Comments)   Feels really bad   Codeine Hives, Itching   Lipitor [atorvastatin] Other (See Comments)   Reaction:  Muscle pain    Morphine Hives, Itching   Simvastatin Other (See Comments)   Reaction:  Muscle pain         Medication List     TAKE these medications    aspirin EC 81 MG tablet Take 81 mg by mouth daily.   lovastatin 40 MG tablet Commonly known as: MEVACOR TAKE 1 TABLET BY MOUTH EVERY MONDAY, WEDNESDAY, AND FRIDAY   pantoprazole 40 MG tablet Commonly known as: PROTONIX Take 40 mg by mouth as needed.   ramipril 5 MG capsule Commonly known as: ALTACE TAKE 1 CAPSULE BY MOUTH TWICE DAILY   traMADol 50 MG tablet Commonly known as: Ultram Take 1 tablet (50 mg total) by mouth every 6 (six) hours as needed.   vitamin B-12 1000 MCG tablet Commonly known as: CYANOCOBALAMIN Take 1,000 mcg by mouth daily.   Vitamin D 125 MCG (5000 UT) Caps Take 1 capsule by mouth once a week.        Follow-up Information     Herbert Pun, MD Follow up in 2  week(s).   Specialty: General Surgery Contact information: [address] [place] [address] [phone]

## 2021-05-31 NOTE — Final Progress Note (Signed)
Physician Final Progress Note  Patient ID: Luis Sweeney MRN: [id number] DOB/AGE: [birth year] 80 y.o.  Admit date: 05/29/2021 Admitting provider: Herbert Pun, MD Discharge date: 05/31/2021   Admission Diagnoses: Incarcerated recurrent right inguinal hernia  Discharge Diagnoses:  Active Problems:   Incarcerated inguinal hernia, unilateral    Consults:  Physical therapy  Significant Findings/ Diagnostic Studies: radiology: CT scan: Shows incarcerated recurrent right inguinal hernia with obstruction  Procedures: Robotic assisted laparoscopic right Repair  Discharge Condition: good  Disposition: Discharge disposition: 01-Home or Self Care       Diet: Encourage fluids and soft diet  Discharge Activity: No heavy lifting for 2 weeks   Allergies as of 05/31/2021       Reactions   Prednisone Other (See Comments)   Feels really bad   Codeine Hives, Itching   Lipitor [atorvastatin] Other (See Comments)   Reaction:  Muscle pain    Morphine Hives, Itching   Simvastatin Other (See Comments)   Reaction:  Muscle pain         Medication List     TAKE these medications    aspirin EC 81 MG tablet Take 81 mg by mouth daily.   lovastatin 40 MG tablet Commonly known as: MEVACOR TAKE 1 TABLET BY MOUTH EVERY MONDAY, WEDNESDAY, AND FRIDAY   pantoprazole 40 MG tablet Commonly known as: PROTONIX Take 40 mg by mouth as needed.   ramipril 5 MG capsule Commonly known as: ALTACE TAKE 1 CAPSULE BY MOUTH TWICE DAILY   traMADol 50 MG tablet Commonly known as: Ultram Take 1 tablet (50 mg total) by mouth every 6 (six) hours as needed.   vitamin B-12 1000 MCG tablet Commonly known as: CYANOCOBALAMIN Take 1,000 mcg by mouth daily.   Vitamin D 125 MCG (5000 UT) Caps Take 1 capsule by mouth once a week.        Follow-up Information     Herbert Pun, MD Follow up in 2 week(s).   Specialty: General Surgery Contact information: [address] [place] [address] [phone]                 Total time spent taking care of this patient: 30 minutes  Signed: Herbert Pun 05/31/2021, 11:43 AM

## 2021-05-31 NOTE — Discharge Instructions (Signed)

## 2021-06-09 NOTE — Anesthesia Postprocedure Evaluation (Signed)
Anesthesia Post Note  Patient: Luis Sweeney  Procedure(s) Performed: XI ROBOTIC ASSISTED INGUINAL HERNIA REPAIR WITH MESH (Right: Inguinal) INDOCYANINE GREEN FLUORESCENCE IMAGING (ICG)  Patient location during evaluation: PACU Anesthesia Type: General Level of consciousness: awake and alert Pain management: pain level controlled Vital Signs Assessment: post-procedure vital signs reviewed and stable Respiratory status: spontaneous breathing, nonlabored ventilation, respiratory function stable and patient connected to nasal cannula oxygen Cardiovascular status: blood pressure returned to baseline and stable Postop Assessment: no apparent nausea or vomiting Anesthetic complications: no   No notable events documented.   Last Vitals:  Vitals:   05/31/21 0753 05/31/21 1136  BP: 139/65 136/67  Pulse: 88 87  Resp: 15 20  Temp: 36.6 C 36.7 C  SpO2: 94% 94%    Last Pain:  Vitals:   05/31/21 1000  TempSrc:   PainSc: 0-No pain                 Molli Barrows

## 2021-07-25 ENCOUNTER — Other Ambulatory Visit: Payer: Self-pay | Admitting: Cardiovascular Disease

## 2021-09-09 ENCOUNTER — Telehealth: Payer: Self-pay | Admitting: Family Medicine

## 2021-09-09 NOTE — Chronic Care Management (AMB) (Signed)
  Chronic Care Management   Note  09/09/2021 Name: Luis Sweeney MRN: [id number] DOB: [birth year]  Luis Sweeney is a 80 y.o. year old male who is a primary care patient of Ria Bush, MD. I reached out to [name] by phone today in response to a referral sent by Mr. [name] PCP, Ria Bush, MD.   [name] was given information about Chronic Care Management services today including:  CCM service includes personalized support from designated clinical staff supervised by his physician, including individualized plan of care and coordination with other care providers 24/7 contact phone numbers for assistance for urgent and routine care needs. Service will only be billed when office clinical staff spend 20 minutes or more in a month to coordinate care. Only one practitioner may furnish and bill the service in a calendar month. The patient may stop CCM services at any time (effective at the end of the month) by phone call to the office staff.   Patient agreed to services and verbal consent obtained.   Follow up plan:   Tatjana Secretary/administrator

## 2021-10-14 DIAGNOSIS — H43813 Vitreous degeneration, bilateral: Secondary | ICD-10-CM | POA: Diagnosis not present

## 2021-10-14 DIAGNOSIS — H2513 Age-related nuclear cataract, bilateral: Secondary | ICD-10-CM | POA: Diagnosis not present

## 2021-10-16 ENCOUNTER — Encounter: Payer: Self-pay | Admitting: Ophthalmology

## 2021-10-17 ENCOUNTER — Telehealth: Payer: Self-pay

## 2021-10-17 NOTE — Chronic Care Management (AMB) (Signed)
Chronic Care Management Pharmacy Assistant   Name: Luis Sweeney  MRN: [id number] DOB: 1941/08/13  Luis Sweeney is an 81 y.o. year old male who presents for his initial CCM visit with the clinical pharmacist.  Reason for Encounter: Initial Questions   Conditions to be addressed/monitored: HTN, HLD, DMII, and CKD Stage 3   Recent office visits:  None in 6 months  Recent consult visits:  05/28/21-Physical Therapy- no data found 05/14/21-Neurosurgery- Mallie Mussel Pool,MD-no data found 05/09/21-Cardiology-Orders only for echocardiogram  Hospital visits:  Medication Reconciliation was completed by comparing discharge summary, patients EMR and Pharmacy list, and upon discussion with patient.  Admitted to the hospital on 05/29/21 due to inguinal hernia right. Discharge date was 05/31/21. Discharged from [facility].    New?Medications Started at [facility] Discharge:?? -started Tramadol  due to surgical pain   Medications that remain the same after Hospital Discharge:??  -All other medications will remain the same.    Medications: Outpatient Encounter Medications as of 10/17/2021  Medication Sig Note   aspirin EC 81 MG tablet Take 81 mg by mouth daily.    Cholecalciferol (VITAMIN D) 125 MCG (5000 UT) CAPS Take 1 capsule by mouth once a week. 05/29/2021: Takes on sundays   lovastatin (MEVACOR) 40 MG tablet TAKE 1 TABLET BY MOUTH EVERY MONDAY, WEDNESDAY, AND FRIDAY    pantoprazole (PROTONIX) 40 MG tablet Take 40 mg by mouth as needed.    ramipril (ALTACE) 5 MG capsule TAKE 1 CAPSULE BY MOUTH TWICE DAILY    traMADol (ULTRAM) 50 MG tablet Take 1 tablet (50 mg total) by mouth every 6 (six) hours as needed. (Patient not taking: Reported on 10/16/2021)    vitamin B-12 (CYANOCOBALAMIN) 1000 MCG tablet Take 1,000 mcg by mouth daily.    No facility-administered encounter medications on file as of 10/17/2021.     Lab Results  Component Value Date/Time   HGBA1C 6.5 01/22/2020 09:30 AM   HGBA1C 6.7  (H) 05/22/2019 10:40 AM   MICROALBUR <0.7 02/20/2016 08:49 AM   MICROALBUR 0.2 06/10/2009 09:22 AM     BP Readings from Last 3 Encounters:  05/31/21 136/67  03/26/21 126/66  01/15/21 (!) 108/56    Patient contacted to review initial questions prior to visit with Charlene Brooke.  Have you seen any other providers since your last visit with PCP? Yes Cardiology, neurosurgey,general surgery.  Any changes in your medications or health? No  Any side effects from any medications? No  Do you have an symptoms or problems not managed by your medications? No  Any concerns about your health right now? No  Has your provider asked that you check blood pressure, blood sugar, or follow special diet at home? Patient states he does not have hypertension.   Do you get any type of exercise on a regular basis? No  Can you think of a goal you would like to reach for your health? No  Do you have any problems getting your medications? [place] is good to the patient.  Is there anything that you would like to discuss during the appointment? No   Spoke with patient and reminded them to have all medications, supplements and any blood glucose and blood pressure readings available for review with pharmacist, at their telephone visit on 10/23/21 at 11:00am.   Star Rating Drugs:  Medication:  Last Fill: Day Supply Lovastatin 40mg  03/17/21  90 Ramipril 5mg   07/25/21 90   Care Gaps: Annual wellness visit in last year? Yes Most  Recent BP reading:126/66  64-P 03/26/21  If Diabetic: Most recent A1C reading:6.5  01/22/20 Last eye exam / retinopathy screening:2010 Last diabetic foot exam:2018  Marjo Bicker, CPP notified  Avel Sensor, [place] Assistant [phone]  Total time spent for month CPA: 40 min.

## 2021-10-23 ENCOUNTER — Other Ambulatory Visit: Payer: Self-pay

## 2021-10-23 ENCOUNTER — Ambulatory Visit (INDEPENDENT_AMBULATORY_CARE_PROVIDER_SITE_OTHER): Payer: PPO | Admitting: Pharmacist

## 2021-10-23 DIAGNOSIS — E118 Type 2 diabetes mellitus with unspecified complications: Secondary | ICD-10-CM

## 2021-10-23 DIAGNOSIS — N1832 Chronic kidney disease, stage 3b: Secondary | ICD-10-CM

## 2021-10-23 DIAGNOSIS — K219 Gastro-esophageal reflux disease without esophagitis: Secondary | ICD-10-CM

## 2021-10-23 DIAGNOSIS — E1169 Type 2 diabetes mellitus with other specified complication: Secondary | ICD-10-CM

## 2021-10-23 DIAGNOSIS — E785 Hyperlipidemia, unspecified: Secondary | ICD-10-CM

## 2021-10-23 DIAGNOSIS — I2510 Atherosclerotic heart disease of native coronary artery without angina pectoris: Secondary | ICD-10-CM

## 2021-10-23 DIAGNOSIS — I509 Heart failure, unspecified: Secondary | ICD-10-CM

## 2021-10-23 DIAGNOSIS — H2511 Age-related nuclear cataract, right eye: Secondary | ICD-10-CM | POA: Diagnosis not present

## 2021-10-23 DIAGNOSIS — I10 Essential (primary) hypertension: Secondary | ICD-10-CM

## 2021-10-23 NOTE — Progress Notes (Signed)
Chronic Care Management Pharmacy Note  10/23/2021 Name:  Luis Sweeney MRN:  [id number] DOB:  1941/01/31  Summary: -Pt endorses compliance with medications except he does occasionally miss doses of lovastatin (prescribed 3x weekly) -Pt is due for PCP visit, repeat labwork (A1c, lipids, renal function) -Counseled on CKD and importance of hydration/BP control  Recommendations/Changes made from today's visit: -Advised to use a pill box to help with 3x weekly lovastatin -Advised to schedule annual PCP visit  Plan: -[facility] will call patient 2 months for general adherence -Pharmacist follow up televisit scheduled for 6 months   Subjective: Luis Sweeney is an 81 y.o. year old male who is a primary patient of Ria Bush, MD.  The CCM team was consulted for assistance with disease management and care coordination needs.    Engaged with patient by telephone for initial visit in response to provider referral for pharmacy case management and/or care coordination services.   Consent to Services:  The patient was given the following information about Chronic Care Management services today, agreed to services, and gave verbal consent: 1. CCM service includes personalized support from designated clinical staff supervised by the primary care provider, including individualized plan of care and coordination with other care providers 2. 24/7 contact phone numbers for assistance for urgent and routine care needs. 3. Service will only be billed when office clinical staff spend 20 minutes or more in a month to coordinate care. 4. Only one practitioner may furnish and bill the service in a calendar month. 5.The patient may stop CCM services at any time (effective at the end of the month) by phone call to the office staff. 6. The patient will be responsible for cost sharing (co-pay) of up to 20% of the service fee (after annual deductible is met). Patient agreed to services and consent  obtained.  Patient Care Team: Ria Bush, MD as PCP - General (Family Medicine) Sherren Mocha, MD as PCP - Cardiology (Cardiology) Dingeldein, Remo Lipps, MD as Consulting Physician (Ophthalmology) Sherren Mocha, MD as Consulting Physician (Cardiology) Karie Chimera, MD as Consulting Physician (Neurosurgery) Sharmon Revere as Physician Assistant (Cardiology) Charlton Haws, [facility] as Pharmacist (Pharmacist)  Recent office visits: 03/20/21 Andrez Grime LPN  - AWV 5/0/35 Dr Danise Mina OV: c/o dysuria s/p treatment at urgent care which returned negative culture. UA negative. No med changes.  Recent consult visits: 05/28/21-Physical Therapy 05/14/21-Dr Earnie Larsson (Neurosurgery): f/u lumbar radiculopathy 05/09/21-Cardiology-Orders only for echocardiogram 05/01/21 Dr Annette Stable (Neurosurgery): f/u lumbar radiculopathy 03/26/21 PA Richardson Dopp (cardiology): surgical clearance for hernia repair. Ordered stress test. 01/15/21 Dr [name] (GI): f/u SBO, resolved. Advised hernia repair is elective and requires surgical clearance.  Hospital visits: Medication Reconciliation was completed by comparing discharge summary, patients EMR and Pharmacy list, and upon discussion with patient.   Admitted to the hospital on 05/29/21 due to inguinal hernia right. Discharge date was 05/31/21. Discharged from [place] repair surgery 8/18   New?Medications Started at [facility] Discharge:?? -started Tramadol  due to surgical pain    Medications that remain the same after Hospital Discharge:??  -All other medications will remain the same.    Admission 11/21/20-11/23/20 for SBO.  Objective:  Lab Results  Component Value Date   CREATININE 2.14 (H) 05/28/2021   BUN 29 (H) 05/28/2021   GFR 37.18 (L) 12/13/2020   GFRNONAA 31 (L) 05/28/2021   GFRAA 46 (L) 09/29/2018   NA 135 05/28/2021   K 4.5 05/28/2021   CALCIUM 9.4  05/28/2021   CO2 23 05/28/2021   GLUCOSE 158 (H) 05/28/2021     Lab Results  Component Value Date/Time   HGBA1C 6.5 01/22/2020 09:30 AM   HGBA1C 6.7 (H) 05/22/2019 10:40 AM   GFR 37.18 (L) 12/13/2020 11:32 AM   GFR 37.03 (L) 01/22/2020 09:30 AM   MICROALBUR <0.7 02/20/2016 08:49 AM   MICROALBUR 0.2 06/10/2009 09:22 AM    Last diabetic Eye exam:  Lab Results  Component Value Date/Time   HMDIABEYEEXA Done 03/27/2009 12:00 AM    Last diabetic Foot exam: No results found for: HMDIABFOOTEX   Lab Results  Component Value Date   CHOL 115 01/22/2020   HDL 37.50 (L) 01/22/2020   LDLCALC 70 01/22/2020   TRIG 35.0 01/22/2020   CHOLHDL 3 01/22/2020    Hepatic Function Latest Ref Rng & Units 05/28/2021 11/22/2020 11/21/2020  Total Protein 6.5 - 8.1 g/dL 7.2 5.9(L) 8.2(H)  Albumin 3.5 - 5.0 g/dL 4.1 3.1(L) 4.4  AST 15 - 41 U/L '19 17 27  ' ALT 0 - 44 U/L '16 13 17  ' Alk Phosphatase 38 - 126 U/L 35(L) 25(L) 33(L)  Total Bilirubin 0.3 - 1.2 mg/dL 0.5 0.6 1.0  Bilirubin, Direct 0.0 - 0.2 mg/dL 0.1 - -    Lab Results  Component Value Date/Time   TSH 2.10 04/16/2011 08:26 AM   TSH 2.19 01/04/2008 08:47 AM    CBC Latest Ref Rng & Units 05/31/2021 05/28/2021 12/13/2020  WBC 4.0 - 10.5 K/uL 10.3 8.3 7.0  Hemoglobin 13.0 - 17.0 g/dL 13.2 14.7 13.2  Hematocrit 39.0 - 52.0 % 40.0 42.8 39.1  Platelets 150 - 400 K/uL 203 217 237.0    Lab Results  Component Value Date/Time   VD25OH 50.34 01/22/2020 09:30 AM   VD25OH 65.06 12/12/2018 12:23 PM    Clinical ASCVD: Yes  The ASCVD Risk score (Arnett DK, et al., 2019) failed to calculate for the following reasons:   The 2019 ASCVD risk score is only valid for ages 58 to 52   The patient has a prior MI or stroke diagnosis    Depression screen [facility] 2/9 03/20/2021 01/19/2020 12/12/2018  Decreased Interest 0 0 0  Down, Depressed, Hopeless 0 0 0  PHQ - 2 Score 0 0 0  Altered sleeping 0 0 -  Tired, decreased energy 0 0 -  Change in appetite 0 0 -  Feeling bad or failure about yourself  0 0 -  Trouble concentrating 0  0 -  Moving slowly or fidgety/restless 0 0 -  Suicidal thoughts 0 0 -  PHQ-9 Score 0 0 -  Difficult doing work/chores Not difficult at all Not difficult at all -     Social History   Tobacco Use  Smoking Status Former   Packs/day: 1.50   Years: 3.00   Pack years: 4.50   Types: Cigarettes   Quit date: 02/16/1985   Years since quitting: 36.7  Smokeless Tobacco Never   BP Readings from Last 3 Encounters:  05/31/21 136/67  03/26/21 126/66  01/15/21 (!) 108/56   Pulse Readings from Last 3 Encounters:  05/31/21 87  03/26/21 64  01/15/21 84   Wt Readings from Last 3 Encounters:  05/29/21 233 lb (105.7 kg)  04/17/21 243 lb (110.2 kg)  03/26/21 243 lb (110.2 kg)   BMI Readings from Last 3 Encounters:  05/29/21 29.92 kg/m  04/17/21 31.20 kg/m  03/26/21 31.20 kg/m    Assessment/Interventions: Review of patient past medical history, allergies, medications, health status,  including review of consultants reports, laboratory and other test data, was performed as part of comprehensive evaluation and provision of chronic care management services.   SDOH:  (Social Determinants of Health) assessments and interventions performed: Yes  SDOH Screenings   Alcohol Screen: Low Risk    Last Alcohol Screening Score (AUDIT): 1  Depression (PHQ2-9): Low Risk    PHQ-2 Score: 0  Financial Resource Strain: Low Risk    Difficulty of Paying Living Expenses: Not hard at all  Food Insecurity: No Food Insecurity   Worried About Charity fundraiser in the Last Year: Never true   Ran Out of Food in the Last Year: Never true  Housing: Low Risk    Last Housing Risk Score: 0  Physical Activity: Inactive   Days of Exercise per Week: 0 days   Minutes of Exercise per Session: 0 min  Social Connections: Not on file  Stress: No Stress Concern Present   Feeling of Stress : Not at all  Tobacco Use: Medium Risk   Smoking Tobacco Use: Former   Smokeless Tobacco Use: Never   Passive Exposure: Not on  file  Transportation Needs: No Transportation Needs   Lack of Transportation (Medical): No   Lack of Transportation (Non-Medical): No    CCM Care Plan  Allergies  Allergen Reactions   Prednisone Other (See Comments)    Feels really bad   Codeine Hives and Itching   Lipitor [Atorvastatin] Other (See Comments)    Reaction:  Muscle pain    Morphine Hives and Itching   Simvastatin Other (See Comments)    Reaction:  Muscle pain     Medications Reviewed Today     Reviewed by Charlton Haws, [facility] (Pharmacist) on 10/23/21 at 1133  Med List Status: <None>   Medication Order Taking? Sig Documenting Provider Last Dose Status Informant  aspirin EC 81 MG tablet [id number] Yes Take 81 mg by mouth daily. [provider] Taking Active Self  Cholecalciferol (VITAMIN D) 125 MCG (5000 UT) CAPS [id number] Yes Take 1 capsule by mouth once a week. Ria Bush, MD Taking Active Self           Med Note Charlyne Mom May 29, 2021  2:09 AM) Dewaine Conger on sundays  lovastatin (MEVACOR) 40 MG tablet [id number] Yes TAKE 1 TABLET BY MOUTH EVERY MONDAY, WEDNESDAY, AND Roby Lofts, MD Taking Active Self  pantoprazole (PROTONIX) 40 MG tablet [id number] Yes Take 40 mg by mouth as needed. [provider] Taking Active Self  ramipril (ALTACE) 5 MG capsule [id number] Yes TAKE 1 CAPSULE BY MOUTH TWICE DAILY Sherren Mocha, MD Taking Active   vitamin B-12 (CYANOCOBALAMIN) 1000 MCG tablet [id number] Yes Take 1,000 mcg by mouth daily. [provider] Taking Active Self            Patient Active Problem List   Diagnosis Date Noted   Incarcerated inguinal hernia, unilateral 05/29/2021   Dilatation of thoracic aorta ([place]) 05/11/2021   CHF (congestive heart failure) ([place]) 05/11/2021   SBO (small bowel obstruction) ([place]) 11/21/2020   Acute renal failure superimposed on stage 3 chronic kidney disease ([place]) 11/21/2020   Hyperkalemia 11/21/2020   Leukocytosis  11/21/2020   Primary osteoarthritis of right hip 06/19/2020   Acute left-sided low back pain with left-sided sciatica 05/17/2019   Essential hypertension 09/27/2018   Health maintenance examination 02/25/2016   Controlled diabetes mellitus type 2 with complications ([place]) 10/04/4974   Irregular heart beat 02/25/2016  Advanced care planning/counseling discussion 02/20/2015   Obesity, Class I, BMI 30-34.9 02/20/2015   Duodenal stricture - recurrent 09/21/2014   Hx of peptic ulcer 06/11/2014   NAFLD (nonalcoholic fatty liver disease)    Medicare annual wellness visit, subsequent 05/31/2012   Vitamin B12 deficiency 05/31/2012   Recurrent right inguinal hernia 05/31/2012   Dysuria 11/26/2010   Coronary atherosclerosis of native coronary artery 03/13/2009   CKD stage 3 due to type 2 diabetes mellitus ([place]) 03/13/2009   Hyperlipidemia associated with type 2 diabetes mellitus ([place]) 01/04/2008   History of colonic polyps 11/07/2007   GERD 03/08/2007   MYOCARDIAL INFARCTION, HX OF 03/07/2007   ERB'S PALSY 03/07/2007    Immunization History  Administered Date(s) Administered   Influenza Whole 07/10/2008   Moderna Sars-Covid-2 Vaccination 12/11/2019, 01/08/2020   Pneumococcal Conjugate-13 02/20/2015   Pneumococcal Polysaccharide-23 10/12/2002, 07/10/2008   Td 11/13/2000, 05/31/2012    Conditions to be addressed/monitored:  Hypertension, Hyperlipidemia, Diabetes, Heart Failure, Coronary Artery Disease, GERD, and Chronic Kidney Disease  Care Plan : [place]  Updates made by Charlton Haws, [place] since 10/23/2021 12:00 AM     Problem: Hypertension, Hyperlipidemia, Diabetes, Heart Failure, Coronary Artery Disease, GERD, and Chronic Kidney Disease   Priority: High     Long-Range Goal: Disease mgmt   Start Date: 10/23/2021  Expected End Date: 10/23/2022  This Visit's Progress: On track  Priority: High  Note:   Current Barriers:  Unable to independently monitor  therapeutic efficacy  Pharmacist Clinical Goal(s):  Patient will achieve adherence to monitoring guidelines and medication adherence to achieve therapeutic efficacy through collaboration with PharmD and provider.   Interventions: 1:1 collaboration with Ria Bush, MD regarding development and update of comprehensive plan of care as evidenced by provider attestation and co-signature Inter-disciplinary care team collaboration (see longitudinal plan of care) Comprehensive medication review performed; medication list updated in electronic medical record  Hypertension / Heart Failure (BP goal <130/80) -Controlled - pt is not checking BP at home, BP is office visits has been at goal -Last ejection fraction: 45-50% (Date: 05/06/21) -HF type: Left Ventricular Failure, Grade 1 diastolic dysfunction -Current home BP readings: n/a -Current treatment: Ramipril 5 mg BID -Appropriate, Effective, Safe, Accessible -Medications previously tried: carvedilol  -Denies hypotensive/hypertensive symptoms -Educated on BP goals and benefits of medications for prevention of heart attack, stroke and kidney damage;Daily salt intake goal < 2300 mg; -Educated on Benefits of medications for managing symptoms and prolonging life -Counseled to monitor BP at home periodically -Recommended to continue current medication  Hyperlipidemia / CAD (LDL goal < 70) -Unable to assess- LDL was at goal 01/2020 but has not been checked since; pt endorses compliance with statin TIW, when asked about last fill date 03/2021 he admits he does occasionally miss doses -Hx MI 2004 s/p DES; cath 06/2016 w/ patent stent -Current treatment: Lovastatin 40 mg MWF - Appropriate, Effective, Safe, Accessible Aspirin 81 mg daily - Appropriate, Effective, Safe, Accessible -Medications previously tried: rosuvastatin 5, pravastatin, simvastatin, atorvastatin -Educated on Cholesterol goals; Benefits of statin for ASCVD risk reduction; -Recommended  to continue current medication; repeat lipid panel due and pt is due for annual PCP visit, advised he call office to schedule appt  Diabetes (A1c goal <7%) -Diet-controlled; A1c at goal 01/2020, pt is overdue for repeat A1c -Educated on A1c and blood sugar goals; -Counseled on diet and exercise extensively -Pt is due for annual PCP visit, advised he call office to schedule appt  CKD Stage 3b (Goal:  slow progression) -Not ideally controlled - pt endorses limited water intake but does drink a lot of diet pepsi Lab Results  Component Value Date   CREATININE 2.14 (H) 05/28/2021   CREATININE 1.72 (H) 12/13/2020   CREATININE 1.82 (H) 11/23/2020  -Current treatment  Ramipril 5 mg BID -Discussed importance of hydration and BP control to prevent progression of DM -Can consider SGLT2 or Carrington Clamp, pt is not keen on adding new medication especially brand-name -Recommended to continue current medication  GERD (Goal: manage symptoms) -Controlled - has not taken PPI months -Hx SBO 11/2020 and s/p hernia repair 05/2021. -Current treatment  Pantoprazole 40 mg PRN -Recommended to continue current medication PRN  Health Maintenance -Vaccine gaps: Flu, covid booster, Shingrix -Pt declines vaccines -Current OTC:  Vitamin B12 1000 mcg Vitamin D 5000 IU weekly -Patient is satisfied with current therapy and denies issues -Recommended to continue current medication  Patient Goals/Self-Care Activities Patient will:  - take medications as prescribed as evidenced by patient report and record review focus on medication adherence by routine check blood pressure periodically, document, and provide at future appointments engage in dietary modifications by increasing water intake       Medication Assistance: None required.  Patient affirms current coverage meets needs.  Compliance/Adherence/Medication fill history: Care Gaps: Eye exam (due 03/27/10) Foot exam (due 03/03/18) A1c (due  07/23/20)  Star-Rating Drugs: Lovastatin - LF 03/17/21 x 90 ds PDC 51% --pt endorses occasional missed doses of 3x weekly dosing Ramipril - LF 07/25/21 x 90 ds; [place] 100%  Patient's preferred pharmacy is:  [facility] [place] - [address] [place] [address] Phone: [phone] Fax: [phone]  [id number] [place] [id number]: [phone]: [phone] Uses pill box? No -   Pt endorses 90% compliance - misses lovastatin doses occasionally  We discussed: Current pharmacy is preferred with insurance plan and patient is satisfied with pharmacy services Patient decided to: Continue current medication management strategy  Care Plan and Follow Up Patient Decision:  Patient agrees to Care Plan and Follow-up.  Plan: Telephone follow up appointment with care management team member scheduled for:  6 months  LCharlene Brooke PharmD, BCACP Clinical Pharmacist [facility] at [phone]

## 2021-10-23 NOTE — Patient Instructions (Addendum)
Visit Information  Phone number for Pharmacist: [phone]  Thank you for meeting with me to discuss your medications! I look forward to working with you to achieve your health care goals. Below is a summary of what we talked about during the visit:   Goals Addressed             This Visit's Progress    Track and Manage Fluids and Swelling-Heart Failure       Timeframe:  Long-Range Goal Priority:  Medium Start Date:    10/23/21                         Expected End Date:   10/23/22                    Follow Up Date July 2023   - call office if I gain more than 2 pounds in one day or 5 pounds in one week - keep legs up while sitting - use salt in moderation - watch for swelling in feet, ankles and legs every day    Why is this important?   It is important to check your weight daily and watch how much salt and liquids you have.  It will help you to manage your heart failure.    Notes:         Care Plan : [place]  Updates made by Charlton Haws, RPH since 10/23/2021 12:00 AM     Problem: Hypertension, Hyperlipidemia, Diabetes, Heart Failure, Coronary Artery Disease, GERD, and Chronic Kidney Disease   Priority: High     Long-Range Goal: Disease mgmt   Start Date: 10/23/2021  Expected End Date: 10/23/2022  This Visit's Progress: On track  Priority: High  Note:   Current Barriers:  Unable to independently monitor therapeutic efficacy  Pharmacist Clinical Goal(s):  Patient will achieve adherence to monitoring guidelines and medication adherence to achieve therapeutic efficacy through collaboration with PharmD and provider.   Interventions: 1:1 collaboration with Ria Bush, MD regarding development and update of comprehensive plan of care as evidenced by provider attestation and co-signature Inter-disciplinary care team collaboration (see longitudinal plan of care) Comprehensive medication review performed; medication list updated in  electronic medical record  Hypertension / Heart Failure (BP goal <130/80) -Controlled - pt is not checking BP at home, BP is office visits has been at goal -Last ejection fraction: 45-50% (Date: 05/06/21) -HF type: Left Ventricular Failure, Grade 1 diastolic dysfunction -Current home BP readings: n/a -Current treatment: Ramipril 5 mg BID -Appropriate, Effective, Safe, Accessible -Medications previously tried: carvedilol  -Denies hypotensive/hypertensive symptoms -Educated on BP goals and benefits of medications for prevention of heart attack, stroke and kidney damage;Daily salt intake goal < 2300 mg; -Educated on Benefits of medications for managing symptoms and prolonging life -Counseled to monitor BP at home periodically -Recommended to continue current medication  Hyperlipidemia / CAD (LDL goal < 70) -Unable to assess- LDL was at goal 01/2020 but has not been checked since; pt endorses compliance with statin TIW, when asked about last fill date 03/2021 he admits he does occasionally miss doses -Hx MI 2004 s/p DES; cath 06/2016 w/ patent stent -Current treatment: Lovastatin 40 mg MWF - Appropriate, Effective, Safe, Accessible Aspirin 81 mg daily - Appropriate, Effective, Safe, Accessible -Medications previously tried: rosuvastatin 5, pravastatin, simvastatin, atorvastatin -Educated on Cholesterol goals; Benefits of statin for ASCVD risk reduction; -Recommended to continue current medication; repeat lipid panel due and pt  is due for annual PCP visit, advised he call office to schedule appt  Diabetes (A1c goal <7%) -Diet-controlled; A1c at goal 01/2020, pt is overdue for repeat A1c -Educated on A1c and blood sugar goals; -Counseled on diet and exercise extensively -Pt is due for annual PCP visit, advised he call office to schedule appt  CKD Stage 3b (Goal: slow progression) -Not ideally controlled - pt endorses limited water intake but does drink a lot of diet pepsi Lab Results   Component Value Date   CREATININE 2.14 (H) 05/28/2021   CREATININE 1.72 (H) 12/13/2020   CREATININE 1.82 (H) 11/23/2020  -Current treatment  Ramipril 5 mg BID -Discussed importance of hydration and BP control to prevent progression of DM -Can consider SGLT2 or Carrington Clamp, pt is not keen on adding new medication especially brand-name -Recommended to continue current medication  GERD (Goal: manage symptoms) -Controlled - has not taken PPI months -Hx SBO 11/2020 and s/p hernia repair 05/2021. -Current treatment  Pantoprazole 40 mg PRN -Recommended to continue current medication PRN  Health Maintenance -Vaccine gaps: Flu, covid booster, Shingrix -Pt declines vaccines -Current OTC:  Vitamin B12 1000 mcg Vitamin D 5000 IU weekly -Patient is satisfied with current therapy and denies issues -Recommended to continue current medication  Patient Goals/Self-Care Activities Patient will:  - take medications as prescribed as evidenced by patient report and record review focus on medication adherence by routine check blood pressure periodically, document, and provide at future appointments engage in dietary modifications by increasing water intake       Luis Sweeney was given information about Chronic Care Management services today including:  CCM service includes personalized support from designated clinical staff supervised by his physician, including individualized plan of care and coordination with other care providers 24/7 contact phone numbers for assistance for urgent and routine care needs. Standard insurance, coinsurance, copays and deductibles apply for chronic care management only during months in which we provide at least 20 minutes of these services. Most insurances cover these services at 100%, however patients may be responsible for any copay, coinsurance and/or deductible if applicable. This service may help you avoid the need for more expensive face-to-face services. Only one  practitioner may furnish and bill the service in a calendar month. The patient may stop CCM services at any time (effective at the end of the month) by phone call to the office staff.  Patient agreed to services and verbal consent obtained.   The patient verbalized understanding of instructions, educational materials, and care plan provided today and declined offer to receive copy of patient instructions, educational materials, and care plan.  Telephone follow up appointment with pharmacy team member scheduled for: 6 months  Charlene Brooke, PharmD, [facility] Clinical Pharmacist [place] [facility] [phone]

## 2021-10-24 ENCOUNTER — Other Ambulatory Visit: Payer: Self-pay | Admitting: Family Medicine

## 2021-10-26 NOTE — Telephone Encounter (Signed)
Not prescribed by you in the past ok to refill.

## 2021-10-30 NOTE — Discharge Instructions (Signed)

## 2021-11-04 ENCOUNTER — Encounter: Payer: Self-pay | Admitting: Ophthalmology

## 2021-11-04 ENCOUNTER — Ambulatory Visit: Payer: PPO | Admitting: Anesthesiology

## 2021-11-04 ENCOUNTER — Encounter
Admission: RE | Disposition: A | Discharge status: Home / Self Care | Payer: Self-pay | Source: Home / Self Care | Attending: Ophthalmology

## 2021-11-04 ENCOUNTER — Other Ambulatory Visit: Payer: Self-pay

## 2021-11-04 ENCOUNTER — Ambulatory Visit
Admission: RE | Admit: 2021-11-04 | Discharge: 2021-11-04 | Disposition: A | Discharge status: Home / Self Care | Payer: PPO | Attending: Ophthalmology | Admitting: Ophthalmology

## 2021-11-04 DIAGNOSIS — H25811 Combined forms of age-related cataract, right eye: Secondary | ICD-10-CM | POA: Diagnosis not present

## 2021-11-04 DIAGNOSIS — K279 Peptic ulcer, site unspecified, unspecified as acute or chronic, without hemorrhage or perforation: Secondary | ICD-10-CM | POA: Diagnosis not present

## 2021-11-04 DIAGNOSIS — E1136 Type 2 diabetes mellitus with diabetic cataract: Secondary | ICD-10-CM | POA: Diagnosis not present

## 2021-11-04 DIAGNOSIS — M1990 Unspecified osteoarthritis, unspecified site: Secondary | ICD-10-CM | POA: Diagnosis not present

## 2021-11-04 DIAGNOSIS — I130 Hypertensive heart and chronic kidney disease with heart failure and stage 1 through stage 4 chronic kidney disease, or unspecified chronic kidney disease: Secondary | ICD-10-CM | POA: Insufficient documentation

## 2021-11-04 DIAGNOSIS — N189 Chronic kidney disease, unspecified: Secondary | ICD-10-CM | POA: Insufficient documentation

## 2021-11-04 DIAGNOSIS — Z87891 Personal history of nicotine dependence: Secondary | ICD-10-CM | POA: Diagnosis not present

## 2021-11-04 DIAGNOSIS — Z955 Presence of coronary angioplasty implant and graft: Secondary | ICD-10-CM | POA: Diagnosis not present

## 2021-11-04 DIAGNOSIS — K760 Fatty (change of) liver, not elsewhere classified: Secondary | ICD-10-CM | POA: Diagnosis not present

## 2021-11-04 DIAGNOSIS — H2511 Age-related nuclear cataract, right eye: Secondary | ICD-10-CM | POA: Insufficient documentation

## 2021-11-04 DIAGNOSIS — I2510 Atherosclerotic heart disease of native coronary artery without angina pectoris: Secondary | ICD-10-CM | POA: Diagnosis not present

## 2021-11-04 DIAGNOSIS — K449 Diaphragmatic hernia without obstruction or gangrene: Secondary | ICD-10-CM | POA: Diagnosis not present

## 2021-11-04 DIAGNOSIS — I509 Heart failure, unspecified: Secondary | ICD-10-CM | POA: Insufficient documentation

## 2021-11-04 DIAGNOSIS — I252 Old myocardial infarction: Secondary | ICD-10-CM | POA: Insufficient documentation

## 2021-11-04 DIAGNOSIS — K219 Gastro-esophageal reflux disease without esophagitis: Secondary | ICD-10-CM | POA: Diagnosis not present

## 2021-11-04 DIAGNOSIS — E1122 Type 2 diabetes mellitus with diabetic chronic kidney disease: Secondary | ICD-10-CM | POA: Insufficient documentation

## 2021-11-04 HISTORY — PX: CATARACT EXTRACTION W/PHACO: SHX586

## 2021-11-04 SURGERY — PHACOEMULSIFICATION, CATARACT, WITH IOL INSERTION
Anesthesia: Monitor Anesthesia Care | Site: Eye | Laterality: Right

## 2021-11-04 MED ORDER — MIDAZOLAM HCL 2 MG/2ML IJ SOLN
INTRAMUSCULAR | Status: DC | PRN
  Administered 2021-11-04: 1 mg via INTRAVENOUS

## 2021-11-04 MED ORDER — TETRACAINE HCL 0.5 % OP SOLN
1.0000 [drp] | OPHTHALMIC | Status: DC | PRN
  Administered 2021-11-04 (×3): 1 [drp] via OPHTHALMIC

## 2021-11-04 MED ORDER — SIGHTPATH DOSE#1 BSS IO SOLN
INTRAOCULAR | Status: DC | PRN
  Administered 2021-11-04: 12:00:00 63 mL via OPHTHALMIC

## 2021-11-04 MED ORDER — MOXIFLOXACIN HCL 0.5 % OP SOLN
OPHTHALMIC | Status: DC | PRN
  Administered 2021-11-04: 0.2 mL via OPHTHALMIC

## 2021-11-04 MED ORDER — ONDANSETRON HCL 4 MG/2ML IJ SOLN: 4.0000 mg | Freq: Once | INTRAMUSCULAR | Status: DC | PRN

## 2021-11-04 MED ORDER — BRIMONIDINE TARTRATE-TIMOLOL 0.2-0.5 % OP SOLN
OPHTHALMIC | Status: DC | PRN
  Administered 2021-11-04: 1 [drp] via OPHTHALMIC

## 2021-11-04 MED ORDER — FENTANYL CITRATE (PF) 100 MCG/2ML IJ SOLN
INTRAMUSCULAR | Status: DC | PRN
  Administered 2021-11-04: 50 ug via INTRAVENOUS

## 2021-11-04 MED ORDER — ACETAMINOPHEN 160 MG/5ML PO SOLN
325.0000 mg | ORAL | Status: DC | PRN
Start: 2021-11-04 — End: 2021-11-04

## 2021-11-04 MED ORDER — SIGHTPATH DOSE#1 BSS IO SOLN
INTRAOCULAR | Status: DC | PRN
  Administered 2021-11-04: 15 mL

## 2021-11-04 MED ORDER — ACETAMINOPHEN 325 MG PO TABS
325.0000 mg | ORAL_TABLET | ORAL | Status: DC | PRN
Start: 2021-11-04 — End: 2021-11-04

## 2021-11-04 MED ORDER — ARMC OPHTHALMIC DILATING DROPS
1.0000 "application " | OPHTHALMIC | Status: DC | PRN
  Administered 2021-11-04 (×3): 1 via OPHTHALMIC

## 2021-11-04 MED ORDER — SIGHTPATH DOSE#1 NA CHONDROIT SULF-NA HYALURON 40-17 MG/ML IO SOLN
INTRAOCULAR | Status: DC | PRN
  Administered 2021-11-04: 1 mL via INTRAOCULAR

## 2021-11-04 MED ORDER — SIGHTPATH DOSE#1 BSS IO SOLN
INTRAOCULAR | Status: DC | PRN
  Administered 2021-11-04: 1 mL via INTRAMUSCULAR

## 2021-11-04 SURGICAL SUPPLY — 12 items
CATARACT SUITE SIGHTPATH (MISCELLANEOUS) ×3 IMPLANT
FEE CATARACT SUITE SIGHTPATH (MISCELLANEOUS) ×1 IMPLANT
GLOVE SURG ENC TEXT LTX SZ8 (GLOVE) ×3 IMPLANT
GLOVE SURG TRIUMPH 8.0 PF LTX (GLOVE) ×3 IMPLANT
LENS IOL ACRSF IQ VT 15 15.0 IMPLANT
LENS IOL ACRYSOF VIVITY 15.0 ×3 IMPLANT
LENS IOL VIVITY 015 15.0 ×1 IMPLANT
NDL FILTER BLUNT 18X1 1/2 (NEEDLE) ×1 IMPLANT
NEEDLE FILTER BLUNT 18X 1/2SAF (NEEDLE) ×2
NEEDLE FILTER BLUNT 18X1 1/2 (NEEDLE) ×1 IMPLANT
SYR 3ML LL SCALE MARK (SYRINGE) ×3 IMPLANT
WATER STERILE IRR 250ML POUR (IV SOLUTION) ×3 IMPLANT

## 2021-11-04 NOTE — Anesthesia Preprocedure Evaluation (Signed)
Anesthesia Evaluation  Patient identified by MRN, date of birth, ID band Patient awake    Reviewed: Allergy & Precautions, NPO status   Airway Mallampati: II  TM Distance: >3 FB     Dental   Pulmonary former smoker,    Pulmonary exam normal        Cardiovascular hypertension, + CAD, + Past MI, + Cardiac Stents (DES 2004 to proximal LAD) and +CHF   Rhythm:Regular Rate:Normal  Echocardiogram 7/22: EF 45-50, mild LVH, Gr 1 DD, inf and apical HK, normal RVSF, trivial MR, dilated ascending aorta (38 mm)   Neuro/Psych    GI/Hepatic hiatal hernia, PUD, GERD  ,NAFLD   Endo/Other  diabetes  Renal/GU Renal InsufficiencyRenal disease     Musculoskeletal  (+) Arthritis ,   Abdominal   Peds  Hematology   Anesthesia Other Findings   Reproductive/Obstetrics                             Anesthesia Physical Anesthesia Plan  ASA: 3  Anesthesia Plan: MAC   Post-op Pain Management: Minimal or no pain anticipated   Induction: Intravenous  PONV Risk Score and Plan: TIVA, Midazolam and Treatment may vary due to age or medical condition  Airway Management Planned: Natural Airway and Nasal Cannula  Additional Equipment:   Intra-op Plan:   Post-operative Plan:   Informed Consent: I have reviewed the patients History and Physical, chart, labs and discussed the procedure including the risks, benefits and alternatives for the proposed anesthesia with the patient or authorized representative who has indicated his/her understanding and acceptance.       Plan Discussed with: CRNA  Anesthesia Plan Comments:         Anesthesia Quick Evaluation

## 2021-11-04 NOTE — Transfer of Care (Signed)
Immediate Anesthesia Transfer of Care Note  Patient: Luis Sweeney  Procedure(s) Performed: CATARACT EXTRACTION PHACO AND INTRAOCULAR LENS PLACEMENT (IOC) RIGHT VIVITY LENS 7.38 00:56.1 (Right: Eye)  Patient Location: PACU  Anesthesia Type: MAC  Level of Consciousness: awake, alert  and patient cooperative  Airway and Oxygen Therapy: Patient Spontanous Breathing and Patient connected to supplemental oxygen  Post-op Assessment: Post-op Vital signs reviewed, Patient's Cardiovascular Status Stable, Respiratory Function Stable, Patent Airway and No signs of Nausea or vomiting  Post-op Vital Signs: Reviewed and stable  Complications: No notable events documented.

## 2021-11-04 NOTE — Op Note (Signed)
PREOPERATIVE DIAGNOSIS:  Nuclear sclerotic cataract of the right eye.   POSTOPERATIVE DIAGNOSIS:  H25.11 Cataract   OPERATIVE PROCEDURE:ORPROCALL@   SURGEON:  Birder Robson, MD.   ANESTHESIA:  Anesthesiologist: Veda Canning, MD CRNA: Cameron Ali, CRNA  1.      Managed anesthesia care. 2.      0.23ml of Shugarcaine was instilled in the eye following the paracentesis.   COMPLICATIONS:  None.   TECHNIQUE:   Stop and chop   DESCRIPTION OF PROCEDURE:  The patient was examined and consented in the preoperative holding area where the aforementioned topical anesthesia was applied to the right eye and then brought back to the Operating Room where the right eye was prepped and draped in the usual sterile ophthalmic fashion and a lid speculum was placed. A paracentesis was created with the side port blade and the anterior chamber was filled with viscoelastic. A near clear corneal incision was performed with the steel keratome. A continuous curvilinear capsulorrhexis was performed with a cystotome followed by the capsulorrhexis forceps. Hydrodissection and hydrodelineation were carried out with BSS on a blunt cannula. The lens was removed in a stop and chop  technique and the remaining cortical material was removed with the irrigation-aspiration handpiece. The capsular bag was inflated with viscoelastic and the Technis [id number]  lens was placed in the capsular bag without complication. The remaining viscoelastic was removed from the eye with the irrigation-aspiration handpiece. The wounds were hydrated. The anterior chamber was flushed with BSS and the eye was inflated to physiologic pressure. 0.45ml of Vigamox was placed in the anterior chamber. The wounds were found to be water tight. The eye was dressed with Combigan. The patient was given protective glasses to wear throughout the day and a shield with which to sleep tonight. The patient was also given drops with which to begin a drop regimen today and  will follow-up with me in one day. Implant Name Type Inv. Item Serial No. Manufacturer Lot No. LRB No. Used Action  LENS IOL ACRYSOF VIVITY 15.0 - [id number]  LENS IOL ACRYSOF VIVITY 15.0 [id number] [employer]  Right 1 Implanted   Procedure(s): CATARACT EXTRACTION PHACO AND INTRAOCULAR LENS PLACEMENT (IOC) RIGHT VIVITY LENS 7.38 00:56.1 (Right)  Electronically signed: Birder Robson 11/04/2021 12:30 PM

## 2021-11-04 NOTE — H&P (Signed)
[facility]   Primary Care Physician:  Ria Bush, MD Ophthalmologist: Dr. George Ina  Pre-Procedure History & Physical: HPI:  Luis Sweeney is a 81 y.o. male here for cataract surgery.   Past Medical History:  Diagnosis Date   Acute duodenal ulcer with hemorrhage but without obstruction    required  bld. transfusion   Anemia, iron deficiency    Arthritis    OA- pt. reports its. minor   CAD (coronary artery disease) 2004   a. s/p MI treated with drug-eluting stent placement to the proximal left anterior descending in late 2004 // Echocardiogram 7/22: EF 45-50, mild LVH, Gr 1 DD, inf and apical HK, normal RVSF, trivial MR, dilated ascending aorta (38 mm)   CKD (chronic kidney disease), stage III ([place])    COVID-19 virus infection 10/2020   Diverticulosis of colon    Duodenal stricture - recurrent 09/21/2014   Erb's palsy    birth trauma, pt. reports he was 11lbs. + at birth    GERD (gastroesophageal reflux disease)    H/O hiatal hernia    Helicobacter pylori gastritis    recurrent   Hypercholesteremia    Hyperglycemia    Hypertension    Myocardial infarct ([place])    NAFLD (nonalcoholic fatty liver disease) 2015   with liver cysts   Paralysis of upper limb (HCC)    R arm- birth injury   Personal history of colonic polyps    Small bowel obstruction ([place])    Urticaria    Lat left arm    Past Surgical History:  Procedure Laterality Date   ANTERIOR CERVICAL DECOMP/DISCECTOMY FUSION  03/2013   C3/4, C4/5 HNP (kritzer)   ANTERIOR CERVICAL DECOMP/DISCECTOMY FUSION N/A 03/15/2013   Procedure: ANTERIOR CERVICAL DECOMPRESSION/DISCECTOMY FUSION 2 LEVELS;  Surgeon: Faythe Ghee, MD;  [facility]: [facility];  Service: Neurosurgery;  Laterality: N/A;  Cervical three-four, cervical four-five anterior cervical diskectomy fusion with a trabecular metal plus plate    CARDIAC CATHETERIZATION  2006   stent placed   CARDIAC CATHETERIZATION N/A 07/08/2016   Procedure: Left Heart  Cath and Coronary Angiography;  Surgeon: Sherren Mocha, MD;  Location: [facility];  Service: Cardiovascular;  Laterality: N/A;   COLONOSCOPY  multiple   diverticulosis, int hem, h/o adenomatous polyps [name])   COLONOSCOPY  07/2014   severe diverticulosis, no rpt needed [name])   CORONARY STENT PLACEMENT  2006   drug-eluting   ESOPHAGOGASTRODUODENOSCOPY  11/26/10   mild gastritis, duodenitis, duodenal stricture (Dr. [name])   ESOPHAGOGASTRODUODENOSCOPY  10//2015   duodenal stricture, ?recurrent ulcer [name])   HERNIA REPAIR Right 2006   Dr. Hassell Done   KNEE SURGERY     left   SHOULDER SURGERY     right   spinal cyst removal     WRIST SURGERY     XI ROBOTIC ASSISTED INGUINAL HERNIA REPAIR WITH MESH Right 05/29/2021   Procedure: XI ROBOTIC ASSISTED INGUINAL HERNIA REPAIR WITH MESH;  Surgeon: Herbert Pun, MD;  Location: [facility];  Service: General;  Laterality: Right;    Prior to Admission medications   Medication Sig Start Date End Date Taking? Authorizing Provider  aspirin EC 81 MG tablet Take 81 mg by mouth daily.   Yes [provider]  Cholecalciferol (VITAMIN D) 125 MCG (5000 UT) CAPS Take 1 capsule by mouth once a week. 02/07/20  Yes Ria Bush, MD  lovastatin (MEVACOR) 40 MG tablet TAKE 1 TABLET BY MOUTH EVERY MONDAY, [place], AND FRIDAY 03/17/21  Yes  Ria Bush, MD  pantoprazole (PROTONIX) 40 MG tablet Take 1 tablet (40 mg total) by mouth daily as needed (GERD symptoms). 10/27/21  Yes Ria Bush, MD  ramipril (ALTACE) 5 MG capsule TAKE 1 CAPSULE BY MOUTH TWICE DAILY 07/25/21  Yes Sherren Mocha, MD  vitamin B-12 (CYANOCOBALAMIN) 1000 MCG tablet Take 1,000 mcg by mouth daily.   Yes [provider]    Allergies as of 10/15/2021 - Review Complete 05/29/2021  Allergen Reaction Noted   Prednisone Other (See Comments) 03/03/2017   Codeine Hives and Itching 03/03/2007   Lipitor [atorvastatin] Other (See Comments)  07/27/2014   Morphine Hives and Itching 03/03/2007   Simvastatin Other (See Comments) 03/08/2007    Family History  Problem Relation Age of Onset   Diabetes Father    Heart disease Father        CHF   Kidney disease Father        kidney failure   Heart failure Father    Diabetes Sister    Hypertension Sister    Cancer Sister        pelvic mass   Hypertension Sister    Arthritis Sister    Ovarian cancer Other     Social History   Socioeconomic History   Marital status: Married    Spouse name: Not on file   Number of children: 3   Years of education: Not on file   Highest education level: Not on file  Occupational History   Occupation: HT Warden/ranger: [employer]    Comment: metal Fabrication/Stairs/Rails commerial  Tobacco Use   Smoking status: Former    Packs/day: 1.50    Years: 3.00    Pack years: 4.50    Types: Cigarettes    Quit date: 02/16/1985    Years since quitting: 36.7   Smokeless tobacco: Never  Vaping Use   Vaping Use: Never used  Substance and Sexual Activity   Alcohol use: Yes    Alcohol/week: 0.0 standard drinks    Comment: two to three times a year   Drug use: No   Sexual activity: Never    Comment: last few years.  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   Social Determinants of Radio broadcast assistant Strain: Low Risk    Difficulty of Paying Living Expenses: Not hard at all  Food Insecurity: No Food Insecurity   Worried About Charity fundraiser in the Last Year: Never true   Arboriculturist in the Last Year: Never true  Transportation Needs: No Transportation Needs   Lack of Transportation (Medical): No   Lack of Transportation  (Non-Medical): No  Physical Activity: Inactive   Days of Exercise per Week: 0 days   Minutes of Exercise per Session: 0 min  Stress: No Stress Concern Present   Feeling of Stress : Not at all  Social Connections: Not on file  Intimate Partner Violence: Not At Risk   Fear of Current or Ex-Partner: No   Emotionally Abused: No   Physically Abused: No   Sexually Abused: No    Review of Systems: See HPI, otherwise negative  ROS  Physical Exam: BP (!) 150/71    Pulse 71    Temp 98.1 F (36.7 C) (Temporal)    Resp 20    Ht 6\' 2"  (1.88 m)    Wt 109.3 kg    SpO2 99%    BMI 30.94 kg/m  General:   Alert, cooperative in NAD Head:  Normocephalic and atraumatic. Respiratory:  Normal work of breathing. Cardiovascular:  RRR  Impression/Plan: SHAHEED SCHMUCK is here for cataract surgery.  Risks, benefits, limitations, and alternatives regarding cataract surgery have been reviewed with the patient.  Questions have been answered.  All parties agreeable.   Birder Robson, MD  11/04/2021, 12:02 PM

## 2021-11-04 NOTE — Anesthesia Procedure Notes (Signed)
Procedure Name: MAC Date/Time: 11/04/2021 12:16 PM Performed by: Cameron Ali, CRNA Pre-anesthesia Checklist: Patient identified, Emergency Drugs available, Suction available, Timeout performed and Patient being monitored Patient Re-evaluated:Patient Re-evaluated prior to induction Oxygen Delivery Method: Nasal cannula Placement Confirmation: positive ETCO2

## 2021-11-04 NOTE — Anesthesia Postprocedure Evaluation (Signed)
Anesthesia Post Note  Patient: Luis Sweeney  Procedure(s) Performed: CATARACT EXTRACTION PHACO AND INTRAOCULAR LENS PLACEMENT (IOC) RIGHT VIVITY LENS 7.38 00:56.1 (Right: Eye)     Patient location during evaluation: PACU Anesthesia Type: MAC Level of consciousness: awake Pain management: pain level controlled Vital Signs Assessment: post-procedure vital signs reviewed and stable Respiratory status: respiratory function stable Cardiovascular status: stable Postop Assessment: no apparent nausea or vomiting Anesthetic complications: no   No notable events documented.  Veda Canning

## 2021-11-05 ENCOUNTER — Other Ambulatory Visit: Payer: Self-pay

## 2021-11-05 ENCOUNTER — Encounter: Payer: Self-pay | Admitting: Ophthalmology

## 2021-11-10 DIAGNOSIS — H2512 Age-related nuclear cataract, left eye: Secondary | ICD-10-CM | POA: Diagnosis not present

## 2021-11-11 DIAGNOSIS — I509 Heart failure, unspecified: Secondary | ICD-10-CM

## 2021-11-11 DIAGNOSIS — E118 Type 2 diabetes mellitus with unspecified complications: Secondary | ICD-10-CM

## 2021-11-11 DIAGNOSIS — E1169 Type 2 diabetes mellitus with other specified complication: Secondary | ICD-10-CM

## 2021-11-11 DIAGNOSIS — E785 Hyperlipidemia, unspecified: Secondary | ICD-10-CM

## 2021-11-11 DIAGNOSIS — I2510 Atherosclerotic heart disease of native coronary artery without angina pectoris: Secondary | ICD-10-CM

## 2021-11-11 DIAGNOSIS — I10 Essential (primary) hypertension: Secondary | ICD-10-CM | POA: Diagnosis not present

## 2021-11-13 NOTE — Discharge Instructions (Signed)

## 2021-11-14 ENCOUNTER — Ambulatory Visit
Admission: EM | Admit: 2021-11-14 | Discharge: 2021-11-14 | Disposition: A | Discharge status: Home / Self Care | Payer: PPO | Attending: Emergency Medicine | Admitting: Emergency Medicine

## 2021-11-14 ENCOUNTER — Other Ambulatory Visit: Payer: Self-pay

## 2021-11-14 DIAGNOSIS — T148XXA Other injury of unspecified body region, initial encounter: Secondary | ICD-10-CM | POA: Diagnosis not present

## 2021-11-14 DIAGNOSIS — G8929 Other chronic pain: Secondary | ICD-10-CM

## 2021-11-14 DIAGNOSIS — M549 Dorsalgia, unspecified: Secondary | ICD-10-CM

## 2021-11-14 LAB — POCT URINALYSIS DIP (MANUAL ENTRY)
Bilirubin, UA: NEGATIVE
Blood, UA: NEGATIVE
Glucose, UA: NEGATIVE mg/dL
Ketones, POC UA: NEGATIVE mg/dL
Leukocytes, UA: NEGATIVE
Nitrite, UA: NEGATIVE
Protein Ur, POC: NEGATIVE mg/dL
Spec Grav, UA: 1.025 (ref 1.010–1.025)
Urobilinogen, UA: 0.2 E.U./dL
pH, UA: 5.5 (ref 5.0–8.0)

## 2021-11-14 MED ORDER — MELOXICAM 7.5 MG PO TABS: 7.5000 mg | ORAL_TABLET | Freq: Every day | ORAL | 0 refills | Status: DC

## 2021-11-14 MED ORDER — METHOCARBAMOL 500 MG PO TABS: 500.0000 mg | ORAL_TABLET | Freq: Once | ORAL | 0 refills | Status: AC

## 2021-11-14 NOTE — ED Triage Notes (Signed)
Patient presents to Urgent Care with complaints of upper right side pain x 1 week. Hx of kidney stones. Denies fever or urinary symptoms.

## 2021-11-14 NOTE — ED Provider Notes (Signed)
Luis Sweeney    CSN: [id number] Arrival date & time: 11/14/21  1121      History   Chief Complaint Chief Complaint  Patient presents with   Back Pain   Flank Pain    Upper right     HPI Luis Sweeney is a 81 y.o. male.   HPI  Past Medical History:  Diagnosis Date   Acute duodenal ulcer with hemorrhage but without obstruction    required  bld. transfusion   Anemia, iron deficiency    Arthritis    OA- pt. reports its. minor   CAD (coronary artery disease) 2004   a. s/p MI treated with drug-eluting stent placement to the proximal left anterior descending in late 2004 // Echocardiogram 7/22: EF 45-50, mild LVH, Gr 1 DD, inf and apical HK, normal RVSF, trivial MR, dilated ascending aorta (38 mm)   CKD (chronic kidney disease), stage III ([place])    COVID-19 virus infection 10/2020   Diverticulosis of colon    Duodenal stricture - recurrent 09/21/2014   Erb's palsy    birth trauma, pt. reports he was 11lbs. + at birth    GERD (gastroesophageal reflux disease)    H/O hiatal hernia    Helicobacter pylori gastritis    recurrent   Hypercholesteremia    Hyperglycemia    Hypertension    Myocardial infarct ([place])    NAFLD (nonalcoholic fatty liver disease) 2015   with liver cysts   Paralysis of upper limb (HCC)    R arm- birth injury   Personal history of colonic polyps    Small bowel obstruction ([place])    Urticaria    Lat left arm    Patient Active Problem List   Diagnosis Date Noted   Incarcerated inguinal hernia, unilateral 05/29/2021   Dilatation of thoracic aorta ([place]) 05/11/2021   CHF (congestive heart failure) ([place]) 05/11/2021   SBO (small bowel obstruction) ([place]) 11/21/2020   Acute renal failure superimposed on stage 3 chronic kidney disease ([place]) 11/21/2020   Hyperkalemia 11/21/2020   Leukocytosis 11/21/2020   Primary osteoarthritis of right hip 06/19/2020   Acute left-sided low back pain with left-sided sciatica 05/17/2019   Essential hypertension  09/27/2018   Health maintenance examination 02/25/2016   Controlled diabetes mellitus type 2 with complications ([place]) 92/08/9416   Irregular heart beat 02/25/2016   Advanced care planning/counseling discussion 02/20/2015   Obesity, Class I, BMI 30-34.9 02/20/2015   Duodenal stricture - recurrent 09/21/2014   Hx of peptic ulcer 06/11/2014   NAFLD (nonalcoholic fatty liver disease)    Medicare annual wellness visit, subsequent 05/31/2012   Vitamin B12 deficiency 05/31/2012   Recurrent right inguinal hernia 05/31/2012   Dysuria 11/26/2010   Coronary atherosclerosis of native coronary artery 03/13/2009   CKD stage 3 due to type 2 diabetes mellitus ([place]) 03/13/2009   Hyperlipidemia associated with type 2 diabetes mellitus ([place]) 01/04/2008   History of colonic polyps 11/07/2007   GERD 03/08/2007   MYOCARDIAL INFARCTION, HX OF 03/07/2007   ERB'S PALSY 03/07/2007    Past Surgical History:  Procedure Laterality Date   ANTERIOR CERVICAL DECOMP/DISCECTOMY FUSION  03/2013   C3/4, C4/5 HNP (kritzer)   ANTERIOR CERVICAL DECOMP/DISCECTOMY FUSION N/A 03/15/2013   Procedure: ANTERIOR CERVICAL DECOMPRESSION/DISCECTOMY FUSION 2 LEVELS;  Surgeon: Faythe Ghee, MD;  Location: [facility];  Service: Neurosurgery;  Laterality: N/A;  Cervical three-four, cervical four-five anterior cervical diskectomy fusion with a trabecular metal plus plate    CARDIAC CATHETERIZATION  2006  stent placed   CARDIAC CATHETERIZATION N/A 07/08/2016   Procedure: Left Heart Cath and Coronary Angiography;  Surgeon: Sherren Mocha, MD;  Location: [facility];  Service: Cardiovascular;  Laterality: N/A;   CATARACT EXTRACTION W/PHACO Right 11/04/2021   Procedure: CATARACT EXTRACTION PHACO AND INTRAOCULAR LENS PLACEMENT ([place]) RIGHT VIVITY LENS 7.38 00:56.1;  Surgeon: Birder Robson, MD;  Location: [facility];  Service: Ophthalmology;  Laterality: Right;   COLONOSCOPY  multiple   diverticulosis, int hem, h/o  adenomatous polyps [name])   COLONOSCOPY  07/2014   severe diverticulosis, no rpt needed [name])   CORONARY STENT PLACEMENT  2006   drug-eluting   ESOPHAGOGASTRODUODENOSCOPY  11/26/10   mild gastritis, duodenitis, duodenal stricture (Dr. [name])   ESOPHAGOGASTRODUODENOSCOPY  10//2015   duodenal stricture, ?recurrent ulcer [name])   HERNIA REPAIR Right 2006   Dr. Hassell Done   KNEE SURGERY     left   SHOULDER SURGERY     right   spinal cyst removal     WRIST SURGERY     XI ROBOTIC ASSISTED INGUINAL HERNIA REPAIR WITH MESH Right 05/29/2021   Procedure: XI ROBOTIC ASSISTED INGUINAL HERNIA REPAIR WITH MESH;  Surgeon: Herbert Pun, MD;  Location: [facility];  Service: General;  Laterality: Right;       Home Medications    Prior to Admission medications   Medication Sig Start Date End Date Taking? Authorizing Provider  meloxicam (MOBIC) 7.5 MG tablet Take 1 tablet (7.5 mg total) by mouth daily. 11/14/21  Yes Marney Setting, NP  methocarbamol (ROBAXIN) 500 MG tablet Take 1 tablet (500 mg total) by mouth once for 1 dose. 11/14/21 11/14/21 Yes Marney Setting, NP  aspirin EC 81 MG tablet Take 81 mg by mouth daily.    [provider]  Cholecalciferol (VITAMIN D) 125 MCG (5000 UT) CAPS Take 1 capsule by mouth once a week. 02/07/20   Ria Bush, MD  lovastatin (MEVACOR) 40 MG tablet TAKE 1 TABLET BY MOUTH EVERY MONDAY, [place], AND FRIDAY 03/17/21   Ria Bush, MD  pantoprazole (PROTONIX) 40 MG tablet Take 1 tablet (40 mg total) by mouth daily as needed (GERD symptoms). 10/27/21   Ria Bush, MD  ramipril (ALTACE) 5 MG capsule TAKE 1 CAPSULE BY MOUTH TWICE DAILY 07/25/21   Sherren Mocha, MD  vitamin B-12 (CYANOCOBALAMIN) 1000 MCG tablet Take 1,000 mcg by mouth daily.    [provider]    Family History Family History  Problem Relation Age of Onset   Diabetes Father    Heart disease Father        CHF   Kidney disease Father         kidney failure   Heart failure Father    Diabetes Sister    Hypertension Sister    Cancer Sister        pelvic mass   Hypertension Sister    Arthritis Sister    Ovarian cancer Other     Social History Social History   Tobacco Use   Smoking status: Former    Packs/day: 1.50    Years: 3.00    Pack years: 4.50    Types: Cigarettes    Quit date: 02/16/1985    Years since quitting: 36.7   Smokeless tobacco: Never  Vaping Use   Vaping Use: Never used  Substance Use Topics   Alcohol use: Yes    Alcohol/week: 0.0 standard drinks    Comment: two to three times a year   Drug use:  No     Allergies   Prednisone, Codeine, Lipitor [atorvastatin], Morphine, and Simvastatin   Review of Systems Review of Systems   Physical Exam Triage Vital Signs ED Triage Vitals  Enc Vitals Group     BP 11/14/21 1203 129/64     Pulse Rate 11/14/21 1203 68     Resp 11/14/21 1203 16     Temp 11/14/21 1203 97.7 F (36.5 C)     Temp Source 11/14/21 1203 Oral     SpO2 11/14/21 1203 95 %     Weight --      Height --      Head Circumference --      Peak Flow --      Pain Score 11/14/21 1202 4     Pain Loc --      Pain Edu? --      Excl. in [place]? --    No data found.  Updated Vital Signs BP 129/64 (BP Location: Left Arm)    Pulse 68    Temp 97.7 F (36.5 C) (Oral)    Resp 16    SpO2 95%   Visual Acuity Right Eye Distance:   Left Eye Distance:   Bilateral Distance:    Right Eye Near:   Left Eye Near:    Bilateral Near:     Physical Exam   UC Treatments / Results  Labs (all labs ordered are listed, but only abnormal results are displayed) Labs Reviewed  POCT URINALYSIS DIP (MANUAL ENTRY) - Abnormal; Notable for the following components:      Result Value   Color, UA light yellow (*)    All other components within normal limits    EKG   Radiology No results found.  Procedures Procedures (including critical care time)  Medications Ordered in UC Medications - No data  to display  Initial Impression / Assessment and Plan / UC Course  I have reviewed the triage vital signs and the nursing notes.  Pertinent labs & imaging results that were available during my care of the patient were reviewed by me and considered in my medical decision making (see chart for details).     Take half of the dose of the muscle relaxer to prevent drowsiness.  Do not drive while taking this medication.  Only take as needed. Stop taking the NSAID 3 days before your procedure to prevent thinning of blood Contact your orthopedic for further testing and x-rays Urine is negative does not show any blood.  Your location for discomfort is higher in the back area versus kidneys.  If symptoms continue you need to be seen in the emergency room for further testing.  Final Clinical Impressions(s) / UC Diagnoses   Final diagnoses:  Other chronic back pain  Muscle strain     Discharge Instructions      Take half of the dose of the muscle relaxer to prevent drowsiness.  Do not drive while taking this medication.  Only take as needed. Stop taking the NSAID 3 days before your procedure to prevent thinning of blood Contact your orthopedic for further testing and x-rays Urine is negative does not show any blood.  Your location for discomfort is higher in the back area versus kidneys.  If symptoms continue you need to be seen in the emergency room for further testing.     ED Prescriptions     Medication Sig Dispense Auth. Provider   meloxicam (MOBIC) 7.5 MG tablet Take 1 tablet (7.5 mg total)  by mouth daily. 20 tablet Morley Kos L, NP   methocarbamol (ROBAXIN) 500 MG tablet Take 1 tablet (500 mg total) by mouth once for 1 dose. 20 tablet Marney Setting, NP      PDMP not reviewed this encounter.   Marney Setting, NP 11/14/21 1300

## 2021-11-14 NOTE — Discharge Instructions (Addendum)
Take half of the dose of the muscle relaxer to prevent drowsiness.  Do not drive while taking this medication.  Only take as needed. Stop taking the NSAID 3 days before your procedure to prevent thinning of blood Contact your orthopedic for further testing and x-rays Urine is negative does not show any blood.  Your location for discomfort is higher in the back area versus kidneys.  If symptoms continue you need to be seen in the emergency room for further testing.

## 2021-11-18 ENCOUNTER — Encounter: Payer: Self-pay | Admitting: Ophthalmology

## 2021-11-18 ENCOUNTER — Other Ambulatory Visit: Payer: Self-pay

## 2021-11-18 ENCOUNTER — Ambulatory Visit: Payer: PPO | Admitting: Anesthesiology

## 2021-11-18 ENCOUNTER — Ambulatory Visit
Admission: RE | Admit: 2021-11-18 | Discharge: 2021-11-18 | Disposition: A | Discharge status: Home / Self Care | Payer: PPO | Source: Ambulatory Visit | Attending: Ophthalmology | Admitting: Ophthalmology

## 2021-11-18 ENCOUNTER — Encounter
Admission: RE | Disposition: A | Discharge status: Home / Self Care | Payer: Self-pay | Source: Ambulatory Visit | Attending: Ophthalmology

## 2021-11-18 DIAGNOSIS — I130 Hypertensive heart and chronic kidney disease with heart failure and stage 1 through stage 4 chronic kidney disease, or unspecified chronic kidney disease: Secondary | ICD-10-CM | POA: Insufficient documentation

## 2021-11-18 DIAGNOSIS — Z87891 Personal history of nicotine dependence: Secondary | ICD-10-CM | POA: Insufficient documentation

## 2021-11-18 DIAGNOSIS — E1122 Type 2 diabetes mellitus with diabetic chronic kidney disease: Secondary | ICD-10-CM | POA: Diagnosis not present

## 2021-11-18 DIAGNOSIS — H25812 Combined forms of age-related cataract, left eye: Secondary | ICD-10-CM | POA: Diagnosis not present

## 2021-11-18 DIAGNOSIS — N189 Chronic kidney disease, unspecified: Secondary | ICD-10-CM | POA: Insufficient documentation

## 2021-11-18 DIAGNOSIS — K219 Gastro-esophageal reflux disease without esophagitis: Secondary | ICD-10-CM | POA: Diagnosis not present

## 2021-11-18 DIAGNOSIS — H2512 Age-related nuclear cataract, left eye: Secondary | ICD-10-CM | POA: Diagnosis not present

## 2021-11-18 DIAGNOSIS — I252 Old myocardial infarction: Secondary | ICD-10-CM | POA: Diagnosis not present

## 2021-11-18 DIAGNOSIS — I2510 Atherosclerotic heart disease of native coronary artery without angina pectoris: Secondary | ICD-10-CM | POA: Insufficient documentation

## 2021-11-18 DIAGNOSIS — I509 Heart failure, unspecified: Secondary | ICD-10-CM | POA: Diagnosis not present

## 2021-11-18 HISTORY — PX: CATARACT EXTRACTION W/PHACO: SHX586

## 2021-11-18 SURGERY — PHACOEMULSIFICATION, CATARACT, WITH IOL INSERTION
Anesthesia: Monitor Anesthesia Care | Site: Eye | Laterality: Left

## 2021-11-18 MED ORDER — SIGHTPATH DOSE#1 NA CHONDROIT SULF-NA HYALURON 40-17 MG/ML IO SOLN
INTRAOCULAR | Status: DC | PRN
  Administered 2021-11-18: 1 mL via INTRAOCULAR

## 2021-11-18 MED ORDER — MIDAZOLAM HCL 2 MG/2ML IJ SOLN
INTRAMUSCULAR | Status: DC | PRN
  Administered 2021-11-18: 2 mg via INTRAVENOUS

## 2021-11-18 MED ORDER — BRIMONIDINE TARTRATE-TIMOLOL 0.2-0.5 % OP SOLN
OPHTHALMIC | Status: DC | PRN
  Administered 2021-11-18: 1 [drp] via OPHTHALMIC

## 2021-11-18 MED ORDER — FENTANYL CITRATE (PF) 100 MCG/2ML IJ SOLN
INTRAMUSCULAR | Status: DC | PRN
  Administered 2021-11-18: 50 ug via INTRAVENOUS

## 2021-11-18 MED ORDER — SIGHTPATH DOSE#1 BSS IO SOLN
INTRAOCULAR | Status: DC | PRN
  Administered 2021-11-18: 15 mL via INTRAOCULAR

## 2021-11-18 MED ORDER — SIGHTPATH DOSE#1 BSS IO SOLN
INTRAOCULAR | Status: DC | PRN
  Administered 2021-11-18: 2 mL

## 2021-11-18 MED ORDER — ACETAMINOPHEN 160 MG/5ML PO SOLN: 325.0000 mg | ORAL | Status: DC | PRN

## 2021-11-18 MED ORDER — SIGHTPATH DOSE#1 BSS IO SOLN
INTRAOCULAR | Status: DC | PRN
  Administered 2021-11-18: 58 mL via OPHTHALMIC

## 2021-11-18 MED ORDER — LACTATED RINGERS IV SOLN: INTRAVENOUS | Status: DC

## 2021-11-18 MED ORDER — MOXIFLOXACIN HCL 0.5 % OP SOLN
OPHTHALMIC | Status: DC | PRN
Start: 2021-11-18 — End: 2021-11-18
  Administered 2021-11-18: 0.2 mL via OPHTHALMIC

## 2021-11-18 MED ORDER — ACETAMINOPHEN 325 MG PO TABS: 325.0000 mg | ORAL_TABLET | ORAL | Status: DC | PRN

## 2021-11-18 MED ORDER — TETRACAINE HCL 0.5 % OP SOLN
1.0000 [drp] | OPHTHALMIC | Status: AC | PRN
  Administered 2021-11-18 (×3): 1 [drp] via OPHTHALMIC

## 2021-11-18 MED ORDER — ARMC OPHTHALMIC DILATING DROPS
1.0000 "application " | OPHTHALMIC | Status: AC | PRN
  Administered 2021-11-18 (×3): 1 via OPHTHALMIC

## 2021-11-18 SURGICAL SUPPLY — 13 items
CANNULA ANT/CHMB 27G (MISCELLANEOUS) IMPLANT
CANNULA ANT/CHMB 27GA (MISCELLANEOUS) ×2 IMPLANT
CATARACT SUITE SIGHTPATH (MISCELLANEOUS) ×2 IMPLANT
FEE CATARACT SUITE SIGHTPATH (MISCELLANEOUS) ×1 IMPLANT
GLOVE SURG ENC TEXT LTX SZ8 (GLOVE) ×2 IMPLANT
GLOVE SURG TRIUMPH 8.0 PF LTX (GLOVE) ×2 IMPLANT
LENS IOL ACRSF IQ VT 15 14.5 IMPLANT
LENS IOL ACRYSOF VIVITY +14.5 ×2 IMPLANT
NDL FILTER BLUNT 18X1 1/2 (NEEDLE) ×1 IMPLANT
NEEDLE FILTER BLUNT 18X 1/2SAF (NEEDLE) ×1
NEEDLE FILTER BLUNT 18X1 1/2 (NEEDLE) ×1 IMPLANT
SYR 3ML LL SCALE MARK (SYRINGE) ×2 IMPLANT
WATER STERILE IRR 250ML POUR (IV SOLUTION) ×2 IMPLANT

## 2021-11-18 NOTE — H&P (Signed)
[facility]   Primary Care Physician:  Ria Bush, MD Ophthalmologist: Dr. George Ina  Pre-Procedure History & Physical: HPI:  Luis Sweeney is a 81 y.o. male here for cataract surgery.   Past Medical History:  Diagnosis Date   Acute duodenal ulcer with hemorrhage but without obstruction    required  bld. transfusion   Anemia, iron deficiency    Arthritis    OA- pt. reports its. minor   CAD (coronary artery disease) 2004   a. s/p MI treated with drug-eluting stent placement to the proximal left anterior descending in late 2004 // Echocardiogram 7/22: EF 45-50, mild LVH, Gr 1 DD, inf and apical HK, normal RVSF, trivial MR, dilated ascending aorta (38 mm)   CKD (chronic kidney disease), stage III ([place])    COVID-19 virus infection 10/2020   Diverticulosis of colon    Duodenal stricture - recurrent 09/21/2014   Erb's palsy    birth trauma, pt. reports he was 11lbs. + at birth    GERD (gastroesophageal reflux disease)    H/O hiatal hernia    Helicobacter pylori gastritis    recurrent   Hypercholesteremia    Hyperglycemia    Hypertension    Myocardial infarct ([place])    NAFLD (nonalcoholic fatty liver disease) 2015   with liver cysts   Paralysis of upper limb (HCC)    R arm- birth injury   Personal history of colonic polyps    Small bowel obstruction ([place])    Urticaria    Lat left arm    Past Surgical History:  Procedure Laterality Date   ANTERIOR CERVICAL DECOMP/DISCECTOMY FUSION  03/2013   C3/4, C4/5 HNP (kritzer)   ANTERIOR CERVICAL DECOMP/DISCECTOMY FUSION N/A 03/15/2013   Procedure: ANTERIOR CERVICAL DECOMPRESSION/DISCECTOMY FUSION 2 LEVELS;  Surgeon: Faythe Ghee, MD;  Location: [facility];  Service: Neurosurgery;  Laterality: N/A;  Cervical three-four, cervical four-five anterior cervical diskectomy fusion with a trabecular metal plus plate    CARDIAC CATHETERIZATION  2006   stent placed   CARDIAC CATHETERIZATION N/A 07/08/2016   Procedure: Left Heart  Cath and Coronary Angiography;  Surgeon: Sherren Mocha, MD;  Location: [place] [facility];  Service: Cardiovascular;  Laterality: N/A;   CATARACT EXTRACTION W/PHACO Right 11/04/2021   Procedure: CATARACT EXTRACTION PHACO AND INTRAOCULAR LENS PLACEMENT ([place]) RIGHT VIVITY LENS 7.38 00:56.1;  Surgeon: Birder Robson, MD;  Location: [place];  Service: Ophthalmology;  Laterality: Right;   COLONOSCOPY  multiple   diverticulosis, int hem, h/o adenomatous polyps [name])   COLONOSCOPY  07/2014   severe diverticulosis, no rpt needed [name])   CORONARY STENT PLACEMENT  2006   drug-eluting   ESOPHAGOGASTRODUODENOSCOPY  11/26/10   mild gastritis, duodenitis, duodenal stricture (Dr. [name])   ESOPHAGOGASTRODUODENOSCOPY  10//2015   duodenal stricture, ?recurrent ulcer [name])   HERNIA REPAIR Right 2006   Dr. Hassell Done   KNEE SURGERY     left   SHOULDER SURGERY     right   spinal cyst removal     WRIST SURGERY     XI ROBOTIC ASSISTED INGUINAL HERNIA REPAIR WITH MESH Right 05/29/2021   Procedure: XI ROBOTIC ASSISTED INGUINAL HERNIA REPAIR WITH MESH;  Surgeon: Herbert Pun, MD;  Location: [facility];  Service: General;  Laterality: Right;    Prior to Admission medications   Medication Sig Start Date End Date Taking? Authorizing Provider  aspirin EC 81 MG tablet Take 81 mg by mouth daily.   Yes [provider]  Cholecalciferol (VITAMIN  D) 125 MCG (5000 UT) CAPS Take 1 capsule by mouth once a week. 02/07/20  Yes Ria Bush, MD  lovastatin (MEVACOR) 40 MG tablet TAKE 1 TABLET BY MOUTH EVERY MONDAY, WEDNESDAY, AND FRIDAY 03/17/21  Yes Ria Bush, MD  meloxicam (MOBIC) 7.5 MG tablet Take 1 tablet (7.5 mg total) by mouth daily. 11/14/21  Yes Marney Setting, NP  pantoprazole (PROTONIX) 40 MG tablet Take 1 tablet (40 mg total) by mouth daily as needed (GERD symptoms). 10/27/21  Yes Ria Bush, MD  ramipril (ALTACE) 5 MG capsule TAKE 1 CAPSULE BY MOUTH TWICE  DAILY 07/25/21  Yes Sherren Mocha, MD  vitamin B-12 (CYANOCOBALAMIN) 1000 MCG tablet Take 1,000 mcg by mouth daily.   Yes [provider]    Allergies as of 10/15/2021 - Review Complete 05/29/2021  Allergen Reaction Noted   Prednisone Other (See Comments) 03/03/2017   Codeine Hives and Itching 03/03/2007   Lipitor [atorvastatin] Other (See Comments) 07/27/2014   Morphine Hives and Itching 03/03/2007   Simvastatin Other (See Comments) 03/08/2007    Family History  Problem Relation Age of Onset   Diabetes Father    Heart disease Father        CHF   Kidney disease Father        kidney failure   Heart failure Father    Diabetes Sister    Hypertension Sister    Cancer Sister        pelvic mass   Hypertension Sister    Arthritis Sister    Ovarian cancer Other     Social History   Socioeconomic History   Marital status: Married    Spouse name: Not on file   Number of children: 3   Years of education: Not on file   Highest education level: Not on file  Occupational History   Occupation: HT Warden/ranger: [employer]    Comment: metal Fabrication/Stairs/Rails commerial  Tobacco Use   Smoking status: Former    Packs/day: 1.50    Years: 3.00    Pack years: 4.50    Types: Cigarettes    Quit date: 02/16/1985    Years since quitting: 36.7   Smokeless tobacco: Never  Vaping Use   Vaping Use: Never used  Substance and Sexual Activity   Alcohol use: Yes    Alcohol/week: 0.0 standard drinks    Comment: two to three times a year   Drug use: No   Sexual activity: Never    Comment: last few years.  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   Social Determinants of  Radio broadcast assistant Strain: Low Risk    Difficulty of Paying Living Expenses: Not hard at all  Food Insecurity: No Food Insecurity   Worried About Charity fundraiser in the Last Year: Never true   Arboriculturist in the Last Year: Never true  Transportation Needs: No Transportation Needs   Lack of Transportation (Medical): No   Lack of Transportation (Non-Medical): No  Physical Activity: Inactive   Days of Exercise per Week: 0 days   Minutes of Exercise per Session: 0 min  Stress: No Stress Concern Present   Feeling of Stress : Not at all  Social Connections: Not on file  Intimate Partner Violence: Not At Risk   Fear of Current or Ex-Partner: No   Emotionally Abused: No   Physically Abused: No   Sexually Abused: No    Review of Systems: See HPI, otherwise negative ROS  Physical Exam: BP (!) 148/66    Pulse 78    Temp 97.7 F (36.5 C) (Temporal)    Resp 18    Wt 109 kg    SpO2 97%    BMI 30.85 kg/m  General:   Alert, cooperative in NAD Head:  Normocephalic and atraumatic. Respiratory:  Normal work of breathing. Cardiovascular:  RRR  Impression/Plan: Luis Sweeney is here for cataract surgery.  Risks, benefits, limitations, and alternatives regarding cataract surgery have been reviewed with the patient.  Questions have been answered.  All parties agreeable.   Birder Robson, MD  11/18/2021, 9:11 AM

## 2021-11-18 NOTE — Op Note (Signed)
PREOPERATIVE DIAGNOSIS:  Nuclear sclerotic cataract of the left eye.   POSTOPERATIVE DIAGNOSIS:  Nuclear sclerotic cataract of the left eye.   OPERATIVE PROCEDURE:ORPROCALL@   SURGEON:  Birder Robson, MD.   ANESTHESIA:  Anesthesiologist: Rochel Brome, MD CRNA: Jeannene Patella, CRNA  1.      Managed anesthesia care. 2.     0.82ml of Shugarcaine was instilled following the paracentesis   COMPLICATIONS:  None.   TECHNIQUE:   Stop and chop   DESCRIPTION OF PROCEDURE:  The patient was examined and consented in the preoperative holding area where the aforementioned topical anesthesia was applied to the left eye and then brought back to the Operating Room where the left eye was prepped and draped in the usual sterile ophthalmic fashion and a lid speculum was placed. A paracentesis was created with the side port blade and the anterior chamber was filled with viscoelastic. A near clear corneal incision was performed with the steel keratome. A continuous curvilinear capsulorrhexis was performed with a cystotome followed by the capsulorrhexis forceps. Hydrodissection and hydrodelineation were carried out with BSS on a blunt cannula. The lens was removed in a stop and chop  technique and the remaining cortical material was removed with the irrigation-aspiration handpiece. The capsular bag was inflated with viscoelastic and the Technis [id number] lens was placed in the capsular bag without complication. The remaining viscoelastic was removed from the eye with the irrigation-aspiration handpiece. The wounds were hydrated. The anterior chamber was flushed with BSS and the eye was inflated to physiologic pressure. 0.69ml Vigamox was placed in the anterior chamber. The wounds were found to be water tight. The eye was dressed with Combigan. The patient was given protective glasses to wear throughout the day and a shield with which to sleep tonight. The patient was also given drops with which to begin a drop  regimen today and will follow-up with me in one day. Implant Name Type Inv. Item Serial No. Manufacturer Lot No. LRB No. Used Action  AcruSof IQ Vivity 14.5 Intraocular Lens  [id number] ALCON  Left 1 Implanted    Procedure(s) with comments: CATARACT EXTRACTION PHACO AND INTRAOCULAR LENS PLACEMENT ([facility]) LEFT VIVITY LENS 7.96 00:47.1 (Left) - wants late arrival  Electronically signed: Birder Robson 11/18/2021 9:34 AM

## 2021-11-18 NOTE — Anesthesia Preprocedure Evaluation (Signed)
Anesthesia Evaluation  Patient identified by MRN, date of birth, ID band Patient awake    Reviewed: Allergy & Precautions, H&P , NPO status , Patient's Chart, lab work & pertinent test results, reviewed documented beta blocker date and time   Airway Mallampati: II  TM Distance: >3 FB Neck ROM: full    Dental no notable dental hx.    Pulmonary neg pulmonary ROS, former smoker,    Pulmonary exam normal breath sounds clear to auscultation       Cardiovascular Exercise Tolerance: Good hypertension, + CAD, + Past MI and +CHF  Normal cardiovascular exam Rhythm:regular Rate:Normal     Neuro/Psych negative neurological ROS  negative psych ROS   GI/Hepatic Neg liver ROS, hiatal hernia, Bowel prep,GERD  ,  Endo/Other  diabetes  Renal/GU CRFRenal disease  negative genitourinary   Musculoskeletal   Abdominal   Peds  Hematology negative hematology ROS (+)   Anesthesia Other Findings   Reproductive/Obstetrics negative OB ROS                             Anesthesia Physical Anesthesia Plan  ASA: 3  Anesthesia Plan: MAC   Post-op Pain Management:    Induction:   PONV Risk Score and Plan:   Airway Management Planned:   Additional Equipment:   Intra-op Plan:   Post-operative Plan:   Informed Consent: I have reviewed the patients History and Physical, chart, labs and discussed the procedure including the risks, benefits and alternatives for the proposed anesthesia with the patient or authorized representative who has indicated his/her understanding and acceptance.     Dental Advisory Given  Plan Discussed with: CRNA and Anesthesiologist  Anesthesia Plan Comments:         Anesthesia Quick Evaluation

## 2021-11-18 NOTE — Anesthesia Postprocedure Evaluation (Signed)
Anesthesia Post Note  Patient: VANESSA ALESI  Procedure(s) Performed: CATARACT EXTRACTION PHACO AND INTRAOCULAR LENS PLACEMENT ([facility]) LEFT VIVITY LENS 7.96 00:47.1 (Left: Eye)     Patient location during evaluation: PACU Anesthesia Type: MAC Level of consciousness: awake and alert Pain management: pain level controlled Vital Signs Assessment: post-procedure vital signs reviewed and stable Respiratory status: spontaneous breathing, nonlabored ventilation, respiratory function stable and patient connected to nasal cannula oxygen Cardiovascular status: stable and blood pressure returned to baseline Postop Assessment: no apparent nausea or vomiting Anesthetic complications: no   No notable events documented.  Trecia Rogers

## 2021-11-18 NOTE — Anesthesia Procedure Notes (Signed)
Procedure Name: MAC Date/Time: 11/18/2021 9:20 AM Performed by: Jeannene Patella, CRNA Pre-anesthesia Checklist: Patient identified, Emergency Drugs available, Suction available, Timeout performed and Patient being monitored Patient Re-evaluated:Patient Re-evaluated prior to induction Oxygen Delivery Method: Nasal cannula Placement Confirmation: positive ETCO2

## 2021-11-18 NOTE — Transfer of Care (Signed)
Immediate Anesthesia Transfer of Care Note  Patient: Luis Sweeney  Procedure(s) Performed: CATARACT EXTRACTION PHACO AND INTRAOCULAR LENS PLACEMENT ([facility]) LEFT VIVITY LENS 7.96 00:47.1 (Left: Eye)  Patient Location: PACU  Anesthesia Type: MAC  Level of Consciousness: awake, alert  and patient cooperative  Airway and Oxygen Therapy: Patient Spontanous Breathing and Patient connected to supplemental oxygen  Post-op Assessment: Post-op Vital signs reviewed, Patient's Cardiovascular Status Stable, Respiratory Function Stable, Patent Airway and No signs of Nausea or vomiting  Post-op Vital Signs: Reviewed and stable  Complications: No notable events documented.

## 2021-11-19 ENCOUNTER — Encounter: Payer: Self-pay | Admitting: Ophthalmology

## 2021-11-20 ENCOUNTER — Other Ambulatory Visit: Payer: Self-pay | Admitting: Student

## 2021-11-20 ENCOUNTER — Other Ambulatory Visit (HOSPITAL_COMMUNITY): Payer: Self-pay | Admitting: Student

## 2021-11-20 DIAGNOSIS — M542 Cervicalgia: Secondary | ICD-10-CM

## 2021-11-20 DIAGNOSIS — I10 Essential (primary) hypertension: Secondary | ICD-10-CM | POA: Diagnosis not present

## 2021-11-20 DIAGNOSIS — Z6831 Body mass index (BMI) 31.0-31.9, adult: Secondary | ICD-10-CM | POA: Diagnosis not present

## 2021-12-04 ENCOUNTER — Ambulatory Visit
Admission: RE | Admit: 2021-12-04 | Discharge: 2021-12-04 | Disposition: A | Discharge status: Home / Self Care | Payer: PPO | Source: Ambulatory Visit | Attending: Student | Admitting: Student

## 2021-12-04 ENCOUNTER — Other Ambulatory Visit: Payer: Self-pay

## 2021-12-04 DIAGNOSIS — M2578 Osteophyte, vertebrae: Secondary | ICD-10-CM | POA: Diagnosis not present

## 2021-12-04 DIAGNOSIS — M542 Cervicalgia: Secondary | ICD-10-CM | POA: Insufficient documentation

## 2021-12-04 DIAGNOSIS — M47812 Spondylosis without myelopathy or radiculopathy, cervical region: Secondary | ICD-10-CM | POA: Diagnosis not present

## 2021-12-05 DIAGNOSIS — J209 Acute bronchitis, unspecified: Secondary | ICD-10-CM | POA: Diagnosis not present

## 2021-12-05 DIAGNOSIS — R059 Cough, unspecified: Secondary | ICD-10-CM | POA: Diagnosis not present

## 2021-12-05 DIAGNOSIS — J0100 Acute maxillary sinusitis, unspecified: Secondary | ICD-10-CM | POA: Diagnosis not present

## 2021-12-10 DIAGNOSIS — H26492 Other secondary cataract, left eye: Secondary | ICD-10-CM | POA: Diagnosis not present

## 2021-12-11 DIAGNOSIS — M542 Cervicalgia: Secondary | ICD-10-CM | POA: Diagnosis not present

## 2021-12-11 DIAGNOSIS — Z6831 Body mass index (BMI) 31.0-31.9, adult: Secondary | ICD-10-CM | POA: Diagnosis not present

## 2021-12-11 DIAGNOSIS — M5412 Radiculopathy, cervical region: Secondary | ICD-10-CM | POA: Diagnosis not present

## 2021-12-11 DIAGNOSIS — I10 Essential (primary) hypertension: Secondary | ICD-10-CM | POA: Diagnosis not present

## 2021-12-19 ENCOUNTER — Telehealth: Payer: Self-pay

## 2021-12-19 NOTE — Chronic Care Management (AMB) (Unsigned)
° ° °  Chronic Care Management Pharmacy Assistant   Name: DETAVIOUS RINN  MRN: [id number] DOB: [birth year]  Reason for Encounter: General Adherence   Recent office visits:  ***  Recent consult visits:  [facility] visits:  {Hospital DC Yes/No:[id number]}  Medications: Outpatient Encounter Medications as of 12/19/2021  Medication Sig Note   aspirin EC 81 MG tablet Take 81 mg by mouth daily.    Cholecalciferol (VITAMIN D) 125 MCG (5000 UT) CAPS Take 1 capsule by mouth once a week. 05/29/2021: Takes on sundays   lovastatin (MEVACOR) 40 MG tablet TAKE 1 TABLET BY MOUTH EVERY MONDAY, WEDNESDAY, AND FRIDAY    meloxicam (MOBIC) 7.5 MG tablet Take 1 tablet (7.5 mg total) by mouth daily.    pantoprazole (PROTONIX) 40 MG tablet Take 1 tablet (40 mg total) by mouth daily as needed (GERD symptoms).    ramipril (ALTACE) 5 MG capsule TAKE 1 CAPSULE BY MOUTH TWICE DAILY    vitamin B-12 (CYANOCOBALAMIN) 1000 MCG tablet Take 1,000 mcg by mouth daily.    No facility-administered encounter medications on file as of 12/19/2021.    Care Gaps:  Star Rating Drugs:  SIG***

## 2022-01-05 DIAGNOSIS — M5412 Radiculopathy, cervical region: Secondary | ICD-10-CM | POA: Diagnosis not present

## 2022-01-19 DIAGNOSIS — M545 Low back pain, unspecified: Secondary | ICD-10-CM | POA: Diagnosis not present

## 2022-03-23 ENCOUNTER — Ambulatory Visit (INDEPENDENT_AMBULATORY_CARE_PROVIDER_SITE_OTHER): Payer: PPO

## 2022-03-23 VITALS — Wt 240.0 lb

## 2022-03-23 DIAGNOSIS — Z0000 Encounter for general adult medical examination without abnormal findings: Secondary | ICD-10-CM | POA: Diagnosis not present

## 2022-03-23 NOTE — Patient Instructions (Signed)
Luis Sweeney , Thank you for taking time to come for your Medicare Wellness Visit. I appreciate your ongoing commitment to your health goals. Please review the following plan we discussed and let me know if I can assist you in the future.   Screening recommendations/referrals: Colonoscopy: aged out Recommended yearly ophthalmology/optometry visit for glaucoma screening and checkup Recommended yearly dental visit for hygiene and checkup  Vaccinations: Influenza vaccine: n/d Pneumococcal vaccine: 02/20/15 Tdap vaccine: 05/31/12 Shingles vaccine: n/c   Covid-19: 12/11/19, 01/08/20  Advanced directives: no  Conditions/risks identified: none  Next appointment: Follow up in one year for your annual wellness visit. 03/25/23 @ 11:05am by phone  Preventive Care 81 Years and Older, Male Preventive care refers to lifestyle choices and visits with your health care provider that can promote health and wellness. What does preventive care include? A yearly physical exam. This is also called an annual well check. Dental exams once or twice a year. Routine eye exams. Ask your health care provider how often you should have your eyes checked. Personal lifestyle choices, including: Daily care of your teeth and gums. Regular physical activity. Eating a healthy diet. Avoiding tobacco and drug use. Limiting alcohol use. Practicing safe sex. Taking low doses of aspirin every day. Taking vitamin and mineral supplements as recommended by your health care provider. What happens during an annual well check? The services and screenings done by your health care provider during your annual well check will depend on your age, overall health, lifestyle risk factors, and family history of disease. Counseling  Your health care provider may ask you questions about your: Alcohol use. Tobacco use. Drug use. Emotional well-being. Home and relationship well-being. Sexual activity. Eating habits. History of  falls. Memory and ability to understand (cognition). Work and work Statistician. Screening  You may have the following tests or measurements: Height, weight, and BMI. Blood pressure. Lipid and cholesterol levels. These may be checked every 5 years, or more frequently if you are over 75 years old. Skin check. Lung cancer screening. You may have this screening every year starting at age 81 if you have a 30-pack-year history of smoking and currently smoke or have quit within the past 15 years. Fecal occult blood test (FOBT) of the stool. You may have this test every year starting at age 81. Flexible sigmoidoscopy or colonoscopy. You may have a sigmoidoscopy every 5 years or a colonoscopy every 10 years starting at age 81. Prostate cancer screening. Recommendations will vary depending on your family history and other risks. Hepatitis C blood test. Hepatitis B blood test. Sexually transmitted disease (STD) testing. Diabetes screening. This is done by checking your blood sugar (glucose) after you have not eaten for a while (fasting). You may have this done every 1-3 years. Abdominal aortic aneurysm (AAA) screening. You may need this if you are a current or former smoker. Osteoporosis. You may be screened starting at age 81 if you are at high risk. Talk with your health care provider about your test results, treatment options, and if necessary, the need for more tests. Vaccines  Your health care provider may recommend certain vaccines, such as: Influenza vaccine. This is recommended every year. Tetanus, diphtheria, and acellular pertussis (Tdap, Td) vaccine. You may need a Td booster every 10 years. Zoster vaccine. You may need this after age 1. Pneumococcal 13-valent conjugate (PCV13) vaccine. One dose is recommended after age 81. Pneumococcal polysaccharide (PPSV23) vaccine. One dose is recommended after age 81. Talk to your health care provider  about which screenings and vaccines you need and  how often you need them. This information is not intended to replace advice given to you by your health care provider. Make sure you discuss any questions you have with your health care provider. Document Released: 10/25/2015 Document Revised: 06/17/2016 Document Reviewed: 07/30/2015 Elsevier Interactive Patient Education  2017 [place] Prevention in the Home Falls can cause injuries. They can happen to people of all ages. There are many things you can do to make your home safe and to help prevent falls. What can I do on the outside of my home? Regularly fix the edges of walkways and driveways and fix any cracks. Remove anything that might make you trip as you walk through a door, such as a raised step or threshold. Trim any bushes or trees on the path to your home. Use bright outdoor lighting. Clear any walking paths of anything that might make someone trip, such as rocks or tools. Regularly check to see if handrails are loose or broken. Make sure that both sides of any steps have handrails. Any raised decks and porches should have guardrails on the edges. Have any leaves, snow, or ice cleared regularly. Use sand or salt on walking paths during winter. Clean up any spills in your garage right away. This includes oil or grease spills. What can I do in the bathroom? Use night lights. Install grab bars by the toilet and in the tub and shower. Do not use towel bars as grab bars. Use non-skid mats or decals in the tub or shower. If you need to sit down in the shower, use a plastic, non-slip stool. Keep the floor dry. Clean up any water that spills on the floor as soon as it happens. Remove soap buildup in the tub or shower regularly. Attach bath mats securely with double-sided non-slip rug tape. Do not have throw rugs and other things on the floor that can make you trip. What can I do in the bedroom? Use night lights. Make sure that you have a light by your bed that is easy to  reach. Do not use any sheets or blankets that are too big for your bed. They should not hang down onto the floor. Have a firm chair that has side arms. You can use this for support while you get dressed. Do not have throw rugs and other things on the floor that can make you trip. What can I do in the kitchen? Clean up any spills right away. Avoid walking on wet floors. Keep items that you use a lot in easy-to-reach places. If you need to reach something above you, use a strong step stool that has a grab bar. Keep electrical cords out of the way. Do not use floor polish or wax that makes floors slippery. If you must use wax, use non-skid floor wax. Do not have throw rugs and other things on the floor that can make you trip. What can I do with my stairs? Do not leave any items on the stairs. Make sure that there are handrails on both sides of the stairs and use them. Fix handrails that are broken or loose. Make sure that handrails are as long as the stairways. Check any carpeting to make sure that it is firmly attached to the stairs. Fix any carpet that is loose or worn. Avoid having throw rugs at the top or bottom of the stairs. If you do have throw rugs, attach them to the floor  with carpet tape. Make sure that you have a light switch at the top of the stairs and the bottom of the stairs. If you do not have them, ask someone to add them for you. What else can I do to help prevent falls? Wear shoes that: Do not have high heels. Have rubber bottoms. Are comfortable and fit you well. Are closed at the toe. Do not wear sandals. If you use a stepladder: Make sure that it is fully opened. Do not climb a closed stepladder. Make sure that both sides of the stepladder are locked into place. Ask someone to hold it for you, if possible. Clearly mark and make sure that you can see: Any grab bars or handrails. First and last steps. Where the edge of each step is. Use tools that help you move  around (mobility aids) if they are needed. These include: Canes. Walkers. Scooters. Crutches. Turn on the lights when you go into a dark area. Replace any light bulbs as soon as they burn out. Set up your furniture so you have a clear path. Avoid moving your furniture around. If any of your floors are uneven, fix them. If there are any pets around you, be aware of where they are. Review your medicines with your doctor. Some medicines can make you feel dizzy. This can increase your chance of falling. Ask your doctor what other things that you can do to help prevent falls. This information is not intended to replace advice given to you by your health care provider. Make sure you discuss any questions you have with your health care provider. Document Released: 07/25/2009 Document Revised: 03/05/2016 Document Reviewed: 11/02/2014 Elsevier Interactive Patient Education  2017 [employer].

## 2022-03-23 NOTE — Progress Notes (Signed)
Virtual Visit via Telephone Note  I connected with  Luis Sweeney on 03/23/22 at  1:45 PM EDT by telephone and verified that I am speaking with the correct person using two identifiers.  Location: Patient: home Provider: [place] Persons participating in the virtual visit: [place]   I discussed the limitations, risks, security and privacy concerns of performing an evaluation and management service by telephone and the availability of in person appointments. The patient expressed understanding and agreed to proceed.  Interactive audio and video telecommunications were attempted between this nurse and patient, however failed, due to patient having technical difficulties OR patient did not have access to video capability.  We continued and completed visit with audio only.  Some vital signs may be absent or patient reported.   Luis David, LPN  Subjective:   Luis Sweeney is a 81 y.o. male who presents for Medicare Annual/Subsequent preventive examination.  Review of Systems           Objective:    There were no vitals filed for this visit. There is no height or weight on file to calculate BMI.     05/29/2021    7:00 AM 05/28/2021   11:44 PM 03/20/2021    1:23 PM 11/22/2020   12:52 AM 11/21/2020    7:46 PM 01/19/2020   11:17 AM 07/07/2016    4:06 PM  Advanced Directives  Does Patient Have a Medical Advance Directive? No No No No No No No  Would patient like information on creating a medical advance directive? No - Patient declined Yes (ED - Information included in AVS) No - Patient declined No - Patient declined No - Patient declined No - Patient declined Yes - Educational materials given    Current Medications (verified) Outpatient Encounter Medications as of 03/23/2022  Medication Sig   aspirin EC 81 MG tablet Take 81 mg by mouth daily.   celecoxib (CELEBREX) 200 MG capsule    Cholecalciferol (VITAMIN D) 125 MCG (5000 UT) CAPS Take 1 capsule by  mouth once a week.   lovastatin (MEVACOR) 40 MG tablet TAKE 1 TABLET BY MOUTH EVERY MONDAY, WEDNESDAY, AND FRIDAY   pantoprazole (PROTONIX) 40 MG tablet Take 1 tablet (40 mg total) by mouth daily as needed (GERD symptoms).   ramipril (ALTACE) 5 MG capsule TAKE 1 CAPSULE BY MOUTH TWICE DAILY   vitamin B-12 (CYANOCOBALAMIN) 1000 MCG tablet Take 1,000 mcg by mouth daily.   [DISCONTINUED] lovastatin (MEVACOR) 40 MG tablet Take by mouth.   [DISCONTINUED] ramipril (ALTACE) 5 MG capsule Take by mouth.   meloxicam (MOBIC) 15 MG tablet Take 1 tablet by oral route every day for neck pain. Take with food. (Patient not taking: Reported on 03/23/2022)   meloxicam (MOBIC) 7.5 MG tablet Take 1 tablet (7.5 mg total) by mouth daily. (Patient not taking: Reported on 03/23/2022)   methocarbamol (ROBAXIN) 500 MG tablet  (Patient not taking: Reported on 03/23/2022)   predniSONE (DELTASONE) 10 MG tablet  (Patient not taking: Reported on 03/23/2022)   No facility-administered encounter medications on file as of 03/23/2022.    Allergies (verified) Prednisone, Codeine, Lipitor [atorvastatin], Morphine, and Simvastatin   History: Past Medical History:  Diagnosis Date   Acute duodenal ulcer with hemorrhage but without obstruction    required  bld. transfusion   Anemia, iron deficiency    Arthritis    OA- pt. reports its. minor   CAD (coronary artery disease) 2004   a. s/p MI treated  with drug-eluting stent placement to the proximal left anterior descending in late 2004 // Echocardiogram 7/22: EF 45-50, mild LVH, Gr 1 DD, inf and apical HK, normal RVSF, trivial MR, dilated ascending aorta (38 mm)   CKD (chronic kidney disease), stage III ([place])    COVID-19 virus infection 10/2020   Diverticulosis of colon    Duodenal stricture - recurrent 09/21/2014   Erb's palsy    birth trauma, pt. reports he was 11lbs. + at birth    GERD (gastroesophageal reflux disease)    H/O hiatal hernia    Helicobacter pylori gastritis     recurrent   Hypercholesteremia    Hyperglycemia    Hypertension    Myocardial infarct (HCC)    NAFLD (nonalcoholic fatty liver disease) 2015   with liver cysts   Paralysis of upper limb (HCC)    R arm- birth injury   Personal history of colonic polyps    Small bowel obstruction (HCC)    Urticaria    Lat left arm   Past Surgical History:  Procedure Laterality Date   ANTERIOR CERVICAL DECOMP/DISCECTOMY FUSION  03/2013   C3/4, C4/5 HNP (kritzer)   ANTERIOR CERVICAL DECOMP/DISCECTOMY FUSION N/A 03/15/2013   Procedure: ANTERIOR CERVICAL DECOMPRESSION/DISCECTOMY FUSION 2 LEVELS;  Surgeon: Faythe Ghee, MD;  Location: [facility];  Service: Neurosurgery;  Laterality: N/A;  Cervical three-four, cervical four-five anterior cervical diskectomy fusion with a trabecular metal plus plate    CARDIAC CATHETERIZATION  2006   stent placed   CARDIAC CATHETERIZATION N/A 07/08/2016   Procedure: Left Heart Cath and Coronary Angiography;  Surgeon: Sherren Mocha, MD;  Location: [facility];  Service: Cardiovascular;  Laterality: N/A;   CATARACT EXTRACTION W/PHACO Right 11/04/2021   Procedure: CATARACT EXTRACTION PHACO AND INTRAOCULAR LENS PLACEMENT ([place]) RIGHT VIVITY LENS 7.38 00:56.1;  Surgeon: Birder Robson, MD;  Location: [place];  Service: Ophthalmology;  Laterality: Right;   CATARACT EXTRACTION W/PHACO Left 11/18/2021   Procedure: CATARACT EXTRACTION PHACO AND INTRAOCULAR LENS PLACEMENT ([facility]) LEFT VIVITY LENS 7.96 00:47.1;  Surgeon: Birder Robson, MD;  Location: [place];  Service: Ophthalmology;  Laterality: Left;  wants late arrival   COLONOSCOPY  multiple   diverticulosis, int hem, h/o adenomatous polyps [name])   COLONOSCOPY  07/2014   severe diverticulosis, no rpt needed [name])   CORONARY STENT PLACEMENT  2006   drug-eluting   ESOPHAGOGASTRODUODENOSCOPY  11/26/10   mild gastritis, duodenitis, duodenal stricture (Dr. [name])   ESOPHAGOGASTRODUODENOSCOPY   10//2015   duodenal stricture, ?recurrent ulcer [name])   HERNIA REPAIR Right 2006   Dr. Hassell Done   KNEE SURGERY     left   SHOULDER SURGERY     right   spinal cyst removal     WRIST SURGERY     XI ROBOTIC ASSISTED INGUINAL HERNIA REPAIR WITH MESH Right 05/29/2021   Procedure: XI ROBOTIC [place];  Surgeon: Herbert Pun, MD;  Location: [facility];  Service: General;  Laterality: Right;   Family History  Problem Relation Age of Onset   Diabetes Father    Heart disease Father        CHF   Kidney disease Father        kidney failure   Heart failure Father    Diabetes Sister    Hypertension Sister    Cancer Sister        pelvic mass   Hypertension Sister    Arthritis Sister    Ovarian cancer  Other    Social History   Socioeconomic History   Marital status: Married    Spouse name: Not on file   Number of children: 3   Years of education: Not on file   Highest education level: Not on file  Occupational History   Occupation: HT Warden/ranger: [employer]    Comment: metal Fabrication/Stairs/Rails commerial  Tobacco Use   Smoking status: Former    Packs/day: 1.50    Years: 3.00    Total pack years: 4.50    Types: Cigarettes    Quit date: 02/16/1985    Years since quitting: 37.1   Smokeless tobacco: Never  Vaping Use   Vaping Use: Never used  Substance and Sexual Activity   Alcohol use: Yes    Alcohol/week: 0.0 standard drinks of alcohol    Comment: two to three times a year   Drug use: No   Sexual activity: Never    Comment: last few years.  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   Social Determinants of Health   Financial  Resource Strain: [place]  (03/20/2021)   Overall Financial Resource Strain (CARDIA)    Difficulty of Paying Living Expenses: Not hard at all  Food Insecurity: No Food Insecurity (03/20/2021)   Hunger Vital Sign    Worried About Running Out of Food in the Last Year: Never true    [place] in the Last Year: Never true  Transportation Needs: No Transportation Needs (03/20/2021)   PRAPARE - Hydrologist (Medical): No    Lack of Transportation (Non-Medical): No  Physical Activity: Insufficiently Active (03/23/2022)   Exercise Vital Sign    Days of Exercise per Week: 3 days    Minutes of Exercise per Session: 30 min  Stress: No Stress Concern Present (03/23/2022)   [place]    Feeling of Stress : Only a little  Social Connections: Not on file    Tobacco Counseling Counseling given: Not Answered   Clinical Intake:  Pre-visit preparation completed: Yes  Pain : No/denies pain     Nutritional Risks: None Diabetes: No CBG done?: No Did pt. bring in CBG monitor from home?: No  How often do you need to have someone help you when you read instructions, pamphlets, or other written materials from your doctor or pharmacy?: 1 - Never  Diabetic?no  Interpreter Needed?: No  Information entered by :: Kirke Shaggy, LPN   Activities of Daily Living    05/29/2021    7:00 AM  In your present state of health, do you have any difficulty performing the following activities:  Hearing? 0  Vision? 0  Difficulty concentrating or making decisions? 0  Walking or climbing stairs? 0  Dressing or bathing? 0  Doing errands, shopping? 0    Patient Care Team: Ria Bush, MD as PCP - General (Family Medicine) Sherren Mocha, MD as PCP - Cardiology (Cardiology) Dingeldein, Remo Lipps, MD as Consulting Physician (Ophthalmology) Sherren Mocha, MD as Consulting Physician (Cardiology) Karie Chimera, MD as Consulting Physician (Neurosurgery) Liliane Shi, [place]  as Librarian, academic (Cardiology) Charlton Haws, [facility] as Pharmacist (Pharmacist)  Indicate any recent Medical Services you may have received from other than [facility] providers in the past year (date may be approximate).     Assessment:   This is a routine wellness examination for [place].  Hearing/Vision screen No results found.  Dietary issues and exercise activities discussed:     Goals Addressed             This Visit's Progress    DIET - EAT MORE FRUITS AND VEGETABLES         Depression Screen    03/23/2022    1:53 PM 03/20/2021    1:25 PM 01/19/2020   11:18 AM 12/12/2018   11:14 AM 03/03/2017   10:26 AM 03/03/2017    9:05 AM 02/20/2016    8:33 AM  PHQ 2/9 Scores  PHQ - 2 Score 0 0 0 0 0 0 0  PHQ- 9 Score 0 0 0        Fall Risk    03/20/2021    1:25 PM 01/19/2020   11:18 AM 12/12/2018   11:14 AM 03/03/2017   10:26 AM 03/03/2017    9:05 AM  Fall Risk   Falls in the past year? 0 0 0 No No  Number falls in past yr: 0 0     Injury with Fall? 0 0     Risk for fall due to : Medication side effect Medication side effect     Follow up Falls evaluation completed;Falls prevention discussed Falls evaluation completed;Falls prevention discussed       FALL RISK PREVENTION PERTAINING TO THE HOME:  Any stairs in or around the home? No  If so, are there any without handrails? No  Home free of loose throw rugs in walkways, pet beds, electrical cords, etc? Yes  Adequate lighting in your home to reduce risk of falls? Yes   ASSISTIVE DEVICES UTILIZED TO PREVENT FALLS:  Life alert? No  Use of a cane, walker or w/c? No  Grab bars in the bathroom? No  Shower chair or bench in shower? Yes  Elevated toilet seat or a handicapped toilet? Yes    Cognitive Function: declined test 2023       03/20/2021    1:26 PM 01/19/2020   11:20 AM 02/20/2016    8:40 AM  MMSE - Mini Mental State Exam  Not completed: Refused     Orientation to time  5 5  Orientation to Place  5 5  Registration  3 3  Attention/ Calculation  5 0  Recall  3 3  Language- name 2 objects   0  Language- repeat  1 1  Language- follow 3 step command   3  Language- read & follow direction   0  Write a sentence   0  Copy design   0  Total score   20        Immunizations Immunization History  Administered Date(s) Administered   Influenza Whole 07/10/2008   Moderna Sars-Covid-2 Vaccination 12/11/2019, 01/08/2020   Pneumococcal Conjugate-13 02/20/2015   Pneumococcal Polysaccharide-23 10/12/2002, 07/10/2008   Td 11/13/2000, 05/31/2012    TDAP status: Up to date  Flu Vaccine status: Declined, Education has been provided regarding the importance of this vaccine but patient still declined. Advised may receive this vaccine at local pharmacy or Health Dept. Aware to provide a copy of the vaccination record if obtained from local pharmacy or Health Dept. Verbalized acceptance and  understanding.  Pneumococcal vaccine status: Up to date  Covid-19 vaccine status: Completed vaccines  Qualifies for Shingles Vaccine? Yes   Zostavax completed No   Shingrix Completed?: No.    Education has been provided regarding the importance of this vaccine. Patient has been advised to call insurance company to determine out of pocket expense if they have not yet received this vaccine. Advised may also receive vaccine at local pharmacy or Health Dept. Verbalized acceptance and understanding.  Screening Tests Health Maintenance  Topic Date Due   Zoster Vaccines- Shingrix (1 of 2) Never done   OPHTHALMOLOGY EXAM  03/27/2010   FOOT EXAM  03/03/2018   COVID-19 Vaccine (3 - Moderna series) 03/04/2020   HEMOGLOBIN A1C  07/23/2020   INFLUENZA VACCINE  05/12/2022   TETANUS/TDAP  05/31/2022   Pneumonia Vaccine 15+ Years old  Completed   HPV VACCINES  Aged Out    Health Maintenance  Health Maintenance Due  Topic Date Due   Zoster Vaccines- Shingrix  (1 of 2) Never done   OPHTHALMOLOGY EXAM  03/27/2010   FOOT EXAM  03/03/2018   COVID-19 Vaccine (3 - Moderna series) 03/04/2020   HEMOGLOBIN A1C  07/23/2020    Colorectal cancer screening: No longer required.   Lung Cancer Screening: (Low Dose CT Chest recommended if Age 20-80 years, 30 pack-year currently smoking OR have quit w/in 15years.) does not qualify.   Additional Screening:  Hepatitis C Screening: does not qualify; Completed no  Vision Screening: Recommended annual ophthalmology exams for early detection of glaucoma and other disorders of the eye. Is the patient up to date with their annual eye exam?  Yes  Who is the provider or what is the name of the office in which the patient attends annual eye exams? [facility] If pt is not established with a provider, would they like to be referred to a provider to establish care? No .   Dental Screening: Recommended annual dental exams for proper oral hygiene  Community Resource Referral / Chronic Care Management: CRR required this visit?  No   CCM required this visit?  No      Plan:     I have personally reviewed and noted the following in the patient's chart:   Medical and social history Use of alcohol, tobacco or illicit drugs  Current medications and supplements including opioid prescriptions. Patient is not currently taking opioid prescriptions. Functional ability and status Nutritional status Physical activity Advanced directives List of other physicians Hospitalizations, surgeries, and ER visits in previous 12 months Vitals Screenings to include cognitive, depression, and falls Referrals and appointments  In addition, I have reviewed and discussed with patient certain preventive protocols, quality metrics, and best practice recommendations. A written personalized care plan for preventive services as well as general preventive health recommendations were provided to patient.     Luis David,  LPN   1/66/0630   Nurse Notes: none

## 2022-04-21 ENCOUNTER — Telehealth: Payer: PPO

## 2022-04-22 ENCOUNTER — Telehealth: Payer: Self-pay

## 2022-04-22 NOTE — Chronic Care Management (AMB) (Signed)
    Chronic Care Management Pharmacy Assistant   Name: Luis Sweeney  MRN: [id number] DOB: 14-Oct-1940  Reason for Encounter: Reminder Call   Conditions to be addressed/monitored: CHF, CAD, HTN, HLD, DMII, and CKD Stage 3  Medications: Outpatient Encounter Medications as of 04/22/2022  Medication Sig Note   aspirin EC 81 MG tablet Take 81 mg by mouth daily.    celecoxib (CELEBREX) 200 MG capsule     Cholecalciferol (VITAMIN D) 125 MCG (5000 UT) CAPS Take 1 capsule by mouth once a week. 05/29/2021: Takes on sundays   lovastatin (MEVACOR) 40 MG tablet TAKE 1 TABLET BY MOUTH EVERY MONDAY, WEDNESDAY, AND FRIDAY    meloxicam (MOBIC) 15 MG tablet Take 1 tablet by oral route every day for neck pain. Take with food. (Patient not taking: Reported on 03/23/2022)    meloxicam (MOBIC) 7.5 MG tablet Take 1 tablet (7.5 mg total) by mouth daily. (Patient not taking: Reported on 03/23/2022)    methocarbamol (ROBAXIN) 500 MG tablet  (Patient not taking: Reported on 03/23/2022)    pantoprazole (PROTONIX) 40 MG tablet Take 1 tablet (40 mg total) by mouth daily as needed (GERD symptoms).    predniSONE (DELTASONE) 10 MG tablet  (Patient not taking: Reported on 03/23/2022)    ramipril (ALTACE) 5 MG capsule TAKE 1 CAPSULE BY MOUTH TWICE DAILY    vitamin B-12 (CYANOCOBALAMIN) 1000 MCG tablet Take 1,000 mcg by mouth daily.    No facility-administered encounter medications on file as of 04/22/2022.   Luis Sweeney was contacted to remind of upcoming telephone visit with Charlene Brooke  on 04/27/22 at 2:15pm. Patient was reminded to have any blood glucose and blood pressure readings available for review at appointment.   Patient confirmed appointment.   Are you having any problems with your medications? No   Do you have any concerns you like to discuss with the pharmacist? No   CCM referral has been placed prior to visit?  No    Star Rating Drugs: Medication:  Last Fill: Day Supply  Ramipril  '5mg'$   03/03/22 90 Lovastatin '40mg'$  03/17/21  90 Only takes statin 2-3 days a week   Charlene Brooke, CPP notified  Avel Sensor, [place]  [phone]

## 2022-04-27 ENCOUNTER — Ambulatory Visit: Payer: PPO | Admitting: Pharmacist

## 2022-04-27 DIAGNOSIS — N1832 Chronic kidney disease, stage 3b: Secondary | ICD-10-CM

## 2022-04-27 DIAGNOSIS — E118 Type 2 diabetes mellitus with unspecified complications: Secondary | ICD-10-CM

## 2022-04-27 DIAGNOSIS — I10 Essential (primary) hypertension: Secondary | ICD-10-CM

## 2022-04-27 DIAGNOSIS — E1169 Type 2 diabetes mellitus with other specified complication: Secondary | ICD-10-CM

## 2022-04-27 DIAGNOSIS — I2510 Atherosclerotic heart disease of native coronary artery without angina pectoris: Secondary | ICD-10-CM

## 2022-04-27 NOTE — Patient Instructions (Signed)
Visit Information  Phone number for Pharmacist: [phone]   Goals Addressed   None     Care Plan : [place]  Updates made by Charlton Haws, RPH since 04/27/2022 12:00 AM     Problem: Hypertension, Hyperlipidemia, Diabetes, Heart Failure, Coronary Artery Disease, GERD, and Chronic Kidney Disease   Priority: High     Long-Range Goal: Disease mgmt   Start Date: 10/23/2021  Expected End Date: 10/23/2022  This Visit's Progress: Not on track  Recent Progress: On track  Priority: High  Note:    Current Barriers:  Unable to independently monitor therapeutic efficacy  Pharmacist Clinical Goal(s):  Patient will achieve adherence to monitoring guidelines and medication adherence to achieve therapeutic efficacy through collaboration with PharmD and provider.   Interventions: 1:1 collaboration with Ria Bush, MD regarding development and update of comprehensive plan of care as evidenced by provider attestation and co-signature Inter-disciplinary care team collaboration (see longitudinal plan of care) Comprehensive medication review performed; medication list updated in electronic medical record  Hypertension / Heart Failure (BP goal <130/80) -Controlled - BP at goal in office; he is not monitoring at home -Last ejection fraction: 45-50% (Date: 05/06/21) -HF type: Left Ventricular Failure, Grade 1 diastolic dysfunction -Current home BP readings: n/a -Current treatment: Ramipril 5 mg BID - Appropriate, Effective, Safe, Accessible -Medications previously tried: carvedilol  -Denies hypotensive/hypertensive symptoms -Educated on BP goals and benefits of medications for prevention of heart attack, stroke and kidney damage;Daily salt intake goal < 2300 mg; -Educated on Benefits of medications for managing symptoms and prolonging life -Counseled to monitor BP at home periodically -Recommended to continue current medication  Hyperlipidemia / CAD (LDL goal <  70) -Unable to assess- LDL was at goal 01/2020 but has not been checked since; pt endorses compliance with statin 1-2 times weekly, he was dispensed 40 tablets in June 2022 and still has ~10 left -Hx MI 2004 s/p DES; cath 06/2016 w/ patent stent -Current treatment: Lovastatin 40 mg MWF - Appropriate, Query Effective Aspirin 81 mg daily - Appropriate, Effective, Safe, Accessible -Medications previously tried: rosuvastatin 5, pravastatin, simvastatin, atorvastatin -Educated on Cholesterol goals; Benefits of statin for ASCVD risk reduction; -Recommended to continue current medication; repeat lipid panel due at next OV  Diabetes (A1c goal <7%) -Diet-controlled; A1c at goal 01/2020, pt is overdue for repeat A1c -Educated on A1c and blood sugar goals; -Counseled on diet and exercise extensively -Pt is due for annual PCP visit, scheduled CPE  CKD Stage 3b (Goal: slow progression) -Not ideally controlled - pt endorses limited water intake but does drink a lot of diet pepsi -Current treatment  Ramipril 5 mg BID - Appropriate, Effective, Safe, Accessible -Discussed importance of hydration and BP control to prevent progression of DM -Can consider SGLT2 or [place] Clamp, pt is not keen on adding new medication especially brand-name -Recommended to continue current medication  GERD (Goal: manage symptoms) -Controlled - has not taken PPI months -Hx SBO 11/2020 and s/p hernia repair 05/2021. -Current treatment  Pantoprazole 40 mg PRN -Recommended to continue current medication PRN  Health Maintenance -Vaccine gaps: Flu, covid booster, Shingrix -Pt declines vaccines  Patient Goals/Self-Care Activities Patient will:  - take medications as prescribed as evidenced by patient report and record review focus on medication adherence by routine check blood pressure periodically, document, and provide at future appointments engage in dietary modifications by increasing water intake       The patient  verbalized understanding of instructions, educational materials, and care plan  provided today and DECLINED offer to receive copy of patient instructions, educational materials, and care plan.  Telephone follow up appointment with pharmacy team member scheduled for: 6 months  Charlene Brooke, PharmD, [facility] Clinical Pharmacist [facility] [phone]

## 2022-04-27 NOTE — Progress Notes (Signed)
Chronic Care Management Pharmacy Note  04/27/2022 Name:  Luis Sweeney MRN:  [id number] DOB:  [birth year]  Summary: CCM F/U visit -Pt endorses compliance with medications except he does miss doses of lovastatin (prescribed 3x weekly, he takes it 1-2x weekly) -Pt is due for PCP visit, repeat labwork (A1c, lipids, renal function) -Counseled on CKD and importance of hydration/BP control  Recommendations/Changes made from today's visit: -Advised to use a pill box to help with 3x weekly lovastatin -Scheduled PCP CPE appt  Plan: -[place] will call patient 2 months for general adherence -Pharmacist follow up televisit scheduled for 6 months -PCP CPE 07/22/22   Subjective: Luis Sweeney is an 81 y.o. year old male who is a primary patient of Ria Bush, MD.  The CCM team was consulted for assistance with disease management and care coordination needs.    Engaged with patient by telephone for follow up visit in response to provider referral for pharmacy case management and/or care coordination services.   Consent to Services:  The patient was given information about Chronic Care Management services, agreed to services, and gave verbal consent prior to initiation of services.  Please see initial visit note for detailed documentation.   Patient Care Team: Ria Bush, MD as PCP - General (Family Medicine) Sherren Mocha, MD as PCP - Cardiology (Cardiology) Dingeldein, Remo Lipps, MD as Consulting Physician (Ophthalmology) Sherren Mocha, MD as Consulting Physician (Cardiology) Karie Chimera, MD as Consulting Physician (Neurosurgery) Sharmon Revere as Physician Assistant (Cardiology) Charlton Haws, [facility] as Pharmacist (Pharmacist)  Recent office visits: 03/23/22 LPN - AWV. 11/12/60 Andrez Grime LPN  - AWV 01/14/68 Dr Danise Mina OV: c/o dysuria s/p treatment at urgent care which returned negative culture. UA negative. No med changes.  Recent consult  visits: 11/14/21 Urgent care - back pain. Rx'd meloxicam and methocarbamol.  Hospital visits: Admission 05/29/21-05/31/21 for inguinal hernia.  Admission 11/21/20-11/23/20 for SBO.  Objective:  Lab Results  Component Value Date   CREATININE 2.14 (H) 05/28/2021   BUN 29 (H) 05/28/2021   GFR 37.18 (L) 12/13/2020   GFRNONAA 31 (L) 05/28/2021   GFRAA 46 (L) 09/29/2018   NA 135 05/28/2021   K 4.5 05/28/2021   CALCIUM 9.4 05/28/2021   CO2 23 05/28/2021   GLUCOSE 158 (H) 05/28/2021    Lab Results  Component Value Date/Time   HGBA1C 6.5 01/22/2020 09:30 AM   HGBA1C 6.7 (H) 05/22/2019 10:40 AM   GFR 37.18 (L) 12/13/2020 11:32 AM   GFR 37.03 (L) 01/22/2020 09:30 AM   MICROALBUR <0.7 02/20/2016 08:49 AM   MICROALBUR 0.2 06/10/2009 09:22 AM    Last diabetic Eye exam:  Lab Results  Component Value Date/Time   HMDIABEYEEXA Done 03/27/2009 12:00 AM    Last diabetic Foot exam: No results found for: "HMDIABFOOTEX"   Lab Results  Component Value Date   CHOL 115 01/22/2020   HDL 37.50 (L) 01/22/2020   LDLCALC 70 01/22/2020   TRIG 35.0 01/22/2020   CHOLHDL 3 01/22/2020       Latest Ref Rng & Units 05/28/2021   11:47 PM 11/22/2020    5:05 AM 11/21/2020    7:49 PM  Hepatic Function  Total Protein 6.5 - 8.1 g/dL 7.2  5.9  8.2   Albumin 3.5 - 5.0 g/dL 4.1  3.1  4.4   AST 15 - 41 U/L _0 ALT 0 - 44 U/L _1 Alk Phosphatase 38 -  126 U/L 35  25  33   Total Bilirubin 0.3 - 1.2 mg/dL 0.5  0.6  1.0   Bilirubin, Direct 0.0 - 0.2 mg/dL 0.1       Lab Results  Component Value Date/Time   TSH 2.10 04/16/2011 08:26 AM   TSH 2.19 01/04/2008 08:47 AM       Latest Ref Rng & Units 05/31/2021    4:24 AM 05/28/2021   11:47 PM 12/13/2020   11:32 AM  CBC  WBC 4.0 - 10.5 K/uL 10.3  8.3  7.0   Hemoglobin 13.0 - 17.0 g/dL 13.2  14.7  13.2   Hematocrit 39.0 - 52.0 % 40.0  42.8  39.1   Platelets 150 - 400 K/uL 203  217  237.0     Lab Results  Component Value Date/Time   VD25OH  50.34 01/22/2020 09:30 AM   VD25OH 65.06 12/12/2018 12:23 PM    Clinical ASCVD: Yes  The ASCVD Risk score (Arnett DK, et al., 2019) failed to calculate for the following reasons:   The 2019 ASCVD risk score is only valid for ages 76 to 68   The patient has a prior MI or stroke diagnosis       03/23/2022    1:53 PM 03/20/2021    1:25 PM 01/19/2020   11:18 AM  Depression screen PHQ 2/9  Decreased Interest 0 0 0  Down, Depressed, Hopeless 0 0 0  PHQ - 2 Score 0 0 0  Altered sleeping 0 0 0  Tired, decreased energy 0 0 0  Change in appetite 0 0 0  Feeling bad or failure about yourself  0 0 0  Trouble concentrating 0 0 0  Moving slowly or fidgety/restless 0 0 0  Suicidal thoughts 0 0 0  PHQ-9 Score 0 0 0  Difficult doing work/chores Not difficult at all Not difficult at all Not difficult at all     Social History   Tobacco Use  Smoking Status Former   Packs/day: 1.50   Years: 3.00   Total pack years: 4.50   Types: Cigarettes   Quit date: 02/16/1985   Years since quitting: 37.2  Smokeless Tobacco Never   BP Readings from Last 3 Encounters:  11/18/21 134/74  11/14/21 129/64  11/04/21 126/63   Pulse Readings from Last 3 Encounters:  11/18/21 75  11/14/21 68  11/04/21 74   Wt Readings from Last 3 Encounters:  03/23/22 240 lb (108.9 kg)  11/18/21 240 lb 4.8 oz (109 kg)  11/04/21 241 lb (109.3 kg)   BMI Readings from Last 3 Encounters:  03/23/22 30.81 kg/m  11/18/21 30.85 kg/m  11/04/21 30.94 kg/m    Assessment/Interventions: Review of patient past medical history, allergies, medications, health status, including review of consultants reports, laboratory and other test data, was performed as part of comprehensive evaluation and provision of chronic care management services.   SDOH:  (Social Determinants of Health) assessments and interventions performed: No - done June 2023 AWV  SDOH Screenings   Alcohol Screen: Low Risk  (03/23/2022)   Alcohol Screen    Last  Alcohol Screening Score (AUDIT): 0  Depression (PHQ2-9): Low Risk  (03/23/2022)   Depression (PHQ2-9)    PHQ-2 Score: 0  Financial Resource Strain: Low Risk  (03/23/2022)   Overall Financial Resource Strain (CARDIA)    Difficulty of Paying Living Expenses: Not hard at all  Food Insecurity: No Food Insecurity (03/23/2022)   Hunger Vital Sign    Worried About Running Out  of Food in the Last Year: Never true    [place] in the Last Year: Never true  Housing: Low Risk  (03/23/2022)   Housing    Last Housing Risk Score: 0  Physical Activity: Insufficiently Active (03/23/2022)   Exercise Vital Sign    Days of Exercise per Week: 3 days    Minutes of Exercise per Session: 30 min  Social Connections: Moderately Integrated (03/23/2022)   Social Connection and Isolation Panel [NHANES]    Frequency of Communication with Friends and Family: More than three times a week    Frequency of Social Gatherings with Friends and Family: Once a week    Attends Religious Services: More than 4 times per year    Active Member of Clubs or Organizations: No    Attends Archivist Meetings: Never    Marital Status: Married  Stress: No Stress Concern Present (03/23/2022)   [employer] [place]    Feeling of Stress : Only a little  Tobacco Use: Medium Risk (03/23/2022)   Patient History    Smoking Tobacco Use: Former    Smokeless Tobacco Use: Never    Passive Exposure: Not on file  Transportation Needs: No Transportation Needs (03/23/2022)   PRAPARE - Hydrologist (Medical): No    Lack of Transportation (Non-Medical): No    CCM Care Plan  Allergies  Allergen Reactions   Prednisone Other (See Comments)    Feels really bad   Codeine Hives and Itching   Lipitor [Atorvastatin] Other (See Comments)    Reaction:  Muscle pain    Morphine Hives and Itching   Simvastatin Other (See Comments)    Reaction:  Muscle pain      Medications Reviewed Today     Reviewed by Charlton Haws, [facility] (Pharmacist) on 04/27/22 at 1441  Med List Status: <None>   Medication Order Taking? Sig Documenting Provider Last Dose Status Informant  aspirin EC 81 MG tablet [id number] Yes Take 81 mg by mouth daily. [provider] Taking Active Self  celecoxib (CELEBREX) 200 MG capsule [id number] Yes  [provider] Taking Active   Cholecalciferol (VITAMIN D) 125 MCG (5000 UT) CAPS [id number] Yes Take 1 capsule by mouth once a week. Ria Bush, MD Taking Active Self           Med Note Charlyne Mom May 29, 2021  2:09 AM) Dewaine Conger on sundays  lovastatin (MEVACOR) 40 MG tablet [id number] Yes TAKE 1 TABLET BY MOUTH EVERY MONDAY, WEDNESDAY, AND FRIDAY  Patient taking differently: Take 40 mg by mouth. 1-2 times weekly   Ria Bush, MD Taking Active Self  meloxicam [facility]) 7.5 MG tablet [id number] Yes Take 1 tablet (7.5 mg total) by mouth daily. Marney Setting, NP Taking Active   methocarbamol (ROBAXIN) 500 MG tablet [id number] Yes  [provider] Taking Active   pantoprazole (PROTONIX) 40 MG tablet [id number] Yes Take 1 tablet (40 mg total) by mouth daily as needed (GERD symptoms). Ria Bush, MD Taking Active   ramipril (ALTACE) 5 MG capsule [id number] Yes TAKE 1 CAPSULE BY MOUTH TWICE DAILY Sherren Mocha, MD Taking Active   vitamin B-12 (CYANOCOBALAMIN) 1000 MCG tablet [id number] Yes Take 1,000 mcg by mouth daily. [provider] Taking Active Self            Patient Active Problem List   Diagnosis Date Noted   Incarcerated inguinal hernia, unilateral  05/29/2021   Dilatation of thoracic aorta ([place]) 05/11/2021   CHF (congestive heart failure) ([place]) 05/11/2021   SBO (small bowel obstruction) ([place]) 11/21/2020   Acute renal failure superimposed on stage 3 chronic kidney disease ([place]) 11/21/2020   Hyperkalemia 11/21/2020   Leukocytosis 11/21/2020   Primary  osteoarthritis of right hip 06/19/2020   Lumbar spondylosis 07/04/2019   Lumbar radiculopathy 05/31/2019   Acute left-sided low back pain with left-sided sciatica 05/17/2019   Essential hypertension 09/27/2018   Health maintenance examination 02/25/2016   Controlled diabetes mellitus type 2 with complications ([place]) 66/44/0347   Irregular heart beat 02/25/2016   Neck pain 01/17/2016   Advanced care planning/counseling discussion 02/20/2015   Obesity, Class I, BMI 30-34.9 02/20/2015   Duodenal stricture - recurrent 09/21/2014   Hx of peptic ulcer 06/11/2014   NAFLD (nonalcoholic fatty liver disease)    Medicare annual wellness visit, subsequent 05/31/2012   Vitamin B12 deficiency 05/31/2012   Recurrent right inguinal hernia 05/31/2012   Dysuria 11/26/2010   Coronary atherosclerosis of native coronary artery 03/13/2009   CKD stage 3 due to type 2 diabetes mellitus ([place]) 03/13/2009   Hyperlipidemia associated with type 2 diabetes mellitus ([place]) 01/04/2008   History of colonic polyps 11/07/2007   GERD 03/08/2007   MYOCARDIAL INFARCTION, HX OF 03/07/2007   ERB'S PALSY 03/07/2007    Immunization History  Administered Date(s) Administered   Influenza Whole 07/10/2008   Moderna Sars-Covid-2 Vaccination 12/11/2019, 01/08/2020   Pneumococcal Conjugate-13 02/20/2015   Pneumococcal Polysaccharide-23 10/12/2002, 07/10/2008   Td 11/13/2000, 05/31/2012    Conditions to be addressed/monitored:  Hypertension, Hyperlipidemia, Diabetes, Heart Failure, Coronary Artery Disease, GERD, and Chronic Kidney Disease  Care Plan : [place]  Updates made by Charlton Haws, [place] since 04/27/2022 12:00 AM     Problem: Hypertension, Hyperlipidemia, Diabetes, Heart Failure, Coronary Artery Disease, GERD, and Chronic Kidney Disease   Priority: High     Long-Range Goal: Disease mgmt   Start Date: 10/23/2021  Expected End Date: 10/23/2022  This Visit's Progress: Not on track  Recent  Progress: On track  Priority: High  Note:    Current Barriers:  Unable to independently monitor therapeutic efficacy  Pharmacist Clinical Goal(s):  Patient will achieve adherence to monitoring guidelines and medication adherence to achieve therapeutic efficacy through collaboration with PharmD and provider.   Interventions: 1:1 collaboration with Ria Bush, MD regarding development and update of comprehensive plan of care as evidenced by provider attestation and co-signature Inter-disciplinary care team collaboration (see longitudinal plan of care) Comprehensive medication review performed; medication list updated in electronic medical record  Hypertension / Heart Failure (BP goal <130/80) -Controlled - BP at goal in office; he is not monitoring at home -Last ejection fraction: 45-50% (Date: 05/06/21) -HF type: Left Ventricular Failure, Grade 1 diastolic dysfunction -Current home BP readings: n/a -Current treatment: Ramipril 5 mg BID - Appropriate, Effective, Safe, Accessible -Medications previously tried: carvedilol  -Denies hypotensive/hypertensive symptoms -Educated on BP goals and benefits of medications for prevention of heart attack, stroke and kidney damage;Daily salt intake goal < 2300 mg; -Educated on Benefits of medications for managing symptoms and prolonging life -Counseled to monitor BP at home periodically -Recommended to continue current medication  Hyperlipidemia / CAD (LDL goal < 70) -Unable to assess- LDL was at goal 01/2020 but has not been checked since; pt endorses compliance with statin 1-2 times weekly, he was dispensed 40 tablets in June 2022 and still has ~10 left -Hx MI 2004 s/p DES;  cath 06/2016 w/ patent stent -Current treatment: Lovastatin 40 mg MWF - Appropriate, Query Effective Aspirin 81 mg daily - Appropriate, Effective, Safe, Accessible -Medications previously tried: rosuvastatin 5, pravastatin, simvastatin, atorvastatin -Educated on  Cholesterol goals; Benefits of statin for ASCVD risk reduction; -Recommended to continue current medication; repeat lipid panel due at next OV  Diabetes (A1c goal <7%) -Diet-controlled; A1c at goal 01/2020, pt is overdue for repeat A1c -Educated on A1c and blood sugar goals; -Counseled on diet and exercise extensively -Pt is due for annual PCP visit, scheduled CPE  CKD Stage 3b (Goal: slow progression) -Not ideally controlled - pt endorses limited water intake but does drink a lot of diet pepsi -Current treatment  Ramipril 5 mg BID - Appropriate, Effective, Safe, Accessible -Discussed importance of hydration and BP control to prevent progression of DM -Can consider SGLT2 or [place] Clamp, pt is not keen on adding new medication especially brand-name -Recommended to continue current medication  GERD (Goal: manage symptoms) -Controlled - has not taken PPI months -Hx SBO 11/2020 and s/p hernia repair 05/2021. -Current treatment  Pantoprazole 40 mg PRN -Recommended to continue current medication PRN  Health Maintenance -Vaccine gaps: Flu, covid booster, Shingrix -Pt declines vaccines  Patient Goals/Self-Care Activities Patient will:  - take medications as prescribed as evidenced by patient report and record review focus on medication adherence by routine check blood pressure periodically, document, and provide at future appointments engage in dietary modifications by increasing water intake        Medication Assistance: None required.  Patient affirms current coverage meets needs.  Compliance/Adherence/Medication fill history: Care Gaps: Eye exam (due 03/27/10) Foot exam (due 03/03/18) A1c (due 07/23/20)  Star-Rating Drugs: Lovastatin - LF 03/17/21 x 90 ds PDC 51% --pt endorses occasional missed doses of 3x weekly dosing Ramipril - LF 07/25/21 x 90 ds; PDC 100%  Medication Access: Within the past 30 days, how often has patient missed a dose of medication? Weekly - not taking  statin as prescribed Is a pillbox or other method used to improve adherence? No  Factors that may affect medication adherence? lack of understanding of disease management Are meds synced by current pharmacy? No  Are meds delivered by current pharmacy? No  Does patient experience delays in picking up medications due to transportation concerns? No   Upstream Services Reviewed: Is patient disadvantaged to use [facility]?: No  Current Rx insurance plan: HTA Name and location of Current pharmacy:  [place], [place] [address] [place] [address] Phone: [phone] Fax: [phone]  [facility] services reviewed with patient today?: No  Patient requests to transfer care to [facility] Pharmacy?: No  Reason patient declined to change pharmacies: Loyalty to other pharmacy/Patient preference   Care Plan and Follow Up Patient Decision:  Patient agrees to Care Plan and Follow-up.  Plan: Telephone follow up appointment with care management team member scheduled for:  6 months  Charlene Brooke, PharmD, BCACP Clinical Pharmacist [facility] [phone]

## 2022-05-02 ENCOUNTER — Other Ambulatory Visit: Payer: Self-pay | Admitting: Family Medicine

## 2022-05-05 ENCOUNTER — Ambulatory Visit (HOSPITAL_COMMUNITY): Payer: PPO | Attending: Internal Medicine

## 2022-05-05 DIAGNOSIS — I2510 Atherosclerotic heart disease of native coronary artery without angina pectoris: Secondary | ICD-10-CM | POA: Insufficient documentation

## 2022-05-05 DIAGNOSIS — I25110 Atherosclerotic heart disease of native coronary artery with unstable angina pectoris: Secondary | ICD-10-CM | POA: Diagnosis not present

## 2022-05-05 DIAGNOSIS — I7121 Aneurysm of the ascending aorta, without rupture: Secondary | ICD-10-CM

## 2022-05-05 LAB — ECHOCARDIOGRAM COMPLETE
Area-P 1/2: 3.27 cm2
Calc EF: 48 %
S' Lateral: 3.8 cm
Single Plane A2C EF: 51 %
Single Plane A4C EF: 45.5 %

## 2022-06-05 ENCOUNTER — Other Ambulatory Visit: Payer: Self-pay | Admitting: Cardiovascular Disease

## 2022-06-10 ENCOUNTER — Telehealth: Payer: Self-pay | Admitting: Cardiovascular Disease

## 2022-06-10 MED ORDER — RAMIPRIL 5 MG PO CAPS: 5.0000 mg | ORAL_CAPSULE | Freq: Two times a day (BID) | ORAL | 0 refills | Status: DC

## 2022-06-10 NOTE — Telephone Encounter (Signed)
Pt's medication was sent to pt's pharmacy as requested. Confirmation received.  °

## 2022-06-10 NOTE — Telephone Encounter (Signed)
  *  STAT* If patient is at the pharmacy, call can be transferred to refill team.   1. Which medications need to be refilled? (please list name of each medication and dose if known) ramipril (ALTACE) 5 MG capsule  2. Which pharmacy/location (including street and city if local pharmacy) is medication to be sent to?[place], [place]  3. Do they need a 30 day or 90 day supply? 90 days  Pt made an appt with Dr.Cooper on 10/27. He only got 15 days supply of this medication. He is requesting the 90 days supply

## 2022-06-24 ENCOUNTER — Telehealth: Payer: Self-pay

## 2022-06-24 NOTE — Chronic Care Management (AMB) (Signed)
Chronic Care Management Pharmacy Assistant   Name: MELVEN STOCKARD  MRN: [id number] DOB: [birth year]  Reason for Encounter: General Adherence    Recent office visits:  None since last CCM contact  Recent consult visits:  None since last CCM contact  Hospital visits:  None in previous 6 months  Medications: Outpatient Encounter Medications as of 06/24/2022  Medication Sig Note   aspirin EC 81 MG tablet Take 81 mg by mouth daily.    celecoxib (CELEBREX) 200 MG capsule     Cholecalciferol (VITAMIN D) 125 MCG (5000 UT) CAPS Take 1 capsule by mouth once a week. 05/29/2021: Takes on sundays   lovastatin (MEVACOR) 40 MG tablet TAKE 1 TABLET BY MOUTH EVERY MONDAY, WEDNESDAY, AND FRIDAY    meloxicam (MOBIC) 7.5 MG tablet Take 1 tablet (7.5 mg total) by mouth daily.    methocarbamol (ROBAXIN) 500 MG tablet     pantoprazole (PROTONIX) 40 MG tablet Take 1 tablet (40 mg total) by mouth daily as needed (GERD symptoms).    ramipril (ALTACE) 5 MG capsule Take 1 capsule (5 mg total) by mouth 2 (two) times daily.    vitamin B-12 (CYANOCOBALAMIN) 1000 MCG tablet Take 1,000 mcg by mouth daily.    No facility-administered encounter medications on file as of 06/24/2022.    Recent Office Vitals: BP Readings from Last 3 Encounters:  11/18/21 134/74  11/14/21 129/64  11/04/21 126/63   Pulse Readings from Last 3 Encounters:  11/18/21 75  11/14/21 68  11/04/21 74    Wt Readings from Last 3 Encounters:  03/23/22 240 lb (108.9 kg)  11/18/21 240 lb 4.8 oz (109 kg)  11/04/21 241 lb (109.3 kg)     Kidney Function Lab Results  Component Value Date/Time   CREATININE 2.14 (H) 05/28/2021 11:47 PM   CREATININE 1.72 (H) 12/13/2020 11:32 AM   CREATININE 1.84 (H) 07/20/2016 08:51 AM   CREATININE 1.80 (H) 07/10/2016 10:02 AM   GFR 37.18 (L) 12/13/2020 11:32 AM   GFRNONAA 31 (L) 05/28/2021 11:47 PM   GFRAA 46 (L) 09/29/2018 10:21 AM       Latest Ref Rng & Units 05/28/2021   11:47 PM 12/13/2020    11:32 AM 11/23/2020    7:48 AM  BMP  Glucose 70 - 99 mg/dL 158  95  102   BUN 8 - 23 mg/dL '29  25  20   '$ Creatinine 0.61 - 1.24 mg/dL 2.14  1.72  1.82   Sodium 135 - 145 mmol/L 135  139  138   Potassium 3.5 - 5.1 mmol/L 4.5  4.6  4.6   Chloride 98 - 111 mmol/L 102  105  104   CO2 22 - 32 mmol/L '23  30  26   '$ Calcium 8.9 - 10.3 mg/dL 9.4  9.1  8.5       Contacted [name] on 06/25/22 for general disease state and medication adherence call.   Patient is not more than 5 days past due for refill on the following medications per chart history:  Star Medications: Medication Name/mg Last Fill Days Supply Lovastatin '40mg'$   05/05/22 90 Ramipril '5mg'$    06/05/22 15   What concerns do you have about your medications? No concerns at this time   The patient denies side effects with their medications.   How often do you forget or accidentally miss a dose? Never  Are you having any problems getting your medications from your pharmacy? No  Has the cost of your  medications been a concern? No   Since last visit with CPP, the following interventions have been made.  Compliance with taking lovastatin 3X week - The patient reports he has not taken his BP. He does have a unit. The patient reports his daughter can take it when he feels bad. I encouraged him to monitor  his BP.  The patient has not had an ED visit since last contact.   The patient reports the following problems with their health. Patient complains of right hip and leg pain, sees orthopedist.Encouraged follow up if continues to have pain and discomfort   Patient denies concerns or questions for Charlene Brooke, PharmD at this time.   Counseled patient on:  Doristine Devoid job taking medications, Importance of taking medication daily without missed doses, and Access to CCM team for any cost, medication or pharmacy concerns.   Care Gaps: Annual wellness visit in last year? Yes Most Recent BP reading:148/66  Upcoming appointments: PCP  appointment on 07/22/22 and CCM appointment on Jan.2024  Summary of recommendations from last [place] visit (Date:04/27/22) Summary: CCM F/U visit -Pt endorses compliance with medications except he does miss doses of lovastatin (prescribed 3x weekly, he takes it 1-2x weekly) -Pt is due for PCP visit, repeat labwork (A1c, lipids, renal function) -Counseled on CKD and importance of hydration/BP control   Recommendations/Changes made from today's visit: -Advised to use a pill box to help with 3x weekly lovastatin -Scheduled PCP CPE appt   Charlene Brooke, CPP notified  Avel Sensor, [place]  [phone]

## 2022-06-29 DIAGNOSIS — M25551 Pain in right hip: Secondary | ICD-10-CM | POA: Diagnosis not present

## 2022-07-01 ENCOUNTER — Encounter: Payer: Self-pay | Admitting: Emergency Medicine

## 2022-07-01 ENCOUNTER — Other Ambulatory Visit: Payer: Self-pay

## 2022-07-01 ENCOUNTER — Emergency Department: Payer: PPO

## 2022-07-01 DIAGNOSIS — N183 Chronic kidney disease, stage 3 unspecified: Secondary | ICD-10-CM | POA: Insufficient documentation

## 2022-07-01 DIAGNOSIS — E785 Hyperlipidemia, unspecified: Secondary | ICD-10-CM | POA: Insufficient documentation

## 2022-07-01 DIAGNOSIS — Z8616 Personal history of COVID-19: Secondary | ICD-10-CM | POA: Diagnosis not present

## 2022-07-01 DIAGNOSIS — R079 Chest pain, unspecified: Secondary | ICD-10-CM | POA: Diagnosis not present

## 2022-07-01 DIAGNOSIS — E1121 Type 2 diabetes mellitus with diabetic nephropathy: Secondary | ICD-10-CM | POA: Insufficient documentation

## 2022-07-01 DIAGNOSIS — Z7982 Long term (current) use of aspirin: Secondary | ICD-10-CM | POA: Insufficient documentation

## 2022-07-01 DIAGNOSIS — Z79899 Other long term (current) drug therapy: Secondary | ICD-10-CM | POA: Insufficient documentation

## 2022-07-01 DIAGNOSIS — Z96652 Presence of left artificial knee joint: Secondary | ICD-10-CM | POA: Insufficient documentation

## 2022-07-01 DIAGNOSIS — R1011 Right upper quadrant pain: Secondary | ICD-10-CM | POA: Insufficient documentation

## 2022-07-01 DIAGNOSIS — R0789 Other chest pain: Secondary | ICD-10-CM | POA: Diagnosis not present

## 2022-07-01 DIAGNOSIS — Z87891 Personal history of nicotine dependence: Secondary | ICD-10-CM | POA: Insufficient documentation

## 2022-07-01 DIAGNOSIS — K7689 Other specified diseases of liver: Secondary | ICD-10-CM | POA: Diagnosis not present

## 2022-07-01 DIAGNOSIS — I700 Atherosclerosis of aorta: Secondary | ICD-10-CM | POA: Diagnosis not present

## 2022-07-01 DIAGNOSIS — R778 Other specified abnormalities of plasma proteins: Secondary | ICD-10-CM | POA: Insufficient documentation

## 2022-07-01 DIAGNOSIS — I5020 Unspecified systolic (congestive) heart failure: Secondary | ICD-10-CM | POA: Insufficient documentation

## 2022-07-01 DIAGNOSIS — E1122 Type 2 diabetes mellitus with diabetic chronic kidney disease: Secondary | ICD-10-CM | POA: Insufficient documentation

## 2022-07-01 DIAGNOSIS — I25110 Atherosclerotic heart disease of native coronary artery with unstable angina pectoris: Secondary | ICD-10-CM | POA: Diagnosis not present

## 2022-07-01 DIAGNOSIS — I130 Hypertensive heart and chronic kidney disease with heart failure and stage 1 through stage 4 chronic kidney disease, or unspecified chronic kidney disease: Secondary | ICD-10-CM | POA: Diagnosis not present

## 2022-07-01 DIAGNOSIS — I714 Abdominal aortic aneurysm, without rupture, unspecified: Secondary | ICD-10-CM | POA: Insufficient documentation

## 2022-07-01 DIAGNOSIS — I200 Unstable angina: Secondary | ICD-10-CM | POA: Diagnosis not present

## 2022-07-01 DIAGNOSIS — E1169 Type 2 diabetes mellitus with other specified complication: Secondary | ICD-10-CM | POA: Insufficient documentation

## 2022-07-01 LAB — CBC
HCT: 41.6 % (ref 39.0–52.0)
Hemoglobin: 13.6 g/dL (ref 13.0–17.0)
MCH: 31 pg (ref 26.0–34.0)
MCHC: 32.7 g/dL (ref 30.0–36.0)
MCV: 94.8 fL (ref 80.0–100.0)
Platelets: 230 10*3/uL (ref 150–400)
RBC: 4.39 MIL/uL (ref 4.22–5.81)
RDW: 14.2 % (ref 11.5–15.5)
WBC: 10.5 10*3/uL (ref 4.0–10.5)
nRBC: 0 % (ref 0.0–0.2)

## 2022-07-01 LAB — BASIC METABOLIC PANEL
Anion gap: 7 (ref 5–15)
BUN: 35 mg/dL — ABNORMAL HIGH (ref 8–23)
CO2: 23 mmol/L (ref 22–32)
Calcium: 9.1 mg/dL (ref 8.9–10.3)
Chloride: 107 mmol/L (ref 98–111)
Creatinine, Ser: 1.79 mg/dL — ABNORMAL HIGH (ref 0.61–1.24)
GFR, Estimated: 38 mL/min — ABNORMAL LOW (ref >60)
Glucose, Bld: 123 mg/dL — ABNORMAL HIGH (ref 70–99)
Potassium: 4.7 mmol/L (ref 3.5–5.1)
Sodium: 137 mmol/L (ref 135–145)

## 2022-07-01 LAB — TROPONIN I (HIGH SENSITIVITY)
Troponin I (High Sensitivity): 21 ng/L — ABNORMAL HIGH (ref <18)
Troponin I (High Sensitivity): 39 ng/L — ABNORMAL HIGH (ref <18)

## 2022-07-01 NOTE — ED Triage Notes (Signed)
Pt via POV from home. Pt c/o mid to R sided chest pain that started yesterday. States that it is intermittent states when it comes it takes his breath. Denies any SOB. Hx of MI in '08 but only takes a '81mg'$  ASA. Pt is A&OX4 and NAD

## 2022-07-01 NOTE — ED Triage Notes (Signed)
First Nurse Note:  C/O right lower chest pain x 1 day.  States pain radiates to right thoracic area on back.

## 2022-07-02 ENCOUNTER — Emergency Department: Payer: PPO

## 2022-07-02 ENCOUNTER — Other Ambulatory Visit: Payer: Self-pay

## 2022-07-02 ENCOUNTER — Observation Stay
Admission: EM | Admit: 2022-07-02 | Discharge: 2022-07-03 | Disposition: A | Discharge status: Home / Self Care | Payer: PPO | Attending: Internal Medicine | Admitting: Internal Medicine

## 2022-07-02 ENCOUNTER — Observation Stay (HOSPITAL_BASED_OUTPATIENT_CLINIC_OR_DEPARTMENT_OTHER): Payer: PPO

## 2022-07-02 ENCOUNTER — Encounter: Payer: Self-pay | Admitting: Family Medicine

## 2022-07-02 DIAGNOSIS — N1832 Chronic kidney disease, stage 3b: Secondary | ICD-10-CM | POA: Diagnosis not present

## 2022-07-02 DIAGNOSIS — E1121 Type 2 diabetes mellitus with diabetic nephropathy: Secondary | ICD-10-CM

## 2022-07-02 DIAGNOSIS — R079 Chest pain, unspecified: Secondary | ICD-10-CM | POA: Diagnosis present

## 2022-07-02 DIAGNOSIS — K7689 Other specified diseases of liver: Secondary | ICD-10-CM | POA: Diagnosis not present

## 2022-07-02 DIAGNOSIS — E66811 Obesity, class 1: Secondary | ICD-10-CM | POA: Diagnosis present

## 2022-07-02 DIAGNOSIS — R778 Other specified abnormalities of plasma proteins: Secondary | ICD-10-CM | POA: Diagnosis not present

## 2022-07-02 DIAGNOSIS — F419 Anxiety disorder, unspecified: Secondary | ICD-10-CM | POA: Diagnosis present

## 2022-07-02 DIAGNOSIS — I2510 Atherosclerotic heart disease of native coronary artery without angina pectoris: Secondary | ICD-10-CM | POA: Diagnosis not present

## 2022-07-02 DIAGNOSIS — E1169 Type 2 diabetes mellitus with other specified complication: Secondary | ICD-10-CM | POA: Diagnosis present

## 2022-07-02 DIAGNOSIS — R0789 Other chest pain: Secondary | ICD-10-CM

## 2022-07-02 DIAGNOSIS — Z8711 Personal history of peptic ulcer disease: Secondary | ICD-10-CM | POA: Diagnosis not present

## 2022-07-02 DIAGNOSIS — R1011 Right upper quadrant pain: Secondary | ICD-10-CM | POA: Diagnosis not present

## 2022-07-02 DIAGNOSIS — I200 Unstable angina: Secondary | ICD-10-CM | POA: Diagnosis not present

## 2022-07-02 DIAGNOSIS — E538 Deficiency of other specified B group vitamins: Secondary | ICD-10-CM

## 2022-07-02 DIAGNOSIS — I714 Abdominal aortic aneurysm, without rupture, unspecified: Secondary | ICD-10-CM | POA: Diagnosis not present

## 2022-07-02 DIAGNOSIS — I25118 Atherosclerotic heart disease of native coronary artery with other forms of angina pectoris: Secondary | ICD-10-CM

## 2022-07-02 DIAGNOSIS — E669 Obesity, unspecified: Secondary | ICD-10-CM

## 2022-07-02 DIAGNOSIS — N183 Chronic kidney disease, stage 3 unspecified: Secondary | ICD-10-CM

## 2022-07-02 DIAGNOSIS — E785 Hyperlipidemia, unspecified: Secondary | ICD-10-CM

## 2022-07-02 DIAGNOSIS — N179 Acute kidney failure, unspecified: Secondary | ICD-10-CM

## 2022-07-02 DIAGNOSIS — I10 Essential (primary) hypertension: Secondary | ICD-10-CM | POA: Diagnosis present

## 2022-07-02 DIAGNOSIS — K219 Gastro-esophageal reflux disease without esophagitis: Secondary | ICD-10-CM | POA: Diagnosis present

## 2022-07-02 DIAGNOSIS — I700 Atherosclerosis of aorta: Secondary | ICD-10-CM | POA: Diagnosis not present

## 2022-07-02 DIAGNOSIS — I255 Ischemic cardiomyopathy: Secondary | ICD-10-CM | POA: Diagnosis present

## 2022-07-02 DIAGNOSIS — R1900 Intra-abdominal and pelvic swelling, mass and lump, unspecified site: Secondary | ICD-10-CM

## 2022-07-02 DIAGNOSIS — R1013 Epigastric pain: Secondary | ICD-10-CM

## 2022-07-02 HISTORY — DX: Ischemic cardiomyopathy: I25.5

## 2022-07-02 LAB — NM MYOCAR MULTI W/SPECT W/WALL MOTION / EF
Estimated workload: 1
Exercise duration (min): 0 min
Exercise duration (sec): 0 s
LV dias vol: 122 mL (ref 62–150)
LV sys vol: 47 mL
MPHR: 139 {beats}/min
Nuc Stress EF: 61 %
Peak HR: 96 {beats}/min
Percent HR: 69 %
Rest HR: 63 {beats}/min
Rest Nuclear Isotope Dose: 10.9 mCi
SDS: 3
SRS: 0
SSS: 1
ST Depression (mm): 0 mm
Stress Nuclear Isotope Dose: 30.9 mCi
TID: 1.09

## 2022-07-02 LAB — CBG MONITORING, ED
Glucose-Capillary: 112 mg/dL — ABNORMAL HIGH (ref 70–99)
Glucose-Capillary: 123 mg/dL — ABNORMAL HIGH (ref 70–99)
Glucose-Capillary: 123 mg/dL — ABNORMAL HIGH (ref 70–99)

## 2022-07-02 LAB — HEPATIC FUNCTION PANEL
ALT: 14 U/L (ref 0–44)
AST: 18 U/L (ref 15–41)
Albumin: 4.1 g/dL (ref 3.5–5.0)
Alkaline Phosphatase: 26 U/L — ABNORMAL LOW (ref 38–126)
Bilirubin, Direct: <0.1 mg/dL (ref 0.0–0.2)
Total Bilirubin: 0.3 mg/dL (ref 0.3–1.2)
Total Protein: 7 g/dL (ref 6.5–8.1)

## 2022-07-02 LAB — PHOSPHORUS: Phosphorus: 3.4 mg/dL (ref 2.5–4.6)

## 2022-07-02 LAB — APTT: aPTT: 26 seconds (ref 24–36)

## 2022-07-02 LAB — PROTIME-INR
INR: 1.1 (ref 0.8–1.2)
Prothrombin Time: 14.5 seconds (ref 11.4–15.2)

## 2022-07-02 LAB — LIPASE, BLOOD: Lipase: 30 U/L (ref 11–51)

## 2022-07-02 LAB — HEMOGLOBIN A1C
Hgb A1c MFr Bld: 6.3 % — ABNORMAL HIGH (ref 4.8–5.6)
Mean Plasma Glucose: 134.11 mg/dL

## 2022-07-02 LAB — HEPARIN LEVEL (UNFRACTIONATED): Heparin Unfractionated: 0.59 IU/mL (ref 0.30–0.70)

## 2022-07-02 LAB — TSH: TSH: 3.158 u[IU]/mL (ref 0.350–4.500)

## 2022-07-02 LAB — MAGNESIUM: Magnesium: 2 mg/dL (ref 1.7–2.4)

## 2022-07-02 LAB — TROPONIN I (HIGH SENSITIVITY): Troponin I (High Sensitivity): 51 ng/L — ABNORMAL HIGH (ref <18)

## 2022-07-02 MED ORDER — ALPRAZOLAM 0.5 MG PO TABS: 0.2500 mg | ORAL_TABLET | Freq: Two times a day (BID) | ORAL | Status: DC | PRN

## 2022-07-02 MED ORDER — HEPARIN BOLUS VIA INFUSION
4000.0000 [IU] | Freq: Once | INTRAVENOUS | Status: AC
  Administered 2022-07-02: 4000 [IU] via INTRAVENOUS
  Filled 2022-07-02: qty 4000

## 2022-07-02 MED ORDER — ASPIRIN 81 MG PO TBEC
81.0000 mg | DELAYED_RELEASE_TABLET | Freq: Every day | ORAL | Status: DC
  Administered 2022-07-03: 81 mg via ORAL
  Filled 2022-07-02: qty 1

## 2022-07-02 MED ORDER — HYDROMORPHONE HCL 1 MG/ML IJ SOLN: 0.5000 mg | INTRAMUSCULAR | Status: DC | PRN

## 2022-07-02 MED ORDER — ASPIRIN 81 MG PO CHEW
324.0000 mg | CHEWABLE_TABLET | Freq: Once | ORAL | Status: AC
  Administered 2022-07-02: 324 mg via ORAL
  Filled 2022-07-02: qty 4

## 2022-07-02 MED ORDER — ASPIRIN 81 MG PO CHEW: 324.0000 mg | CHEWABLE_TABLET | ORAL | Status: DC

## 2022-07-02 MED ORDER — ACETAMINOPHEN 325 MG PO TABS: 650.0000 mg | ORAL_TABLET | ORAL | Status: DC | PRN

## 2022-07-02 MED ORDER — NITROGLYCERIN 2 % TD OINT: 1.0000 [in_us] | TOPICAL_OINTMENT | Freq: Once | TRANSDERMAL | Status: DC

## 2022-07-02 MED ORDER — SODIUM CHLORIDE 0.9 % IV SOLN: INTRAVENOUS | Status: DC

## 2022-07-02 MED ORDER — TECHNETIUM TC 99M TETROFOSMIN IV KIT
10.8900 | PACK | Freq: Once | INTRAVENOUS | Status: AC | PRN
  Administered 2022-07-02: 10.89 via INTRAVENOUS

## 2022-07-02 MED ORDER — REGADENOSON 0.4 MG/5ML IV SOLN
0.4000 mg | Freq: Once | INTRAVENOUS | Status: AC
  Administered 2022-07-02: 0.4 mg via INTRAVENOUS

## 2022-07-02 MED ORDER — TRAZODONE HCL 50 MG PO TABS: 25.0000 mg | ORAL_TABLET | Freq: Every evening | ORAL | Status: DC | PRN

## 2022-07-02 MED ORDER — TECHNETIUM TC 99M TETROFOSMIN IV KIT
30.9000 | PACK | Freq: Once | INTRAVENOUS | Status: AC | PRN
  Administered 2022-07-02: 30.9 via INTRAVENOUS

## 2022-07-02 MED ORDER — ONDANSETRON HCL 4 MG/2ML IJ SOLN: 4.0000 mg | Freq: Four times a day (QID) | INTRAMUSCULAR | Status: DC | PRN

## 2022-07-02 MED ORDER — NITROGLYCERIN 0.4 MG SL SUBL
0.4000 mg | SUBLINGUAL_TABLET | SUBLINGUAL | Status: DC | PRN
  Administered 2022-07-02 (×3): 0.4 mg via SUBLINGUAL
  Filled 2022-07-02 (×3): qty 1

## 2022-07-02 MED ORDER — INSULIN ASPART 100 UNIT/ML IJ SOLN
0.0000 [IU] | Freq: Three times a day (TID) | INTRAMUSCULAR | Status: DC
  Filled 2022-07-02: qty 1

## 2022-07-02 MED ORDER — PANTOPRAZOLE SODIUM 40 MG PO TBEC
40.0000 mg | DELAYED_RELEASE_TABLET | Freq: Every day | ORAL | Status: DC
  Administered 2022-07-02 – 2022-07-03 (×2): 40 mg via ORAL
  Filled 2022-07-02 (×2): qty 1

## 2022-07-02 MED ORDER — INSULIN ASPART 100 UNIT/ML IJ SOLN: 0.0000 [IU] | Freq: Every day | INTRAMUSCULAR | Status: DC

## 2022-07-02 MED ORDER — MAGNESIUM HYDROXIDE 400 MG/5ML PO SUSP: 30.0000 mL | Freq: Every day | ORAL | Status: DC | PRN

## 2022-07-02 MED ORDER — ALPRAZOLAM 0.25 MG PO TABS: 0.2500 mg | ORAL_TABLET | Freq: Three times a day (TID) | ORAL | Status: DC | PRN

## 2022-07-02 MED ORDER — PRAVASTATIN SODIUM 20 MG PO TABS
40.0000 mg | ORAL_TABLET | ORAL | Status: DC
  Administered 2022-07-03: 40 mg via ORAL
  Filled 2022-07-02: qty 2

## 2022-07-02 MED ORDER — VITAMIN D 25 MCG (1000 UNIT) PO TABS
5000.0000 [IU] | ORAL_TABLET | ORAL | Status: DC
  Filled 2022-07-02: qty 5

## 2022-07-02 MED ORDER — HEPARIN (PORCINE) 25000 UT/250ML-% IV SOLN
1350.0000 [IU]/h | INTRAVENOUS | Status: DC
  Administered 2022-07-02 (×2): 1350 [IU]/h via INTRAVENOUS
  Filled 2022-07-02 (×3): qty 250

## 2022-07-02 MED ORDER — NITROGLYCERIN 0.4 MG SL SUBL: 0.4000 mg | SUBLINGUAL_TABLET | SUBLINGUAL | Status: DC | PRN

## 2022-07-02 MED ORDER — ASPIRIN 300 MG RE SUPP: 300.0000 mg | RECTAL | Status: DC

## 2022-07-02 MED ORDER — ASPIRIN 81 MG PO TBEC: 81.0000 mg | DELAYED_RELEASE_TABLET | Freq: Every day | ORAL | Status: DC

## 2022-07-02 MED ORDER — VITAMIN B-12 1000 MCG PO TABS
1000.0000 ug | ORAL_TABLET | Freq: Every day | ORAL | Status: DC
  Administered 2022-07-02 – 2022-07-03 (×2): 1000 ug via ORAL
  Filled 2022-07-02 (×2): qty 1

## 2022-07-02 MED ORDER — RAMIPRIL 5 MG PO CAPS
5.0000 mg | ORAL_CAPSULE | Freq: Two times a day (BID) | ORAL | Status: DC
  Administered 2022-07-02 – 2022-07-03 (×3): 5 mg via ORAL
  Filled 2022-07-02 (×3): qty 1

## 2022-07-02 MED ORDER — PANTOPRAZOLE SODIUM 40 MG PO TBEC: 40.0000 mg | DELAYED_RELEASE_TABLET | Freq: Every day | ORAL | Status: DC | PRN

## 2022-07-02 NOTE — Assessment & Plan Note (Signed)
-   Patient with prior history of peptic ulcer; was not taking PPIs at time of admission -Started on Protonix for GERD.

## 2022-07-02 NOTE — Assessment & Plan Note (Signed)
-   No acute ischemic changes appreciated on EKG -Patient with typical/atypical features; resolution of pain with the use of nitroglycerin and significant risk factors (heart score 5). -Patient's troponin trending up. -High concerns for angina/ACS. -Cardiology service consulted -Patient has been started on heparin drip -N.p.o. status except for meds. -Holding on repeating echo, patient had echocardiogram in July 2023.  Cardiology to determine if he will benefit of at least limited ultrasound to follow contractility and wall motion. -Started on aspirin, continue ramipril, statin and as needed nitroglycerin. -Holding on beta-blockers given presence of bradycardia.

## 2022-07-02 NOTE — Assessment & Plan Note (Addendum)
-  Update A1c -Patient was just following diet prior to admission -Started sliding scale insulin while inpatient. -Follow CBGs repletion.

## 2022-07-02 NOTE — Assessment & Plan Note (Signed)
-  Body mass index is 30.81 kg/m. -Low-calorie diet, portion control and increase physical activity discussed with patient.

## 2022-07-02 NOTE — ED Notes (Signed)
Report given to Ashley, RN

## 2022-07-02 NOTE — Consult Note (Signed)
Cardiology Consult    Patient ID: Luis Sweeney MRN: [id number], DOB/AGE: April 07, 1941   Admit date: 07/02/2022 Date of Consult: 07/02/2022  Primary Physician: Ria Bush, MD Primary Cardiologist: Sherren Mocha, MD Requesting Provider: Reino Bellis, MD  Patient Profile    Luis Sweeney is a 81 y.o. male with a history of CAD s/p prior LAD stenting, HTN, HL (mult statin intolerances), CKD III, ICM (EF 45-50% 04/2021), and anemia , who is being seen today for the evaluation of elevated troponin at the request of Dr. Dyann Kief.  Past Medical History   Past Medical History:  Diagnosis Date   Acute duodenal ulcer with hemorrhage but without obstruction    required  bld. transfusion   Anemia, iron deficiency    Arthritis    OA- pt. reports its. minor   CAD (coronary artery disease)    a. a. 2004 PCI/DES LAD; b. 06/2016 Cath: LM nl, LAD patent stent, D1 jailed, 90p, LCX nl, OM1 small, nl, RCA nl->Med Rx; c. 04/2021 MV: EF 46%, apical inf infarct. No ischemia.   CKD (chronic kidney disease), stage III ([place])    COVID-19 virus infection 10/2020   Diverticulosis of colon    Duodenal stricture - recurrent 09/21/2014   Erb's palsy    birth trauma, pt. reports he was 11lbs. + at birth    GERD (gastroesophageal reflux disease)    H/O hiatal hernia    Helicobacter pylori gastritis    recurrent   Hypercholesteremia    Hyperglycemia    Hypertension    Ischemic cardiomyopathy    a. 04/2022 Echo: EF 45-50%, mild antlat HK. Nl RV fxn.   Myocardial infarct (HCC)    NAFLD (nonalcoholic fatty liver disease) 2015   with liver cysts   Paralysis of upper limb (HCC)    R arm- birth injury   Personal history of colonic polyps    Small bowel obstruction (HCC)    Urticaria    Lat left arm    Past Surgical History:  Procedure Laterality Date   ANTERIOR CERVICAL DECOMP/DISCECTOMY FUSION  03/2013   C3/4, C4/5 HNP (kritzer)   ANTERIOR CERVICAL DECOMP/DISCECTOMY FUSION N/A 03/15/2013   Procedure:  ANTERIOR CERVICAL DECOMPRESSION/DISCECTOMY FUSION 2 LEVELS;  Surgeon: Faythe Ghee, MD;  Location: [facility];  Service: Neurosurgery;  Laterality: N/A;  Cervical three-four, cervical four-five anterior cervical diskectomy fusion with a trabecular metal plus plate    CARDIAC CATHETERIZATION  2006   stent placed   CARDIAC CATHETERIZATION N/A 07/08/2016   Procedure: Left Heart Cath and Coronary Angiography;  Surgeon: Sherren Mocha, MD;  Location: [facility];  Service: Cardiovascular;  Laterality: N/A;   CATARACT EXTRACTION W/PHACO Right 11/04/2021   Procedure: CATARACT EXTRACTION PHACO AND INTRAOCULAR LENS PLACEMENT ([place]) RIGHT VIVITY LENS 7.38 00:56.1;  Surgeon: Birder Robson, MD;  Location: [facility];  Service: Ophthalmology;  Laterality: Right;   CATARACT EXTRACTION W/PHACO Left 11/18/2021   Procedure: CATARACT EXTRACTION PHACO AND INTRAOCULAR LENS PLACEMENT ([facility]) LEFT VIVITY LENS 7.96 00:47.1;  Surgeon: Birder Robson, MD;  Location: [facility];  Service: Ophthalmology;  Laterality: Left;  wants late arrival   COLONOSCOPY  multiple   diverticulosis, int hem, h/o adenomatous polyps [name])   COLONOSCOPY  07/2014   severe diverticulosis, no rpt needed [name])   CORONARY STENT PLACEMENT  2006   drug-eluting   ESOPHAGOGASTRODUODENOSCOPY  11/26/10   mild gastritis, duodenitis, duodenal stricture (Dr. [name])   ESOPHAGOGASTRODUODENOSCOPY  10//2015   duodenal stricture, ?recurrent ulcer (  [name])   HERNIA REPAIR Right 2006   Dr. Hassell Done   KNEE SURGERY     left   SHOULDER SURGERY     right   spinal cyst removal     WRIST SURGERY     XI ROBOTIC ASSISTED INGUINAL HERNIA REPAIR WITH MESH Right 05/29/2021   Procedure: XI ROBOTIC ASSISTED INGUINAL HERNIA REPAIR WITH MESH;  Surgeon: Herbert Pun, MD;  Location: [facility];  Service: General;  Laterality: Right;     Allergies  Allergies  Allergen Reactions   Prednisone Other (See Comments)    Feels  really bad   Codeine Hives and Itching   Lipitor [Atorvastatin] Other (See Comments)    Reaction:  Muscle pain    Morphine Hives and Itching   Simvastatin Other (See Comments)    Reaction:  Muscle pain     History of Present Illness    Luis Sweeney is a 81 y.o. male with a history of CAD s/p prior LAD stenting, HTN, HL (mult statin intolerances), CKD III, ICM (EF 45-50% 04/2021), and anemia who presented to emergency department following intermittent episodes of epigastric pain that radiates to the right side of the chest and back.  His cardiac history is significant for DES to the LAD in 2004, low risk Myoview in 2010, and cardiac catheterization in 06/2016 that demonstrated a patent prox LAD stent w/ a jailed diag branch.  He was last seen in clinic in 03/2021 and subsequently underwent stress testing, which showed an EF of 46% w/ prior apical infarct, but no ischemia.  F/u Echocardiogram 04/2021 suggested EF 45-50%, mild LVH, Gr 1 DD, inf and apical HK, dilated ascending aorta (54m).   He was medically managed.  Since then, he has done well.  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    He was in his usoh until Monday 9/18, when he began to experience intermittent, though relatively constant, dull pain begins in the epigastrum and radiates to the right side of his chest and upper abdomen.  At times, the pain completely subsides but he thinks that on aggregate, pain has been present 75% of the time since 9/18.  Discomfort does not seem to worsen w/ eating, drinking, or upper body movements.  He denies tenderness.  Patient describes this pain as different from what he experienced in 2004 during his MI, which was more typical angina  Substernal w /radiation down the left arm.  He denies nausea, dyspnea, palpitations, dizziness, or lightheadedness. UKoreaaorta in ED suggests aortic atherosclerosis  with 2.7 cm AAA.  RUQ u/s neg for acute findings.  HsTrops mildly elevated @ 21  39 51.    Inpatient Medications     aspirin  324 mg Oral NOW   Or   aspirin  300 mg Rectal NOW   [START ON 07/03/2022] aspirin EC  81 mg Oral Daily   cholecalciferol  5,000 Units Oral Weekly   cyanocobalamin  1,000 mcg Oral Daily   insulin aspart  0-5 Units Subcutaneous QHS   insulin aspart  0-9 Units Subcutaneous TID WC   pantoprazole  40 mg Oral Daily   [START ON 07/03/2022] pravastatin  40 mg Oral Once per day on Mon Wed Fri   ramipril  5 mg Oral BID    Family History    Family History  Problem Relation Age of Onset   Diabetes  Father    Heart disease Father        CHF   Kidney disease Father        kidney failure   Heart failure Father    Diabetes Sister    Hypertension Sister    Cancer Sister        pelvic mass   Hypertension Sister    Arthritis Sister    Ovarian cancer Other    He indicated that his mother is deceased. He indicated that his father is deceased. He indicated that all of his four sisters are alive. He indicated that only one of his two others is alive.   Social History    Social History   Socioeconomic History   Marital status: Married    Spouse name: Not on file   Number of children: 3   Years of education: Not on file   Highest education level: Not on file  Occupational History   Occupation: HT Warden/ranger: [employer]    Comment: metal Fabrication/Stairs/Rails commerial  Tobacco Use   Smoking status: Former    Packs/day: 1.50    Years: 3.00    Total pack years: 4.50    Types: Cigarettes    Quit date: 02/16/1985    Years since quitting: 37.3   Smokeless tobacco: Never  Vaping Use   Vaping Use: Never used  Substance and Sexual Activity   Alcohol use: Yes    Alcohol/week: 0.0 standard drinks of alcohol    Comment: two to three times a year   Drug use: No   Sexual activity: Never    Comment: last few years.  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   Social Determinants of Health   Financial Resource Strain: [place]  (03/23/2022)   Overall Financial Resource Strain (CARDIA)    Difficulty of Paying Living Expenses: Not hard at all  Food Insecurity: No Food Insecurity (03/23/2022)   Hunger Vital Sign    Worried About Running Out of Food in the Last Year: Never true    [place] in the Last Year: Never true  Transportation Needs: No Transportation Needs (03/23/2022)   PRAPARE - Hydrologist (Medical): No    Lack of Transportation (Non-Medical): No  Physical Activity: Insufficiently Active (03/23/2022)   Exercise Vital Sign    Days of Exercise per Week: 3 days    Minutes of Exercise per Session: 30 min  Stress: No Stress Concern Present (03/23/2022)   [place]    Feeling of Stress : Only a little  Social Connections: Moderately Integrated (03/23/2022)   Social Connection and Isolation Panel [NHANES]    Frequency of Communication with Friends and Family: More than three times a week    Frequency of Social Gatherings with Friends and Family: Once a week    Attends Religious Services: More than 4 times per year    Active Member of [employer] or Organizations: No    Attends Archivist Meetings: Never    Marital Status: Married  Human resources officer Violence: Not At  Risk (03/23/2022)   Humiliation, Afraid, Rape, and Kick questionnaire    Fear of Current or Ex-Partner: No    Emotionally Abused: No    Physically Abused: No    Sexually Abused: No     Review of Systems    General:  No chills, fever, night sweats or weight changes.   Cardiovascular:  +++ chest/epigastric pain, +++ chronic dyspnea on exertion, no edema, orthopnea, palpitations, paroxysmal nocturnal dyspnea. Dermatological: No rash, lesions/masses Respiratory: No cough, +++ chronic dyspnea on exertion. Urologic: No hematuria, dysuria Abdominal:   Patient has intermittent epigastric to right upper abdominal pain. No nausea, vomiting, diarrhea, bright red blood per rectum, melena, or hematemesis Neurologic:  No visual changes, wkns, changes in mental status. All other systems reviewed and are otherwise negative except as noted above.  Physical Exam    Blood pressure (!) 122/43, pulse (!) 54, temperature 98.2 F (36.8 C), temperature source Oral, resp. rate 12, height '6\' 2"'$  (1.88 m), weight 108.9 kg, SpO2 92 %.  General: Pleasant, NAD Psych: Normal affect. Neuro: Alert and oriented X 3. Moves all extremities spontaneously. HEENT: Normal  Neck: Supple without bruits or JVD. Lungs:  Resp regular and unlabored, CTA. Heart: RRR no s3, s4, or murmurs. Abdomen:  RUQ tenderness to light palpation.  Soft, non-distended, BS + x 4.  Extremities: No clubbing, cyanosis or edema. DP/PT2+, Radials 2+ and equal bilaterally.  Labs    Cardiac Enzymes Recent Labs  Lab 07/01/22 1856 07/01/22 2242 07/02/22 0247  TROPONINIHS 21* 39* 51*      Lab Results  Component Value Date   WBC 10.5 07/01/2022   HGB 13.6 07/01/2022   HCT 41.6 07/01/2022   MCV 94.8 07/01/2022   PLT 230 07/01/2022    Recent Labs  Lab 07/01/22 1856 07/01/22 2242  NA 137  --   K 4.7  --   CL 107  --   CO2 23  --   BUN 35*  --   CREATININE 1.79*  --   CALCIUM 9.1  --   PROT  --  7.0  BILITOT  --  0.3  ALKPHOS  --  26*  ALT  --  14  AST  --  18  GLUCOSE 123*  --    Lab Results  Component Value Date   CHOL 115 01/22/2020   HDL 37.50 (L) 01/22/2020   LDLCALC 70 01/22/2020   TRIG 35.0 01/22/2020    Radiology Studies    US ABDOMEN LIMITED RUQ (LIVER/GB)  Result Date:  07/02/2022 CLINICAL DATA:  Epigastric and right upper quadrant pain with radiation through to the back. EXAM: ULTRASOUND ABDOMEN LIMITED RIGHT UPPER QUADRANT COMPARISON:  CT without contrast 05/29/2021. FINDINGS: Gallbladder: No gallstones or wall thickening visualized. No sonographic Murphy sign noted by sonographer. Common bile duct: Diameter: 2.4 mm with no intrahepatic biliary prominence. Liver: Septated cyst again noted in the left lobe lateral segment measuring 1.6 cm, unchanged. Thin walled simple cyst in the peripheral right lobe measuring 4.1 cm, previously 4.5 cm. Thin walled simple cyst deeper in the right lobe measures 1 cm, previously 1 cm. Otherwise within normal limits in parenchymal echogenicity. Portal vein is patent on color Doppler imaging with normal direction of blood flow towards the liver. Other: No free fluid is seen. IMPRESSION: 1. Unremarkable gallbladder wall and lumen. 2. Hepatic cysts. 3. No acute sonographic findings. Electronically Signed   By: Telford Nab M.D.   On: 07/02/2022 04:02   US Aorta  Result Date: 07/02/2022  CLINICAL DATA:  Epigastric and right upper quadrant pain radiating to the back. EXAM: ULTRASOUND OF ABDOMINAL AORTA TECHNIQUE: Ultrasound examination of the abdominal aorta and proximal common iliac arteries was performed to evaluate for aneurysm. Additional color and Doppler images of the distal aorta were obtained to document patency. COMPARISON:  CT without contrast 05/29/2021. FINDINGS: There is mild-to-moderate echogenic calcific aortoiliac wall plaque, better seen on CT. Abdominal aortic measurements as follows (AP and transverse axes, respectively): Proximal:  2.7 x 2.4 cm Mid:  2.4 x 2.2 cm Distal:  1.9 x 1.7 cm Patent: Yes, peak systolic velocity is 381.8 cm/s Right common iliac artery: 1.2 x 1.7 cm Left common iliac artery: 1.3 x 1.3 cm IMPRESSION: 1. Aortic atherosclerosis with 2.7 cm AAA. Recommend follow-up every 5 years. Reference: J Am Coll Radiol  2993;[id number]. 2. No grayscale evident flow-limiting stenosis or dissection. Limited image detail due to habitus. 3. Ectasia in the common iliac arteries, more so on the right. Electronically Signed   By: Telford Nab M.D.   On: 07/02/2022 03:56   DG Chest 2 View  Result Date: 07/01/2022 CLINICAL DATA:  Chest pain. EXAM: CHEST - 2 VIEW COMPARISON:  Chest radiograph dated 11/21/2020. FINDINGS: No focal consolidation, pleural effusion, or pneumothorax. The cardiac silhouette is within normal limits. Degenerative changes of the spine. No acute osseous pathology. IMPRESSION: No active cardiopulmonary disease. Electronically Signed   By: Anner Crete M.D.   On: 07/01/2022 19:10    ECG & Cardiac Imaging    RSR, 89, prior anteroseptal infarct - personally reviewed.  Assessment & Plan    1. Epigastric/Abdominal pain:  Patient presenting with epigastric pain that radiates to right upper quadrant and chest.  Pain with palpation to right upper abdomen with some guarding.  US abdomen RUQ unremarkable.  Consider additional abdominal imaging to rule out other causes of abd pain.  Despite mild HsTrop elevation, presentation inconsistent w/ ACS.  2. Coronary artery disease/elevated troponin: As above, pt w/ a now 4 day h/o almost constan epigastric and RUQ pain and mild tenderness/guarding on exam.  In that setting, HsTrop mildly elevated w/ relatively flat trend of 21  39  51.  He notes that previous angina was more typical in nature.  Will risk stratify w/ lexiscan myoview today.  If high risk features present, will consider cath.  Continue aspirin, hepainr, and statin.   3. Essential hypertension: Blood pressure stable on current regimen.  Continue ramipril.   4. Hyperlipidemia: LDL 70 01/2020. Continue statin rx (only tolerates low potency - lovastatin @ home).  F/u in outpt setting.  5. Stage 3b chronic kidney disease: creatinine stable @  1.79. Cont acei for now.  If he needs cath, will have to  hold.  6. ICM:  Ef 45-50% by echo last year.  Euvolemic.  Cont acei.  Not on ? blocker previously - HR 65 @ visit last year.  Will need to consider at least low dose metop succinate or carvedilol.  Risk Assessment/Risk Scores:     TIMI Risk Score for Unstable Angina or Non-ST Elevation MI:   The patient's TIMI risk score is 5, which indicates a 26% risk of all cause mortality, new or recurrent myocardial infarction or need for urgent revascularization in the next 14 days.      Signed, Murray Hodgkins, NP 07/02/2022, 11:02 AM  For questions or updates, please contact   Please consult [email or web address] for contact info under Cardiology/STEMI.

## 2022-07-02 NOTE — ED Notes (Signed)
US at bedside

## 2022-07-02 NOTE — Assessment & Plan Note (Signed)
-   Stable currently -Continue current antihypertensive agents -Follow-up vital signs.

## 2022-07-02 NOTE — Progress Notes (Signed)
[place] for heparin infusion Indication: unstable angina vs nSTEMI  Allergies  Allergen Reactions   Prednisone Other (See Comments)    Feels really bad   Codeine Hives and Itching   Lipitor [Atorvastatin] Other (See Comments)    Reaction:  Muscle pain    Morphine Hives and Itching   Simvastatin Other (See Comments)    Reaction:  Muscle pain     Patient Measurements: Height: '6\' 2"'$  (188 cm) Weight: 108.9 kg (240 lb) IBW/kg (Calculated) : 82.2 Heparin Dosing Weight: 104.6 kg  Vital Signs: Temp: 97.8 F (36.6 C) (09/21 0323) Temp Source: Oral (09/21 0323) BP: 136/72 (09/21 0340) Pulse Rate: 78 (09/21 0340)  Labs: Recent Labs    07/01/22 1856 07/01/22 2242 07/02/22 0247  HGB 13.6  --   --   HCT 41.6  --   --   PLT 230  --   --   CREATININE 1.79*  --   --   TROPONINIHS 21* 39* 51*    Estimated Creatinine Clearance: 42.5 mL/min (A) (by C-G formula based on SCr of 1.79 mg/dL (H)).   Medical History: Past Medical History:  Diagnosis Date   Acute duodenal ulcer with hemorrhage but without obstruction    required  bld. transfusion   Anemia, iron deficiency    Arthritis    OA- pt. reports its. minor   CAD (coronary artery disease) 2004   a. s/p MI treated with drug-eluting stent placement to the proximal left anterior descending in late 2004 // Echocardiogram 7/22: EF 45-50, mild LVH, Gr 1 DD, inf and apical HK, normal RVSF, trivial MR, dilated ascending aorta (38 mm)   CKD (chronic kidney disease), stage III ([place])    COVID-19 virus infection 10/2020   Diverticulosis of colon    Duodenal stricture - recurrent 09/21/2014   Erb's palsy    birth trauma, pt. reports he was 11lbs. + at birth    GERD (gastroesophageal reflux disease)    H/O hiatal hernia    Helicobacter pylori gastritis    recurrent   Hypercholesteremia    Hyperglycemia    Hypertension    Myocardial infarct (HCC)    NAFLD (nonalcoholic fatty liver disease)  2015   with liver cysts   Paralysis of upper limb (HCC)    R arm- birth injury   Personal history of colonic polyps    Small bowel obstruction (HCC)    Urticaria    Lat left arm    Assessment: Pt is a 81 yo male presenting to ED w/ chest discomfort being evaluated for unstable angina w/ slightly elevation of Troponin I lvl, trending up.  Goal of Therapy:  Heparin level 0.3-0.7 units/ml Monitor platelets by anticoagulation protocol: Yes   Plan:  Bolus 4000 units x 1 Start heparin infusion at 1350 units/hr Will check HL in 8 hr after start of infusion CBC daily while on heparin  Renda Rolls, PharmD, [facility] 07/02/2022 4:38 AM

## 2022-07-02 NOTE — ED Notes (Signed)
Pt taken off unit for stress test.

## 2022-07-02 NOTE — Assessment & Plan Note (Signed)
-   Started on PPI. -Lifestyle changes discussed with patient.

## 2022-07-02 NOTE — Assessment & Plan Note (Signed)
-  2.7 cm aneurysm appreciated -Following radiology recommendations patient will benefit of every 5 years ultrasound surveillance. -continue risk factor modification.

## 2022-07-02 NOTE — Assessment & Plan Note (Signed)
-  Continue as needed Xanax -Overall stable mood.

## 2022-07-02 NOTE — H&P (Signed)
History and Physical    Patient: Luis Sweeney LFY:[id number] DOB: May 16, 1941 DOA: 07/02/2022 DOS: the patient was seen and examined on 07/02/2022 PCP: Ria Bush, MD  Patient coming from: Home  Chief Complaint:  Chief Complaint  Patient presents with   Chest Pain   HPI: Luis Sweeney is a 81 y.o. male with medical history significant of prior history of coronary artery disease, ischemic cardiomyopathy/systolic heart failure recent ejection fraction 45 to 50% (echo July 2023), gastroesophageal reflux disease with history of peptic ulcer, hypertension, hyperlipidemia, class I obesity, chronic kidney disease stage IIIb, anxiety and B12 deficiency; who presented to the hospital secondary to chest discomfort.  Patient reports symptoms For approximately 6-7 hours prior to admission, localizing his lower chest upper midepigastric region and radiated to right upper quadrant and to his back.  Patient reports no shortness of breath, palpitations, left jaw or left arm pain associated with his discomfort.  Expressed no changes in his pain by eating, resting or performing activities.  Patient reported some intermittent nausea but no vomiting.  Patient denies sick contacts, fever, coughing spells, dysuria, hematuria, melena, hematochezia, fever/chills or any other complaints.  In the ED work-up demonstrating trending up troponin, improvement in his symptoms with the use of nitroglycerin, EKG without acute ischemic changes, negative lipase, negative right upper quadrant ultrasound and no acute cardiopulmonary process on chest x-ray.  Given improvement in his symptoms with nitroglycerin, trending up troponin and significant risk factors, patient started on heparin drip and [facility]/cardiology has been consulted for further evaluation and management.  Review of Systems: As mentioned in the history of present illness. All other systems reviewed and are negative.  Past Medical History:  Diagnosis Date    Acute duodenal ulcer with hemorrhage but without obstruction    required  bld. transfusion   Anemia, iron deficiency    Arthritis    OA- pt. reports its. minor   CAD (coronary artery disease) 2004   a. s/p MI treated with drug-eluting stent placement to the proximal left anterior descending in late 2004 // Echocardiogram 7/22: EF 45-50, mild LVH, Gr 1 DD, inf and apical HK, normal RVSF, trivial MR, dilated ascending aorta (38 mm)   CKD (chronic kidney disease), stage III ([place])    COVID-19 virus infection 10/2020   Diverticulosis of colon    Duodenal stricture - recurrent 09/21/2014   Erb's palsy    birth trauma, pt. reports he was 11lbs. + at birth    GERD (gastroesophageal reflux disease)    H/O hiatal hernia    Helicobacter pylori gastritis    recurrent   Hypercholesteremia    Hyperglycemia    Hypertension    Myocardial infarct ([place])    NAFLD (nonalcoholic fatty liver disease) 2015   with liver cysts   Paralysis of upper limb (HCC)    R arm- birth injury   Personal history of colonic polyps    Small bowel obstruction ([place])    Urticaria    Lat left arm   Past Surgical History:  Procedure Laterality Date   ANTERIOR CERVICAL DECOMP/DISCECTOMY FUSION  03/2013   C3/4, C4/5 HNP (kritzer)   ANTERIOR CERVICAL DECOMP/DISCECTOMY FUSION N/A 03/15/2013   Procedure: ANTERIOR CERVICAL DECOMPRESSION/DISCECTOMY FUSION 2 LEVELS;  Surgeon: Faythe Ghee, MD;  Location: [facility];  Service: Neurosurgery;  Laterality: N/A;  Cervical three-four, cervical four-five anterior cervical diskectomy fusion with a trabecular metal plus plate    CARDIAC CATHETERIZATION  2006   stent placed   CARDIAC  CATHETERIZATION N/A 07/08/2016   Procedure: Left Heart Cath and Coronary Angiography;  Surgeon: Sherren Mocha, MD;  Location: [facility];  Service: Cardiovascular;  Laterality: N/A;   CATARACT EXTRACTION W/PHACO Right 11/04/2021   Procedure: CATARACT EXTRACTION PHACO AND INTRAOCULAR LENS PLACEMENT  ([place]) RIGHT VIVITY LENS 7.38 00:56.1;  Surgeon: Birder Robson, MD;  Location: [place];  Service: Ophthalmology;  Laterality: Right;   CATARACT EXTRACTION W/PHACO Left 11/18/2021   Procedure: CATARACT EXTRACTION PHACO AND INTRAOCULAR LENS PLACEMENT ([facility]) LEFT VIVITY LENS 7.96 00:47.1;  Surgeon: Birder Robson, MD;  Location: [facility];  Service: Ophthalmology;  Laterality: Left;  wants late arrival   COLONOSCOPY  multiple   diverticulosis, int hem, h/o adenomatous polyps [name])   COLONOSCOPY  07/2014   severe diverticulosis, no rpt needed [name])   CORONARY STENT PLACEMENT  2006   drug-eluting   ESOPHAGOGASTRODUODENOSCOPY  11/26/10   mild gastritis, duodenitis, duodenal stricture (Dr. [name])   ESOPHAGOGASTRODUODENOSCOPY  10//2015   duodenal stricture, ?recurrent ulcer [name])   HERNIA REPAIR Right 2006   Dr. Hassell Done   KNEE SURGERY     left   SHOULDER SURGERY     right   spinal cyst removal     WRIST SURGERY     XI ROBOTIC ASSISTED INGUINAL HERNIA REPAIR WITH MESH Right 05/29/2021   Procedure: XI ROBOTIC ASSISTED INGUINAL HERNIA REPAIR WITH MESH;  Surgeon: Herbert Pun, MD;  Location: [facility];  Service: General;  Laterality: Right;   Social History:  reports that he quit smoking about 37 years ago. His smoking use included cigarettes. He has a 4.50 pack-year smoking history. He has never used smokeless tobacco. He reports current alcohol use. He reports that he does not use drugs.  Allergies  Allergen Reactions   Prednisone Other (See Comments)    Feels really bad   Codeine Hives and Itching   Lipitor [Atorvastatin] Other (See Comments)    Reaction:  Muscle pain    Morphine Hives and Itching   Simvastatin Other (See Comments)    Reaction:  Muscle pain     Family History  Problem Relation Age of Onset   Diabetes Father    Heart disease Father        CHF   Kidney disease Father        kidney failure   Heart failure Father     Diabetes Sister    Hypertension Sister    Cancer Sister        pelvic mass   Hypertension Sister    Arthritis Sister    Ovarian cancer Other     Prior to Admission medications   Medication Sig Start Date End Date Taking? Authorizing Provider  aspirin EC 81 MG tablet Take 81 mg by mouth daily.    [provider]  celecoxib (CELEBREX) 200 MG capsule  01/19/22   [provider]  Cholecalciferol (VITAMIN D) 125 MCG (5000 UT) CAPS Take 1 capsule by mouth once a week. 02/07/20   Ria Bush, MD  lovastatin (MEVACOR) 40 MG tablet TAKE 1 TABLET BY MOUTH EVERY MONDAY, WEDNESDAY, AND FRIDAY 05/05/22   Ria Bush, MD  meloxicam (MOBIC) 7.5 MG tablet Take 1 tablet (7.5 mg total) by mouth daily. 11/14/21   Marney Setting, NP  methocarbamol (ROBAXIN) 500 MG tablet  01/17/22   [provider]  pantoprazole (PROTONIX) 40 MG tablet Take 1 tablet (40 mg total) by mouth daily as needed (GERD symptoms). 10/27/21   Ria Bush, MD  ramipril (ALTACE) 5 MG capsule Take 1 capsule (5 mg total) by mouth 2 (two) times daily. 06/10/22   Sherren Mocha, MD  vitamin B-12 (CYANOCOBALAMIN) 1000 MCG tablet Take 1,000 mcg by mouth daily.    [provider]    Physical Exam: Vitals:   07/02/22 0545 07/02/22 0615 07/02/22 0645 07/02/22 0729  BP: (!) 120/59 127/67 (!) 119/47 (!) 134/48  Pulse: 81 79 61 91  Resp: '14 15 13 19  '$ Temp:    98.2 F (36.8 C)  TempSrc:    Oral  SpO2: 95% 95% 94% 97%  Weight:      Height:       General exam: Alert, awake, oriented x 3; currently chest pain-free.  No shortness of breath, afebrile. Respiratory system: Clear to auscultation. Respiratory effort normal.  Good saturation on room air. Cardiovascular system: Sinus bradycardia, no rubs, no gallops, no JVD appreciated on exam. Gastrointestinal system: Abdomen is obese, nondistended, soft and nontender. No organomegaly or masses felt. Normal bowel sounds heard. Central nervous  system: Alert and oriented. No focal neurological deficits. Extremities: No cyanosis or clubbing. Skin: No petechiae. Psychiatry: Judgement and insight appear normal. Mood & affect appropriate.   Data Reviewed: Basic metabolic panel: Sodium 366, potassium 4.7, chloride 107, BUN 35, creatinine 1.79, GFR 38, anion gap 7. CBC: WBCs 10.5, hemoglobin 13.6 and platelet count 230 K High sensitive troponin: 21>>39>>51 Lipase: 30 LFTs: Total protein 7.0, albumin 4.1, AST 18, ALT 14, alkaline phosphatase 26 and direct bilirubin less than 0.1 TSH 3.15 A1c and lipid panel has been ordered and pending.  Assessment and Plan: * Chest pain - No acute ischemic changes appreciated on EKG -Patient with typical/atypical features; resolution of pain with the use of nitroglycerin and significant risk factors (heart score 5). -Patient's troponin trending up. -High concerns for angina/ACS. -Cardiology service consulted -Patient has been started on heparin drip -N.p.o. status except for meds. -Holding on repeating echo, patient had echocardiogram in July 2023.  Cardiology to determine if he will benefit of at least limited ultrasound to follow contractility and wall motion. -Started on aspirin, continue ramipril, statin and as needed nitroglycerin. -Holding on beta-blockers given presence of bradycardia.    Ischemic cardiomyopathy - Recent echo demonstrating EF 45-50% -Continue ACE inhibitors -Follow daily weights, strict intake and output -Heart healthy/low-sodium diet discussed with patient. -Not receiving beta-blocker in the setting of bradycardia. -Follow cardiology service recommendations.  AAA (abdominal aortic aneurysm) (HCC) -2.7 cm aneurysm appreciated -Following radiology recommendations patient will benefit of every 5 years ultrasound surveillance. -continue risk factor modification.  Anxiety -Continue as needed Xanax -Overall stable mood.  Essential hypertension - Stable  currently -Continue current antihypertensive agents -Follow-up vital signs.  Type 2 diabetes with nephropathy (HCC) -Update A1c -Patient was just following diet prior to admission -Started sliding scale insulin while inpatient. -Follow CBGs repletion.  Obesity, Class I, BMI 30-34.9 -Body mass index is 30.81 kg/m. -Low-calorie diet, portion control and increase physical activity discussed with patient.  Hx of peptic ulcer - Patient with prior history of peptic ulcer; was not taking PPIs at time of admission -Started on Protonix for GERD.  B12 deficiency -continue B12 supplementation.  Chronic renal disease, stage 3, moderately decreased glomerular filtration rate between 30-59 mL/min/1.73 square meter (HCC) - Appears to be stable and at baseline -Gentle fluid resuscitation has been initiated (in case patient will need cardiac cath). -Minimize nephrotoxic agents and follow renal function trend.  GERD - Started on PPI. -Lifestyle changes discussed with  patient.  Hyperlipidemia associated with type 2 diabetes mellitus (HCC) - Checking lipid panel -Continue statin      Advance Care Planning:   Code Status: Full Code   Consults: Cardiology service.  Family Communication: No family at bedside.  Severity of Illness: The appropriate patient status for this patient is OBSERVATION. Observation status is judged to be reasonable and necessary in order to provide the required intensity of service to ensure the patient's safety. The patient's presenting symptoms, physical exam findings, and initial radiographic and laboratory data in the context of their medical condition is felt to place them at decreased risk for further clinical deterioration. Furthermore, it is anticipated that the patient will be medically stable for discharge from the hospital within 2 midnights of admission.   Author: Barton Dubois, MD 07/02/2022 7:51 AM  For on call review [email or web address].

## 2022-07-02 NOTE — Assessment & Plan Note (Signed)
-   Recent echo demonstrating EF 45-50% -Continue ACE inhibitors -Follow daily weights, strict intake and output -Heart healthy/low-sodium diet discussed with patient. -Not receiving beta-blocker in the setting of bradycardia. -Follow cardiology service recommendations.

## 2022-07-02 NOTE — Assessment & Plan Note (Signed)
-   Checking lipid panel -Continue statin

## 2022-07-02 NOTE — Assessment & Plan Note (Signed)
-  continue B12 supplementation.

## 2022-07-02 NOTE — Assessment & Plan Note (Signed)
-   Appears to be stable and at baseline -Gentle fluid resuscitation has been initiated (in case patient will need cardiac cath). -Minimize nephrotoxic agents and follow renal function trend.

## 2022-07-02 NOTE — ED Notes (Signed)
Pt 's gurney swapped out to hospital bed. Pt resting comfortably awaiting transport to stress test.

## 2022-07-02 NOTE — ED Notes (Signed)
Pt assisted to restroom. Pt daughter, Jenny Reichmann, remains at bedside

## 2022-07-02 NOTE — Progress Notes (Signed)
[place] for heparin infusion Indication: unstable angina vs nSTEMI  Allergies  Allergen Reactions   Prednisone Other (See Comments)    Feels really bad   Codeine Hives and Itching   Lipitor [Atorvastatin] Other (See Comments)    Reaction:  Muscle pain    Morphine Hives and Itching   Simvastatin Other (See Comments)    Reaction:  Muscle pain     Patient Measurements: Height: '6\' 2"'$  (188 cm) Weight: 108.9 kg (240 lb) IBW/kg (Calculated) : 82.2 Heparin Dosing Weight: 104.6 kg  Vital Signs: Temp: 98.1 F (36.7 C) (09/21 1600) Temp Source: Oral (09/21 1600) BP: 125/59 (09/21 1649) Pulse Rate: 70 (09/21 1649)  Labs: Recent Labs    07/01/22 1856 07/01/22 2242 07/02/22 0247 07/02/22 0450 07/02/22 1910  HGB 13.6  --   --   --   --   HCT 41.6  --   --   --   --   PLT 230  --   --   --   --   APTT  --   --   --  26  --   LABPROT  --   --   --  14.5  --   INR  --   --   --  1.1  --   HEPARINUNFRC  --   --   --   --  0.59  CREATININE 1.79*  --   --   --   --   TROPONINIHS 21* 39* 51*  --   --      Estimated Creatinine Clearance: 42.5 mL/min (A) (by C-G formula based on SCr of 1.79 mg/dL (H)).   Medical History: Past Medical History:  Diagnosis Date   Acute duodenal ulcer with hemorrhage but without obstruction    required  bld. transfusion   Anemia, iron deficiency    Arthritis    OA- pt. reports its. minor   CAD (coronary artery disease)    a. a. 2004 PCI/DES LAD; b. 06/2016 Cath: LM nl, LAD patent stent, D1 jailed, 90p, LCX nl, OM1 small, nl, RCA nl->Med Rx; c. 04/2021 MV: EF 46%, apical inf infarct. No ischemia.   CKD (chronic kidney disease), stage III ([place])    COVID-19 virus infection 10/2020   Diverticulosis of colon    Duodenal stricture - recurrent 09/21/2014   Erb's palsy    birth trauma, pt. reports he was 11lbs. + at birth    GERD (gastroesophageal reflux disease)    H/O hiatal hernia    Helicobacter pylori  gastritis    recurrent   Hypercholesteremia    Hyperglycemia    Hypertension    Ischemic cardiomyopathy    a. 04/2022 Echo: EF 45-50%, mild antlat HK. Nl RV fxn.   Myocardial infarct (HCC)    NAFLD (nonalcoholic fatty liver disease) 2015   with liver cysts   Paralysis of upper limb (HCC)    R arm- birth injury   Personal history of colonic polyps    Small bowel obstruction (HCC)    Urticaria    Lat left arm    Assessment: Pt is a 81 yo male presenting to ED w/ chest discomfort being evaluated for unstable angina w/ slightly elevation of Troponin I lvl, trending up.  Goal of Therapy:  Heparin level 0.3-0.7 units/ml Monitor platelets by anticoagulation protocol: Yes   Plan: heparin level therapeutic Continue heparin infusion at 1350 units/hr Will check heparin level in 8 hours CBC daily while on  heparin  Vallery Sa, PharmD, BCPS 07/02/2022 7:43 PM

## 2022-07-02 NOTE — ED Provider Notes (Addendum)
[facility] Provider Note    Event Date/Time   First MD Initiated Contact with Patient 07/02/22 0115     (approximate)   History   Chest Pain   HPI  CASON LUFFMAN is a 81 y.o. male who presents to the ED by private vehicle for evaluation of discomfort in his chest.  He describes it as being right at the top of his abdomen or lower part of the middle of his chest.  It radiates around the right upper abdomen versus right lower chest and into his back.  Nothing in particular seems to make it better or worse.  He has had nausea but no vomiting and not much of an appetite today.  He said that he has a cardiologist and had an MI about 15 years ago but in general does not have any issues.  He has a follow-up appointment with his cardiologist in a couple of weeks.  He has never been told that he has any issues with his gallbladder.  His history lists prior diagnosis of peptic ulcer but he is not currently reporting significant stomach discomfort.     Physical Exam   Triage Vital Signs: ED Triage Vitals  Enc Vitals Group     BP 07/01/22 1849 (!) 163/107     Pulse Rate 07/01/22 1849 71     Resp 07/01/22 1849 18     Temp 07/01/22 1849 97.8 F (36.6 C)     Temp Source 07/01/22 1849 Oral     SpO2 07/01/22 1849 98 %     Weight 07/01/22 1847 108.9 kg (240 lb)     Height 07/01/22 1847 1.88 m ('6\' 2"'$ )     Head Circumference --      Peak Flow --      Pain Score 07/01/22 1846 5     Pain Loc --      Pain Edu? --      Excl. in [place]? --     Most recent vital signs: Vitals:   07/02/22 0335 07/02/22 0340  BP: (!) 153/78 136/72  Pulse: 66 78  Resp: 17 14  Temp:    SpO2: 97% 96%     General: Awake, no distress.  Generally well-appearing in spite of his age. CV:  Good peripheral perfusion.  Normal heart sounds. Resp:  Normal effort.  Lungs are clear to auscultation bilaterally. Abd:  No distention.  Tenderness to palpation of the epigastrium and right upper  quadrant with positive Murphy sign.  No abdominal bruit. Other:  No focal neurological deficits.  Patient is awake and alert, oriented, pleasant, mood and affect appropriate under the circumstances.   ED Results / Procedures / Treatments   Labs (all labs ordered are listed, but only abnormal results are displayed) Labs Reviewed  BASIC METABOLIC PANEL - Abnormal; Notable for the following components:      Result Value   Glucose, Bld 123 (*)    BUN 35 (*)    Creatinine, Ser 1.79 (*)    GFR, Estimated 38 (*)    All other components within normal limits  HEPATIC FUNCTION PANEL - Abnormal; Notable for the following components:   Alkaline Phosphatase 26 (*)    All other components within normal limits  TROPONIN I (HIGH SENSITIVITY) - Abnormal; Notable for the following components:   Troponin I (High Sensitivity) 21 (*)    All other components within normal limits  TROPONIN I (HIGH SENSITIVITY) - Abnormal; Notable for the following components:  Troponin I (High Sensitivity) 39 (*)    All other components within normal limits  TROPONIN I (HIGH SENSITIVITY) - Abnormal; Notable for the following components:   Troponin I (High Sensitivity) 51 (*)    All other components within normal limits  CBC  LIPASE, BLOOD  APTT  PROTIME-INR     EKG  ED ECG REPORT I, Hinda Kehr, the attending physician, personally viewed and interpreted this ECG.  Date: 07/01/2022 EKG Time: 18: 52 Rate: 85 Rhythm: normal sinus rhythm QRS Axis: normal Intervals: normal ST/T Wave abnormalities: Non-specific ST segment / T-wave changes, but no clear evidence of acute ischemia. Narrative Interpretation: no definitive evidence of acute ischemia; does not meet STEMI criteria.  ED ECG REPORT I, Hinda Kehr, the attending physician, personally viewed and interpreted this ECG.  Date: 07/02/2022 EKG Time: 4:38 AM Rate: 65 Rhythm: normal sinus rhythm QRS Axis: normal Intervals: normal ST/T Wave  abnormalities: Non-specific ST segment / T-wave changes, but no clear evidence of acute ischemia. Narrative Interpretation: no definitive evidence of acute ischemia; does not meet STEMI criteria.   RADIOLOGY I viewed and interpreted the patient's two-view chest x-ray.  I see no evidence of pneumonia, pneumothorax, nor other acute abnormality.  I also read the radiologist's report, which confirmed no acute findings.    PROCEDURES:  Critical Care performed: Yes, see critical care procedure note(s)  .Critical Care  Performed by: Hinda Kehr, MD Authorized by: Hinda Kehr, MD   Critical care provider statement:    Critical care time (minutes):  45   Critical care time was exclusive of:  Separately billable procedures and treating other patients   Critical care was necessary to treat or prevent imminent or life-threatening deterioration of the following conditions:  Cardiac failure (ACS on heparin)   Critical care was time spent personally by me on the following activities:  Development of treatment plan with patient or surrogate, evaluation of patient's response to treatment, examination of patient, obtaining history from patient or surrogate, ordering and performing treatments and interventions, ordering and review of laboratory studies, ordering and review of radiographic studies, pulse oximetry, re-evaluation of patient's condition and review of old charts .1-3 Lead EKG Interpretation  Performed by: Hinda Kehr, MD Authorized by: Hinda Kehr, MD     Interpretation: normal     ECG rate:  79   ECG rate assessment: normal     Rhythm: sinus rhythm     Ectopy: none     Conduction: normal      MEDICATIONS ORDERED IN ED: Medications  nitroGLYCERIN (NITROSTAT) SL tablet 0.4 mg (0.4 mg Sublingual Given 07/02/22 0333)  aspirin chewable tablet 324 mg (324 mg Oral Given 07/02/22 0435)     IMPRESSION / MDM / ASSESSMENT AND PLAN / ED COURSE  I reviewed the triage vital signs and the  nursing notes.                              Differential diagnosis includes, but is not limited to, ACS, PE, pneumonia, pneumothorax, biliary colic or other gallbladder disease, acid reflux.  Patient's presentation is most consistent with acute presentation with potential threat to life or bodily function.  Labs/studies ordered: EKG, two-view chest x-ray, high-sensitivity troponin x2, hepatic function panel, lipase, basic metabolic panel, CBC.  I viewed and interpreted the patient's lab results.  He has chronic kidney disease and his creatinine is stable at 1.79.  His hepatic function tests and lipase are  within normal limits.  CBC is within normal limits.  His high-sensitivity troponin is typically normal but his first result was 21, and the second went up to 39.  Unclear clinical significance given the consistency of his epigastric and right upper quadrant pain.  Based on the physical exam, I think that biliary colic is very likely.  I talked with the patient about this and he agrees with the plan for ultrasound.  I ordered ultrasound of the right upper quadrant to rule out gallbladder disease, and given his age, I also ordered an aortic ultrasound.  He declined any medications at this time.  Since we are awaiting the results and he had this nonspecific elevation of his troponin, I am also repeating a high-sensitivity troponin to see if it continues to go up or if it is stabilized or is coming back down.  The patient is on the cardiac monitor to evaluate for evidence of arrhythmia and/or significant heart rate changes.   Clinical Course as of 07/02/22 0437  Thu Jul 02, 2022  0228 Hepatic function panel and lipase are both within normal limits. [CF]  [id number] Patient is still having pain.  Ultrasounds are still in progress.  His blood pressure remains high and his third troponin has gone up to 51.  At this point I think we are going have to keep him if for no other reason given the concern of the  ever increasing troponin.  I ordered a sublingual nitroglycerin 0.4 mg and an inch of nitroglycerin paste.  We will see if this helps with the symptoms.  Once his ultrasounds are back, anticipate admission [CF]  0430 I viewed and interpreted the patient's ultrasound of the right upper quadrant and aorta.  There are no acute abnormalities such as cholecystitis or gallstones and the aorta shows no sign of AAA.  Radiology reports confirmed no acute findings.  Under the circumstances, I am concerned that the patient is having an NSTEMI.  When I reevaluated him, he confirmed that his pain went away with the nitro spray.  His blood pressure is now controlled.  I ordered heparin bolus plus infusion for unstable angina versus NSTEMI.  I also ordered 324 mg aspirin by mouth.  I talked with the patient and his family member and they are both very much in agreement with the current plan.  I am consulting the hospitalist for admission. [CF]  [id number] Consulted with Dr. Sidney Ace with the hospitalist service by secure chat text.  He will admit the patient. [CF]    Clinical Course User Index [CF] Hinda Kehr, MD     FINAL CLINICAL IMPRESSION(S) / ED DIAGNOSES   Final diagnoses:  Unstable angina ([place])  Atypical chest pain  Elevated troponin level     Rx / DC Orders   ED Discharge Orders     None        Note:  This document was prepared using Dragon voice recognition software and may include unintentional dictation errors.   Hinda Kehr, MD 07/02/22 4315    Hinda Kehr, MD 07/02/22 [id number]

## 2022-07-03 ENCOUNTER — Other Ambulatory Visit (HOSPITAL_COMMUNITY): Payer: Self-pay

## 2022-07-03 ENCOUNTER — Telehealth (HOSPITAL_COMMUNITY): Payer: Self-pay

## 2022-07-03 DIAGNOSIS — I208 Other forms of angina pectoris: Secondary | ICD-10-CM

## 2022-07-03 DIAGNOSIS — I25118 Atherosclerotic heart disease of native coronary artery with other forms of angina pectoris: Secondary | ICD-10-CM | POA: Diagnosis not present

## 2022-07-03 DIAGNOSIS — N1832 Chronic kidney disease, stage 3b: Secondary | ICD-10-CM | POA: Diagnosis not present

## 2022-07-03 DIAGNOSIS — I10 Essential (primary) hypertension: Secondary | ICD-10-CM | POA: Diagnosis not present

## 2022-07-03 DIAGNOSIS — R778 Other specified abnormalities of plasma proteins: Secondary | ICD-10-CM | POA: Insufficient documentation

## 2022-07-03 DIAGNOSIS — R1011 Right upper quadrant pain: Secondary | ICD-10-CM | POA: Diagnosis not present

## 2022-07-03 DIAGNOSIS — I255 Ischemic cardiomyopathy: Secondary | ICD-10-CM | POA: Diagnosis not present

## 2022-07-03 LAB — CBC
HCT: 37.6 % — ABNORMAL LOW (ref 39.0–52.0)
Hemoglobin: 12.6 g/dL — ABNORMAL LOW (ref 13.0–17.0)
MCH: 31.7 pg (ref 26.0–34.0)
MCHC: 33.5 g/dL (ref 30.0–36.0)
MCV: 94.5 fL (ref 80.0–100.0)
Platelets: 196 10*3/uL (ref 150–400)
RBC: 3.98 MIL/uL — ABNORMAL LOW (ref 4.22–5.81)
RDW: 14.1 % (ref 11.5–15.5)
WBC: 8.3 10*3/uL (ref 4.0–10.5)
nRBC: 0 % (ref 0.0–0.2)

## 2022-07-03 LAB — BASIC METABOLIC PANEL
Anion gap: 5 (ref 5–15)
BUN: 32 mg/dL — ABNORMAL HIGH (ref 8–23)
CO2: 25 mmol/L (ref 22–32)
Calcium: 8.8 mg/dL — ABNORMAL LOW (ref 8.9–10.3)
Chloride: 109 mmol/L (ref 98–111)
Creatinine, Ser: 1.74 mg/dL — ABNORMAL HIGH (ref 0.61–1.24)
GFR, Estimated: 39 mL/min — ABNORMAL LOW (ref >60)
Glucose, Bld: 117 mg/dL — ABNORMAL HIGH (ref 70–99)
Potassium: 4.6 mmol/L (ref 3.5–5.1)
Sodium: 139 mmol/L (ref 135–145)

## 2022-07-03 LAB — CBG MONITORING, ED
Glucose-Capillary: 112 mg/dL — ABNORMAL HIGH (ref 70–99)
Glucose-Capillary: 116 mg/dL — ABNORMAL HIGH (ref 70–99)

## 2022-07-03 LAB — HEPARIN LEVEL (UNFRACTIONATED): Heparin Unfractionated: 0.92 IU/mL — ABNORMAL HIGH (ref 0.30–0.70)

## 2022-07-03 MED ORDER — ACETAMINOPHEN 325 MG PO TABS: 650.0000 mg | ORAL_TABLET | ORAL | Status: DC | PRN

## 2022-07-03 NOTE — TOC Benefit Eligibility Note (Signed)
Patient Research scientist (life sciences) completed.     The patient is currently admitted and upon discharge could be taking [place] and [place].   The current 30 day co-pay is, $45 for both.   The patient is insured through [employer].

## 2022-07-03 NOTE — Telephone Encounter (Signed)
Pharmacy Patient Advocate Encounter  Insurance verification completed.    The patient is insured through [employer]   The patient is currently admitted and ran test claims for the following: [place] and [place].  Copays and coinsurance results were relayed to Inpatient clinical team.

## 2022-07-03 NOTE — Progress Notes (Signed)
Rounding Note    Patient Name: Luis Sweeney Date of Encounter: 07/03/2022  [place] Cardiologist: Sherren Mocha, MD   Subjective   Reports feeling well this morning, family at the bedside Denies any further right upper quadrant discomfort Stress test yesterday, no significant ischemia, normal ejection fraction Feels well and would like to go home  Inpatient Medications    Scheduled Meds:  aspirin EC  81 mg Oral Daily   cholecalciferol  5,000 Units Oral Weekly   cyanocobalamin  1,000 mcg Oral Daily   insulin aspart  0-5 Units Subcutaneous QHS   insulin aspart  0-9 Units Subcutaneous TID WC   pantoprazole  40 mg Oral Daily   pravastatin  40 mg Oral Once per day on Mon Wed Fri   ramipril  5 mg Oral BID   Continuous Infusions:  PRN Meds: acetaminophen, ALPRAZolam, HYDROmorphone (DILAUDID) injection, magnesium hydroxide, nitroGLYCERIN, ondansetron (ZOFRAN) IV, traZODone   Vital Signs    Vitals:   07/02/22 2136 07/03/22 0000 07/03/22 0400 07/03/22 0800  BP: (!) 137/53 (!) 133/58 (!) 115/41 (!) 132/50  Pulse: 63 65 (!) 57 94  Resp: '14 15 12 15  '$ Temp: 98.2 F (36.8 C) (!) 97.3 F (36.3 C) (!) 97.4 F (36.3 C) 98.2 F (36.8 C)  TempSrc: Oral Oral Oral Oral  SpO2: 95% 94% 94% 99%  Weight:      Height:        Intake/Output Summary (Last 24 hours) at 07/03/2022 1018 Last data filed at 07/03/2022 0400 Gross per 24 hour  Intake 345.4 ml  Output 1125 ml  Net -779.6 ml      07/01/2022    6:47 PM 03/23/2022    2:05 PM 11/18/2021    8:43 AM  Last 3 Weights  Weight (lbs) 240 lb 240 lb 240 lb 4.8 oz  Weight (kg) 108.863 kg 108.863 kg 109 kg      Telemetry    Normal sinus rhythm- Personally Reviewed  ECG     - Personally Reviewed  Physical Exam   GEN: No acute distress.   Neck: No JVD Cardiac: RRR, no murmurs, rubs, or gallops.  Respiratory: Clear to auscultation bilaterally. GI: Soft, nontender, non-distended  MS: No edema; No  deformity. Neuro:  Nonfocal  Psych: Normal affect   Labs    High Sensitivity Troponin:   Recent Labs  Lab 07/01/22 1856 07/01/22 2242 07/02/22 0247  TROPONINIHS 21* 39* 51*     Chemistry Recent Labs  Lab 07/01/22 1856 07/01/22 2242 07/02/22 0247 07/03/22 0718  NA 137  --   --  139  K 4.7  --   --  4.6  CL 107  --   --  109  CO2 23  --   --  25  GLUCOSE 123*  --   --  117*  BUN 35*  --   --  32*  CREATININE 1.79*  --   --  1.74*  CALCIUM 9.1  --   --  8.8*  MG  --   --  2.0  --   PROT  --  7.0  --   --   ALBUMIN  --  4.1  --   --   AST  --  18  --   --   ALT  --  14  --   --   ALKPHOS  --  26*  --   --   BILITOT  --  0.3  --   --  GFRNONAA 38*  --   --  39*  ANIONGAP 7  --   --  5    Lipids No results for input(s): "CHOL", "TRIG", "HDL", "LABVLDL", "LDLCALC", "CHOLHDL" in the last 168 hours.  Hematology Recent Labs  Lab 07/01/22 1856 07/03/22 0718  WBC 10.5 8.3  RBC 4.39 3.98*  HGB 13.6 12.6*  HCT 41.6 37.6*  MCV 94.8 94.5  MCH 31.0 31.7  MCHC 32.7 33.5  RDW 14.2 14.1  PLT 230 196   Thyroid  Recent Labs  Lab 07/02/22 0247  TSH 3.158    BNPNo results for input(s): "BNP", "PROBNP" in the last 168 hours.  DDimer No results for input(s): "DDIMER" in the last 168 hours.   Radiology    NM Myocar Multi W/Spect W/Wall Motion / EF  Result Date: 07/02/2022 Pharmacological myocardial perfusion imaging study with no significant  ischemia Normal wall motion, EF estimated at 57% No EKG changes concerning for ischemia at peak stress or in recovery. CT attenuation correction images with moderate coronary calcification noted in the LAD, minimal aortic atherosclerosis Low risk scan Signed, Esmond Plants, MD, Ph.D [facility]   US ABDOMEN LIMITED RUQ (LIVER/GB)  Result Date: 07/02/2022 CLINICAL DATA:  Epigastric and right upper quadrant pain with radiation through to the back. EXAM: ULTRASOUND ABDOMEN LIMITED RIGHT UPPER QUADRANT COMPARISON:  CT without contrast  05/29/2021. FINDINGS: Gallbladder: No gallstones or wall thickening visualized. No sonographic Murphy sign noted by sonographer. Common bile duct: Diameter: 2.4 mm with no intrahepatic biliary prominence. Liver: Septated cyst again noted in the left lobe lateral segment measuring 1.6 cm, unchanged. Thin walled simple cyst in the peripheral right lobe measuring 4.1 cm, previously 4.5 cm. Thin walled simple cyst deeper in the right lobe measures 1 cm, previously 1 cm. Otherwise within normal limits in parenchymal echogenicity. Portal vein is patent on color Doppler imaging with normal direction of blood flow towards the liver. Other: No free fluid is seen. IMPRESSION: 1. Unremarkable gallbladder wall and lumen. 2. Hepatic cysts. 3. No acute sonographic findings. Electronically Signed   By: Telford Nab M.D.   On: 07/02/2022 04:02   US AORTA DUPLEX LIMITED  Result Date: 07/02/2022 CLINICAL DATA:  Epigastric and right upper quadrant pain radiating to the back. EXAM: ULTRASOUND OF ABDOMINAL AORTA TECHNIQUE: Ultrasound examination of the abdominal aorta and proximal common iliac arteries was performed to evaluate for aneurysm. Additional color and Doppler images of the distal aorta were obtained to document patency. COMPARISON:  CT without contrast 05/29/2021. FINDINGS: There is mild-to-moderate echogenic calcific aortoiliac wall plaque, better seen on CT. Abdominal aortic measurements as follows (AP and transverse axes, respectively): Proximal:  2.7 x 2.4 cm Mid:  2.4 x 2.2 cm Distal:  1.9 x 1.7 cm Patent: Yes, peak systolic velocity is 161.0 cm/s Right common iliac artery: 1.2 x 1.7 cm Left common iliac artery: 1.3 x 1.3 cm IMPRESSION: 1. Aortic atherosclerosis with 2.7 cm AAA. Recommend follow-up every 5 years. Reference: J Am Coll Radiol 9604;54:098-119. 2. No grayscale evident flow-limiting stenosis or dissection. Limited image detail due to habitus. 3. Ectasia in the common iliac arteries, more so on the  right. Electronically Signed   By: Telford Nab M.D.   On: 07/02/2022 03:56   DG Chest 2 View  Result Date: 07/01/2022 CLINICAL DATA:  Chest pain. EXAM: CHEST - 2 VIEW COMPARISON:  Chest radiograph dated 11/21/2020. FINDINGS: No focal consolidation, pleural effusion, or pneumothorax. The cardiac silhouette is within normal limits. Degenerative changes of  the spine. No acute osseous pathology. IMPRESSION: No active cardiopulmonary disease. Electronically Signed   By: Anner Crete M.D.   On: 07/01/2022 19:10    Cardiac Studies     Patient Profile     EUAN WANDLER is a 81 y.o. male with a history of CAD s/p prior LAD stenting, HTN, HL (mult statin intolerances), CKD III, ICM (EF 45-50% 04/2021), and anemia , who is being seen for the evaluation of elevated troponin  Assessment & Plan    1. Epigastric/Abdominal pain:   Stress test yesterday, no significant ischemia Right upper quadrant ultrasound unremarkable Currently without symptoms Family questioning whether HIDA scan indicated, will defer to medicine service Reports that he ate breakfast without pain  2. Coronary artery disease/elevated troponin:  Troponin up to 50 Stress testing yesterday as above, no significant ischemia Plan to continue outpatient medications  3. Essential hypertension:  Blood pressure is well controlled on today's visit. No changes made to the medications.  4. Hyperlipidemia:  LDL 70 01/2020.  Continue lovastatin   5. Stage 3b chronic kidney disease:  creatinine stable @  1.79.  On low-dose ramipril as outpatient, will continue   6. ICM:   Ef 45-50% by echo last year.   He was euvolemic Euvolemic.  Cont acei.   Hold on beta-blocker given heart rate into the 50s overnight   Total encounter time more than 35 minutes  Greater than 50% was spent in counseling and coordination of care with the patient   For questions or updates, please contact [place] Please consult [email or web address]  for contact info under        Signed, Ida Rogue, MD  07/03/2022, 10:18 AM

## 2022-07-03 NOTE — Discharge Summary (Signed)
Physician Discharge Summary   Patient: Luis Sweeney MRN: [id number] DOB: 1941/10/03  Admit date:     07/02/2022  Discharge date: 07/03/22  Discharge Physician: Fritzi Mandes   PCP: Ria Bush, MD   Recommendations at discharge:    F/U PCP on your scheduled appt  Discharge Diagnoses:  Chest pain-atypical  Hospital Course:  Luis Sweeney is a 81 y.o. male with medical history significant of prior history of coronary artery disease, ischemic cardiomyopathy/systolic heart failure recent ejection fraction 45 to 50% (echo July 2023), gastroesophageal reflux disease with history of peptic ulcer, hypertension, hyperlipidemia, class I obesity, chronic kidney disease stage IIIb, anxiety and B12 deficiency; who presented to the hospital secondary to chest discomfort.  Patient reports symptoms For approximately 6-7 hours prior to admission, localizing his lower chest upper midepigastric region and radiated to right upper quadrant and to his back.   Chest pain - No acute ischemic changes appreciated on EKG -Patient with atypical features; resolution of pain with the use of nitroglycerin and significant risk factors (heart score 5). -Patient's troponin flat -Started on aspirin, continue ramipril, statin and as needed nitroglycerin. -NM stress test no ischemia --pt cp pain free --no further cardiac w/u needed per Dr Rockey Situ  Right Upper quadrant pain --US Gallbladder negative for GB issue --pt's LFT's ok --advised pt to consider HIDA scan as out pt if needed--voiced understanding. Dter Jenny Reichmann aware   Ischemic cardiomyopathy - Recent echo demonstrating EF 45-50% -Continue ACE inhibitors -Follow daily weights, strict intake and output -Heart healthy/low-sodium diet discussed with patient.   AAA (abdominal aortic aneurysm) (HCC) -2.7 cm aneurysm appreciated -Following radiology recommendations patient will benefit of every 5 years ultrasound surveillance. -continue risk factor  modification.   Anxiety -Continue as needed Xanax -Overall stable mood.   Essential hypertension - Stable currently -Continue current antihypertensive agents -Follow-up vital signs.   Type 2 diabetes with nephropathy ([place]) -Patient was just following diet prior to admission -Started sliding scale insulin while inpatient. -Follow CBGs repletion. --defer to PCP   Obesity, Class I, BMI 30-34.9 -Body mass index is 30.81 kg/m. -Low-calorie diet, portion control and increase physical activity discussed with patient.   Hx of peptic ulcer - Patient with prior history of peptic ulcer; was not taking PPIs at time of admission - on Protonix for GERD.   B12 deficiency -continue B12 supplementation.   Chronic renal disease, stage 3, moderately decreased glomerular filtration rate between 30-59 mL/min/1.73 square meter (HCC) - Appears to be stable and at baseline -Minimize nephrotoxic agents and follow renal function trend.    Hyperlipidemia associated with type 2 diabetes mellitus ([place]) -Continue statin    feels back to baseline. Will discharge to home.       Advance Care Planning:   Code Status: Full Code    Consults: Cardiology service.   Family Communication: spoke with [name] dter      Consultants: [facility] cardiology Procedures performed: NM stress test  Disposition: Home Diet recommendation:  Discharge Diet Orders (From admission, onward)     Start     Ordered   07/03/22 0000  Diet - low sodium heart healthy        07/03/22 1137           Cardiac and Carb modified diet DISCHARGE MEDICATION: Allergies as of 07/03/2022       Reactions   Prednisone Other (See Comments)   Feels really bad   Codeine Hives, Itching   Lipitor [atorvastatin] Other (See Comments)  Reaction:  Muscle pain    Morphine Hives, Itching   Simvastatin Other (See Comments)   Reaction:  Muscle pain         Medication List     STOP taking these medications    methocarbamol  500 MG tablet Commonly known as: ROBAXIN       TAKE these medications    aspirin EC 81 MG tablet Take 81 mg by mouth daily.   celecoxib 200 MG capsule Commonly known as: CELEBREX   cyanocobalamin 1000 MCG tablet Commonly known as: VITAMIN B12 Take 1,000 mcg by mouth daily.   lovastatin 40 MG tablet Commonly known as: MEVACOR TAKE 1 TABLET BY MOUTH EVERY MONDAY, WEDNESDAY, AND FRIDAY   meloxicam 7.5 MG tablet Commonly known as: Mobic Take 1 tablet (7.5 mg total) by mouth daily.   pantoprazole 40 MG tablet Commonly known as: PROTONIX Take 1 tablet (40 mg total) by mouth daily as needed (GERD symptoms).   ramipril 5 MG capsule Commonly known as: ALTACE Take 1 capsule (5 mg total) by mouth 2 (two) times daily.   Vitamin D 125 MCG (5000 UT) Caps Take 1 capsule by mouth once a week.        Discharge Exam: Filed Weights   07/01/22 1847  Weight: 108.9 kg     Condition at discharge: fair  The results of significant diagnostics from this hospitalization (including imaging, microbiology, ancillary and laboratory) are listed below for reference.   Imaging Studies: NM Myocar Multi W/Spect W/Wall Motion / EF  Result Date: 07/02/2022 Pharmacological myocardial perfusion imaging study with no significant  ischemia Normal wall motion, EF estimated at 57% No EKG changes concerning for ischemia at peak stress or in recovery. CT attenuation correction images with moderate coronary calcification noted in the LAD, minimal aortic atherosclerosis Low risk scan Signed, Esmond Plants, MD, Ph.D [facility]   US ABDOMEN LIMITED RUQ (LIVER/GB)  Result Date: 07/02/2022 CLINICAL DATA:  Epigastric and right upper quadrant pain with radiation through to the back. EXAM: ULTRASOUND ABDOMEN LIMITED RIGHT UPPER QUADRANT COMPARISON:  CT without contrast 05/29/2021. FINDINGS: Gallbladder: No gallstones or wall thickening visualized. No sonographic Murphy sign noted by sonographer. Common bile  duct: Diameter: 2.4 mm with no intrahepatic biliary prominence. Liver: Septated cyst again noted in the left lobe lateral segment measuring 1.6 cm, unchanged. Thin walled simple cyst in the peripheral right lobe measuring 4.1 cm, previously 4.5 cm. Thin walled simple cyst deeper in the right lobe measures 1 cm, previously 1 cm. Otherwise within normal limits in parenchymal echogenicity. Portal vein is patent on color Doppler imaging with normal direction of blood flow towards the liver. Other: No free fluid is seen. IMPRESSION: 1. Unremarkable gallbladder wall and lumen. 2. Hepatic cysts. 3. No acute sonographic findings. Electronically Signed   By: Telford Nab M.D.   On: 07/02/2022 04:02   US AORTA DUPLEX LIMITED  Result Date: 07/02/2022 CLINICAL DATA:  Epigastric and right upper quadrant pain radiating to the back. EXAM: ULTRASOUND OF ABDOMINAL AORTA TECHNIQUE: Ultrasound examination of the abdominal aorta and proximal common iliac arteries was performed to evaluate for aneurysm. Additional color and Doppler images of the distal aorta were obtained to document patency. COMPARISON:  CT without contrast 05/29/2021. FINDINGS: There is mild-to-moderate echogenic calcific aortoiliac wall plaque, better seen on CT. Abdominal aortic measurements as follows (AP and transverse axes, respectively): Proximal:  2.7 x 2.4 cm Mid:  2.4 x 2.2 cm Distal:  1.9 x 1.7 cm Patent: Yes, peak  systolic velocity is 161.0 cm/s Right common iliac artery: 1.2 x 1.7 cm Left common iliac artery: 1.3 x 1.3 cm IMPRESSION: 1. Aortic atherosclerosis with 2.7 cm AAA. Recommend follow-up every 5 years. Reference: J Am Coll Radiol 9604;54:098-119. 2. No grayscale evident flow-limiting stenosis or dissection. Limited image detail due to habitus. 3. Ectasia in the common iliac arteries, more so on the right. Electronically Signed   By: Telford Nab M.D.   On: 07/02/2022 03:56   DG Chest 2 View  Result Date: 07/01/2022 CLINICAL DATA:  Chest  pain. EXAM: CHEST - 2 VIEW COMPARISON:  Chest radiograph dated 11/21/2020. FINDINGS: No focal consolidation, pleural effusion, or pneumothorax. The cardiac silhouette is within normal limits. Degenerative changes of the spine. No acute osseous pathology. IMPRESSION: No active cardiopulmonary disease. Electronically Signed   By: Anner Crete M.D.   On: 07/01/2022 19:10    Microbiology: Results for orders placed or performed during the hospital encounter of 05/29/21  Resp Panel by RT-PCR (Flu A&B, Covid) Nasopharyngeal Swab     Status: None   Collection Time: 05/29/21  1:46 AM   Specimen: Nasopharyngeal Swab; Nasopharyngeal(NP) swabs in vial transport medium  Result Value Ref Range Status   SARS Coronavirus 2 by RT PCR NEGATIVE NEGATIVE Final    Comment: (NOTE) SARS-CoV-2 target nucleic acids are NOT DETECTED.  The SARS-CoV-2 RNA is generally detectable in upper respiratory specimens during the acute phase of infection. The lowest concentration of SARS-CoV-2 viral copies this assay can detect is 138 copies/mL. A negative result does not preclude SARS-Cov-2 infection and should not be used as the sole basis for treatment or other patient management decisions. A negative result may occur with  improper specimen collection/handling, submission of specimen other than nasopharyngeal swab, presence of viral mutation(s) within the areas targeted by this assay, and inadequate number of viral copies(<138 copies/mL). A negative result must be combined with clinical observations, patient history, and epidemiological information. The expected result is Negative.  Fact Sheet for Patients:  EntrepreneurPulse.com.au  Fact Sheet for Healthcare Providers:  IncredibleEmployment.be  This test is no t yet approved or cleared by the [place] FDA and  has been authorized for detection and/or diagnosis of SARS-CoV-2 by FDA under an Emergency Use Authorization  (EUA). This EUA will remain  in effect (meaning this test can be used) for the duration of the COVID-19 declaration under Section 564(b)(1) of the Act, 21 U.S.C.section 360bbb-3(b)(1), unless the authorization is terminated  or revoked sooner.       Influenza A by PCR NEGATIVE NEGATIVE Final   Influenza B by PCR NEGATIVE NEGATIVE Final    Comment: (NOTE) The Xpert Xpress SARS-CoV-2/FLU/RSV plus assay is intended as an aid in the diagnosis of influenza from Nasopharyngeal swab specimens and should not be used as a sole basis for treatment. Nasal washings and aspirates are unacceptable for Xpert Xpress SARS-CoV-2/FLU/RSV testing.  Fact Sheet for Patients: EntrepreneurPulse.com.au  Fact Sheet for Healthcare Providers: IncredibleEmployment.be  This test is not yet approved or cleared by the [place] FDA and has been authorized for detection and/or diagnosis of SARS-CoV-2 by FDA under an Emergency Use Authorization (EUA). This EUA will remain in effect (meaning this test can be used) for the duration of the COVID-19 declaration under Section 564(b)(1) of the Act, 21 U.S.C. section 360bbb-3(b)(1), unless the authorization is terminated or revoked.  Performed at [facility], [address] [place], [place] [address]     Labs: CBC: Recent Labs  Lab  07/01/22 1856 07/03/22 0718  WBC 10.5 8.3  HGB 13.6 12.6*  HCT 41.6 37.6*  MCV 94.8 94.5  PLT 230 462   Basic Metabolic Panel: Recent Labs  Lab 07/01/22 1856 07/02/22 0247 07/03/22 0718  NA 137  --  139  K 4.7  --  4.6  CL 107  --  109  CO2 23  --  25  GLUCOSE 123*  --  117*  BUN 35*  --  32*  CREATININE 1.79*  --  1.74*  CALCIUM 9.1  --  8.8*  MG  --  2.0  --   PHOS  --  3.4  --    Liver Function Tests: Recent Labs  Lab 07/01/22 2242  AST 18  ALT 14  ALKPHOS 26*  BILITOT 0.3  PROT 7.0  ALBUMIN 4.1   CBG: Recent Labs  Lab 07/02/22 1155 07/02/22 1645  07/02/22 2137 07/03/22 0744  GLUCAP 112* 123* 123* 112*    Discharge time spent: greater than 30 minutes.  Signed: Fritzi Mandes, MD [facility] 07/03/2022

## 2022-07-04 LAB — LIPOPROTEIN A (LPA): Lipoprotein (a): 74.4 nmol/L — ABNORMAL HIGH (ref <75.0)

## 2022-07-08 ENCOUNTER — Encounter (HOSPITAL_COMMUNITY): Payer: Self-pay | Admitting: Emergency Medicine

## 2022-07-08 ENCOUNTER — Telehealth: Payer: Self-pay

## 2022-07-08 ENCOUNTER — Emergency Department (HOSPITAL_COMMUNITY): Payer: PPO

## 2022-07-08 ENCOUNTER — Emergency Department (HOSPITAL_COMMUNITY)
Admission: EM | Admit: 2022-07-08 | Discharge: 2022-07-09 | Disposition: A | Discharge status: Home / Self Care | Payer: PPO | Attending: Emergency Medicine | Admitting: Emergency Medicine

## 2022-07-08 DIAGNOSIS — K4020 Bilateral inguinal hernia, without obstruction or gangrene, not specified as recurrent: Secondary | ICD-10-CM | POA: Diagnosis not present

## 2022-07-08 DIAGNOSIS — I2510 Atherosclerotic heart disease of native coronary artery without angina pectoris: Secondary | ICD-10-CM | POA: Insufficient documentation

## 2022-07-08 DIAGNOSIS — K429 Umbilical hernia without obstruction or gangrene: Secondary | ICD-10-CM | POA: Diagnosis not present

## 2022-07-08 DIAGNOSIS — I10 Essential (primary) hypertension: Secondary | ICD-10-CM | POA: Diagnosis not present

## 2022-07-08 DIAGNOSIS — Z7982 Long term (current) use of aspirin: Secondary | ICD-10-CM | POA: Diagnosis not present

## 2022-07-08 DIAGNOSIS — R079 Chest pain, unspecified: Secondary | ICD-10-CM | POA: Diagnosis not present

## 2022-07-08 DIAGNOSIS — K5730 Diverticulosis of large intestine without perforation or abscess without bleeding: Secondary | ICD-10-CM | POA: Diagnosis not present

## 2022-07-08 DIAGNOSIS — K7689 Other specified diseases of liver: Secondary | ICD-10-CM | POA: Diagnosis not present

## 2022-07-08 DIAGNOSIS — R0789 Other chest pain: Secondary | ICD-10-CM | POA: Insufficient documentation

## 2022-07-08 DIAGNOSIS — R1013 Epigastric pain: Secondary | ICD-10-CM | POA: Diagnosis not present

## 2022-07-08 DIAGNOSIS — I700 Atherosclerosis of aorta: Secondary | ICD-10-CM | POA: Diagnosis not present

## 2022-07-08 DIAGNOSIS — J929 Pleural plaque without asbestos: Secondary | ICD-10-CM | POA: Diagnosis not present

## 2022-07-08 LAB — CBC WITH DIFFERENTIAL/PLATELET
Abs Immature Granulocytes: 0.03 10*3/uL (ref 0.00–0.07)
Basophils Absolute: 0 10*3/uL (ref 0.0–0.1)
Basophils Relative: 0 %
Eosinophils Absolute: 0.1 10*3/uL (ref 0.0–0.5)
Eosinophils Relative: 1 %
HCT: 44.7 % (ref 39.0–52.0)
Hemoglobin: 14.7 g/dL (ref 13.0–17.0)
Immature Granulocytes: 0 %
Lymphocytes Relative: 19 %
Lymphs Abs: 1.9 10*3/uL (ref 0.7–4.0)
MCH: 31.6 pg (ref 26.0–34.0)
MCHC: 32.9 g/dL (ref 30.0–36.0)
MCV: 96.1 fL (ref 80.0–100.0)
Monocytes Absolute: 0.7 10*3/uL (ref 0.1–1.0)
Monocytes Relative: 6 %
Neutro Abs: 7.4 10*3/uL (ref 1.7–7.7)
Neutrophils Relative %: 74 %
Platelets: 253 10*3/uL (ref 150–400)
RBC: 4.65 MIL/uL (ref 4.22–5.81)
RDW: 14.4 % (ref 11.5–15.5)
WBC: 10.1 10*3/uL (ref 4.0–10.5)
nRBC: 0 % (ref 0.0–0.2)

## 2022-07-08 LAB — COMPREHENSIVE METABOLIC PANEL
ALT: 13 U/L (ref 0–44)
AST: 18 U/L (ref 15–41)
Albumin: 4 g/dL (ref 3.5–5.0)
Alkaline Phosphatase: 29 U/L — ABNORMAL LOW (ref 38–126)
Anion gap: 8 (ref 5–15)
BUN: 36 mg/dL — ABNORMAL HIGH (ref 8–23)
CO2: 22 mmol/L (ref 22–32)
Calcium: 9.4 mg/dL (ref 8.9–10.3)
Chloride: 106 mmol/L (ref 98–111)
Creatinine, Ser: 1.98 mg/dL — ABNORMAL HIGH (ref 0.61–1.24)
GFR, Estimated: 33 mL/min — ABNORMAL LOW (ref >60)
Glucose, Bld: 111 mg/dL — ABNORMAL HIGH (ref 70–99)
Potassium: 5.2 mmol/L — ABNORMAL HIGH (ref 3.5–5.1)
Sodium: 136 mmol/L (ref 135–145)
Total Bilirubin: 0.5 mg/dL (ref 0.3–1.2)
Total Protein: 6.7 g/dL (ref 6.5–8.1)

## 2022-07-08 LAB — TROPONIN I (HIGH SENSITIVITY): Troponin I (High Sensitivity): 10 ng/L (ref <18)

## 2022-07-08 MED ORDER — ONDANSETRON 4 MG PO TBDP
4.0000 mg | ORAL_TABLET | Freq: Once | ORAL | Status: AC
  Administered 2022-07-08: 4 mg via ORAL
  Filled 2022-07-08: qty 1

## 2022-07-08 MED ORDER — OXYCODONE-ACETAMINOPHEN 5-325 MG PO TABS
1.0000 | ORAL_TABLET | Freq: Once | ORAL | Status: AC
  Administered 2022-07-08: 1 via ORAL
  Filled 2022-07-08: qty 1

## 2022-07-08 NOTE — ED Triage Notes (Signed)
R sided chest pain that has been present for a week and previously admitted for. Has been evaluated for cholecystitis previously. Here today as pain is unmanageable.  R chest, radiation to back, denying SOB and dizziness.

## 2022-07-08 NOTE — Progress Notes (Signed)
Chronic Care Management Pharmacy Assistant   Name: Luis Sweeney  MRN: [id number] DOB: [birth year]  Reason for Encounter: CCM (Hosptial Follow Up)  Medications: Outpatient Encounter Medications as of 07/08/2022  Medication Sig Note   aspirin EC 81 MG tablet Take 81 mg by mouth daily.    celecoxib (CELEBREX) 200 MG capsule     Cholecalciferol (VITAMIN D) 125 MCG (5000 UT) CAPS Take 1 capsule by mouth once a week. 05/29/2021: Takes on sundays   lovastatin (MEVACOR) 40 MG tablet TAKE 1 TABLET BY MOUTH EVERY MONDAY, WEDNESDAY, AND FRIDAY    meloxicam (MOBIC) 7.5 MG tablet Take 1 tablet (7.5 mg total) by mouth daily.    pantoprazole (PROTONIX) 40 MG tablet Take 1 tablet (40 mg total) by mouth daily as needed (GERD symptoms).    ramipril (ALTACE) 5 MG capsule Take 1 capsule (5 mg total) by mouth 2 (two) times daily.    vitamin B-12 (CYANOCOBALAMIN) 1000 MCG tablet Take 1,000 mcg by mouth daily.    No facility-administered encounter medications on file as of 07/08/2022.   Reviewed hospital notes for details of recent visit. Has patient been contacted by Transitions of Care team? No Has patient seen PCP/specialist for hospital follow up (summarize OV if yes): No  Admitted to the hospital on 07/01/2022. Discharge date was 07/03/2022.  Discharged from [facility].   Discharge diagnosis (Principal Problem): Unstable angina, atypical chest pain and elevated troponin level Patient was discharged to Home  Brief summary of hospital course: Luis Sweeney is a 81 y.o. male with medical history significant of prior history of coronary artery disease, ischemic cardiomyopathy/systolic heart failure recent ejection fraction 45 to 50% (echo July 2023), gastroesophageal reflux disease with history of peptic ulcer, hypertension, hyperlipidemia, class I obesity, chronic kidney disease stage IIIb, anxiety and B12 deficiency; who presented to the hospital secondary to chest discomfort.  Patient  reports symptoms For approximately 6-7 hours prior to admission, localizing his lower chest upper midepigastric region and radiated to right upper quadrant and to his back.    Chest pain - No acute ischemic changes appreciated on EKG -Patient with atypical features; resolution of pain with the use of nitroglycerin and significant risk factors (heart score 5). -Patient's troponin flat -Started on aspirin, continue ramipril, statin and as needed nitroglycerin. -NM stress test no ischemia --pt cp pain free --no further cardiac w/u needed per Dr Rockey Situ   Right Upper quadrant pain --US Gallbladder negative for GB issue --pt's LFT's ok --advised pt to consider HIDA scan as out pt if needed--voiced understanding. Dter Jenny Reichmann aware   Ischemic cardiomyopathy - Recent echo demonstrating EF 45-50% -Continue ACE inhibitors -Follow daily weights, strict intake and output -Heart healthy/low-sodium diet discussed with patient.   AAA (abdominal aortic aneurysm) (HCC) -2.7 cm aneurysm appreciated -Following radiology recommendations patient will benefit of every 5 years ultrasound surveillance. -continue risk factor modification.   Anxiety -Continue as needed Xanax -Overall stable mood.   Essential hypertension - Stable currently -Continue current antihypertensive agents -Follow-up vital signs.   Type 2 diabetes with nephropathy ([place]) -Patient was just following diet prior to admission -Started sliding scale insulin while inpatient. -Follow CBGs repletion. --defer to PCP   Obesity, Class I, BMI 30-34.9 -Body mass index is 30.81 kg/m. -Low-calorie diet, portion control and increase physical activity discussed with patient.   Hx of peptic ulcer - Patient with prior history of peptic ulcer; was not taking PPIs at time of admission - on Protonix for  GERD.   B12 deficiency -continue B12 supplementation.   Chronic renal disease, stage 3, moderately decreased glomerular filtration rate  between 30-59 mL/min/1.73 square meter (HCC) - Appears to be stable and at baseline -Minimize nephrotoxic agents and follow renal function trend.    Hyperlipidemia associated with type 2 diabetes mellitus ([place]) -Continue statin    feels back to baseline. Will discharge to home.  New?Medications Started at [facility] Discharge:?? -Started aspirin EC 81 MG tablet -Started sliding scale of insuline  Medication Changes at Hospital Discharge: None  Medications Discontinued at Hospital Discharge: -Stopped methocarbamol (ROBAXIN) 500 MG tablet -Stopped methocarbamol 500 MG tablet  Medications that remain the same after Hospital Discharge:??  -All other medications will remain the same.    Next CCM appt: 11/02/2021  Other upcoming appts: Lab appointment on 07/15/2022 PCP appointment on 07/22/2022 for Physical  Charlene Brooke, PharmD notified and will determine if action is needed.  Charlene Brooke, CPP notified  Marijean Niemann, [place] Clinical Pharmacy Assistant [phone]

## 2022-07-08 NOTE — ED Provider Triage Note (Signed)
Emergency Medicine Provider Triage Evaluation Note  Luis Sweeney , a 81 y.o. male  was evaluated in triage.  Pt complains of severe right upper quadrant and chest pain and radiating to his back with associated nausea.  He has had intermittent pain all week but has been severe and constant all day.  He had a recent admission at [facility] where he looked at his heart and his gallbladder without any findings.  He made appoint with his gastroenterologist for December 1.  He was told he needed a HIDA scan.  He complains of "15 out of 10" pain at this time..  Review of Systems  Positive: Chest pain abdominal pain Negative: Vomiting  Physical Exam  There were no vitals taken for this visit. Gen:   Awake, no distress   Resp:  Normal effort  MSK:   Moves extremities without difficulty  Other:  Tenderness in the right upper quadrant  Medical Decision Making  Medically screening exam initiated at 9:22 PM.  Appropriate orders placed.  Luis Sweeney was informed that the remainder of the evaluation will be completed by another provider, this initial triage assessment does not replace that evaluation, and the importance of remaining in the ED until their evaluation is complete.  Work-up initiated   Margarita Mail, PA-C 07/08/22 2124

## 2022-07-09 DIAGNOSIS — I2510 Atherosclerotic heart disease of native coronary artery without angina pectoris: Secondary | ICD-10-CM | POA: Diagnosis not present

## 2022-07-09 DIAGNOSIS — K429 Umbilical hernia without obstruction or gangrene: Secondary | ICD-10-CM | POA: Diagnosis not present

## 2022-07-09 DIAGNOSIS — K4020 Bilateral inguinal hernia, without obstruction or gangrene, not specified as recurrent: Secondary | ICD-10-CM | POA: Diagnosis not present

## 2022-07-09 DIAGNOSIS — I700 Atherosclerosis of aorta: Secondary | ICD-10-CM | POA: Diagnosis not present

## 2022-07-09 DIAGNOSIS — J929 Pleural plaque without asbestos: Secondary | ICD-10-CM | POA: Diagnosis not present

## 2022-07-09 DIAGNOSIS — R079 Chest pain, unspecified: Secondary | ICD-10-CM

## 2022-07-09 DIAGNOSIS — K5730 Diverticulosis of large intestine without perforation or abscess without bleeding: Secondary | ICD-10-CM | POA: Diagnosis not present

## 2022-07-09 DIAGNOSIS — K7689 Other specified diseases of liver: Secondary | ICD-10-CM | POA: Diagnosis not present

## 2022-07-09 LAB — TROPONIN I (HIGH SENSITIVITY)
Troponin I (High Sensitivity): 24 ng/L — ABNORMAL HIGH (ref <18)
Troponin I (High Sensitivity): 30 ng/L — ABNORMAL HIGH (ref <18)

## 2022-07-09 MED ORDER — ALUM & MAG HYDROXIDE-SIMETH 200-200-20 MG/5ML PO SUSP
30.0000 mL | Freq: Once | ORAL | Status: AC
  Administered 2022-07-09: 30 mL via ORAL
  Filled 2022-07-09: qty 30

## 2022-07-09 MED ORDER — IOHEXOL 350 MG/ML SOLN
75.0000 mL | Freq: Once | INTRAVENOUS | Status: AC | PRN
  Administered 2022-07-08: 75 mL via INTRAVENOUS

## 2022-07-09 MED ORDER — PANTOPRAZOLE SODIUM 40 MG IV SOLR
40.0000 mg | Freq: Once | INTRAVENOUS | Status: AC
  Administered 2022-07-09: 40 mg via INTRAVENOUS
  Filled 2022-07-09: qty 10

## 2022-07-09 MED ORDER — SUCRALFATE 1 G PO TABS: 1.0000 g | ORAL_TABLET | Freq: Three times a day (TID) | ORAL | 0 refills | Status: DC

## 2022-07-09 MED ORDER — OXYCODONE-ACETAMINOPHEN 5-325 MG PO TABS
1.0000 | ORAL_TABLET | Freq: Once | ORAL | Status: AC
  Administered 2022-07-09: 1 via ORAL
  Filled 2022-07-09: qty 1

## 2022-07-09 MED ORDER — PANTOPRAZOLE SODIUM 40 MG PO TBEC: 40.0000 mg | DELAYED_RELEASE_TABLET | Freq: Two times a day (BID) | ORAL | 0 refills | Status: DC

## 2022-07-09 MED ORDER — SUCRALFATE 1 G PO TABS
1.0000 g | ORAL_TABLET | Freq: Once | ORAL | Status: AC
  Administered 2022-07-09: 1 g via ORAL
  Filled 2022-07-09: qty 1

## 2022-07-09 NOTE — ED Provider Notes (Signed)
[facility] EMERGENCY DEPARTMENT Provider Note   CSN: [id number] Arrival date & time: 07/08/22  2059     History  Chief Complaint  Patient presents with   Chest Pain    Luis Sweeney is a 81 y.o. male, hx of PUD, CAD, HTN, HLD,  who presents to the ED secondary to epigastric discomfort/chest discomfort radiating to the back for the last 5 days.  Reports that he was recently admitted to the hospital for the same pain, had a negative stress test and negative ultrasound of his gallbladder.  Had relief after receiving nitroglycerin 3 times, and then pain came back about 5 days ago.  Pain has been persistent, and is just severe in nature.  Endorses some nausea, no vomiting.  Pain is not worse with food intake.  States it is stable and severe at all times.  Denies any diaphoresis, fever, chills.  Endorses some right upper quadrant pain, with the pain.  Last BM today.   Chest Pain Associated symptoms: abdominal pain   Associated symptoms: no shortness of breath        Home Medications Prior to Admission medications   Medication Sig Start Date End Date Taking? Authorizing Provider  pantoprazole (PROTONIX) 40 MG tablet Take 1 tablet (40 mg total) by mouth 2 (two) times daily. 07/09/22  Yes Esmee Fallaw L, PA  sucralfate (CARAFATE) 1 g tablet Take 1 tablet (1 g total) by mouth 4 (four) times daily -  with meals and at bedtime. 07/09/22  Yes Larwence Tu L, PA  aspirin EC 81 MG tablet Take 81 mg by mouth daily.    [provider]  celecoxib (CELEBREX) 200 MG capsule  01/19/22   [provider]  Cholecalciferol (VITAMIN D) 125 MCG (5000 UT) CAPS Take 1 capsule by mouth once a week. 02/07/20   Ria Bush, MD  lovastatin (MEVACOR) 40 MG tablet TAKE 1 TABLET BY MOUTH EVERY MONDAY, WEDNESDAY, AND FRIDAY 05/05/22   Ria Bush, MD  meloxicam (MOBIC) 7.5 MG tablet Take 1 tablet (7.5 mg total) by mouth daily. 11/14/21   Marney Setting, NP  ramipril  (ALTACE) 5 MG capsule Take 1 capsule (5 mg total) by mouth 2 (two) times daily. 06/10/22   Sherren Mocha, MD  vitamin B-12 (CYANOCOBALAMIN) 1000 MCG tablet Take 1,000 mcg by mouth daily.    [provider]      Allergies    Prednisone, Codeine, Lipitor [atorvastatin], Morphine, and Simvastatin    Review of Systems   Review of Systems  Respiratory:  Negative for shortness of breath.   Cardiovascular:  Positive for chest pain.  Gastrointestinal:  Positive for abdominal pain.    Physical Exam Updated Vital Signs BP (!) 155/108 (BP Location: Right Arm)   Pulse 70   Temp 98.1 F (36.7 C) (Oral)   Resp 18   SpO2 97%  Physical Exam Constitutional:      Appearance: He is well-developed.  HENT:     Head: Normocephalic.  Eyes:     Extraocular Movements: Extraocular movements intact.     Pupils: Pupils are equal, round, and reactive to light.  Cardiovascular:     Rate and Rhythm: Normal rate and regular rhythm.     Pulses: Normal pulses.  Pulmonary:     Effort: Pulmonary effort is normal.     Breath sounds: Normal breath sounds.  Abdominal:     General: Abdomen is flat. Bowel sounds are normal.     Tenderness: There is  abdominal tenderness in the right upper quadrant. There is guarding.  Musculoskeletal:        General: Normal range of motion.  Skin:    General: Skin is warm and dry.     Capillary Refill: Capillary refill takes less than 2 seconds.  Neurological:     General: No focal deficit present.     Mental Status: He is alert and oriented to person, place, and time.  Psychiatric:        Mood and Affect: Mood normal.     ED Results / Procedures / Treatments   Labs (all labs ordered are listed, but only abnormal results are displayed) Labs Reviewed  COMPREHENSIVE METABOLIC PANEL - Abnormal; Notable for the following components:      Result Value   Potassium 5.2 (*)    Glucose, Bld 111 (*)    BUN 36 (*)    Creatinine, Ser 1.98 (*)    Alkaline  Phosphatase 29 (*)    GFR, Estimated 33 (*)    All other components within normal limits  TROPONIN I (HIGH SENSITIVITY) - Abnormal; Notable for the following components:   Troponin I (High Sensitivity) 24 (*)    All other components within normal limits  TROPONIN I (HIGH SENSITIVITY) - Abnormal; Notable for the following components:   Troponin I (High Sensitivity) 30 (*)    All other components within normal limits  CBC WITH DIFFERENTIAL/PLATELET  PROTIME-INR  TYPE AND SCREEN  TROPONIN I (HIGH SENSITIVITY)    EKG EKG Interpretation  Date/Time:  Thursday July 09 2022 10:59:18 EDT Ventricular Rate:  70 PR Interval:  168 QRS Duration: 84 QT Interval:  386 QTC Calculation: 416 R Axis:   16 Text Interpretation: Normal sinus rhythm Normal ECG When compared with ECG of 08-Jul-2022 21:19, No PVCs present Confirmed by Margaretmary Eddy [phone]) on 07/09/2022 12:26:23 PM  Radiology CT Angio Chest/Abd/Pel for Dissection W and/or Wo Contrast  Result Date: 07/09/2022 CLINICAL DATA:  Chest pain or back pain, aortic dissection suspected. Right chest pain radiating to the back. EXAM: CT ANGIOGRAPHY CHEST, ABDOMEN AND PELVIS TECHNIQUE: Non-contrast CT of the chest was initially obtained. Multidetector CT imaging through the chest, abdomen and pelvis was performed using the standard protocol during bolus administration of intravenous contrast. Multiplanar reconstructed images and MIPs were obtained and reviewed to evaluate the vascular anatomy. RADIATION DOSE REDUCTION: This exam was performed according to the departmental dose-optimization program which includes automated exposure control, adjustment of the mA and/or kV according to patient size and/or use of iterative reconstruction technique. CONTRAST:  98m OMNIPAQUE IOHEXOL 350 MG/ML SOLN COMPARISON:  None Available. FINDINGS: CTA CHEST FINDINGS Cardiovascular: Extensive coronary artery calcification. Global cardiac size within normal limits. No  pericardial effusion. Central pulmonary arteries are of normal caliber. The thoracic aorta is normal in course and caliber. No intramural hematoma, dissection, or aneurysm. Mediastinum/Nodes: No enlarged mediastinal, hilar, or axillary lymph nodes. Thyroid gland, trachea, and esophagus demonstrate no significant findings. Lungs/Pleura: There is mild diffuse bronchial wall thickening present in keeping with airway inflammation. No superimposed confluent pulmonary infiltrate. No pneumothorax or pleural effusion. Central airways are widely patent. Musculoskeletal: No acute bone abnormality. No lytic or blastic bone lesion. Review of the MIP images confirms the above findings. CTA ABDOMEN AND PELVIS FINDINGS VASCULAR Aorta: Normal caliber aorta without aneurysm, dissection, vasculitis or significant stenosis. Moderate atherosclerotic calcification Celiac: Patent without evidence of aneurysm, dissection, vasculitis or significant stenosis. SMA: Patent without evidence of aneurysm, dissection, vasculitis or significant stenosis. Renals:  Both renal arteries are patent without evidence of aneurysm, dissection, vasculitis, fibromuscular dysplasia or significant stenosis. IMA: Patent without evidence of aneurysm, dissection, vasculitis or significant stenosis. Inflow: Patent without evidence of aneurysm, dissection, vasculitis or significant stenosis. Veins: No obvious venous abnormality within the limitations of this arterial phase study. Review of the MIP images confirms the above findings. NON-VASCULAR Hepatobiliary: Multiple simple cysts are seen within the liver, better assessed on MRI examination of 12/31/2020. No enhancing intrahepatic mass. No intra or extrahepatic biliary ductal dilation. Gallbladder unremarkable. Pancreas: Unremarkable Spleen: Unremarkable Adrenals/Urinary Tract: The kidneys are normal in position. Mild bilateral renal cortical atrophy. The kidneys are otherwise unremarkable. Bladder unremarkable.  Stomach/Bowel: Severe distal transverse, descending and sigmoid colonic diverticulosis. The stomach, Rether Rison bowel, and large bowel are otherwise unremarkable. No evidence of obstruction or focal inflammation. No free intraperitoneal gas or fluid. Lymphatic: No pathologic adenopathy within the abdomen and pelvis. Reproductive: Prostate is unremarkable. Other: [place] Leisinger fat containing bilateral inguinal hernias. Tiny fat containing umbilical hernia. Musculoskeletal: Osseous structures are age-appropriate. No acute bone abnormality. Review of the MIP images confirms the above findings. IMPRESSION: 1. No evidence of thoracoabdominal aortic aneurysm or dissection. 2. Extensive coronary artery calcification. 3. Mild diffuse bronchial wall thickening in keeping with airway inflammation. No superimposed confluent pulmonary infiltrate. 4. Severe distal colonic diverticulosis without superimposed acute inflammatory change. Aortic Atherosclerosis (ICD10-I70.0). Electronically Signed   By: Fidela Salisbury M.D.   On: 07/09/2022 00:36    Procedures Procedures   Medications Ordered in ED Medications  oxyCODONE-acetaminophen (PERCOCET/ROXICET) 5-325 MG per tablet 1 tablet (1 tablet Oral Given 07/08/22 2142)  ondansetron (ZOFRAN-ODT) disintegrating tablet 4 mg (4 mg Oral Given 07/08/22 2143)  iohexol (OMNIPAQUE) 350 MG/ML injection 75 mL (75 mLs Intravenous Contrast Given 07/08/22 2351)  oxyCODONE-acetaminophen (PERCOCET/ROXICET) 5-325 MG per tablet 1 tablet (1 tablet Oral Given 07/09/22 0555)  alum & mag hydroxide-simeth (MAALOX/MYLANTA) 200-200-20 MG/5ML suspension 30 mL (30 mLs Oral Given 07/09/22 0925)  pantoprazole (PROTONIX) injection 40 mg (40 mg Intravenous Given 07/09/22 0925)  sucralfate (CARAFATE) tablet 1 g (1 g Oral Given 07/09/22 0254)    ED Course/ Medical Decision Making/ A&P Clinical Course as of 07/09/22 1237  Thu Jul 09, 2022  0858 CT Angio Chest/Abd/Pel for Dissection W and/or Wo Contrast [BS]  1201  Troponin I (High Sensitivity)(!): 30 [BS]    Clinical Course User Index [BS] Melania Kirks, Si Gaul, PA                           Medical Decision Making Amount and/or Complexity of Data Reviewed Labs:  Decision-making details documented in ED Course. Radiology:  Decision-making details documented in ED Course.  Risk OTC drugs. Prescription drug management.   This patient presents to the ED for concern of chest pain/epigastric pain  Co morbidities that complicate the patient evaluation  CAD, PUD   Additional history obtained:  External records from outside source obtained and reviewed including hospital visit from 9/21-9/22   Lab Tests:  I Ordered, and personally interpreted labs.  The pertinent results include:  mildly elevated troponins of 24, 30   Imaging Studies ordered:  I ordered imaging studies including CTA chest/abd/pelvis which showed no acute findings  Cardiac Monitoring: / EKG:  The patient was maintained on a cardiac monitor.  I personally viewed and interpreted the cardiac monitored which showed an underlying rhythm of: normal sinus rhythm w/PVCs   Consultations Obtained:  I requested consultation with the cardiology,  and discussed lab and imaging findings as well as pertinent plan - they recommend: no further cardiology work-up, recommend possible GI consult   Problem List / ED Course / Critical interventions / Medication management  I ordered medication including carafate, PPI, and GI cocktail  for abdominal pain Reevaluation of the patient after these medicines showed that the patient improved; pain now a 3/10, compared to 10/10 on initial eval I have reviewed the patients home medicines and have made adjustments as needed  Test / Admission - Considered:  Patient is an 81 year old male, history of PUD, CAD, here for chest pain/epigastric pain has been persistent for the last 5 days.  10 out of 10.  He recently underwent a cardiac work-up and  hospitalization at [facility], which was unremarkable.  Stress test was normal.  He had uptrending tropes in the ER so cardiology was consulted, they recommend GI consultation as needed.  His symptoms improved with GI cocktail, Carafate, pantoprazole.  He has an extensive history of PUD, no free air visualized on CTA, no acute abdominal process noted.  Findings suspicious for severe GERD versus PUD, started on Carafate, PPI twice a day.  Instructed to follow-up with GI, PCP.  Return symptoms emphasized.  His presentation along with a previous unremarkable cardiac, gallbladder work-up, and relief with nitroglycerin is consistent with GI etiology.  At this time he does not warrant admission secondary to control of his pain, cleared by cardiology, no bloody stools/melena, stable hgb.  Final Clinical Impression(s) / ED Diagnoses Final diagnoses:  Abdominal pain, epigastric  Chest pain, unspecified type    Rx / DC Orders ED Discharge Orders          Ordered    pantoprazole (PROTONIX) 40 MG tablet  2 times daily        07/09/22 1229    sucralfate (CARAFATE) 1 g tablet  3 times daily with meals & bedtime        07/09/22 1229              Laketra Bowdish, [place], PA 07/09/22 1239    Fransico Meadow, MD 07/10/22 [phone]

## 2022-07-09 NOTE — Discharge Instructions (Addendum)
Please follow-up with your primary care doctor, and call your gastroenterologist and see when you can get your appointment moved up for.  Please take the Carafate 4 times a day, for 10 days, and take pantoprazole twice a day for at least 30 days.  Please follow-up with your primary care doctor and your GI doctor for further continuation of your pantoprazole.  Avoid spicy, acidic, fatty foods.  Take Tums or Maalox as needed.  Return to the ER if you are having worsening symptoms.

## 2022-07-09 NOTE — Consult Note (Signed)
Cardiology Consultation   Patient ID: BLADIMIR AUMAN MRN: [id number]; DOB: December 26, 1940  Admit date: 07/08/2022 Date of Consult: 07/09/2022  PCP:  Ria Bush, [facility] Cardiologist:  Sherren Mocha, MD  Cardiology APP:  Liliane Shi, PA-C       Patient Profile:   FARAH BENISH is a 81 y.o. male with a hx of  CAD s/p prior LAD stenting, HTN, HL (mult statin intolerances), CKD III, ICM (EF 45-50% 04/2021), and anemia, who is being seen 07/09/2022 for the evaluation of chest pain at the request of Dr Philip Aspen.  History of Present Illness:   Mr. Shabazz went to [facility] on 09/21 for the same symptoms. His troponin peak was 51. A stress test had no significant ischemia. HR was in the 50s so no BB, EF 45-50% by echo 0725//2023, 57% on MV. Euvolemic. GI eval suggested, ?HIDA scan.   Pt came to the ER 09/27 with R chest pain, Cards asked to see.   The first time he had the symptoms was in 2017.  He does not remember what made them go away but a cath showed a patent stent with only mild in-stent restenosis and nonobstructive disease.  The chest pain was felt noncardiac.  He had the same symptoms again on 9/21 when he went to [facility].  At that time, nitroglycerin was some help.  He was pain-free when he went home.  The pain started again a couple of days ago and at first would go away.  On Tuesday, it came back and did not resolve.  He does not have nitroglycerin, so did not try that.  He tried Tylenol with minimal effect.  He did not try Tums or Mylanta.  Finally, he could not manage the symptoms and the pain was severe so he came back to the hospital.  In the ER, he got some Zofran, Percocet, Protonix IV, Carafate, and a GI cocktail.  He got almost immediate relief from the symptoms after he got the GI cocktail this morning.  The symptoms have not returned.  The pain was not exertional, not positional and no change with deep inspiration.  He is currently  pain-free   Past Medical History:  Diagnosis Date   Acute duodenal ulcer with hemorrhage but without obstruction    required  bld. transfusion   Anemia, iron deficiency    Arthritis    OA- pt. reports its. minor   CAD (coronary artery disease)    a. a. 2004 PCI/DES LAD; b. 06/2016 Cath: LM nl, LAD patent stent, D1 jailed, 90p, LCX nl, OM1 small, nl, RCA nl->Med Rx; c. 04/2021 MV: EF 46%, apical inf infarct. No ischemia.   CKD (chronic kidney disease), stage III ([place])    COVID-19 virus infection 10/2020   Diverticulosis of colon    Duodenal stricture - recurrent 09/21/2014   Erb's palsy    birth trauma, pt. reports he was 11lbs. + at birth    GERD (gastroesophageal reflux disease)    H/O hiatal hernia    Helicobacter pylori gastritis    recurrent   Hypercholesteremia    Hyperglycemia    Hypertension    Ischemic cardiomyopathy    a. 04/2022 Echo: EF 45-50%, mild antlat HK. Nl RV fxn.   Myocardial infarct (HCC)    NAFLD (nonalcoholic fatty liver disease) 2015   with liver cysts   Paralysis of upper limb (HCC)    R arm- birth injury   Personal history of  colonic polyps    Small bowel obstruction (HCC)    Urticaria    Lat left arm    Past Surgical History:  Procedure Laterality Date   ANTERIOR CERVICAL DECOMP/DISCECTOMY FUSION  03/2013   C3/4, C4/5 HNP (kritzer)   ANTERIOR CERVICAL DECOMP/DISCECTOMY FUSION N/A 03/15/2013   Procedure: ANTERIOR CERVICAL DECOMPRESSION/DISCECTOMY FUSION 2 LEVELS;  Surgeon: Faythe Ghee, MD;  Location: [facility];  Service: Neurosurgery;  Laterality: N/A;  Cervical three-four, cervical four-five anterior cervical diskectomy fusion with a trabecular metal plus plate    CARDIAC CATHETERIZATION  2006   stent placed   CARDIAC CATHETERIZATION N/A 07/08/2016   Procedure: Left Heart Cath and Coronary Angiography;  Surgeon: Sherren Mocha, MD;  Location: [place] [facility];  Service: Cardiovascular;  Laterality: N/A;   CATARACT EXTRACTION W/PHACO Right  11/04/2021   Procedure: CATARACT EXTRACTION PHACO AND INTRAOCULAR LENS PLACEMENT ([place]) RIGHT VIVITY LENS 7.38 00:56.1;  Surgeon: Birder Robson, MD;  Location: [place];  Service: Ophthalmology;  Laterality: Right;   CATARACT EXTRACTION W/PHACO Left 11/18/2021   Procedure: CATARACT EXTRACTION PHACO AND INTRAOCULAR LENS PLACEMENT ([facility]) LEFT VIVITY LENS 7.96 00:47.1;  Surgeon: Birder Robson, MD;  Location: [place];  Service: Ophthalmology;  Laterality: Left;  wants late arrival   COLONOSCOPY  multiple   diverticulosis, int hem, h/o adenomatous polyps [name])   COLONOSCOPY  07/2014   severe diverticulosis, no rpt needed [name])   CORONARY STENT PLACEMENT  2006   drug-eluting   ESOPHAGOGASTRODUODENOSCOPY  11/26/10   mild gastritis, duodenitis, duodenal stricture (Dr. [name])   ESOPHAGOGASTRODUODENOSCOPY  10//2015   duodenal stricture, ?recurrent ulcer [name])   HERNIA REPAIR Right 2006   Dr. Hassell Done   KNEE SURGERY     left   SHOULDER SURGERY     right   spinal cyst removal     WRIST SURGERY     XI ROBOTIC ASSISTED INGUINAL HERNIA REPAIR WITH MESH Right 05/29/2021   Procedure: XI ROBOTIC ASSISTED INGUINAL HERNIA REPAIR WITH MESH;  Surgeon: Herbert Pun, MD;  Location: [facility];  Service: General;  Laterality: Right;     Home Medications:  Prior to Admission medications   Medication Sig Start Date End Date Taking? Authorizing Provider  aspirin EC 81 MG tablet Take 81 mg by mouth daily.    [provider]  celecoxib (CELEBREX) 200 MG capsule  01/19/22   [provider]  Cholecalciferol (VITAMIN D) 125 MCG (5000 UT) CAPS Take 1 capsule by mouth once a week. 02/07/20   Ria Bush, MD  lovastatin (MEVACOR) 40 MG tablet TAKE 1 TABLET BY MOUTH EVERY MONDAY, WEDNESDAY, AND FRIDAY 05/05/22   Ria Bush, MD  meloxicam (MOBIC) 7.5 MG tablet Take 1 tablet (7.5 mg total) by mouth daily. 11/14/21   Marney Setting, NP   pantoprazole (PROTONIX) 40 MG tablet Take 1 tablet (40 mg total) by mouth daily as needed (GERD symptoms). 10/27/21   Ria Bush, MD  ramipril (ALTACE) 5 MG capsule Take 1 capsule (5 mg total) by mouth 2 (two) times daily. 06/10/22   Sherren Mocha, MD  vitamin B-12 (CYANOCOBALAMIN) 1000 MCG tablet Take 1,000 mcg by mouth daily.    [provider]    Inpatient Medications: Scheduled Meds:  Continuous Infusions:  PRN Meds:   Allergies:    Allergies  Allergen Reactions   Prednisone Other (See Comments)    Feels really bad   Codeine Hives and Itching   Lipitor [Atorvastatin] Other (See Comments)  Reaction:  Muscle pain    Morphine Hives and Itching   Simvastatin Other (See Comments)    Reaction:  Muscle pain     Social History:   Social History   Socioeconomic History   Marital status: Married    Spouse name: Not on file   Number of children: 3   Years of education: Not on file   Highest education level: Not on file  Occupational History   Occupation: HT Warden/ranger: [employer]    Comment: metal Fabrication/Stairs/Rails commerial  Tobacco Use   Smoking status: Former    Packs/day: 1.50    Years: 3.00    Total pack years: 4.50    Types: Cigarettes    Quit date: 02/16/1985    Years since quitting: 37.4   Smokeless tobacco: Never  Vaping Use   Vaping Use: Never used  Substance and Sexual Activity   Alcohol use: Yes    Alcohol/week: 0.0 standard drinks of alcohol    Comment: two to three times a year   Drug use: No   Sexual activity: Never    Comment: last few years.  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   Social Determinants of Health    Financial Resource Strain: [employer]  (03/23/2022)   Overall Financial Resource Strain (CARDIA)    Difficulty of Paying Living Expenses: Not hard at all  Food Insecurity: No Food Insecurity (03/23/2022)   Hunger Vital Sign    Worried About Running Out of Food in the Last Year: Never true    [place] in the Last Year: Never true  Transportation Needs: No Transportation Needs (03/23/2022)   PRAPARE - Hydrologist (Medical): No    Lack of Transportation (Non-Medical): No  Physical Activity: Insufficiently Active (03/23/2022)   Exercise Vital Sign    Days of Exercise per Week: 3 days    Minutes of Exercise per Session: 30 min  Stress: No Stress Concern Present (03/23/2022)   [place]    Feeling of Stress : Only a little  Social Connections: Moderately Integrated (03/23/2022)   Social Connection and Isolation Panel [NHANES]    Frequency of Communication with Friends and Family: More than three times a week    Frequency of Social Gatherings with Friends and Family: Once a week    Attends Religious Services: More than 4 times per year    Active Member of [employer] or Organizations: No    Attends Archivist Meetings: Never    Marital Status: Married  Human resources officer Violence: Not At Risk (03/23/2022)   Humiliation, Afraid, Rape, and Kick questionnaire    Fear of Current or Ex-Partner: No    Emotionally Abused: No    Physically Abused: No    Sexually Abused: No    Family History:   Family History  Problem Relation Age of Onset   Diabetes Father    Heart disease Father        CHF   Kidney disease Father        kidney failure  Heart failure Father    Diabetes Sister    Hypertension Sister    Cancer Sister        pelvic mass   Hypertension Sister    Arthritis Sister    Ovarian cancer Other      ROS:  Please see the history of present illness.  All other ROS reviewed and  negative.     Physical Exam/Data:   Vitals:   07/08/22 2139 07/08/22 2311 07/09/22 0230 07/09/22 0645  BP: (!) 138/98 130/85 (!) 158/58 (!) 155/108  Pulse: 70 73 68 70  Resp: '18 18 20 18  '$ Temp: 98.5 F (36.9 C) 98.5 F (36.9 C) 97.9 F (36.6 C) 98.1 F (36.7 C)  TempSrc: Oral Oral Oral Oral  SpO2: 97% 95% 96% 97%   No intake or output data in the 24 hours ending 07/09/22 1059    07/01/2022    6:47 PM 03/23/2022    2:05 PM 11/18/2021    8:43 AM  Last 3 Weights  Weight (lbs) 240 lb 240 lb 240 lb 4.8 oz  Weight (kg) 108.863 kg 108.863 kg 109 kg     There is no height or weight on file to calculate BMI.  General:  Well nourished, well developed, in no acute distress HEENT: normal for age Neck: no JVD Vascular: No carotid bruits; Distal pulses 2+ bilaterally Cardiac:  normal S1, S2; RRR; no murmur  Lungs:  clear to auscultation bilaterally, no wheezing, rhonchi or rales  Abd: soft, diffuse tenderness to the right abdomen, no hepatomegaly  Ext: no edema Musculoskeletal:  No deformities, BUE and BLE strength normal and equal Skin: warm and dry  Neuro:  CNs 2-12 intact, no focal abnormalities noted Psych:  Normal affect   EKG:  The EKG was personally reviewed and demonstrates: Sinus rhythm, heart rate 70, no acute ischemic changes.  No change from the ECG 03/26/2021 or the ones during his prior admission Telemetry:  Telemetry was personally reviewed and demonstrates: Sinus rhythm  Relevant CV Studies:  MYOVIEW: 07/02/2022 Narrative & Impression  Pharmacological myocardial perfusion imaging study with no significant  ischemia Normal wall motion, EF estimated at 57% No EKG changes concerning for ischemia at peak stress or in recovery. CT attenuation correction images with moderate coronary calcification noted in the LAD, minimal aortic atherosclerosis Low risk scan   ECHO: 05/05/2022  1. Left ventricular ejection fraction, by estimation, is 45 to 50%. Left  ventricular  ejection fraction by 3D volume is 51 %. The left ventricle has  mildly decreased function. The left ventricle demonstrates regional wall  motion abnormalities (mild anterolateral hypokinesis).   2. Right ventricular systolic function is normal. The right ventricular  size is normal. Tricuspid regurgitation signal is inadequate for assessing  PA pressure.   3. The mitral valve is normal in structure. No evidence of mitral valve  regurgitation. No evidence of mitral stenosis.   4. The aortic valve was not well visualized. Aortic valve regurgitation  is not visualized. No aortic stenosis is present.   CARDIAC CATH: 07/08/2016 1. Widely patent proximal LAD stent with only mild in-stent restenosis 2. Moderate stenosis of a very small OM branch of the circumflex, otherwise widely patent circumflex 3. Angiographically normal right coronary artery   Suspect a noncardiac chest pain. Continue medical therapy for CAD.   Laboratory Data:  High Sensitivity Troponin:   Recent Labs  Lab 07/01/22 1856 07/01/22 2242 07/02/22 0247 07/08/22 2130 07/09/22 0626  TROPONINIHS 21* 39* 51* 10 24*  Chemistry Recent Labs  Lab 07/03/22 0718 07/08/22 2130  NA 139 136  K 4.6 5.2*  CL 109 106  CO2 25 22  GLUCOSE 117* 111*  BUN 32* 36*  CREATININE 1.74* 1.98*  CALCIUM 8.8* 9.4  GFRNONAA 39* 33*  ANIONGAP 5 8    Recent Labs  Lab 07/08/22 2130  PROT 6.7  ALBUMIN 4.0  AST 18  ALT 13  ALKPHOS 29*  BILITOT 0.5   Lipids No results for input(s): "CHOL", "TRIG", "HDL", "LABVLDL", "LDLCALC", "CHOLHDL" in the last 168 hours.  Hematology Recent Labs  Lab 07/03/22 0718 07/08/22 2130  WBC 8.3 10.1  RBC 3.98* 4.65  HGB 12.6* 14.7  HCT 37.6* 44.7  MCV 94.5 96.1  MCH 31.7 31.6  MCHC 33.5 32.9  RDW 14.1 14.4  PLT 196 253   Thyroid No results for input(s): "TSH", "FREET4" in the last 168 hours.  BNPNo results for input(s): "BNP", "PROBNP" in the last 168 hours.  DDimer No results for  input(s): "DDIMER" in the last 168 hours.   Radiology/Studies:  CT Angio Chest/Abd/Pel for Dissection W and/or Wo Contrast  Result Date: 07/09/2022 CLINICAL DATA:  Chest pain or back pain, aortic dissection suspected. Right chest pain radiating to the back. EXAM: CT ANGIOGRAPHY CHEST, ABDOMEN AND PELVIS TECHNIQUE: Non-contrast CT of the chest was initially obtained. Multidetector CT imaging through the chest, abdomen and pelvis was performed using the standard protocol during bolus administration of intravenous contrast. Multiplanar reconstructed images and MIPs were obtained and reviewed to evaluate the vascular anatomy. RADIATION DOSE REDUCTION: This exam was performed according to the departmental dose-optimization program which includes automated exposure control, adjustment of the mA and/or kV according to patient size and/or use of iterative reconstruction technique. CONTRAST:  55m OMNIPAQUE IOHEXOL 350 MG/ML SOLN COMPARISON:  None Available. FINDINGS: CTA CHEST FINDINGS Cardiovascular: Extensive coronary artery calcification. Global cardiac size within normal limits. No pericardial effusion. Central pulmonary arteries are of normal caliber. The thoracic aorta is normal in course and caliber. No intramural hematoma, dissection, or aneurysm. Mediastinum/Nodes: No enlarged mediastinal, hilar, or axillary lymph nodes. Thyroid gland, trachea, and esophagus demonstrate no significant findings. Lungs/Pleura: There is mild diffuse bronchial wall thickening present in keeping with airway inflammation. No superimposed confluent pulmonary infiltrate. No pneumothorax or pleural effusion. Central airways are widely patent. Musculoskeletal: No acute bone abnormality. No lytic or blastic bone lesion. Review of the MIP images confirms the above findings. CTA ABDOMEN AND PELVIS FINDINGS VASCULAR Aorta: Normal caliber aorta without aneurysm, dissection, vasculitis or significant stenosis. Moderate atherosclerotic  calcification Celiac: Patent without evidence of aneurysm, dissection, vasculitis or significant stenosis. SMA: Patent without evidence of aneurysm, dissection, vasculitis or significant stenosis. Renals: Both renal arteries are patent without evidence of aneurysm, dissection, vasculitis, fibromuscular dysplasia or significant stenosis. IMA: Patent without evidence of aneurysm, dissection, vasculitis or significant stenosis. Inflow: Patent without evidence of aneurysm, dissection, vasculitis or significant stenosis. Veins: No obvious venous abnormality within the limitations of this arterial phase study. Review of the MIP images confirms the above findings. NON-VASCULAR Hepatobiliary: Multiple simple cysts are seen within the liver, better assessed on MRI examination of 12/31/2020. No enhancing intrahepatic mass. No intra or extrahepatic biliary ductal dilation. Gallbladder unremarkable. Pancreas: Unremarkable Spleen: Unremarkable Adrenals/Urinary Tract: The kidneys are normal in position. Mild bilateral renal cortical atrophy. The kidneys are otherwise unremarkable. Bladder unremarkable. Stomach/Bowel: Severe distal transverse, descending and sigmoid colonic diverticulosis. The stomach, small bowel, and large bowel are otherwise unremarkable. No evidence of obstruction or  focal inflammation. No free intraperitoneal gas or fluid. Lymphatic: No pathologic adenopathy within the abdomen and pelvis. Reproductive: Prostate is unremarkable. Other: Small fat containing bilateral inguinal hernias. Tiny fat containing umbilical hernia. Musculoskeletal: Osseous structures are age-appropriate. No acute bone abnormality. Review of the MIP images confirms the above findings. IMPRESSION: 1. No evidence of thoracoabdominal aortic aneurysm or dissection. 2. Extensive coronary artery calcification. 3. Mild diffuse bronchial wall thickening in keeping with airway inflammation. No superimposed confluent pulmonary infiltrate. 4. Severe  distal colonic diverticulosis without superimposed acute inflammatory change. Aortic Atherosclerosis (ICD10-I70.0). Electronically Signed   By: Fidela Salisbury M.D.   On: 07/09/2022 00:36     Assessment and Plan:   Chest pain: -The pain starts at the lower edge of the sternum and runs around to the right along the lower edge of his ribs and upper abdomen. -He does not change with exertion or deep inspiration, it is not positional, it is not clearly related to meals -During his previous admission, he got some relief with the first nitroglycerin, but the pain came back requiring 2 more nitros to finally relieve it -The pain this admission feels exactly the same as the previous admission, but no nitro was tried -However, he got complete relief with a GI cocktail -With the normal stress test during the last admission and normal echo in July, do not believe any further ischemic testing is indicated -The most likely etiology for the symptoms is GI, he has had ulcers and H. pylori in the past -He called his GI doctor for an appointment, but they were not able to get him in until December, so he has not yet seen them -Consider GI consult    Risk Assessment/Risk Scores:        For questions or updates, please contact [place] [facility] [place] Please consult [email or web address] for contact info under    Signed, Rosaria Ferries, PA-C  07/09/2022 10:59 AM

## 2022-07-10 ENCOUNTER — Telehealth: Payer: Self-pay | Admitting: Internal Medicine

## 2022-07-10 NOTE — Telephone Encounter (Signed)
Patient requesting sooner appointment for ED follow up. He was evaluated in the ED twice in the last couple of weeks for epigastric pain. States he was told to follow up with our office to discuss potential HIDA scan & stool studies for possible bacteria or ulcerations. Follow up scheduled for 07/13/22 at 9:30 am with [place], [place].

## 2022-07-10 NOTE — Telephone Encounter (Signed)
Inbound call from patient stating that he was seen in the ER on 9/27 and they stated that they believe that he may have ulcers in his stomach, or a bacteria and wanted him to be seen sooner than December first with Dr. [name]. Patient is requesting a call back to discuss. Please advise.

## 2022-07-12 ENCOUNTER — Other Ambulatory Visit: Payer: Self-pay | Admitting: Family Medicine

## 2022-07-12 DIAGNOSIS — E538 Deficiency of other specified B group vitamins: Secondary | ICD-10-CM

## 2022-07-12 DIAGNOSIS — N1832 Chronic kidney disease, stage 3b: Secondary | ICD-10-CM

## 2022-07-12 DIAGNOSIS — E1169 Type 2 diabetes mellitus with other specified complication: Secondary | ICD-10-CM

## 2022-07-12 DIAGNOSIS — E1121 Type 2 diabetes mellitus with diabetic nephropathy: Secondary | ICD-10-CM

## 2022-07-13 ENCOUNTER — Ambulatory Visit: Payer: PPO | Admitting: Physician Assistant

## 2022-07-13 ENCOUNTER — Encounter: Payer: Self-pay | Admitting: Physician Assistant

## 2022-07-13 VITALS — BP 124/68 | HR 70 | Ht 73.0 in | Wt 238.0 lb

## 2022-07-13 DIAGNOSIS — Z8711 Personal history of peptic ulcer disease: Secondary | ICD-10-CM

## 2022-07-13 DIAGNOSIS — R1011 Right upper quadrant pain: Secondary | ICD-10-CM

## 2022-07-13 MED ORDER — HYOSCYAMINE SULFATE 0.125 MG PO TABS: 0.1250 mg | ORAL_TABLET | Freq: Four times a day (QID) | ORAL | 0 refills | Status: DC | PRN

## 2022-07-13 NOTE — Progress Notes (Signed)
07/13/2022 Luis Sweeney [id number] 01/17/1941  Referring provider: Ria Bush, MD Primary GI doctor: Dr. [name]  ASSESSMENT AND PLAN:   Assessment: 81 y.o. male here for assessment of the following: 1. Right upper quadrant abdominal pain   2. History of peptic ulcer    History of peptic ulcer remote, has been on NSAIDS since August.  2 episodes of epigastric pain radiating to right flank and back with nausea, no vomiting, no fever chills, no GERD, no dysphagia, no melena, no anemia Had CTA unremarkable, stress test negative.   Plan: Continue PPI once daily Stop NSAIDS, consider UGI evaluation but without alarm symptoms will hold off.  Check for H pylori with history of positive per patient.  Will get HIDA with 2 episodes of non cardiac chest pain with radiation to right/back   Orders Placed This Encounter  Procedures   NM Hepato W/EF   Miscellaneous test (send-out)    Meds ordered this encounter  Medications   hyoscyamine (LEVSIN) 0.125 MG tablet    Sig: Take 1 tablet (0.125 mg total) by mouth every 6 (six) hours as needed for cramping (AB pain).    Dispense:  10 tablet    Refill:  0    History of Present Illness:  81 y.o. male  with a past medical history of coronary artery disease, status post MI, NAFLD, chronic kidney disease stage III, diabetes mellitus 2, osteoarthritis, he has prior history of peptic ulcer disease in 2008 which had been complicated by duodenal stricture, recurrent small bowel obstruction presumably adhesions and others listed below, returns to clinic today for evaluation of ER visit on 07/08/2022 for chest pain radiating to his back.  Patient had unremarkable CTA..  Labs show unremarkable troponin 24 and 30, EKG unremarkable, CBC without anemia or infection, potassium very slightly elevated at 5.2, BUN 36, creatinine 1.98, alk phos 29, normal AST and ALT, patient given Carafate and Protonix.  07/02/2022 stress test unremarkable 07/08/2022  ER visit for chest pain/back pain, CTA showed unremarkable esophagus, extensive coronary artery calcifications, multiple simple cyst in the liver, no masses, no ductal dilatation, gallbladder unremarkable, pancreas and spleen unremarkable.  Severe distal transverse descending sigmoid diverticulosis without any signs of obstruction.  Small fat-containing bilateral inguinal hernias.  Tiny fat-containing umbilical hernia. [id number]/[id number] patient called scheduled to discuss HIDA or stool studies for bacteria for ulcerations.  Patient states he had ER visit 09/21 for chest pain and again 09/27 for same pain. He states they ruled out a lot of things.  He was having epigastric pain that radiated around his right chest to his back.  Non exertional, states small amount on Monday then worse Tuesday. Not worse with food or exertion.  He had slight nausea associated but no vomiting.  Denies fever, chills. Denies SOB. Denies GERD, rare issues with large pills but no issues with dysphagia.  NTG did help the chest pain.  He is passing gas, no change in bowel habits, has his normal constipation, has BM every 2-3 days but no straining. Denies melena, hematochezia.  Has been on pantoprazole and carafate since the hospital and he has not had the pain again.  Denies weight loss. He has been on mobic 7.82m in the summer for bulging disk, was taking daily but then stopped 3-4 weeks ago, he has been on celebrex once a day for 6-8 months.  Denies any ETOH.   Wt Readings from Last 3 Encounters:  07/13/22 238 lb (108 kg)  07/01/22 240 lb (108.9  kg)  03/23/22 240 lb (108.9 kg)   EGD and colonoscopy in 2015, at EGD noted to have a friable stricture in the second portion of the duodenum which was not traversable and this was suspected to be secondary to remote ulcer.   Colonoscopy at that same time was negative other than severe diverticulosis in the sigmoid colon  He  reports that he quit smoking about 37 years ago. His  smoking use included cigarettes. He has a 4.50 pack-year smoking history. He has never used smokeless tobacco. He reports current alcohol use. He reports that he does not use drugs. His family history includes Arthritis in his sister; Cancer in his sister; Diabetes in his father and sister; Heart disease in his father; Heart failure in his father; Hypertension in his sister and sister; Kidney disease in his father; Ovarian cancer in an other family member.   Current Medications:    Current Outpatient Medications (Cardiovascular):    lovastatin (MEVACOR) 40 MG tablet, TAKE 1 TABLET BY MOUTH EVERY MONDAY, WEDNESDAY, AND FRIDAY   ramipril (ALTACE) 5 MG capsule, Take 1 capsule (5 mg total) by mouth 2 (two) times daily.   Current Outpatient Medications (Analgesics):    aspirin EC 81 MG tablet, Take 81 mg by mouth daily.   celecoxib (CELEBREX) 200 MG capsule,    meloxicam (MOBIC) 7.5 MG tablet, Take 1 tablet (7.5 mg total) by mouth daily.  Current Outpatient Medications (Hematological):    vitamin B-12 (CYANOCOBALAMIN) 1000 MCG tablet, Take 1,000 mcg by mouth daily.  Current Outpatient Medications (Other):    Cholecalciferol (VITAMIN D) 125 MCG (5000 UT) CAPS, Take 1 capsule by mouth once a week.   hyoscyamine (LEVSIN) 0.125 MG tablet, Take 1 tablet (0.125 mg total) by mouth every 6 (six) hours as needed for cramping (AB pain).   pantoprazole (PROTONIX) 40 MG tablet, Take 1 tablet (40 mg total) by mouth 2 (two) times daily.   sucralfate (CARAFATE) 1 g tablet, Take 1 tablet (1 g total) by mouth 4 (four) times daily -  with meals and at bedtime.  Surgical History:  He  has a past surgical history that includes Shoulder surgery; Wrist surgery; spinal cyst removal; Knee surgery; Coronary stent placement (2006); Hernia repair (Right, 2006); Esophagogastroduodenoscopy (11/26/10); Colonoscopy (multiple); Cardiac catheterization (2006); Anterior cervical decomp/discectomy fusion (03/2013); Anterior  cervical decomp/discectomy fusion (N/A, 03/15/2013); Esophagogastroduodenoscopy (10//2015); Colonoscopy (07/2014); Cardiac catheterization (N/A, 07/08/2016); XI Robotic assisted inguinal hernia repair with mesh (Right, 05/29/2021); Cataract extraction w/PHACO (Right, 11/04/2021); and Cataract extraction w/PHACO (Left, 11/18/2021).  Current Medications, Allergies, Past Medical History, Past Surgical History, Family History and Social History were reviewed in [employer] record.  Physical Exam: BP 124/68   Pulse 70   Ht '6\' 1"'  (1.854 m)   Wt 238 lb (108 kg)   BMI 31.40 kg/m  General:   Pleasant, well developed male in no acute distress Heart : Regular rate and rhythm; no murmurs Pulm: Clear anteriorly; no wheezing Abdomen:  Soft, Obese AB, Active bowel sounds. No tenderness . Without guarding and Without rebound, No organomegaly appreciated. Rectal: Not evaluated Extremities:  without  edema. Neurologic:  Alert and  oriented x4;  No focal deficits.  Psych:  Cooperative. Normal mood and affect.   Vladimir Crofts, PA-C 07/13/22

## 2022-07-13 NOTE — Patient Instructions (Signed)
Sending a medication called Carafate, this mechanically coats your stomach. Can cause constipation and darker stools. Take about 30 mins to 1 hour before food and before bed.  If the pill is too large to take, can dissolve it in water and a small orange juice glass or shot glass and take it more as a liquid.  Please take your proton pump inhibitor medication, take pantoprazole once a day  Please take this medication 30 minutes to 1 hour before meals- this makes it more effective.  Avoid spicy and acidic foods Avoid fatty foods Limit your intake of coffee, tea, alcohol, and carbonated drinks Work to maintain a healthy weight Keep the head of the bed elevated at least 3 inches with blocks or a wedge pillow if you are having any nighttime symptoms Stay upright for 2 hours after eating Avoid meals and snacks three to four hours before bedtime  No aleve, ibuprofen, goody powders, celebrex, mobic, as these are antiinflammatories and can cause inflammation in your stomach, increase bleeding risk and cause ulcers.  You can talk with PCP about alternative pain options.  Can do tyelnol max 3000 mg a day, salon pas patches are over the counter and voltern gel is topical antiinflammatory that is safe.   Go to the ER if you have any yellowing of the skin/eyes, dark urine, severe AB pain, fever, chills, nausea or vomiting.   You have been scheduled for a HIDA scan at [facility] (1st floor) on Wednesday 07/29/22. Please arrive 15 minutes prior to your scheduled appointment at  8 am. Make certain not to have anything to eat or drink at least 6 hours prior to your test. Should this appointment date or time not work well for you, please call radiology scheduling at [phone].  _____________________________________________________________________ hepatobiliary (HIDA) scan is an imaging procedure used to diagnose problems in the liver, gallbladder and bile ducts. In the HIDA scan, a radioactive  chemical or tracer is injected into a vein in your arm. The tracer is handled by the liver like bile. Bile is a fluid produced and excreted by your liver that helps your digestive system break down fats in the foods you eat. Bile is stored in your gallbladder and the gallbladder releases the bile when you eat a meal. A special nuclear medicine scanner (gamma camera) tracks the flow of the tracer from your liver into your gallbladder and small intestine.  During your HIDA scan  You'll be asked to change into a hospital gown before your HIDA scan begins. Your health care team will position you on a table, usually on your back. The radioactive tracer is then injected into a vein in your arm.The tracer travels through your bloodstream to your liver, where it's taken up by the bile-producing cells. The radioactive tracer travels with the bile from your liver into your gallbladder and through your bile ducts to your small intestine.You may feel some pressure while the radioactive tracer is injected into your vein. As you lie on the table, a special gamma camera is positioned over your abdomen taking pictures of the tracer as it moves through your body. The gamma camera takes pictures continually for about an hour. You'll need to keep still during the HIDA scan. This can become uncomfortable, but you may find that you can lessen the discomfort by taking deep breaths and thinking about other things. Tell your health care team if you're uncomfortable. The radiologist will watch on a computer the progress of the radioactive tracer  through your body. The HIDA scan may be stopped when the radioactive tracer is seen in the gallbladder and enters your small intestine. This typically takes about an hour. In some cases extra imaging will be performed if original images aren't satisfactory, if morphine is given to help visualize the gallbladder or if the medication CCK is given to look at the contraction of the gallbladder. This  test typically takes 2 hours to complete. ________________________________________________________________________

## 2022-07-14 ENCOUNTER — Other Ambulatory Visit: Payer: PPO

## 2022-07-15 ENCOUNTER — Other Ambulatory Visit (INDEPENDENT_AMBULATORY_CARE_PROVIDER_SITE_OTHER): Payer: PPO

## 2022-07-15 DIAGNOSIS — E1169 Type 2 diabetes mellitus with other specified complication: Secondary | ICD-10-CM | POA: Diagnosis not present

## 2022-07-15 DIAGNOSIS — E538 Deficiency of other specified B group vitamins: Secondary | ICD-10-CM | POA: Diagnosis not present

## 2022-07-15 DIAGNOSIS — N1832 Chronic kidney disease, stage 3b: Secondary | ICD-10-CM

## 2022-07-15 DIAGNOSIS — E785 Hyperlipidemia, unspecified: Secondary | ICD-10-CM | POA: Diagnosis not present

## 2022-07-15 DIAGNOSIS — E1121 Type 2 diabetes mellitus with diabetic nephropathy: Secondary | ICD-10-CM

## 2022-07-15 LAB — COMPREHENSIVE METABOLIC PANEL
ALT: 12 U/L (ref 0–53)
AST: 12 U/L (ref 0–37)
Albumin: 4 g/dL (ref 3.5–5.2)
Alkaline Phosphatase: 28 U/L — ABNORMAL LOW (ref 39–117)
BUN: 39 mg/dL — ABNORMAL HIGH (ref 6–23)
CO2: 27 mEq/L (ref 19–32)
Calcium: 9.1 mg/dL (ref 8.4–10.5)
Chloride: 104 mEq/L (ref 96–112)
Creatinine, Ser: 2.15 mg/dL — ABNORMAL HIGH (ref 0.40–1.50)
GFR: 28.13 mL/min — ABNORMAL LOW (ref >60.00)
Glucose, Bld: 100 mg/dL — ABNORMAL HIGH (ref 70–99)
Potassium: 5.2 mEq/L — ABNORMAL HIGH (ref 3.5–5.1)
Sodium: 137 mEq/L (ref 135–145)
Total Bilirubin: 0.8 mg/dL (ref 0.2–1.2)
Total Protein: 6.1 g/dL (ref 6.0–8.3)

## 2022-07-15 LAB — LIPID PANEL
Cholesterol: 127 mg/dL (ref 0–200)
HDL: 45.1 mg/dL (ref >39.00)
LDL Cholesterol: 73 mg/dL (ref 0–99)
NonHDL: 81.73
Total CHOL/HDL Ratio: 3
Triglycerides: 46 mg/dL (ref 0.0–149.0)
VLDL: 9.2 mg/dL (ref 0.0–40.0)

## 2022-07-15 LAB — MICROALBUMIN / CREATININE URINE RATIO
Creatinine,U: 99.3 mg/dL
Microalb Creat Ratio: 0.7 mg/g (ref 0.0–30.0)
Microalb, Ur: <0.7 mg/dL (ref 0.0–1.9)

## 2022-07-15 LAB — VITAMIN D 25 HYDROXY (VIT D DEFICIENCY, FRACTURES): VITD: 54.61 ng/mL (ref 30.00–100.00)

## 2022-07-15 LAB — VITAMIN B12: Vitamin B-12: 561 pg/mL (ref 211–911)

## 2022-07-20 ENCOUNTER — Telehealth: Payer: Self-pay | Admitting: Physician Assistant

## 2022-07-20 NOTE — Telephone Encounter (Signed)
[facility] H pylori stool negative

## 2022-07-20 NOTE — Telephone Encounter (Signed)
Spoke with patient regarding results. No further questions. 

## 2022-07-22 ENCOUNTER — Encounter: Payer: Self-pay | Admitting: Family Medicine

## 2022-07-22 ENCOUNTER — Ambulatory Visit (INDEPENDENT_AMBULATORY_CARE_PROVIDER_SITE_OTHER): Payer: PPO | Admitting: Family Medicine

## 2022-07-22 VITALS — BP 114/72 | HR 62 | Temp 97.0°F | Ht 73.0 in | Wt 238.0 lb

## 2022-07-22 DIAGNOSIS — I509 Heart failure, unspecified: Secondary | ICD-10-CM

## 2022-07-22 DIAGNOSIS — E1121 Type 2 diabetes mellitus with diabetic nephropathy: Secondary | ICD-10-CM | POA: Diagnosis not present

## 2022-07-22 DIAGNOSIS — K219 Gastro-esophageal reflux disease without esophagitis: Secondary | ICD-10-CM | POA: Diagnosis not present

## 2022-07-22 DIAGNOSIS — E538 Deficiency of other specified B group vitamins: Secondary | ICD-10-CM | POA: Diagnosis not present

## 2022-07-22 DIAGNOSIS — N1832 Chronic kidney disease, stage 3b: Secondary | ICD-10-CM

## 2022-07-22 DIAGNOSIS — I714 Abdominal aortic aneurysm, without rupture, unspecified: Secondary | ICD-10-CM

## 2022-07-22 DIAGNOSIS — Z0001 Encounter for general adult medical examination with abnormal findings: Secondary | ICD-10-CM

## 2022-07-22 DIAGNOSIS — Z8711 Personal history of peptic ulcer disease: Secondary | ICD-10-CM

## 2022-07-22 DIAGNOSIS — I25118 Atherosclerotic heart disease of native coronary artery with other forms of angina pectoris: Secondary | ICD-10-CM

## 2022-07-22 DIAGNOSIS — E785 Hyperlipidemia, unspecified: Secondary | ICD-10-CM

## 2022-07-22 DIAGNOSIS — Z7189 Other specified counseling: Secondary | ICD-10-CM | POA: Diagnosis not present

## 2022-07-22 DIAGNOSIS — E669 Obesity, unspecified: Secondary | ICD-10-CM

## 2022-07-22 DIAGNOSIS — E1169 Type 2 diabetes mellitus with other specified complication: Secondary | ICD-10-CM

## 2022-07-22 NOTE — Assessment & Plan Note (Signed)
Preventative protocols reviewed and updated unless pt declined. Discussed healthy diet and lifestyle.  

## 2022-07-22 NOTE — Progress Notes (Signed)
Patient ID: Luis Sweeney, male    DOB: June 20, 1941, 81 y.o.   MRN: [id number]  This visit was conducted in person.  BP 114/72 (BP Location: Left Arm, Patient Position: Sitting, Cuff Size: Large)   Pulse 62   Temp (!) 97 F (36.1 C)   Ht '6\' 1"'$  (1.854 m)   Wt 238 lb (108 kg)   SpO2 98%   BMI 31.40 kg/m    CC: CPE Subjective:   HPI: Luis Sweeney is a 81 y.o. male presenting on 07/22/2022 for Medicare Wellness (Part 2. Part 1 07-08-22.)   Saw health advisor 03/2022 for medicare wellness visit. Note reviewed.   No results found.  Flowsheet Row Clinical Support from 03/23/2022 in [facility]  PHQ-2 Total Score 0          07/22/2022    2:33 PM 03/23/2022    1:57 PM 03/20/2021    1:25 PM 01/19/2020   11:18 AM 12/12/2018   11:14 AM  Fall Risk   Falls in the past year? 0 0 0 0 0  Number falls in past yr: 0 0 0 0   Injury with Fall? 0 0 0 0   Risk for fall due to : No Fall Risks No Fall Risks Medication side effect Medication side effect   Follow up Falls evaluation completed Falls evaluation completed Falls evaluation completed;Falls prevention discussed Falls evaluation completed;Falls prevention discussed     Several recent ER visits as well as hospitalization for chest and epigastric discomfort. Reassuring cardiac workup including normal stress test (no ischemia, EF 57%, moderate CAC to LAD). Symptoms improved with GI cocktail, carafate, pantoprazole bid. CTA chest unrevealing - did show CAC, diffuse bronchial wall thickening, and severe diverticulosis without infection. Nitroglycerin helped.  Saw GI in follow up last week, note reviewed - H pylori test returned negative, pending HIDA gallbladder emptying study.  Sees Dr [name] next month.  He decided to stop PPI.   Found to have 2.7cm AAA - rec rpt 5 yrs.   Preventative: ESOPHAGOGASTRODUODENOSCOPY Date: 10//2015 duodenal stricture, ?recurrent ulcer [name])  COLONOSCOPY Date: 07/2014 severe  diverticulosis, no rpt needed [name]) Prostate cancer screening - no problems in past. Nocturia x1 per night. Strong stream. Will age out. Lung cancer screening - not eligible  Flu - declines  COVID vaccine - completed Moderna 12/2019  Pneumovax 2004, 2009. Prevnar-13 2016  Td - 2002, 05/2012  Shingrix - discussed, declines  Advanced directives: doesn't have set up. Wouldn't want prolonged life support. Would want oldest daughter Luis Cook RN) to be [facility].  Seat belt use discussed  Sunscreen use discussed. No changing moles on skin. 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 Interim medical history since our last visit reviewed. Allergies and medications reviewed and updated. Outpatient Medications Prior to Visit  Medication Sig Dispense Refill   aspirin EC 81 MG tablet Take 81 mg by mouth daily.     Cholecalciferol (VITAMIN D) 125 MCG (5000 UT) CAPS  Take 1 capsule by mouth once a week.     lovastatin (MEVACOR) 40 MG tablet TAKE 1 TABLET BY MOUTH EVERY MONDAY, WEDNESDAY, AND FRIDAY 40 tablet 0   ramipril (ALTACE) 5 MG capsule Take 1 capsule (5 mg total) by mouth 2 (two) times daily. 180 capsule 0   vitamin B-12 (CYANOCOBALAMIN) 1000 MCG tablet Take 1,000 mcg by mouth daily.     celecoxib (CELEBREX) 200 MG capsule  (Patient not taking: Reported on 07/22/2022)     hyoscyamine (LEVSIN) 0.125 MG tablet Take 1 tablet (0.125 mg total) by mouth every 6 (six) hours as needed for cramping (AB pain). (Patient not taking: Reported on  07/22/2022) 10 tablet 0   meloxicam (MOBIC) 7.5 MG tablet Take 1 tablet (7.5 mg total) by mouth daily. (Patient not taking: Reported on 07/22/2022) 20 tablet 0   pantoprazole (PROTONIX) 40 MG tablet Take 1 tablet (40 mg total) by mouth 2 (two) times daily. (Patient not taking: Reported on 07/22/2022) 60 tablet 0   sucralfate (CARAFATE) 1 g tablet Take 1 tablet (1 g total) by mouth 4 (four) times daily -  with meals and at bedtime. (Patient not taking: Reported on 07/22/2022) 40 tablet 0   No facility-administered medications prior to visit.     Per HPI unless specifically indicated in ROS section below Review of Systems  Constitutional:  Negative for activity change, appetite change, chills, fatigue, fever and unexpected weight change.  HENT:  Negative for hearing loss.   Eyes:  Negative for visual disturbance.  Respiratory:  Positive for chest tightness. Negative for cough, shortness of breath and wheezing.   Cardiovascular:  Negative for chest pain, palpitations and leg swelling.  Gastrointestinal:  Positive for abdominal pain and constipation (chronic, mild). Negative for abdominal distention, blood in stool, diarrhea, nausea and vomiting.  Genitourinary:  Negative for difficulty urinating and hematuria.  Musculoskeletal:  Negative for arthralgias, myalgias and neck pain.  Skin:  Negative for rash.  Neurological:  Negative for dizziness, seizures, syncope and headaches.  Hematological:  Negative for adenopathy. Bruises/bleeds easily.  Psychiatric/Behavioral:  Negative for dysphoric mood. The patient is not nervous/anxious.     Objective:  BP 114/72 (BP Location: Left Arm, Patient Position: Sitting, Cuff Size: Large)   Pulse 62   Temp (!) 97 F (36.1 C)   Ht '6\' 1"'$  (1.854 m)   Wt 238 lb (108 kg)   SpO2 98%   BMI 31.40 kg/m   Wt Readings from Last 3 Encounters:  07/22/22 238 lb (108 kg)  07/13/22 238 lb (108 kg)  07/01/22 240 lb (108.9 kg)      Physical Exam Vitals and  nursing note reviewed.  Constitutional:      General: He is not in acute distress.    Appearance: Normal appearance. He is well-developed. He is not ill-appearing.  HENT:     Head: Normocephalic and atraumatic.     Right Ear: Hearing, tympanic membrane, ear canal and external ear normal.     Left Ear: Hearing, tympanic membrane, ear canal and external ear normal.  Eyes:     General: No scleral icterus.    Extraocular Movements: Extraocular movements intact.     Conjunctiva/sclera: Conjunctivae normal.     Pupils: Pupils are equal, round, and reactive to light.  Neck:     Thyroid: No thyroid mass or thyromegaly.     Vascular: No carotid bruit.  Cardiovascular:     Rate and Rhythm: Normal rate and regular rhythm.  Pulses: Normal pulses.          Radial pulses are 2+ on the right side and 2+ on the left side.     Heart sounds: Normal heart sounds. No murmur heard. Pulmonary:     Effort: Pulmonary effort is normal. No respiratory distress.     Breath sounds: Normal breath sounds. No wheezing, rhonchi or rales.  Abdominal:     General: Bowel sounds are normal. There is no distension.     Palpations: Abdomen is soft. There is no mass.     Tenderness: There is no abdominal tenderness. There is no guarding or rebound.     Hernia: No hernia is present.  Musculoskeletal:        General: Normal range of motion.     Cervical back: Normal range of motion and neck supple.     Right lower leg: No edema.     Left lower leg: No edema.     Comments: R erb's palsy  Lymphadenopathy:     Cervical: No cervical adenopathy.  Skin:    General: Skin is warm and dry.     Findings: No rash.  Neurological:     General: No focal deficit present.     Mental Status: He is alert and oriented to person, place, and time.  Psychiatric:        Mood and Affect: Mood normal.        Behavior: Behavior normal.        Thought Content: Thought content normal.        Judgment: Judgment normal.        Results for orders placed or performed in visit on 07/15/22  Microalbumin / creatinine urine ratio  Result Value Ref Range   Microalb, Ur <0.7 0.0 - 1.9 mg/dL   Creatinine,U 99.3 mg/dL   Microalb Creat Ratio 0.7 0.0 - 30.0 mg/g  VITAMIN D 25 Hydroxy (Vit-D Deficiency, Fractures)  Result Value Ref Range   VITD 54.61 30.00 - 100.00 ng/mL  Vitamin B12  Result Value Ref Range   Vitamin B-12 561 211 - 911 pg/mL  Comprehensive metabolic panel  Result Value Ref Range   Sodium 137 135 - 145 mEq/L   Potassium 5.2 No hemolysis seen (H) 3.5 - 5.1 mEq/L   Chloride 104 96 - 112 mEq/L   CO2 27 19 - 32 mEq/L   Glucose, Bld 100 (H) 70 - 99 mg/dL   BUN 39 (H) 6 - 23 mg/dL   Creatinine, Ser 2.15 (H) 0.40 - 1.50 mg/dL   Total Bilirubin 0.8 0.2 - 1.2 mg/dL   Alkaline Phosphatase 28 (L) 39 - 117 U/L   AST 12 0 - 37 U/L   ALT 12 0 - 53 U/L   Total Protein 6.1 6.0 - 8.3 g/dL   Albumin 4.0 3.5 - 5.2 g/dL   GFR 28.13 (L) >60.00 mL/min   Calcium 9.1 8.4 - 10.5 mg/dL  Lipid panel  Result Value Ref Range   Cholesterol 127 0 - 200 mg/dL   Triglycerides 46.0 0.0 - 149.0 mg/dL   HDL 45.10 >39.00 mg/dL   VLDL 9.2 0.0 - 40.0 mg/dL   LDL Cholesterol 73 0 - 99 mg/dL   Total CHOL/HDL Ratio 3    NonHDL 81.73    Lab Results  Component Value Date   HGBA1C 6.3 (H) 07/01/2022    Assessment & Plan:   Problem List Items Addressed This Visit     Advanced care planning/counseling discussion (Chronic)  Advanced directives: doesn't have set up. Wouldn't want prolonged life support. Would want oldest daughter Luis Cook RN) to be [facility].       Encounter for general adult medical examination with abnormal findings - Primary (Chronic)    Preventative protocols reviewed and updated unless pt declined. Discussed healthy diet and lifestyle.       Hyperlipidemia associated with type 2 diabetes mellitus (HCC)    Chronic, stable on lovastatin '40mg'$  daily.  The ASCVD Risk score (Arnett DK, et al., 2019)  failed to calculate for the following reasons:   The 2019 ASCVD risk score is only valid for ages 48 to 45   The patient has a prior MI or stroke diagnosis       Coronary artery disease of native artery of native heart with stable angina pectoris (HCC)    Continue aspirin, statin.       GERD    He was recently started on PPI but now off this.       Chronic kidney disease, stage 3b (HCC)    Latest GFR down to 28, he stays around 25-35. Reviewed good hydration status, avoiding nephrotoxic agents .      B12 deficiency    Stable period on b12 replacement.       Hx of peptic ulcer   Obesity, Class I, BMI 30-34.9   Type 2 diabetes with nephropathy (HCC)    Recent A1c in prediabetes range off medication.       CHF (congestive heart failure) (HCC)    EF 57% by stress test last month without ischemia      AAA (abdominal aortic aneurysm) ([place])    2.7cm AAA - rec rpt 5 yrs.         No orders of the defined types were placed in this encounter.  No orders of the defined types were placed in this encounter.   Patient instructions: Will await cardiology recommendation about ramipril use.  We will monitor chronic kidney disease. Good to see you today Return in 6 months for follow up visit.   Follow up plan: Return in about 6 months (around 01/21/2023) for follow up visit.  Ria Bush, MD

## 2022-07-22 NOTE — Assessment & Plan Note (Signed)
Advanced directives: doesn't have set up. Wouldn't want prolonged life support. Would want oldest daughter [name] RN) to be [facility].

## 2022-07-22 NOTE — Patient Instructions (Addendum)
Will await cardiology recommendation about ramipril use.  We will monitor chronic kidney disease.  Good to see you today Return in 6 months for follow up visit.   Health Maintenance After Age 81 After age 5, you are at a higher risk for certain long-term diseases and infections as well as injuries from falls. Falls are a major cause of broken bones and head injuries in people who are older than age 9. Getting regular preventive care can help to keep you healthy and well. Preventive care includes getting regular testing and making lifestyle changes as recommended by your health care provider. Talk with your health care provider about: Which screenings and tests you should have. A screening is a test that checks for a disease when you have no symptoms. A diet and exercise plan that is right for you. What should I know about screenings and tests to prevent falls? Screening and testing are the best ways to find a health problem early. Early diagnosis and treatment give you the best chance of managing medical conditions that are common after age 75. Certain conditions and lifestyle choices may make you more likely to have a fall. Your health care provider may recommend: Regular vision checks. Poor vision and conditions such as cataracts can make you more likely to have a fall. If you wear glasses, make sure to get your prescription updated if your vision changes. Medicine review. Work with your health care provider to regularly review all of the medicines you are taking, including over-the-counter medicines. Ask your health care provider about any side effects that may make you more likely to have a fall. Tell your health care provider if any medicines that you take make you feel dizzy or sleepy. Strength and balance checks. Your health care provider may recommend certain tests to check your strength and balance while standing, walking, or changing positions. Foot health exam. Foot pain and numbness, as  well as not wearing proper footwear, can make you more likely to have a fall. Screenings, including: Osteoporosis screening. Osteoporosis is a condition that causes the bones to get weaker and break more easily. Blood pressure screening. Blood pressure changes and medicines to control blood pressure can make you feel dizzy. Depression screening. You may be more likely to have a fall if you have a fear of falling, feel depressed, or feel unable to do activities that you used to do. Alcohol use screening. Using too much alcohol can affect your balance and may make you more likely to have a fall. Follow these instructions at home: Lifestyle Do not drink alcohol if: Your health care provider tells you not to drink. If you drink alcohol: Limit how much you have to: 0-1 drink a day for women. 0-2 drinks a day for men. Know how much alcohol is in your drink. In the [place] one drink equals one 12 oz bottle of beer (355 mL), one 5 oz glass of wine (148 mL), or one 1 oz glass of hard liquor (44 mL). Do not use any products that contain nicotine or tobacco. These products include cigarettes, chewing tobacco, and vaping devices, such as e-cigarettes. If you need help quitting, ask your health care provider. Activity  Follow a regular exercise program to stay fit. This will help you maintain your balance. Ask your health care provider what types of exercise are appropriate for you. If you need a cane or walker, use it as recommended by your health care provider. Wear supportive shoes that have nonskid soles.  Safety  Remove any tripping hazards, such as rugs, cords, and clutter. Install safety equipment such as grab bars in bathrooms and safety rails on stairs. Keep rooms and walkways well-lit. General instructions Talk with your health care provider about your risks for falling. Tell your health care provider if: You fall. Be sure to tell your health care provider about all falls, even ones that seem  minor. You feel dizzy, tiredness (fatigue), or off-balance. Take over-the-counter and prescription medicines only as told by your health care provider. These include supplements. Eat a healthy diet and maintain a healthy weight. A healthy diet includes low-fat dairy products, low-fat (lean) meats, and fiber from whole grains, beans, and lots of fruits and vegetables. Stay current with your vaccines. Schedule regular health, dental, and eye exams. Summary Having a healthy lifestyle and getting preventive care can help to protect your health and wellness after age 52. Screening and testing are the best way to find a health problem early and help you avoid having a fall. Early diagnosis and treatment give you the best chance for managing medical conditions that are more common for people who are older than age 40. Falls are a major cause of broken bones and head injuries in people who are older than age 27. Take precautions to prevent a fall at home. Work with your health care provider to learn what changes you can make to improve your health and wellness and to prevent falls. This information is not intended to replace advice given to you by your health care provider. Make sure you discuss any questions you have with your health care provider. Document Revised: 02/17/2021 Document Reviewed: 02/17/2021 Elsevier Patient Education  [place].

## 2022-07-23 ENCOUNTER — Telehealth: Payer: Self-pay | Admitting: Family Medicine

## 2022-07-23 MED ORDER — PANTOPRAZOLE SODIUM 40 MG PO TBEC: 40.0000 mg | DELAYED_RELEASE_TABLET | Freq: Every day | ORAL | 3 refills | Status: DC

## 2022-07-23 NOTE — Assessment & Plan Note (Signed)
Recent A1c in prediabetes range off medication.

## 2022-07-23 NOTE — Assessment & Plan Note (Signed)
Continue aspirin, statin.  

## 2022-07-23 NOTE — Telephone Encounter (Signed)
Lvm asking pt to call back.  Need to relay Dr. G's message.  

## 2022-07-23 NOTE — Assessment & Plan Note (Signed)
Chronic, stable on lovastatin '40mg'$  daily.  The ASCVD Risk score (Arnett DK, et al., 2019) failed to calculate for the following reasons:   The 2019 ASCVD risk score is only valid for ages 18 to 24   The patient has a prior MI or stroke diagnosis

## 2022-07-23 NOTE — Telephone Encounter (Signed)
Noted  

## 2022-07-23 NOTE — Telephone Encounter (Signed)
Patient called back in and relayed message below.  

## 2022-07-23 NOTE — Telephone Encounter (Signed)
Reviewing chat, do recommend he restart pantoprazole '40mg'$  daily while he sees Dr [name] and probably afterwards as well.

## 2022-07-23 NOTE — Assessment & Plan Note (Addendum)
He was recently started on PPI but now off this.

## 2022-07-23 NOTE — Assessment & Plan Note (Addendum)
EF 57% by stress test last month without ischemia

## 2022-07-23 NOTE — Assessment & Plan Note (Signed)
Latest GFR down to 28, he stays around 25-35. Reviewed good hydration status, avoiding nephrotoxic agents .

## 2022-07-23 NOTE — Assessment & Plan Note (Signed)
Stable period on b12 replacement.

## 2022-07-23 NOTE — Assessment & Plan Note (Signed)
2.7cm AAA - rec rpt 5 yrs.

## 2022-07-29 ENCOUNTER — Encounter (HOSPITAL_COMMUNITY)
Admission: RE | Admit: 2022-07-29 | Discharge: 2022-07-29 | Disposition: A | Discharge status: Home / Self Care | Payer: PPO | Source: Ambulatory Visit | Attending: Physician Assistant | Admitting: Physician Assistant

## 2022-07-29 DIAGNOSIS — R1011 Right upper quadrant pain: Secondary | ICD-10-CM | POA: Diagnosis not present

## 2022-07-29 MED ORDER — TECHNETIUM TC 99M MEBROFENIN IV KIT
5.4000 | PACK | Freq: Once | INTRAVENOUS | Status: AC
  Administered 2022-07-29: 5.4 via INTRAVENOUS

## 2022-08-07 ENCOUNTER — Ambulatory Visit: Payer: PPO | Admitting: Cardiovascular Disease

## 2022-08-12 ENCOUNTER — Telehealth: Payer: Self-pay | Admitting: Family Medicine

## 2022-08-12 ENCOUNTER — Ambulatory Visit: Payer: PPO | Attending: Cardiovascular Disease | Admitting: Cardiovascular Disease

## 2022-08-12 ENCOUNTER — Encounter: Payer: Self-pay | Admitting: Cardiovascular Disease

## 2022-08-12 VITALS — BP 130/70 | HR 72 | Ht 74.0 in | Wt 243.4 lb

## 2022-08-12 DIAGNOSIS — N1832 Chronic kidney disease, stage 3b: Secondary | ICD-10-CM | POA: Diagnosis not present

## 2022-08-12 DIAGNOSIS — Z79899 Other long term (current) drug therapy: Secondary | ICD-10-CM | POA: Diagnosis not present

## 2022-08-12 DIAGNOSIS — I2510 Atherosclerotic heart disease of native coronary artery without angina pectoris: Secondary | ICD-10-CM | POA: Diagnosis not present

## 2022-08-12 DIAGNOSIS — I10 Essential (primary) hypertension: Secondary | ICD-10-CM | POA: Diagnosis not present

## 2022-08-12 NOTE — Patient Instructions (Signed)
Medication Instructions:  STOP Ramipril *If you need a refill on your cardiac medications before your next appointment, please call your pharmacy*   Lab Work: BMET in 1 month (at PCP) If you have labs (blood work) drawn today and your tests are completely normal, you will receive your results only by: [place] (if you have MyChart) OR A paper copy in the mail If you have any lab test that is abnormal or we need to change your treatment, we will call you to review the results.   Testing/Procedures: NONE   Follow-Up: At [facility], you and your health needs are our priority.  As part of our continuing mission to provide you with exceptional heart care, we have created designated Provider Care Teams.  These Care Teams include your primary Cardiologist (physician) and Advanced Practice Providers (APPs -  Physician Assistants and Nurse Practitioners) who all work together to provide you with the care you need, when you need it.  Your next appointment:   1 year(s)  The format for your next appointment:   In Person  Provider:   Sherren Mocha, MD       Important Information About Sugar

## 2022-08-12 NOTE — Progress Notes (Signed)
Cardiology Office Note:    Date:  08/14/2022   ID:  Luis Sweeney, DOB 08-17-1941, MRN [id number]  PCP:  Ria Bush, [place] Providers Cardiologist:  Sherren Mocha, MD Cardiology APP:  Sharmon [place]vere     Referring MD: Ria Bush, MD   Chief Complaint  Patient presents with   Coronary Artery Disease    History of Present Illness:    Luis Sweeney is a 81 y.o. male with a hx of: Coronary artery disease S/p DES to LAD in 2004 Hypertension Chronic kidney disease IIIB NASH Hyperlipidemia PUD  The patient is here alone today. Today, he denies symptoms of palpitations, chest pain, shortness of breath, orthopnea, PND, lower extremity edema, dizziness, or syncope. 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 He is doing well without any cardiac-specific complaints at present. He is concerned about his kidney function and recent labs are reviewed today. We discussed whether he should be continued on ramipril in the setting of continued decline in GFR and recent lab showing mild hyperkalemia.   Past Medical History:  Diagnosis Date   Acute duodenal ulcer with hemorrhage but without obstruction    required  bld. transfusion   Anemia, iron deficiency    Arthritis    OA- pt. reports its. minor   CAD (coronary artery disease)    a. a. 2004 PCI/DES LAD; b. 06/2016 Cath: LM nl, LAD patent stent, D1 jailed, 90p, LCX nl, OM1 small, nl, RCA nl->Med Rx; c. 04/2021 MV: EF 46%, apical inf infarct. No ischemia.   CKD (chronic kidney disease), stage III ([place])    COVID-19 virus infection 10/2020   Diverticulosis of colon    Duodenal stricture - recurrent 09/21/2014   Erb's palsy    birth trauma, pt. reports he was 11lbs. + at birth    GERD (gastroesophageal reflux disease)    H/O hiatal hernia    Helicobacter pylori gastritis    recurrent   Hypercholesteremia    Hyperglycemia    Hypertension    Ischemic cardiomyopathy     a. 04/2022 Echo: EF 45-50%, mild antlat HK. Nl RV fxn.   Myocardial infarct (HCC)    NAFLD (nonalcoholic fatty liver disease) 2015   with liver cysts   Paralysis of upper limb (HCC)    R arm- birth injury   Personal history of colonic polyps    Small bowel obstruction (HCC)    Urticaria    Lat left arm    Past Surgical History:  Procedure Laterality Date   ANTERIOR CERVICAL DECOMP/DISCECTOMY FUSION  03/2013   C3/4, C4/5 HNP (kritzer)   ANTERIOR CERVICAL DECOMP/DISCECTOMY FUSION N/A 03/15/2013   Procedure: ANTERIOR CERVICAL DECOMPRESSION/DISCECTOMY FUSION 2 LEVELS;  Surgeon: Faythe Ghee, MD;  Location: [facility];  Service: Neurosurgery;  Laterality: N/A;  Cervical three-four, cervical four-five anterior cervical diskectomy fusion with a trabecular metal plus plate    CARDIAC CATHETERIZATION  2006   stent placed   CARDIAC CATHETERIZATION N/A 07/08/2016   Procedure: Left Heart Cath and Coronary Angiography;  Surgeon: Sherren Mocha, MD;  Location: [facility];  Service: Cardiovascular;  Laterality: N/A;   CATARACT EXTRACTION W/PHACO Right 11/04/2021   Procedure: CATARACT EXTRACTION PHACO AND INTRAOCULAR LENS PLACEMENT ([place]) RIGHT VIVITY LENS 7.38 00:56.1;  Surgeon: Birder Robson, MD;  Location: [facility];  Service: Ophthalmology;  Laterality: Right;   CATARACT EXTRACTION W/PHACO Left 11/18/2021   Procedure:  CATARACT EXTRACTION PHACO AND INTRAOCULAR LENS PLACEMENT ([facility]) LEFT VIVITY LENS 7.96 00:47.1;  Surgeon: Birder Robson, MD;  Location: [facility];  Service: Ophthalmology;  Laterality: Left;  wants late arrival   COLONOSCOPY  multiple   diverticulosis, int hem, h/o adenomatous polyps [name])   COLONOSCOPY  07/2014   severe diverticulosis, no rpt needed [name])   CORONARY STENT PLACEMENT  2006   drug-eluting   ESOPHAGOGASTRODUODENOSCOPY  11/26/10   mild gastritis, duodenitis, duodenal stricture (Dr. [name])   ESOPHAGOGASTRODUODENOSCOPY  10//2015    duodenal stricture, ?recurrent ulcer [name])   HERNIA REPAIR Right 2006   Dr. Hassell Done   KNEE SURGERY     left   SHOULDER SURGERY     right   spinal cyst removal     WRIST SURGERY     XI ROBOTIC ASSISTED INGUINAL HERNIA REPAIR WITH MESH Right 05/29/2021   Procedure: XI ROBOTIC ASSISTED INGUINAL HERNIA REPAIR WITH MESH;  Surgeon: Herbert Pun, MD;  Location: [facility];  Service: General;  Laterality: Right;    Current Medications: Current Meds  Medication Sig   aspirin EC 81 MG tablet Take 81 mg by mouth daily.   Cholecalciferol (VITAMIN D) 125 MCG (5000 UT) CAPS Take 1 capsule by mouth once a week.   lovastatin (MEVACOR) 40 MG tablet TAKE 1 TABLET BY MOUTH EVERY MONDAY, WEDNESDAY, AND FRIDAY   pantoprazole (PROTONIX) 40 MG tablet Take 1 tablet (40 mg total) by mouth daily.   vitamin B-12 (CYANOCOBALAMIN) 1000 MCG tablet Take 1,000 mcg by mouth daily.   [DISCONTINUED] ramipril (ALTACE) 5 MG capsule Take 1 capsule (5 mg total) by mouth 2 (two) times daily.     Allergies:   Prednisone, Codeine, Lipitor [atorvastatin], Morphine, and Simvastatin   Social History   Socioeconomic History   Marital status: Married    Spouse name: Not on file   Number of children: 3   Years of education: Not on file   Highest education level: Not on file  Occupational History   Occupation: HT Warden/ranger: [employer]    Comment: metal Fabrication/Stairs/Rails commerial  Tobacco Use   Smoking status: Former    Packs/day: 1.50    Years: 3.00    Total pack years: 4.50    Types: Cigarettes    Quit date: 02/16/1985    Years since quitting: 37.5   Smokeless tobacco: Never  Vaping Use   Vaping Use: Never used  Substance and Sexual Activity   Alcohol use: Yes    Alcohol/week: 0.0 standard drinks of alcohol    Comment: two to three times a year   Drug use: No   Sexual activity: Never    Comment: last few years.  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   Social Determinants of Health   Financial Resource Strain: [place]  (03/23/2022)   Overall Financial Resource Strain (CARDIA)    Difficulty of Paying Living Expenses: Not hard at all  Food Insecurity: No Food Insecurity (03/23/2022)   Hunger Vital Sign    Worried About Running Out of Food in the Last Year: Never true    Ran Out  of Food in the Last Year: Never true  Transportation Needs: No Transportation Needs (03/23/2022)   PRAPARE - Hydrologist (Medical): No    Lack of Transportation (Non-Medical): No  Physical Activity: Insufficiently Active (03/23/2022)   Exercise Vital Sign    Days of Exercise per Week: 3 days    Minutes of Exercise per Session: 30 min  Stress: No Stress Concern Present (03/23/2022)   [place]    Feeling of Stress : Only a little  Social Connections: Moderately Integrated (03/23/2022)   Social Connection and Isolation Panel [NHANES]    Frequency of Communication with Friends and Family: More than three times a week    Frequency of Social Gatherings with Friends and Family: Once a week    Attends Religious Services: More than 4 times per year    Active Member of [employer] or Organizations: No    Attends Music therapist: Never    Marital Status: Married     Family History: The patient's family history includes Arthritis in his sister; Cancer in his sister; Diabetes in his father and sister; Heart disease in his father; Heart failure in his father; Hypertension in his sister and sister; Kidney disease in his father; Ovarian cancer in an other family member.  ROS:   Please see the history  of present illness.    All other systems reviewed and are negative.  EKGs/Labs/Other Studies Reviewed:    The following studies were reviewed today: Cardiac Cath 07/08/2016: Conclusion  1. Widely patent proximal LAD stent with only mild in-stent restenosis 2. Moderate stenosis of a very small OM branch of the circumflex, otherwise widely patent circumflex 3. Angiographically normal right coronary artery   Suspect a noncardiac chest pain. Continue medical therapy for CAD.  Myocardial Perfusion Scan 04/17/2021: The left ventricular ejection fraction is mildly decreased (45-54%). Nuclear stress EF: 46%. There was no ST segment deviation noted during stress. Defect 1: There is a small defect of mild severity present in the apical inferior location. Findings consistent with prior myocardial infarction. This is an intermediate risk study.   1.  Fixed apical inferior perfusion defect with wall motion abnormality in this area consistent with prior infarct 2.  Mild systolic dysfunction (EF 16%) 3.  Intermediate risk study due to systolic dysfunction.  No ischemia.  Echo 05/05/2022:  1. Left ventricular ejection fraction, by estimation, is 45 to 50%. Left  ventricular ejection fraction by 3D volume is 51 %. The left ventricle has  mildly decreased function. The left ventricle demonstrates regional wall  motion abnormalities (mild  anterolateral hypokinesis).   2. Right ventricular systolic function is normal. The right ventricular  size is normal. Tricuspid regurgitation signal is inadequate for assessing  PA pressure.   3. The mitral valve is normal in structure. No evidence of mitral valve  regurgitation. No evidence of mitral stenosis.   4. The aortic valve was not well visualized. Aortic valve regurgitation  is not visualized. No aortic stenosis is present.   Comparison(s): No significant change from prior study.   CT Angio Chest/Abd/Pelvis: IMPRESSION: 1. No evidence of  thoracoabdominal aortic aneurysm or dissection. 2. Extensive coronary artery calcification. 3. Mild diffuse bronchial wall thickening in keeping with airway inflammation. No superimposed confluent pulmonary infiltrate. 4. Severe distal colonic diverticulosis without superimposed acute inflammatory change.   Aortic Atherosclerosis (ICD10-I70.0).  EKG:  EKG is not ordered today.    Recent Labs: 07/02/2022:  Magnesium 2.0; TSH 3.158 07/08/2022: Hemoglobin 14.7; Platelets 253 07/15/2022: ALT 12; BUN 39; Creatinine, Ser 2.15; Potassium 5.2 No hemolysis seen; Sodium 137  Recent Lipid Panel    Component Value Date/Time   CHOL 127 07/15/2022 0945   CHOL 111 09/29/2018 1021   TRIG 46.0 07/15/2022 0945   HDL 45.10 07/15/2022 0945   HDL 39 (L) 09/29/2018 1021   CHOLHDL 3 07/15/2022 0945   VLDL 9.2 07/15/2022 0945   LDLCALC 73 07/15/2022 0945   LDLCALC 62 09/29/2018 1021     Risk Assessment/Calculations:            Physical Exam:    VS:  BP 130/70   Pulse 72   Ht '6\' 2"'$  (1.88 m)   Wt 243 lb 6.4 oz (110.4 kg)   SpO2 97%   BMI 31.25 kg/m     Wt Readings from Last 3 Encounters:  08/12/22 243 lb 6.4 oz (110.4 kg)  07/22/22 238 lb (108 kg)  07/13/22 238 lb (108 kg)     GEN:  Well nourished, well developed in no acute distress HEENT: Normal NECK: No JVD; No carotid bruits LYMPHATICS: No lymphadenopathy CARDIAC: RRR, no murmurs, rubs, gallops RESPIRATORY:  Clear to auscultation without rales, wheezing or rhonchi  ABDOMEN: Soft, non-tender, non-distended MUSCULOSKELETAL:  No edema; No deformity  SKIN: Warm and dry NEUROLOGIC:  Alert and oriented x 3 PSYCHIATRIC:  Normal affect   ASSESSMENT:    1. Stage 3b chronic kidney disease ([place])   2. Medication management   3. Coronary artery disease involving native coronary artery of native heart without angina pectoris   4. Essential hypertension    PLAN:    In order of problems listed above:  Labs reviewed and creatinine  increased recently to 2.15, previously around 1.8. discussed adequate fluid hydration, avoidance of nephrotoxic medicines, and plan to hold ramipril x 1 month with a repeat lab that will be drawn by Dr Danise Mina.  As above, hold ramipril. No angina. Continue ASA statin drug.  BP controlled. Will continue to follow off of ramipril an dlikely will need a different antihypertensive agent.            Medication Adjustments/Labs and Tests Ordered: Current medicines are reviewed at length with the patient today.  Concerns regarding medicines are outlined above.  Orders Placed This Encounter  Procedures   Basic metabolic panel   No orders of the defined types were placed in this encounter.   Patient Instructions  Medication Instructions:  STOP Ramipril *If you need a refill on your cardiac medications before your next appointment, please call your pharmacy*   Lab Work: BMET in 1 month (at PCP) If you have labs (blood work) drawn today and your tests are completely normal, you will receive your results only by: [place] (if you have MyChart) OR A paper copy in the mail If you have any lab test that is abnormal or we need to change your treatment, we will call you to review the results.   Testing/Procedures: NONE   Follow-Up: At [facility], you and your health needs are our priority.  As part of our continuing mission to provide you with exceptional heart care, we have created designated Provider Care Teams.  These Care Teams include your primary Cardiologist (physician) and Advanced Practice Providers (APPs -  Physician Assistants and Nurse Practitioners) who all work together to provide you with the care you need, when you need it.  Your next appointment:   1  year(s)  The format for your next appointment:   In Person  Provider:   Sherren Mocha, MD       Important Information About Sugar         Signed, Sherren Mocha, MD  08/14/2022 3:34 PM     [place]

## 2022-08-12 NOTE — Telephone Encounter (Signed)
Received message from Dr Burt Knack - holding ramipril for now with CKD 3b and hyperkalemia. Rec rpt BMP in 1 month. I have ordered. Plz call and schedule pt for lab visit in 1 month.

## 2022-08-13 NOTE — Telephone Encounter (Signed)
Spoke with pt relaying Dr. Synthia Innocent message.  Pt verbalizes understanding and scheduled 1 mo lab visit on 09/14/22 at 11:00.

## 2022-08-14 ENCOUNTER — Encounter: Payer: Self-pay | Admitting: Cardiovascular Disease

## 2022-09-11 ENCOUNTER — Ambulatory Visit: Payer: PPO | Admitting: Internal Medicine

## 2022-09-12 ENCOUNTER — Other Ambulatory Visit: Payer: Self-pay | Admitting: Family Medicine

## 2022-09-14 ENCOUNTER — Other Ambulatory Visit: Payer: PPO

## 2022-09-15 ENCOUNTER — Other Ambulatory Visit (INDEPENDENT_AMBULATORY_CARE_PROVIDER_SITE_OTHER): Payer: PPO

## 2022-09-15 DIAGNOSIS — N1832 Chronic kidney disease, stage 3b: Secondary | ICD-10-CM | POA: Diagnosis not present

## 2022-09-15 LAB — RENAL FUNCTION PANEL
Albumin: 3.9 g/dL (ref 3.5–5.2)
BUN: 24 mg/dL — ABNORMAL HIGH (ref 6–23)
CO2: 29 mEq/L (ref 19–32)
Calcium: 8.7 mg/dL (ref 8.4–10.5)
Chloride: 102 mEq/L (ref 96–112)
Creatinine, Ser: 1.73 mg/dL — ABNORMAL HIGH (ref 0.40–1.50)
GFR: 36.47 mL/min — ABNORMAL LOW (ref >60.00)
Glucose, Bld: 102 mg/dL — ABNORMAL HIGH (ref 70–99)
Phosphorus: 2.6 mg/dL (ref 2.3–4.6)
Potassium: 4.6 mEq/L (ref 3.5–5.1)
Sodium: 137 mEq/L (ref 135–145)

## 2022-09-16 ENCOUNTER — Other Ambulatory Visit: Payer: PPO

## 2022-10-19 ENCOUNTER — Other Ambulatory Visit: Payer: Self-pay | Admitting: Cardiovascular Disease

## 2022-10-28 ENCOUNTER — Telehealth: Payer: Self-pay

## 2022-10-28 NOTE — Progress Notes (Signed)
Care Management & Coordination Services Pharmacy Team  Reason for Encounter: Appointment Reminder  Contacted patient on 10/28/2022    Hospital visits:  Medication Reconciliation was completed by comparing discharge summary, patient's EMR and Pharmacy list, and upon discussion with patient.  Admitted to the  ED on 07/08/22 due to Chest Pain . Discharge date was 07/09/22. Discharged from [facility] ED.    New?Medications Started at [facility] Discharge:?? -started carafate  protonix  Medications that remain the same after Hospital ED  Discharge:??  -All other medications will remain the same.    Medications: Outpatient Encounter Medications as of 10/28/2022  Medication Sig   aspirin EC 81 MG tablet Take 81 mg by mouth daily.   Cholecalciferol (VITAMIN D) 125 MCG (5000 UT) CAPS Take 1 capsule by mouth once a week.   lovastatin (MEVACOR) 40 MG tablet TAKE 1 TABLET BY MOUTH EVERY MONDAY, WEDNESDAY, AND FRIDAY   pantoprazole (PROTONIX) 40 MG tablet Take 1 tablet (40 mg total) by mouth daily.   vitamin B-12 (CYANOCOBALAMIN) 1000 MCG tablet Take 1,000 mcg by mouth daily.   No facility-administered encounter medications on file as of 10/28/2022.   Lab Results  Component Value Date/Time   HGBA1C 6.3 (H) 07/01/2022 06:56 PM   HGBA1C 6.5 01/22/2020 09:30 AM   MICROALBUR <0.7 07/15/2022 09:45 AM   MICROALBUR <0.7 02/20/2016 08:49 AM    BP Readings from Last 3 Encounters:  08/12/22 130/70  07/22/22 114/72  07/13/22 124/68      Patient contacted to confirm telephone appointment with Charlene Brooke,   PharmD, on 11/02/22 at 11:00.  Do you have any problems getting your medications? No  What is your top health concern you would like to discuss at your upcoming visit?  Patient reports feeling well with no complaints  Have you seen any other providers since your last visit with PCP? Yes- cardiology    Star Rating Drugs:  Medication:  Last Fill: Day  Supply Lovastatin '40mg'$  05/05/22 90  (mon.[email or web address].)  Care Gaps: Annual wellness visit in last year? Yes  If Diabetic: Last eye exam / retinopathy screening:2010 Last diabetic foot exam:2018   Charlene Brooke, PharmD notified  Avel Sensor, [place] Assistant [phone]

## 2022-11-02 ENCOUNTER — Ambulatory Visit: Payer: PPO | Admitting: Pharmacist

## 2022-11-02 NOTE — Progress Notes (Signed)
Care Management & Coordination Services Pharmacy Note  11/02/2022 Name:  Luis Sweeney MRN:  [id number] DOB:  1941-09-16  Summary: -HTN: Pt is off ramipril now; he reports SBP at home in 120s (daughter, RN, checks every 1-2 weeks) -DM: A1c 6.3% (06/2022) in prediabetic range, diet-controlled -HLD/CAD: LDL 73 (07/2022) on lovastatin 2-3 x weekly -GERD: pt is not taking pantoprazole daily, uses PRN; he denies further GI issues, he reports these resolved after ED visits/GI workup in Fall 2023   Recommendations/Changes made from today's visit: -No med changes  Follow up plan: -Pharmacist follow up PRN -AWV (LPN) 04/07/02   Subjective: Luis Sweeney is an 82 y.o. year old male who is a primary patient of Ria Bush, MD.  The care coordination team was consulted for assistance with disease management and care coordination needs.    Engaged with patient by telephone for follow up visit.  Recent office visits: 09/15/22 BMP results - Cr 1.75, improved. Stay off ramipril. Need BP update.  07/22/22 Dr Danise Mina OV: annual - d/c celebrex, hyoscyamine, meloxicam, sucralfate, pantoprazole (pt preference). Check w/ cardiology about ramipril use. Advised to restart pantoprazole.  Recent consult visits: 08/12/22 Dr Burt Knack (cardiology): f/u - Cr up to 2.15 from 1.8, hold ramipril x 1 month, repeat labs.  07/13/22 PA Vicie Mutters (GI): abd pain - continue PPI once daily. Stop NSAIDs. Check H Pylori. HIDA. Rx hyoscyamine.  Hospital visits: 07/08/22 ED visit [facility]): abd pain/chest pain. Given GI cocktail, pain improved. Started on carafate, pantoprazole BID. F/u with GI and PCP.  07/02/22 ED visit [facility]) unstable angina. NM stress test -no ischemia. Started aspirin. [place] negative for GB issue.    Objective:  Lab Results  Component Value Date   CREATININE 1.73 (H) 09/15/2022   BUN 24 (H) 09/15/2022   GFR 36.47 (L) 09/15/2022   GFRNONAA 33 (L) 07/08/2022   GFRAA 46 (L) 09/29/2018   NA 137  09/15/2022   K 4.6 09/15/2022   CALCIUM 8.7 09/15/2022   CO2 29 09/15/2022   GLUCOSE 102 (H) 09/15/2022    Lab Results  Component Value Date/Time   HGBA1C 6.3 (H) 07/01/2022 06:56 PM   HGBA1C 6.5 01/22/2020 09:30 AM   GFR 36.47 (L) 09/15/2022 11:43 AM   GFR 28.13 (L) 07/15/2022 09:45 AM   MICROALBUR <0.7 07/15/2022 09:45 AM   MICROALBUR <0.7 02/20/2016 08:49 AM    Last diabetic Eye exam:  Lab Results  Component Value Date/Time   HMDIABEYEEXA Done 03/27/2009 12:00 AM    Last diabetic Foot exam: No results found for: "HMDIABFOOTEX"   Lab Results  Component Value Date   CHOL 127 07/15/2022   HDL 45.10 07/15/2022   LDLCALC 73 07/15/2022   TRIG 46.0 07/15/2022   CHOLHDL 3 07/15/2022       Latest Ref Rng & Units 09/15/2022   11:43 AM 07/15/2022    9:45 AM 07/08/2022    9:30 PM  Hepatic Function  Total Protein 6.0 - 8.3 g/dL  6.1  6.7   Albumin 3.5 - 5.2 g/dL 3.9  4.0  4.0   AST 0 - 37 U/L  12  18   ALT 0 - 53 U/L  12  13   Alk Phosphatase 39 - 117 U/L  28  29   Total Bilirubin 0.2 - 1.2 mg/dL  0.8  0.5     Lab Results  Component Value Date/Time   TSH 3.158 07/02/2022 02:47 AM   TSH 2.10 04/16/2011 08:26 AM   TSH 2.19  01/04/2008 08:47 AM       Latest Ref Rng & Units 07/08/2022    9:30 PM 07/03/2022    7:18 AM 07/01/2022    6:56 PM  CBC  WBC 4.0 - 10.5 K/uL 10.1  8.3  10.5   Hemoglobin 13.0 - 17.0 g/dL 14.7  12.6  13.6   Hematocrit 39.0 - 52.0 % 44.7  37.6  41.6   Platelets 150 - 400 K/uL 253  196  230     Lab Results  Component Value Date/Time   VD25OH 54.61 07/15/2022 09:45 AM   VD25OH 50.34 01/22/2020 09:30 AM   VITAMINB12 561 07/15/2022 09:45 AM   VITAMINB12 426 01/22/2020 09:30 AM    Clinical ASCVD: Yes  The ASCVD Risk score (Arnett DK, et al., 2019) failed to calculate for the following reasons:   The 2019 ASCVD risk score is only valid for ages 98 to 31   The patient has a prior MI or stroke diagnosis       03/23/2022    1:53 PM 03/20/2021     1:25 PM 01/19/2020   11:18 AM  Depression screen PHQ 2/9  Decreased Interest 0 0 0  Down, Depressed, Hopeless 0 0 0  PHQ - 2 Score 0 0 0  Altered sleeping 0 0 0  Tired, decreased energy 0 0 0  Change in appetite 0 0 0  Feeling bad or failure about yourself  0 0 0  Trouble concentrating 0 0 0  Moving slowly or fidgety/restless 0 0 0  Suicidal thoughts 0 0 0  PHQ-9 Score 0 0 0  Difficult doing work/chores Not difficult at all Not difficult at all Not difficult at all     Social History   Tobacco Use  Smoking Status Former   Packs/day: 1.50   Years: 3.00   Total pack years: 4.50   Types: Cigarettes   Quit date: 02/16/1985   Years since quitting: 37.7  Smokeless Tobacco Never   BP Readings from Last 3 Encounters:  08/12/22 130/70  07/22/22 114/72  07/13/22 124/68   Pulse Readings from Last 3 Encounters:  08/12/22 72  07/22/22 62  07/13/22 70   Wt Readings from Last 3 Encounters:  08/12/22 243 lb 6.4 oz (110.4 kg)  07/22/22 238 lb (108 kg)  07/13/22 238 lb (108 kg)   BMI Readings from Last 3 Encounters:  08/12/22 31.25 kg/m  07/22/22 31.40 kg/m  07/13/22 31.40 kg/m    Allergies  Allergen Reactions   Prednisone Other (See Comments)    Feels really bad   Codeine Hives and Itching   Lipitor [Atorvastatin] Other (See Comments)    Reaction:  Muscle pain    Morphine Hives and Itching   Simvastatin Other (See Comments)    Reaction:  Muscle pain     Medications Reviewed Today     Reviewed by Charlton Haws, RPH (Pharmacist) on 11/02/22 at 1127  Med List Status: <None>   Medication Order Taking? Sig Documenting Provider Last Dose Status Informant  aspirin EC 81 MG tablet [id number] Yes Take 81 mg by mouth daily. [provider] Taking Active Self  Cholecalciferol (VITAMIN D) 125 MCG (5000 UT) CAPS [id number] Yes Take 1 capsule by mouth once a week. Ria Bush, MD Taking Active Self           Med Note Jackalyn Lombard Aug 12, 2022  3:04  PM)    lovastatin (MEVACOR) 40 MG tablet [id number] Yes TAKE 1  TABLET BY MOUTH EVERY MONDAY, WEDNESDAY, AND Roby Lofts, MD Taking Active Self  pantoprazole (PROTONIX) 40 MG tablet [id number] No Take 1 tablet (40 mg total) by mouth daily.  Patient not taking: Reported on 11/02/2022   Ria Bush, MD Not Taking Active   vitamin B-12 (CYANOCOBALAMIN) 1000 MCG tablet [id number] Yes Take 1,000 mcg by mouth daily. [provider] Taking Active Self            SDOH:  (Social Determinants of Health) assessments and interventions performed: No SDOH Interventions    Flowsheet Row Clinical Support from 03/23/2022 in Home at [place] from 03/20/2021 in [place] at [place] from 01/19/2020 in [place] at [place] Interventions Intervention Not Indicated -- --  Housing Interventions Intervention Not Indicated -- --  Transportation Interventions Intervention Not Indicated -- --  Depression Interventions/Treatment  -- PHQ2-9 Score <4 Follow-up Not Indicated PHQ2-9 Score <4 Follow-up Not Indicated  Financial Strain Interventions Intervention Not Indicated -- --  Physical Activity Interventions Intervention Not Indicated -- --  Stress Interventions Intervention Not Indicated -- --  Social Connections Interventions Intervention Not Indicated -- --      SDOH Screenings   Food Insecurity: No Food Insecurity (03/23/2022)  Housing: Low Risk  (03/23/2022)  Transportation Needs: No Transportation Needs (03/23/2022)  Alcohol Screen: Low Risk  (03/23/2022)  Depression (PHQ2-9): Low Risk  (03/23/2022)  Financial Resource Strain: Low Risk  (03/23/2022)  Physical Activity: Insufficiently Active (03/23/2022)  Social Connections: Moderately Integrated (03/23/2022)  Stress: No Stress Concern Present (03/23/2022)  Tobacco Use: Medium Risk  (08/14/2022)    Medication Assistance: None required.  Patient affirms current coverage meets needs.  Medication Access: Within the past 30 days, how often has patient missed a dose of medication? 0 Is a pillbox or other method used to improve adherence? No  Factors that may affect medication adherence? no barriers identified Are meds synced by current pharmacy? No  Are meds delivered by current pharmacy? No  Does patient experience delays in picking up medications due to transportation concerns? No   [facility] Services Reviewed: Is patient disadvantaged to use [facility]?: Yes  Current Rx insurance plan: HTA Name and location of Current pharmacy:  [place] [place] [address] [place] [address] Phone: [phone] Fax: [phone]  [facility] services reviewed with patient today?: No  Patient requests to transfer care to [facility] Pharmacy?: No  Reason patient declined to change pharmacies: Disadvantaged due to insurance/mail order  Compliance/Adherence/Medication fill history: Care Gaps: Eye exam, Foot exam - pt is pre-diabetic  Star-Rating Drugs: Lovastatin - PDC 51%; LF 05/05/22 x 90 ds   Assessment/Plan   Hypertension / Heart Failure (BP goal <130/80) -Controlled - off of rampril, BP at home appears at goal -Current home BP readings: SBP 120s; pt is not checking often, his daughter is Therapist, sports and will check when she visits every 1-2 weeks -Denies hypotensive/hypertensive symptoms -Last ejection fraction: 45-50% (Date: 05/05/22) -HF type: HFmrEF (mildly reduced EF 41-49%); Grade 1 diastolic dysfunction -Current treatment: None -Medications previously tried: carvedilol, Ramipril (GFR) -Educated on BP goals for prevention of heart attack, stroke and kidney damage; Daily salt intake goal < 2300 mg; -Counseled to monitor BP at home periodically -Recommended to continue current medication  Hyperlipidemia / CAD  (LDL goal < 70) -Controlled -  LDL 73 (07/2022); -pt  endorses compliance with statin 1-2 times weekly, he was dispensed 40 tablets in July 2023 and still has ~10 left -Hx MI 2004 s/p DES; cath 06/2016 w/ patent stent -Current treatment: Lovastatin 40 mg MWF - Appropriate, Query Effective Aspirin 81 mg daily - Appropriate, Effective, Safe, Accessible -Medications previously tried: rosuvastatin 5, pravastatin, simvastatin, atorvastatin -Educated on Cholesterol goals; Benefits of statin for ASCVD risk reduction; -Recommended to continue current medication  Diabetes (A1c goal <7%) -Diet-controlled; A1c 6.3% (06/2022) in prediabetic range -Educated on A1c and blood sugar goals; -Counseled on diet and exercise extensively  CKD Stage 3b (Goal: slow progression) -Not ideally controlled - pt endorses limited water intake but does drink a lot of diet pepsi -Current treatment  None -Discussed importance of hydration and BP control to prevent progression of DM -Can consider SGLT2 or Car[place] Clamp, pt is not keen on adding new medication especially brand-name -Recommended to continue current medication  GERD (Goal: manage symptoms) -Controlled - pt reports he has not been taking PPI lately; GI symptoms have resolved -Hx SBO 11/2020 and s/p hernia repair 05/2021. -Current treatment  Pantoprazole 40 mg daily PRN - Appropriate, Effective, Safe, Accessible -Reviewed chart - patient was in ED twice in Sept 2023 for chest/epigastric pain, GI workup following was negative for gallbladder issues and H Pylori;  -Recommend to continue current medication  Health Maintenance -Vaccine gaps: Flu, covid booster, Shingrix -Pt declines vaccines -Pt reports "bulging discs" in neck; s/p back injections which were not helpful    Charlene Brooke, PharmD, BCACP Clinical Pharmacist [facility] [phone]

## 2022-11-12 DIAGNOSIS — M7061 Trochanteric bursitis, right hip: Secondary | ICD-10-CM | POA: Diagnosis not present

## 2022-12-18 DIAGNOSIS — M7061 Trochanteric bursitis, right hip: Secondary | ICD-10-CM | POA: Diagnosis not present

## 2023-02-17 ENCOUNTER — Ambulatory Visit (INDEPENDENT_AMBULATORY_CARE_PROVIDER_SITE_OTHER): Payer: PPO | Admitting: Family

## 2023-02-17 ENCOUNTER — Encounter: Payer: Self-pay | Admitting: Family

## 2023-02-17 VITALS — BP 136/76 | HR 92 | Temp 97.9°F | Ht 74.0 in | Wt 237.6 lb

## 2023-02-17 DIAGNOSIS — N3001 Acute cystitis with hematuria: Secondary | ICD-10-CM | POA: Diagnosis not present

## 2023-02-17 DIAGNOSIS — R300 Dysuria: Secondary | ICD-10-CM | POA: Diagnosis not present

## 2023-02-17 LAB — POC URINALSYSI DIPSTICK (AUTOMATED)
Glucose, UA: NEGATIVE
Ketones, UA: NEGATIVE
Nitrite, UA: NEGATIVE
Protein, UA: POSITIVE — AB
Spec Grav, UA: 1.015 (ref 1.010–1.025)
Urobilinogen, UA: 1 E.U./dL
pH, UA: 6 (ref 5.0–8.0)

## 2023-02-17 MED ORDER — CIPROFLOXACIN HCL 500 MG PO TABS: 500.0000 mg | ORAL_TABLET | Freq: Two times a day (BID) | ORAL | 0 refills | Status: AC

## 2023-02-17 NOTE — Assessment & Plan Note (Addendum)
poct urine dip in office Urine culture ordered pending results antbx sent to pharmacy, pt to take as directed. Encouraged increased water intake throughout the day. Choosing to treat due to being symptomatic. If no improvement in the next 2 days pt advised to let me know.  Would recommend two week repeat after treatment of urine to assess for resolution of hematuria  Rx cipro 500 mg po bid x 14 days  Gfr 36 12/24

## 2023-02-17 NOTE — Progress Notes (Signed)
Established Patient Office Visit  Subjective:   Patient ID: Luis Sweeney, male    DOB: 05-16-1941  Age: 82 y.o. MRN: [id number]  CC:  Chief Complaint  Patient presents with   Dysuria    HPI: Luis Sweeney is a 82 y.o. male presenting on 02/17/2023 for Dysuria  Dysuria     Started two days ago with dysuria, urinary urgency, and urinary frequency. Is experiencing some urinary retention. Is drinking water.    No fever or chills.  Some slight mid back pain.        ROS: Negative unless specifically indicated above in HPI.   Relevant past medical history reviewed and updated as indicated.   Allergies and medications reviewed and updated.   Current Outpatient Medications:    aspirin EC 81 MG tablet, Take 81 mg by mouth daily., Disp: , Rfl:    ciprofloxacin (CIPRO) 500 MG tablet, Take 1 tablet (500 mg total) by mouth 2 (two) times daily for 14 days., Disp: 28 tablet, Rfl: 0  Allergies  Allergen Reactions   Prednisone Other (See Comments)    Feels really bad   Codeine Hives and Itching   Lipitor [Atorvastatin] Other (See Comments)    Reaction:  Muscle pain    Morphine Hives and Itching   Simvastatin Other (See Comments)    Reaction:  Muscle pain     Objective:   BP 136/76 (BP Location: Left Arm)   Pulse 92   Temp 97.9 F (36.6 C) (Temporal)   Ht 6\' 2"  (1.88 m)   Wt 237 lb 9.6 oz (107.8 kg)   SpO2 99%   BMI 30.51 kg/m    Physical Exam Constitutional:      General: He is not in acute distress.    Appearance: Normal appearance. He is normal weight. He is not ill-appearing, toxic-appearing or diaphoretic.  Cardiovascular:     Rate and Rhythm: Normal rate.  Pulmonary:     Effort: Pulmonary effort is normal.  Abdominal:     Tenderness: There is no abdominal tenderness. There is no right CVA tenderness or left CVA tenderness.  Musculoskeletal:        General: Normal range of motion.  Neurological:     General: No focal deficit present.     Mental Status:  He is alert and oriented to person, place, and time. Mental status is at baseline.  Psychiatric:        Mood and Affect: Mood normal.        Behavior: Behavior normal.        Thought Content: Thought content normal.        Judgment: Judgment normal.     Assessment & Plan:  Dysuria -     POCT Urinalysis Dipstick (Automated) -     Ciprofloxacin HCl; Take 1 tablet (500 mg total) by mouth 2 (two) times daily for 14 days.  Dispense: 28 tablet; Refill: 0  Acute cystitis with hematuria Assessment & Plan: poct urine dip in office Urine culture ordered pending results antbx sent to pharmacy, pt to take as directed. Encouraged increased water intake throughout the day. Choosing to treat due to being symptomatic. If no improvement in the next 2 days pt advised to let me know.  Would recommend two week repeat after treatment of urine to assess for resolution of hematuria  Rx cipro 500 mg po bid x 14 days  Gfr 36 12/24  Orders: -     Ciprofloxacin HCl; Take 1 tablet (500  mg total) by mouth 2 (two) times daily for 14 days.  Dispense: 28 tablet; Refill: 0 -     Urinalysis w microscopic + reflex cultur     Follow up plan: Return if symptoms worsen or fail to improve f/u with pcp.  Mort Sawyers, FNP

## 2023-02-18 ENCOUNTER — Other Ambulatory Visit: Payer: Self-pay | Admitting: Family

## 2023-02-18 DIAGNOSIS — R81 Glycosuria: Secondary | ICD-10-CM

## 2023-02-18 NOTE — Progress Notes (Signed)
There was a small amount of glucose in the urine and also some protein and white blood cells. Culture is still pending however I would like for him to return in two weeks and repeat urine and get a lab test to rule out reason for glucose in urine (which may also resolve with treatment) was pt taking anything over the counter for urinary symptoms such as azo?

## 2023-02-19 ENCOUNTER — Telehealth: Payer: Self-pay | Admitting: Family Medicine

## 2023-02-19 NOTE — Telephone Encounter (Signed)
Pt returned call to office regarding lab results. 

## 2023-02-19 NOTE — Telephone Encounter (Signed)
See results note. 

## 2023-02-20 LAB — URINE CULTURE
MICRO NUMBER:: 14933530
SPECIMEN QUALITY:: ADEQUATE

## 2023-02-20 LAB — URINALYSIS W MICROSCOPIC + REFLEX CULTURE
Bilirubin Urine: NEGATIVE
Hgb urine dipstick: NEGATIVE
Hyaline Cast: NONE SEEN /LPF
Ketones, ur: NEGATIVE
Nitrites, Initial: NEGATIVE
RBC / HPF: NONE SEEN /HPF (ref 0–2)
Specific Gravity, Urine: 1.022 (ref 1.001–1.035)
Squamous Epithelial / HPF: NONE SEEN /HPF (ref <=5)
pH: >=9 — AB (ref 5.0–8.0)

## 2023-02-20 LAB — CULTURE INDICATED

## 2023-02-22 NOTE — Progress Notes (Signed)
Culture is positive for urinary tract infection. Infection can cause variable abnormalities in the urine, hence why we'll repeat after being two weeks on the antibiotic. I would suggest also scheduling f/u with Dr. Reece Agar (not with me) in two weeks time.

## 2023-03-01 ENCOUNTER — Other Ambulatory Visit: Payer: Self-pay | Admitting: Family

## 2023-03-01 DIAGNOSIS — N3001 Acute cystitis with hematuria: Secondary | ICD-10-CM

## 2023-03-01 DIAGNOSIS — R300 Dysuria: Secondary | ICD-10-CM

## 2023-03-04 ENCOUNTER — Other Ambulatory Visit: Payer: PPO

## 2023-03-05 ENCOUNTER — Encounter: Payer: Self-pay | Admitting: Family Medicine

## 2023-03-05 ENCOUNTER — Ambulatory Visit (INDEPENDENT_AMBULATORY_CARE_PROVIDER_SITE_OTHER): Payer: PPO | Admitting: Family Medicine

## 2023-03-05 VITALS — BP 126/82 | HR 86 | Temp 97.3°F | Ht 74.0 in | Wt 241.1 lb

## 2023-03-05 DIAGNOSIS — R600 Localized edema: Secondary | ICD-10-CM | POA: Diagnosis not present

## 2023-03-05 DIAGNOSIS — N3001 Acute cystitis with hematuria: Secondary | ICD-10-CM

## 2023-03-05 DIAGNOSIS — R81 Glycosuria: Secondary | ICD-10-CM | POA: Diagnosis not present

## 2023-03-05 DIAGNOSIS — E1121 Type 2 diabetes mellitus with diabetic nephropathy: Secondary | ICD-10-CM

## 2023-03-05 DIAGNOSIS — N1832 Chronic kidney disease, stage 3b: Secondary | ICD-10-CM | POA: Diagnosis not present

## 2023-03-05 DIAGNOSIS — E785 Hyperlipidemia, unspecified: Secondary | ICD-10-CM | POA: Diagnosis not present

## 2023-03-05 DIAGNOSIS — E1169 Type 2 diabetes mellitus with other specified complication: Secondary | ICD-10-CM | POA: Diagnosis not present

## 2023-03-05 DIAGNOSIS — E538 Deficiency of other specified B group vitamins: Secondary | ICD-10-CM | POA: Diagnosis not present

## 2023-03-05 LAB — POC URINALSYSI DIPSTICK (AUTOMATED)
Bilirubin, UA: NEGATIVE
Blood, UA: NEGATIVE
Glucose, UA: NEGATIVE
Ketones, UA: NEGATIVE
Nitrite, UA: NEGATIVE
Protein, UA: NEGATIVE
Spec Grav, UA: 1.02 (ref 1.010–1.025)
Urobilinogen, UA: 0.2 E.U./dL
pH, UA: 6 (ref 5.0–8.0)

## 2023-03-05 LAB — RENAL FUNCTION PANEL
Albumin: 3.8 g/dL (ref 3.5–5.2)
BUN: 22 mg/dL (ref 6–23)
CO2: 31 mEq/L (ref 19–32)
Calcium: 8.7 mg/dL (ref 8.4–10.5)
Chloride: 103 mEq/L (ref 96–112)
Creatinine, Ser: 1.66 mg/dL — ABNORMAL HIGH (ref 0.40–1.50)
GFR: 38.2 mL/min — ABNORMAL LOW (ref >60.00)
Glucose, Bld: 101 mg/dL — ABNORMAL HIGH (ref 70–99)
Phosphorus: 2.8 mg/dL (ref 2.3–4.6)
Potassium: 4.6 mEq/L (ref 3.5–5.1)
Sodium: 140 mEq/L (ref 135–145)

## 2023-03-05 LAB — LDL CHOLESTEROL, DIRECT: Direct LDL: 96 mg/dL

## 2023-03-05 LAB — HEMOGLOBIN A1C: Hgb A1c MFr Bld: 6.6 % — ABNORMAL HIGH (ref 4.6–6.5)

## 2023-03-05 LAB — VITAMIN D 25 HYDROXY (VIT D DEFICIENCY, FRACTURES): VITD: 38.6 ng/mL (ref 30.00–100.00)

## 2023-03-05 LAB — VITAMIN B12: Vitamin B-12: 437 pg/mL (ref 211–911)

## 2023-03-05 NOTE — Assessment & Plan Note (Signed)
Update vitamin b12 off replacement.

## 2023-03-05 NOTE — Assessment & Plan Note (Signed)
Update A1c off medication.  

## 2023-03-05 NOTE — Assessment & Plan Note (Signed)
Update LDL off lovastatin.

## 2023-03-05 NOTE — Assessment & Plan Note (Signed)
GFR runs 25-35, on latest check GFR 36.  Update renal panel and vit D off replacement.

## 2023-03-05 NOTE — Progress Notes (Unsigned)
Ph: [phone] Fax: [phone]   Patient ID: Luis Sweeney, male    DOB: [birth year], 82 y.o.   MRN: [id number]  This visit was conducted in person.  BP 126/82   Pulse 86   Temp (!) 97.3 F (36.3 C) (Temporal)   Ht 6\' 2"  (1.88 m)   Wt 241 lb 2 oz (109.4 kg)   SpO2 98%   BMI 30.96 kg/m    CC: UTI Subjective:   HPI: Luis Sweeney is a 82 y.o. male presenting on 03/05/2023 for Urinary Tract Infection (Here for 2 wk UTI f/u, per Tabitha. )   Seen by Mort Sawyers NP earlier this month for dysuria with UTI symptoms. Treated with cipro antibiotic 14d course. UCx grew >100k proteus sensitive to cipro. On send out UA with micro - no blood noted.   He has completed cipro course on Monday.  UTI symptoms are fully resolved.  Notes some recent bilateral leg swelling.  Has ben feeling run down since taking antibiotic.   He previously self tapered off ramipril.  H/o diet controlled T2DM - update A1c today.  He was previously on lovastatin a few days a week, he stopped this.  He's been off pantoprazole without flare of GERD.      Relevant past medical, surgical, family and social history reviewed and updated as indicated. Interim medical history since our last visit reviewed. Allergies and medications reviewed and updated. Outpatient Medications Prior to Visit  Medication Sig Dispense Refill   aspirin EC 81 MG tablet Take 81 mg by mouth daily.     No facility-administered medications prior to visit.     Per HPI unless specifically indicated in ROS section below Review of Systems  Objective:  BP 126/82   Pulse 86   Temp (!) 97.3 F (36.3 C) (Temporal)   Ht 6\' 2"  (1.88 m)   Wt 241 lb 2 oz (109.4 kg)   SpO2 98%   BMI 30.96 kg/m   Wt Readings from Last 3 Encounters:  03/05/23 241 lb 2 oz (109.4 kg)  02/17/23 237 lb 9.6 oz (107.8 kg)  08/12/22 243 lb 6.4 oz (110.4 kg)      Physical Exam Vitals and nursing note reviewed.  Constitutional:      Appearance: Normal  appearance. He is not ill-appearing.  HENT:     Mouth/Throat:     Mouth: Mucous membranes are moist.     Pharynx: Oropharynx is clear. No oropharyngeal exudate or posterior oropharyngeal erythema.  Eyes:     Extraocular Movements: Extraocular movements intact.     Pupils: Pupils are equal, round, and reactive to light.  Cardiovascular:     Rate and Rhythm: Normal rate and regular rhythm.     Pulses: Normal pulses.     Heart sounds: Normal heart sounds. No murmur heard. Pulmonary:     Effort: Pulmonary effort is normal. No respiratory distress.     Breath sounds: Normal breath sounds. No wheezing, rhonchi or rales.  Musculoskeletal:        General: Tenderness (tender swelling) present.     Right lower leg: Edema (1+) present.     Left lower leg: Edema (1+) present.     Comments: No pain to bilateral achilles tendons or insertion  Skin:    General: Skin is warm and dry.     Findings: No rash.  Neurological:     Mental Status: He is alert.  Psychiatric:        Mood and  Affect: Mood normal.        Behavior: Behavior normal.       Results for orders placed or performed in visit on 03/05/23  Renal function panel  Result Value Ref Range   Sodium 140 135 - 145 mEq/L   Potassium 4.6 3.5 - 5.1 mEq/L   Chloride 103 96 - 112 mEq/L   CO2 31 19 - 32 mEq/L   Albumin 3.8 3.5 - 5.2 g/dL   BUN 22 6 - 23 mg/dL   Creatinine, Ser 0.86 (H) 0.40 - 1.50 mg/dL   Glucose, Bld 578 (H) 70 - 99 mg/dL   Phosphorus 2.8 2.3 - 4.6 mg/dL   GFR 46.96 (L) >29.52 mL/min   Calcium 8.7 8.4 - 10.5 mg/dL  Hemoglobin W4X  Result Value Ref Range   Hgb A1c MFr Bld 6.6 (H) 4.6 - 6.5 %  VITAMIN D 25 Hydroxy (Vit-D Deficiency, Fractures)  Result Value Ref Range   VITD 38.60 30.00 - 100.00 ng/mL  Vitamin B12  Result Value Ref Range   Vitamin B-12 437 211 - 911 pg/mL  LDL Cholesterol, Direct  Result Value Ref Range   Direct LDL 96.0 mg/dL  POCT Urinalysis Dipstick (Automated)  Result Value Ref Range    Color, UA yellow    Clarity, UA clear    Glucose, UA Negative Negative   Bilirubin, UA negative    Ketones, UA negative    Spec Grav, UA 1.020 1.010 - 1.025   Blood, UA negative    pH, UA 6.0 5.0 - 8.0   Protein, UA Negative Negative   Urobilinogen, UA 0.2 0.2 or 1.0 E.U./dL   Nitrite, UA negative    Leukocytes, UA Trace (A) Negative   Previous GFR = 36.5  Assessment & Plan:   Problem List Items Addressed This Visit     Hyperlipidemia associated with type 2 diabetes mellitus (HCC)    Update FLP off lovastatin.       Relevant Orders   LDL Cholesterol, Direct (Completed)   Chronic kidney disease, stage 3b (HCC)    GFR runs 25-35, on latest check GFR 36.  Update renal panel and vit D off replacement.       Relevant Orders   Renal function panel (Completed)   VITAMIN D 25 Hydroxy (Vit-D Deficiency, Fractures) (Completed)   B12 deficiency    Update vitamin b12 off replacement.       Relevant Orders   Vitamin B12 (Completed)   Type 2 diabetes with nephropathy (HCC)    Update A1c off medication.       Relevant Orders   Hemoglobin A1c (Completed)   Acute cystitis with hematuria    S/p 2 week cipro 500mg  BID course.  Symptoms fully resolved. Update UA today ensure cleared.  Update renal panel after floroquinolone course with new pedal edema. No noted tendon pain.       Relevant Orders   POCT Urinalysis Dipstick (Automated) (Completed)   Pedal edema - Primary    Noted more recently tender pedal edema.  Supportive measures reviewed - limit salt/sodium, increase water, elevate legs.  Will update renal function.       Other Visit Diagnoses     Glucosuria            No orders of the defined types were placed in this encounter.   Orders Placed This Encounter  Procedures   Renal function panel   Hemoglobin A1c   VITAMIN D 25 Hydroxy (Vit-D Deficiency, Fractures)  Vitamin B12   LDL Cholesterol, Direct   POCT Urinalysis Dipstick (Automated)    Patient  Instructions  Labs today including A1c and kidney function.  Repeat urinalysis today.  Drink lots of water. Limit salt/sodium in diet. Elevate legs. Let us know leg swelling if not improving over time.   Follow up plan: Return in about 6 months (around 09/05/2023), or if symptoms worsen or fail to improve, for annual exam, prior fasting for blood work, medicare wellness visit.  Eustaquio Boyden, MD

## 2023-03-05 NOTE — Assessment & Plan Note (Signed)
S/p 2 week cipro 500mg  BID course.  Symptoms fully resolved. Update UA today ensure cleared.  Update renal panel after floroquinolone course with new pedal edema. No noted tendon pain.

## 2023-03-05 NOTE — Patient Instructions (Addendum)
Labs today including A1c and kidney function.  Repeat urinalysis today.  Drink lots of water. Limit salt/sodium in diet. Elevate legs. Let us know leg swelling if not improving over time.

## 2023-03-05 NOTE — Assessment & Plan Note (Addendum)
Noted more recently tender pedal edema.  Supportive measures reviewed - limit salt/sodium, increase water, elevate legs.  Will update renal function.

## 2023-03-12 DIAGNOSIS — M7062 Trochanteric bursitis, left hip: Secondary | ICD-10-CM | POA: Diagnosis not present

## 2023-03-12 DIAGNOSIS — M7061 Trochanteric bursitis, right hip: Secondary | ICD-10-CM | POA: Diagnosis not present

## 2023-03-25 ENCOUNTER — Ambulatory Visit (INDEPENDENT_AMBULATORY_CARE_PROVIDER_SITE_OTHER): Payer: PPO

## 2023-03-25 VITALS — Ht 74.0 in | Wt 243.0 lb

## 2023-03-25 DIAGNOSIS — Z0000 Encounter for general adult medical examination without abnormal findings: Secondary | ICD-10-CM

## 2023-03-25 NOTE — Patient Instructions (Signed)
Mr. Luis Sweeney , Thank you for taking time to come for your Medicare Wellness Visit. I appreciate your ongoing commitment to your health goals. Please review the following plan we discussed and let me know if I can assist you in the future.   These are the goals we discussed:  Goals      DIET - EAT MORE FRUITS AND VEGETABLES     Increase physical activity     Starting 02/20/2016, I will attempt to do at least 5-15 minutes of chair exercises and walk on my treadmill as time permits.      Patient Stated     01/19/2020, I will maintain and continue medications as prescribed.      Patient Stated     03/20/2021, I will maintain and continue medications as prescribed.     Patient Stated     No new goals     Track and Manage Fluids and Swelling-Heart Failure     Timeframe:  Long-Range Goal Priority:  Medium Start Date:    10/23/21                         Expected End Date:   10/23/22                    Follow Up Date July 2023   - call office if I gain more than 2 pounds in one day or 5 pounds in one week - keep legs up while sitting - use salt in moderation - watch for swelling in feet, ankles and legs every day    Why is this important?   It is important to check your weight daily and watch how much salt and liquids you have.  It will help you to manage your heart failure.    Notes:         This is a list of the screening recommended for you and due dates:  Health Maintenance  Topic Date Due   Zoster (Shingles) Vaccine (1 of 2) Never done   Eye exam for diabetics  03/27/2010   Complete foot exam   03/03/2018   COVID-19 Vaccine (3 - Moderna risk series) 02/05/2020   DTaP/Tdap/Td vaccine (3 - Tdap) 05/31/2022   Flu Shot  05/13/2023   Yearly kidney health urinalysis for diabetes  07/16/2023   Hemoglobin A1C  09/05/2023   Yearly kidney function blood test for diabetes  03/04/2024   Medicare Annual Wellness Visit  03/24/2024   Pneumonia Vaccine  Completed   HPV Vaccine  Aged Out     Advanced directives: none  Conditions/risks identified: Aim for 30 minutes of exercise or brisk walking, 6-8 glasses of water, and 5 servings of fruits and vegetables each day.   Next appointment: Follow up in one year for your annual wellness visit. 03/28/24 @ 10:45 televisit  Preventive Care 65 Years and Older, Male  Preventive care refers to lifestyle choices and visits with your health care provider that can promote health and wellness. What does preventive care include? A yearly physical exam. This is also called an annual well check. Dental exams once or twice a year. Routine eye exams. Ask your health care provider how often you should have your eyes checked. Personal lifestyle choices, including: Daily care of your teeth and gums. Regular physical activity. Eating a healthy diet. Avoiding tobacco and drug use. Limiting alcohol use. Practicing safe sex. Taking low doses of aspirin every day. Taking vitamin and mineral  supplements as recommended by your health care provider. What happens during an annual well check? The services and screenings done by your health care provider during your annual well check will depend on your age, overall health, lifestyle risk factors, and family history of disease. Counseling  Your health care provider may ask you questions about your: Alcohol use. Tobacco use. Drug use. Emotional well-being. Home and relationship well-being. Sexual activity. Eating habits. History of falls. Memory and ability to understand (cognition). Work and work Astronomer. Screening  You may have the following tests or measurements: Height, weight, and BMI. Blood pressure. Lipid and cholesterol levels. These may be checked every 5 years, or more frequently if you are over 81 years old. Skin check. Lung cancer screening. You may have this screening every year starting at age 36 if you have a 30-pack-year history of smoking and currently smoke or have quit  within the past 15 years. Fecal occult blood test (FOBT) of the stool. You may have this test every year starting at age 25. Flexible sigmoidoscopy or colonoscopy. You may have a sigmoidoscopy every 5 years or a colonoscopy every 10 years starting at age 74. Prostate cancer screening. Recommendations will vary depending on your family history and other risks. Hepatitis C blood test. Hepatitis B blood test. Sexually transmitted disease (STD) testing. Diabetes screening. This is done by checking your blood sugar (glucose) after you have not eaten for a while (fasting). You may have this done every 1-3 years. Abdominal aortic aneurysm (AAA) screening. You may need this if you are a current or former smoker. Osteoporosis. You may be screened starting at age 84 if you are at high risk. Talk with your health care provider about your test results, treatment options, and if necessary, the need for more tests. Vaccines  Your health care provider may recommend certain vaccines, such as: Influenza vaccine. This is recommended every year. Tetanus, diphtheria, and acellular pertussis (Tdap, Td) vaccine. You may need a Td booster every 10 years. Zoster vaccine. You may need this after age 56. Pneumococcal 13-valent conjugate (PCV13) vaccine. One dose is recommended after age 39. Pneumococcal polysaccharide (PPSV23) vaccine. One dose is recommended after age 58. Talk to your health care provider about which screenings and vaccines you need and how often you need them. This information is not intended to replace advice given to you by your health care provider. Make sure you discuss any questions you have with your health care provider. Document Released: 10/25/2015 Document Revised: 06/17/2016 Document Reviewed: 07/30/2015 Elsevier Interactive Patient Education  2017 [employer].  Fall Prevention in the Home Falls can cause injuries. They can happen to people of all ages. There are many things you can do  to make your home safe and to help prevent falls. What can I do on the outside of my home? Regularly fix the edges of walkways and driveways and fix any cracks. Remove anything that might make you trip as you walk through a door, such as a raised step or threshold. Trim any bushes or trees on the path to your home. Use bright outdoor lighting. Clear any walking paths of anything that might make someone trip, such as rocks or tools. Regularly check to see if handrails are loose or broken. Make sure that both sides of any steps have handrails. Any raised decks and porches should have guardrails on the edges. Have any leaves, snow, or ice cleared regularly. Use sand or salt on walking paths during winter. Clean up any  spills in your garage right away. This includes oil or grease spills. What can I do in the bathroom? Use night lights. Install grab bars by the toilet and in the tub and shower. Do not use towel bars as grab bars. Use non-skid mats or decals in the tub or shower. If you need to sit down in the shower, use a plastic, non-slip stool. Keep the floor dry. Clean up any water that spills on the floor as soon as it happens. Remove soap buildup in the tub or shower regularly. Attach bath mats securely with double-sided non-slip rug tape. Do not have throw rugs and other things on the floor that can make you trip. What can I do in the bedroom? Use night lights. Make sure that you have a light by your bed that is easy to reach. Do not use any sheets or blankets that are too big for your bed. They should not hang down onto the floor. Have a firm chair that has side arms. You can use this for support while you get dressed. Do not have throw rugs and other things on the floor that can make you trip. What can I do in the kitchen? Clean up any spills right away. Avoid walking on wet floors. Keep items that you use a lot in easy-to-reach places. If you need to reach something above you, use  a strong step stool that has a grab bar. Keep electrical cords out of the way. Do not use floor polish or wax that makes floors slippery. If you must use wax, use non-skid floor wax. Do not have throw rugs and other things on the floor that can make you trip. What can I do with my stairs? Do not leave any items on the stairs. Make sure that there are handrails on both sides of the stairs and use them. Fix handrails that are broken or loose. Make sure that handrails are as long as the stairways. Check any carpeting to make sure that it is firmly attached to the stairs. Fix any carpet that is loose or worn. Avoid having throw rugs at the top or bottom of the stairs. If you do have throw rugs, attach them to the floor with carpet tape. Make sure that you have a light switch at the top of the stairs and the bottom of the stairs. If you do not have them, ask someone to add them for you. What else can I do to help prevent falls? Wear shoes that: Do not have high heels. Have rubber bottoms. Are comfortable and fit you well. Are closed at the toe. Do not wear sandals. If you use a stepladder: Make sure that it is fully opened. Do not climb a closed stepladder. Make sure that both sides of the stepladder are locked into place. Ask someone to hold it for you, if possible. Clearly mark and make sure that you can see: Any grab bars or handrails. First and last steps. Where the edge of each step is. Use tools that help you move around (mobility aids) if they are needed. These include: Canes. Walkers. Scooters. Crutches. Turn on the lights when you go into a dark area. Replace any light bulbs as soon as they burn out. Set up your furniture so you have a clear path. Avoid moving your furniture around. If any of your floors are uneven, fix them. If there are any pets around you, be aware of where they are. Review your medicines with your doctor. Some  medicines can make you feel dizzy. This can  increase your chance of falling. Ask your doctor what other things that you can do to help prevent falls. This information is not intended to replace advice given to you by your health care provider. Make sure you discuss any questions you have with your health care provider. Document Released: 07/25/2009 Document Revised: 03/05/2016 Document Reviewed: 11/02/2014 Elsevier Interactive Patient Education  2017 [employer].

## 2023-03-25 NOTE — Progress Notes (Signed)
I connected with  Luis Sweeney on 03/25/23 by a audio enabled telemedicine application and verified that I am speaking with the correct person using two identifiers.  Patient Location: Home  Provider Location: Home Office  I discussed the limitations of evaluation and management by telemedicine. The patient expressed understanding and agreed to proceed.  Subjective:   Luis Sweeney is a 82 y.o. male who presents for Medicare Annual/Subsequent preventive examination.  Review of Systems      Cardiac Risk Factors include: advanced age (>43men, >14 women);hypertension;male gender;diabetes mellitus;sedentary lifestyle     Objective:    Today's Vitals   03/25/23 1112 03/25/23 1113  Weight: 243 lb (110.2 kg)   Height: 6\' 2"  (1.88 m)   PainSc:  6    Body mass index is 31.2 kg/m.     03/25/2023   11:21 AM 07/08/2022    9:38 PM 07/01/2022    6:48 PM 03/23/2022    1:56 PM 05/29/2021    7:00 AM 05/28/2021   11:44 PM 03/20/2021    1:23 PM  Advanced Directives  Does Patient Have a Medical Advance Directive? No Yes Yes No No No No  Type of Advance Directive  Healthcare Power of [employer] Power of Attorney      Does patient want to make changes to medical advance directive?  No - Guardian declined       Would patient like information on creating a medical advance directive? No - Patient declined   No - Patient declined No - Patient declined Yes (ED - Information included in AVS) No - Patient declined    Current Medications (verified) Outpatient Encounter Medications as of 03/25/2023  Medication Sig   aspirin EC 81 MG tablet Take 81 mg by mouth daily.   No facility-administered encounter medications on file as of 03/25/2023.    Allergies (verified) Prednisone, Codeine, Lipitor [atorvastatin], Morphine, and Simvastatin   History: Past Medical History:  Diagnosis Date   Acute duodenal ulcer with hemorrhage but without obstruction    required  bld. transfusion   Anemia, iron  deficiency    Arthritis    OA- pt. reports its. minor   CAD (coronary artery disease)    a. a. 2004 PCI/DES LAD; b. 06/2016 Cath: LM nl, LAD patent stent, D1 jailed, 90p, LCX nl, OM1 small, nl, RCA nl->Med Rx; c. 04/2021 MV: EF 46%, apical inf infarct. No ischemia.   CKD (chronic kidney disease), stage III (HCC)    COVID-19 virus infection 10/2020   Diverticulosis of colon    Duodenal stricture - recurrent 09/21/2014   Erb's palsy    birth trauma, pt. reports he was 11lbs. + at birth    GERD (gastroesophageal reflux disease)    H/O hiatal hernia    Helicobacter pylori gastritis    recurrent   Hypercholesteremia    Hyperglycemia    Hypertension    Ischemic cardiomyopathy    a. 04/2022 Echo: EF 45-50%, mild antlat HK. Nl RV fxn.   Myocardial infarct (HCC)    NAFLD (nonalcoholic fatty liver disease) 1610   with liver cysts   Paralysis of upper limb (HCC)    R arm- birth injury   Personal history of colonic polyps    Small bowel obstruction (HCC)    Urticaria    Lat left arm   Past Surgical History:  Procedure Laterality Date   ANTERIOR CERVICAL DECOMP/DISCECTOMY FUSION  03/2013   C3/4, C4/5 HNP (kritzer)   ANTERIOR CERVICAL DECOMP/DISCECTOMY FUSION N/A  03/15/2013   Procedure: ANTERIOR CERVICAL DECOMPRESSION/DISCECTOMY FUSION 2 LEVELS;  Surgeon: Reinaldo Meeker, MD;  Location: [facility];  Service: Neurosurgery;  Laterality: N/A;  Cervical three-four, cervical four-five anterior cervical diskectomy fusion with a trabecular metal plus plate    CARDIAC CATHETERIZATION  2006   stent placed   CARDIAC CATHETERIZATION N/A 07/08/2016   Procedure: Left Heart Cath and Coronary Angiography;  Surgeon: Tonny Bollman, MD;  Location: [facility] CV [facility];  Service: Cardiovascular;  Laterality: N/A;   CATARACT EXTRACTION W/PHACO Right 11/04/2021   Procedure: CATARACT EXTRACTION PHACO AND INTRAOCULAR LENS PLACEMENT (IOC) RIGHT VIVITY LENS 7.38 00:56.1;  Surgeon: Galen Manila, MD;  Location: [facility];  Service: Ophthalmology;  Laterality: Right;   CATARACT EXTRACTION W/PHACO Left 11/18/2021   Procedure: CATARACT EXTRACTION PHACO AND INTRAOCULAR LENS PLACEMENT (IOC) LEFT VIVITY LENS 7.96 00:47.1;  Surgeon: Galen Manila, MD;  Location: [facility];  Service: Ophthalmology;  Laterality: Left;  wants late arrival   COLONOSCOPY  multiple   diverticulosis, int hem, h/o adenomatous polyps Leone Payor)   COLONOSCOPY  07/2014   severe diverticulosis, no rpt needed Leone Payor)   CORONARY STENT PLACEMENT  2006   drug-eluting   ESOPHAGOGASTRODUODENOSCOPY  11/26/10   mild gastritis, duodenitis, duodenal stricture (Dr. Leone Payor)   ESOPHAGOGASTRODUODENOSCOPY  10//2015   duodenal stricture, ?recurrent ulcer Leone Payor)   HERNIA REPAIR Right 2006   Dr. Daphine Deutscher   KNEE SURGERY     left   SHOULDER SURGERY     right   spinal cyst removal     WRIST SURGERY     XI ROBOTIC ASSISTED INGUINAL HERNIA REPAIR WITH MESH Right 05/29/2021   Procedure: XI ROBOTIC ASSISTED INGUINAL HERNIA REPAIR WITH MESH;  Surgeon: Carolan Shiver, MD;  Location: [facility];  Service: General;  Laterality: Right;   Family History  Problem Relation Age of Onset   Diabetes Father    Heart disease Father        CHF   Kidney disease Father        kidney failure   Heart failure Father    Diabetes Sister    Hypertension Sister    Cancer Sister        pelvic mass   Hypertension Sister    Arthritis Sister    Ovarian cancer Other    Social History   Socioeconomic History   Marital status: Married    Spouse name: Not on file   Number of children: 3   Years of education: Not on file   Highest education level: Not on file  Occupational History   Occupation: HT Production assistant, radio: [employer]    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   Social Determinants of Health   Financial Resource Strain: Low Risk  (03/25/2023)   Overall Financial Resource Strain (CARDIA)    Difficulty of Paying Living Expenses: Not hard at all  Food Insecurity: No Food Insecurity (03/25/2023)   Hunger Vital Sign    Worried About Running Out of Food in the Last Year: Never true    Ran Out of Food in the Last Year: Never true  Transportation Needs: No Transportation Needs (03/25/2023)   PRAPARE - Administrator, Civil Service (Medical): No    Lack of Transportation (Non-Medical): No  Physical Activity: Inactive (03/25/2023)   Exercise Vital Sign    Days of Exercise per Week: 0 days    Minutes of Exercise per Session: 0 min  Stress: No Stress Concern Present (03/25/2023)   [employer] of Occupational Health - Occupational Stress Questionnaire    Feeling of Stress : Not at all  Social Connections: Moderately Integrated (03/25/2023)   Social Connection and Isolation Panel [NHANES]    Frequency of Communication with Friends and Family: More than three times a week    Frequency of Social Gatherings with Friends and Family: More than three times a week    Attends Religious Services: More than 4 times per year    Active Member of [employer] or Organizations: No    Attends Hospital doctor: Never    Marital Status: Married    Tobacco Counseling Counseling given: Not Answered   Clinical Intake:  Pre-visit preparation completed: Yes  Pain : 0-10 Pain Score: 6  Pain Type: Chronic pain Pain Location: Hip Pain Orientation: Right, Left Pain Descriptors / Indicators: Sharp, Constant Pain Onset: More than a month ago     Nutritional Risks: None Diabetes: No  How often do you need to have someone help you when you read instructions, pamphlets, or other written materials from your doctor or pharmacy?: 1 - Never  Diabetic? Pre-diabetes per pt  Interpreter Needed?: No  Information entered by :: C.Lalla Laham LPN   Activities of Daily Living    03/25/2023   11:22 AM  In your present state of health, do you have any difficulty performing the following activities:  Hearing? 0  Vision? 0  Difficulty concentrating or making decisions? 0  Walking or climbing stairs? 0  Dressing or bathing? 0  Doing errands, shopping? 0  Preparing Food and eating ? N  Using the Toilet? N  In the past six months, have you accidently leaked urine? N  Do you have problems with loss of bowel control? N  Managing your Medications? N  Managing your Finances? N  Housekeeping or managing your Housekeeping? N    Patient Care Team: Eustaquio Boyden, MD as PCP - General (Family Medicine) Tonny Bollman, MD as PCP - Cardiology (Cardiology) Dingeldein, Viviann Spare, MD as Consulting Physician (Ophthalmology) Tonny Bollman, MD as Consulting Physician (Cardiology) Aliene Beams, MD as Consulting Physician (Neurosurgery) Kennon Rounds as Physician Assistant (Cardiology) Kathyrn Sheriff, [facility] as Pharmacist (Pharmacist)  Indicate any recent Medical Services you may have received from other than [facility] providers in the past year (date may be approximate).     Assessment:   This is a routine wellness examination for [place].  Hearing/Vision screen Hearing  Screening -  Comments:: No hearing issues Vision Screening - Comments:: Glasses - [facility]  Dietary issues and exercise activities discussed: Current Exercise Habits: The patient does not participate in regular exercise at present, Exercise limited by: None identified   Goals Addressed             This Visit's Progress    Patient Stated       No new goals       Depression Screen    03/25/2023   11:20 AM 03/23/2022    1:53 PM 03/20/2021    1:25 PM 01/19/2020   11:18 AM 12/12/2018   11:14 AM 03/03/2017   10:26 AM 03/03/2017    9:05 AM  PHQ 2/9 Scores  PHQ - 2 Score 0 0 0 0 0 0 0  PHQ- 9 Score  0 0 0       Fall Risk    03/25/2023   11:17 AM 07/22/2022    2:33 PM 03/23/2022    1:57 PM 03/20/2021    1:25 PM 01/19/2020   11:18 AM  Fall Risk   Falls in the past year? 0 0 0 0 0  Number falls in past yr: 0 0 0 0 0  Injury with Fall? 0 0 0 0 0  Risk for fall due to : No Fall Risks No Fall Risks No Fall Risks Medication side effect Medication side effect  Follow up Falls prevention discussed;Falls evaluation completed Falls evaluation completed Falls evaluation completed Falls evaluation completed;Falls prevention discussed Falls evaluation completed;Falls prevention discussed    FALL RISK PREVENTION PERTAINING TO THE HOME:  Any stairs in or around the home? No  If so, are there any without handrails? No  Home free of loose throw rugs in walkways, pet beds, electrical cords, etc? Yes  Adequate lighting in your home to reduce risk of falls? Yes   ASSISTIVE DEVICES UTILIZED TO PREVENT FALLS:  Life alert? No  Use of a cane, walker or w/c? No  Grab bars in the bathroom? Yes  Shower chair or bench in shower? Yes  Elevated toilet seat or a handicapped toilet? Yes    Cognitive Function:    03/20/2021    1:26 PM 01/19/2020   11:20 AM 02/20/2016    8:40 AM  MMSE - Mini Mental State Exam  Not completed: Refused    Orientation to time  5 5  Orientation to Place  5 5  Registration  3 3   Attention/ Calculation  5 0  Recall  3 3  Language- name 2 objects   0  Language- repeat  1 1  Language- follow 3 step command   3  Language- read & follow direction   0  Write a sentence   0  Copy design   0  Total score   20        03/25/2023   11:23 AM  6CIT Screen  What Year? 0 points  What month? 0 points  What time? 0 points  Count back from 20 0 points  Months in reverse 0 points  Repeat phrase 0 points  Total Score 0 points    Immunizations Immunization History  Administered Date(s) Administered   Influenza Whole 07/10/2008   Moderna Sars-Covid-2 Vaccination 12/11/2019, 01/08/2020   Pneumococcal Conjugate-13 02/20/2015   Pneumococcal Polysaccharide-23 10/12/2002, 07/10/2008   Td 11/13/2000, 05/31/2012    TDAP status: Due, Education has been provided regarding the importance of this vaccine. Advised may receive this vaccine at local  pharmacy or Health Dept. Aware to provide a copy of the vaccination record if obtained from local pharmacy or Health Dept. Verbalized acceptance and understanding.  Flu Vaccine status: Declined, Education has been provided regarding the importance of this vaccine but patient still declined. Advised may receive this vaccine at local pharmacy or Health Dept. Aware to provide a copy of the vaccination record if obtained from local pharmacy or Health Dept. Verbalized acceptance and understanding.  Pneumococcal vaccine status: Up to date  Covid-19 vaccine status: Information provided on how to obtain vaccines.   Qualifies for Shingles Vaccine? Yes   Zostavax completed No   Shingrix Completed?: Yes  Screening Tests Health Maintenance  Topic Date Due   Zoster Vaccines- Shingrix (1 of 2) Never done   OPHTHALMOLOGY EXAM  03/27/2010   FOOT EXAM  03/03/2018   COVID-19 Vaccine (3 - Moderna risk series) 02/05/2020   DTaP/Tdap/Td (3 - Tdap) 05/31/2022   INFLUENZA VACCINE  05/13/2023   Diabetic kidney evaluation - Urine ACR  07/16/2023    HEMOGLOBIN A1C  09/05/2023   Diabetic kidney evaluation - eGFR measurement  03/04/2024   Medicare Annual Wellness (AWV)  03/24/2024   Pneumonia Vaccine 25+ Years old  Completed   HPV VACCINES  Aged Out    Health Maintenance  Health Maintenance Due  Topic Date Due   Zoster Vaccines- Shingrix (1 of 2) Never done   OPHTHALMOLOGY EXAM  03/27/2010   FOOT EXAM  03/03/2018   COVID-19 Vaccine (3 - Moderna risk series) 02/05/2020   DTaP/Tdap/Td (3 - Tdap) 05/31/2022    Colorectal cancer screening: No longer required.   Lung Cancer Screening: (Low Dose CT Chest recommended if Age 1-80 years, 30 pack-year currently smoking OR have quit w/in 15years.) does not qualify.   Lung Cancer Screening Referral: no  Additional Screening:  Hepatitis C Screening: does not qualify; Completed no  Vision Screening: Recommended annual ophthalmology exams for early detection of glaucoma and other disorders of the eye. Is the patient up to date with their annual eye exam?  Yes  Who is the provider or what is the name of the office in which the patient attends annual eye exams? [facility] If pt is not established with a provider, would they like to be referred to a provider to establish care? Yes .   Dental Screening: Recommended annual dental exams for proper oral hygiene  Community Resource Referral / Chronic Care Management: CRR required this visit?  No   CCM required this visit?  No      Plan:     I have personally reviewed and noted the following in the patient's chart:   Medical and social history Use of alcohol, tobacco or illicit drugs  Current medications and supplements including opioid prescriptions. Patient is not currently taking opioid prescriptions. Functional ability and status Nutritional status Physical activity Advanced directives List of other physicians Hospitalizations, surgeries, and ER visits in previous 12 months Vitals Screenings to include cognitive,  depression, and falls Referrals and appointments  In addition, I have reviewed and discussed with patient certain preventive protocols, quality metrics, and best practice recommendations. A written personalized care plan for preventive services as well as general preventive health recommendations were provided to patient.     Maryan Puls, LPN   0/98/1191   Nurse Notes: Vaccinations: declines all Influenza vaccine: recommend every Fall Pneumococcal vaccine: recommend once per lifetime Prevnar-20 Tdap vaccine: recommend every 10 years Shingles vaccine: recommend Shingrix which is 2 doses 2-6  months apart and over 90% effective     Covid-19: recommend 2 doses one month apart with a booster 6 months later

## 2023-03-30 ENCOUNTER — Ambulatory Visit (INDEPENDENT_AMBULATORY_CARE_PROVIDER_SITE_OTHER): Payer: PPO | Admitting: Family

## 2023-03-30 ENCOUNTER — Encounter: Payer: Self-pay | Admitting: Family

## 2023-03-30 VITALS — BP 138/80 | HR 71 | Temp 97.2°F | Ht 74.0 in | Wt 242.0 lb

## 2023-03-30 DIAGNOSIS — R252 Cramp and spasm: Secondary | ICD-10-CM | POA: Diagnosis not present

## 2023-03-30 DIAGNOSIS — N1832 Chronic kidney disease, stage 3b: Secondary | ICD-10-CM

## 2023-03-30 DIAGNOSIS — R600 Localized edema: Secondary | ICD-10-CM | POA: Diagnosis not present

## 2023-03-30 MED ORDER — FUROSEMIDE 20 MG PO TABS
20.0000 mg | ORAL_TABLET | Freq: Every day | ORAL | 0 refills | Status: DC
Start: 2023-03-30 — End: 2023-04-26

## 2023-03-30 NOTE — Assessment & Plan Note (Addendum)
Worsening  Decreased peripheral pulses but significant 3+ pitting edema  May consider vascular if no improvement however no rest of movement pain which is reassuring Rx furosemide 20 mg once daily, make sure to stay adequately hydrated.  F/u with close f/u one week with PCP  Did advise supportive measures: compression stockings, elevation of feet, watch sodium intake.  Consider trial d/c celebrex to see if edema improves.

## 2023-03-30 NOTE — Progress Notes (Signed)
Established Patient Office Visit  Subjective:   Patient ID: Luis Sweeney, male    DOB: [birth year]  Age: 82 y.o. MRN: [id number]  CC:  Chief Complaint  Patient presents with   Leg Swelling    B/l leg swelling     HPI: Luis Sweeney is a 82 y.o. male presenting on 03/30/2023 for Leg Swelling (B/l leg swelling )  Saw his PCP 5/24 for bil leg swelling , ws recommended to work on supportive measures and renal function was ordered . Renal function remained stable from prior labs. He states that since he was last seen he has increasing edema, and he states his daughter that is a nurse mashed on them and told him to get into the office. He does not wear compression stockings but will recline and elevate them when sitting, usually at night time. He denies pain in the legs. He doesn't report cold extremities, but he does have purple discoloration. He does not see a nephrologist. He has not seen a vascular doctor in the past either. No pain with walking and or pain with rest. Does get muscle cramps once in a while, mainly at night time .   No increasing sob or DOE. He does have DOE but states this is normal for him, no recent change in this. More so when he gets working around the yard and or over exerting himself. He does state neuralgias, numbness  and or tingling at times but more chronic. Not worse with these current symptoms, has been for a long time, a few years.    Last metabolic panel Lab Results  Component Value Date   GLUCOSE 101 (H) 03/05/2023   NA 140 03/05/2023   K 4.6 03/05/2023   CL 103 03/05/2023   CO2 31 03/05/2023   BUN 22 03/05/2023   CREATININE 1.66 (H) 03/05/2023   GFRNONAA 33 (L) 07/08/2022   CALCIUM 8.7 03/05/2023   PHOS 2.8 03/05/2023   PROT 6.1 07/15/2022   ALBUMIN 3.8 03/05/2023   LABGLOB 2.0 09/29/2018   AGRATIO 2.0 09/29/2018   BILITOT 0.8 07/15/2022   ALKPHOS 28 (L) 07/15/2022   AST 12 07/15/2022   ALT 12 07/15/2022   ANIONGAP 8 07/08/2022           ROS: Negative unless specifically indicated above in HPI.   Relevant past medical history reviewed and updated as indicated.   Allergies and medications reviewed and updated.   Current Outpatient Medications:    aspirin EC 81 MG tablet, Take 81 mg by mouth daily., Disp: , Rfl:    celecoxib (CELEBREX) 200 MG capsule, Take 200 mg by mouth daily., Disp: , Rfl:    furosemide (LASIX) 20 MG tablet, Take 1 tablet (20 mg total) by mouth daily., Disp: 30 tablet, Rfl: 0  Allergies  Allergen Reactions   Prednisone Other (See Comments)    Feels really bad   Codeine Hives and Itching   Lipitor [Atorvastatin] Other (See Comments)    Reaction:  Muscle pain    Morphine Hives and Itching   Simvastatin Other (See Comments)    Reaction:  Muscle pain     Objective:   BP 138/80   Pulse 71   Temp (!) 97.2 F (36.2 C) (Temporal)   Ht 6\' 2"  (1.88 m)   Wt 242 lb (109.8 kg)   SpO2 97%   BMI 31.07 kg/m    Physical Exam Constitutional:      General: He is not in acute distress.  Appearance: Normal appearance. He is normal weight. He is not ill-appearing, toxic-appearing or diaphoretic.  Cardiovascular:     Rate and Rhythm: Normal rate and regular rhythm.     Pulses:          Dorsalis pedis pulses are 1+ on the right side and 1+ on the left side.       Posterior tibial pulses are 1+ on the right side and 1+ on the left side.     Comments: Purple discoloration bil lower extremities, worse in the toes. Normal temperature.  Pulmonary:     Effort: Pulmonary effort is normal.     Breath sounds: Normal breath sounds.  Musculoskeletal:        General: Normal range of motion.     Right lower leg: 3+ Edema present.     Left lower leg: 3+ Edema present.  Neurological:     General: No focal deficit present.     Mental Status: He is alert and oriented to person, place, and time. Mental status is at baseline.  Psychiatric:        Mood and Affect: Mood normal.        Behavior: Behavior normal.         Thought Content: Thought content normal.        Judgment: Judgment normal.     Assessment & Plan:  Pedal edema Assessment & Plan: Worsening  Decreased peripheral pulses but significant 3+ pitting edema  May consider vascular if no improvement however no rest of movement pain which is reassuring Rx furosemide 20 mg once daily, make sure to stay adequately hydrated.  F/u with close f/u one week with PCP  Did advise supportive measures: compression stockings, elevation of feet, watch sodium intake.  Consider trial d/c celebrex to see if edema improves.  Orders: -     Furosemide; Take 1 tablet (20 mg total) by mouth daily.  Dispense: 30 tablet; Refill: 0  Chronic kidney disease, stage 3b (HCC)  Muscle cramps     Follow up plan: Return for f/u with PCP one week for pedal edema.  Mort Sawyers, FNP

## 2023-03-30 NOTE — Patient Instructions (Signed)
  Wear compression socks if able  Elevate feet at night.  Watch sodium intake.  Start furosemide trial for one week, follow up in office with your PCP in one week to reassess.    Regards,   Mort Sawyers FNP-C

## 2023-04-26 ENCOUNTER — Other Ambulatory Visit: Payer: Self-pay | Admitting: Family

## 2023-04-26 DIAGNOSIS — R600 Localized edema: Secondary | ICD-10-CM

## 2023-04-29 ENCOUNTER — Telehealth: Payer: Self-pay | Admitting: Family Medicine

## 2023-04-29 NOTE — Telephone Encounter (Signed)
Will see then, thank you.

## 2023-04-29 NOTE — Telephone Encounter (Signed)
Pt called in stating he has some bad consistent swelling in his legs & ankles. Pt states he's finishing up his fluid pills, didn't state name of meds, given by [facility]. Pt states he does tend to have pain that comes & goes as well. Scheduled pt with Dr. Reece Agar for tomorrow, 7/19 @ 2pm, due to no appts available today. Transferred pt to access nurse. Call back # [phone]

## 2023-04-30 ENCOUNTER — Encounter: Payer: Self-pay | Admitting: Family Medicine

## 2023-04-30 ENCOUNTER — Ambulatory Visit (INDEPENDENT_AMBULATORY_CARE_PROVIDER_SITE_OTHER): Payer: PPO | Admitting: Family Medicine

## 2023-04-30 VITALS — BP 134/82 | HR 68 | Temp 97.1°F | Ht 74.0 in | Wt 241.1 lb

## 2023-04-30 DIAGNOSIS — R600 Localized edema: Secondary | ICD-10-CM

## 2023-04-30 DIAGNOSIS — I499 Cardiac arrhythmia, unspecified: Secondary | ICD-10-CM | POA: Diagnosis not present

## 2023-04-30 DIAGNOSIS — N1832 Chronic kidney disease, stage 3b: Secondary | ICD-10-CM

## 2023-04-30 MED ORDER — FUROSEMIDE 40 MG PO TABS
40.0000 mg | ORAL_TABLET | Freq: Every day | ORAL | 0 refills | Status: DC
Start: 2023-04-30 — End: 2023-04-30

## 2023-04-30 MED ORDER — FUROSEMIDE 40 MG PO TABS
40.0000 mg | ORAL_TABLET | Freq: Every day | ORAL | 0 refills | Status: DC | PRN
Start: 2023-04-30 — End: 2023-05-31

## 2023-04-30 MED ORDER — POTASSIUM CHLORIDE CRYS ER 10 MEQ PO TBCR: 10.0000 meq | EXTENDED_RELEASE_TABLET | Freq: Every day | ORAL | 0 refills | Status: DC

## 2023-04-30 MED ORDER — POTASSIUM CHLORIDE CRYS ER 10 MEQ PO TBCR: 10.0000 meq | EXTENDED_RELEASE_TABLET | Freq: Every day | ORAL | 0 refills | Status: DC | PRN

## 2023-04-30 MED ORDER — CELECOXIB 100 MG PO CAPS: 100.0000 mg | ORAL_CAPSULE | Freq: Every day | ORAL | 0 refills | Status: DC

## 2023-04-30 NOTE — Assessment & Plan Note (Signed)
Update renal function with recent regular lasix use.

## 2023-04-30 NOTE — Addendum Note (Signed)
Addended by: Nanci Pina on: 04/30/2023 02:58 PM   Modules accepted: Orders

## 2023-04-30 NOTE — Patient Instructions (Addendum)
Labs and urine test today.  Heart sounds possibly irregular - I recommend EKG but our machine is down. We will call you next week for a nurse visit for EKG.  In the meantime, increase lasix to 40mg  daily as needed for leg swelling. Take potassium 1 tablet on days you take lasix. Drop celebrex to 100mg  daily - new dose sent to pharmacy.

## 2023-04-30 NOTE — Progress Notes (Addendum)
Ph: [phone] Fax: [phone]   Patient ID: Luis Sweeney, male    DOB: 1941-01-22, 82 y.o.   MRN: [id number]  This visit was conducted in person.  BP 134/82   Pulse 68   Temp (!) 97.1 F (36.2 C) (Temporal)   Ht 6\' 2"  (1.88 m)   Wt 241 lb 2 oz (109.4 kg)   SpO2 94%   BMI 30.96 kg/m    CC: leg swelling  Subjective:   HPI: Luis Sweeney is a 82 y.o. male presenting on 04/30/2023 for Leg Swelling (C/o ongoing B leg/ankle swelling and minor pain. )   Worsening swelling to bilateral legs over the past 2 months - in setting of known chronic kidney disease - acutely worse in the past 4-5 weeks.  Has seen our office three times for this - latest 1 month ago treated with lasix 20mg  daily for 1 month as well as routine supportive measures of leg elevation, compression stockings, limit sodium/salt intake. He's not wearing compression stockings.   Lasix 20mg  hasn't really caused significant increased UOP. Weight overall stable.  Denies chest pain, tightness, palpitations, headache, dizziness. No abd pain. No orthopnea, no PNDyspnea.  Notes chronic exertional dyspnea.   He is prescribed celebrex 200mg  daily by ortho for chronic joint pains.  No h/o afib.   CTA chest/abd/pelvis showed extensive coronary artery disease.  Nuclear stress test 06/2022 - no significant ischemia, low risk scan.  Echocardiogram 04/2022 - EF 45-50%, mildly decreased EF, mild anterolateral hypokinesis.      Relevant past medical, surgical, family and social history reviewed and updated as indicated. Interim medical history since our last visit reviewed. Allergies and medications reviewed and updated. Outpatient Medications Prior to Visit  Medication Sig Dispense Refill   aspirin EC 81 MG tablet Take 81 mg by mouth daily.     celecoxib (CELEBREX) 200 MG capsule Take 200 mg by mouth daily.     furosemide (LASIX) 20 MG tablet TAKE ONE TABLET (20 MG TOTAL) BY MOUTH DAILY. 30 tablet 0   No  facility-administered medications prior to visit.     Per HPI unless specifically indicated in ROS section below Review of Systems  Objective:  BP 134/82   Pulse 68   Temp (!) 97.1 F (36.2 C) (Temporal)   Ht 6\' 2"  (1.88 m)   Wt 241 lb 2 oz (109.4 kg)   SpO2 94%   BMI 30.96 kg/m   Wt Readings from Last 3 Encounters:  04/30/23 241 lb 2 oz (109.4 kg)  03/30/23 242 lb (109.8 kg)  03/25/23 243 lb (110.2 kg)      Physical Exam Vitals and nursing note reviewed.  Constitutional:      Appearance: Normal appearance. He is not ill-appearing.  HENT:     Mouth/Throat:     Mouth: Mucous membranes are moist.     Pharynx: Oropharynx is clear. No oropharyngeal exudate or posterior oropharyngeal erythema.  Eyes:     Extraocular Movements: Extraocular movements intact.     Conjunctiva/sclera: Conjunctivae normal.     Pupils: Pupils are equal, round, and reactive to light.  Neck:     Thyroid: No thyroid mass or thyromegaly.  Cardiovascular:     Rate and Rhythm: Normal rate. Rhythm irregularly irregular. Occasional Extrasystoles are present.    Pulses: Normal pulses.     Heart sounds: Normal heart sounds. No murmur heard. Pulmonary:     Effort: Pulmonary effort is normal. No respiratory distress.  Breath sounds: Normal breath sounds. No wheezing, rhonchi or rales.  Abdominal:     General: Bowel sounds are normal. There is no distension.     Palpations: Abdomen is soft. There is no mass.     Tenderness: There is no abdominal tenderness. There is no guarding or rebound.     Hernia: No hernia is present.  Musculoskeletal:        General: Swelling (from dorsal foot to mid calf bilaterally) present.     Right lower leg: Edema (2+) present.     Left lower leg: Edema (2+) present.  Neurological:     Mental Status: He is alert.       Results for orders placed or performed in visit on 03/05/23  Renal function panel  Result Value Ref Range   Sodium 140 135 - 145 mEq/L   Potassium  4.6 3.5 - 5.1 mEq/L   Chloride 103 96 - 112 mEq/L   CO2 31 19 - 32 mEq/L   Albumin 3.8 3.5 - 5.2 g/dL   BUN 22 6 - 23 mg/dL   Creatinine, Ser 1.32 (H) 0.40 - 1.50 mg/dL   Glucose, Bld 440 (H) 70 - 99 mg/dL   Phosphorus 2.8 2.3 - 4.6 mg/dL   GFR 10.27 (L) >25.36 mL/min   Calcium 8.7 8.4 - 10.5 mg/dL  Hemoglobin U4Q  Result Value Ref Range   Hgb A1c MFr Bld 6.6 (H) 4.6 - 6.5 %  VITAMIN D 25 Hydroxy (Vit-D Deficiency, Fractures)  Result Value Ref Range   VITD 38.60 30.00 - 100.00 ng/mL  Vitamin B12  Result Value Ref Range   Vitamin B-12 437 211 - 911 pg/mL  LDL Cholesterol, Direct  Result Value Ref Range   Direct LDL 96.0 mg/dL  POCT Urinalysis Dipstick (Automated)  Result Value Ref Range   Color, UA yellow    Clarity, UA clear    Glucose, UA Negative Negative   Bilirubin, UA negative    Ketones, UA negative    Spec Grav, UA 1.020 1.010 - 1.025   Blood, UA negative    pH, UA 6.0 5.0 - 8.0   Protein, UA Negative Negative   Urobilinogen, UA 0.2 0.2 or 1.0 E.U./dL   Nitrite, UA negative    Leukocytes, UA Trace (A) Negative   Lab Results  Component Value Date   TSH 3.158 07/02/2022    EKG - sinus arrhythmia, frequent PVCs, normal axis, intervals, no hypertrophy or acute ST/T changes.   Assessment & Plan:   Problem List Items Addressed This Visit     Chronic kidney disease, stage 3b (HCC)    Update renal function with recent regular lasix use.       Relevant Orders   Renal function panel   Irregular heart rhythm    Concern for possible afib which could contribute to pedal edema.  Unable to obtain EKG today as our machines are down due to global [employer] outage.  Discussed pros vs cons of anticoagulation. Will not start eliquis at this time, until he returns for EKG. Continue aspirin.  Check TSH.   ADDENDUM ==> found another working EKG machine in office, s/p EKG obtained that does not show atrial fibrillation but rather frequent PVCs. Pt asxs. Await labs.        Pedal edema - Primary    Ongoing. Increase lasix to 40mg  use daily PRN leg swelling.  Update Umicroalb, TSH, BNP.  Add potassium with lasix.  Continue supportive measures.  Relevant Medications   furosemide (LASIX) 40 MG tablet   Other Relevant Orders   Brain natriuretic peptide   Microalbumin / creatinine urine ratio   TSH     Meds ordered this encounter  Medications   celecoxib (CELEBREX) 100 MG capsule    Sig: Take 1 capsule (100 mg total) by mouth daily.    Dispense:  30 capsule    Refill:  0   DISCONTD: furosemide (LASIX) 40 MG tablet    Sig: Take 1 tablet (40 mg total) by mouth daily.    Dispense:  30 tablet    Refill:  0   DISCONTD: potassium chloride SA (KLOR-CON M) 10 MEQ tablet    Sig: Take 1 tablet (10 mEq total) by mouth daily. While on lasix    Dispense:  30 tablet    Refill:  0   furosemide (LASIX) 40 MG tablet    Sig: Take 1 tablet (40 mg total) by mouth daily as needed for fluid or edema.    Dispense:  30 tablet    Refill:  0   potassium chloride (KLOR-CON M) 10 MEQ tablet    Sig: Take 1 tablet (10 mEq total) by mouth daily as needed (with lasix).    Dispense:  30 tablet    Refill:  0    Orders Placed This Encounter  Procedures   Renal function panel   Brain natriuretic peptide   Microalbumin / creatinine urine ratio   TSH    Patient Instructions  Labs and urine test today.  Heart sounds possibly irregular - I recommend EKG but our machine is down. We will call you next week for a nurse visit for EKG.  In the meantime, increase lasix to 40mg  daily as needed for leg swelling. Take potassium 1 tablet on days you take lasix. Drop celebrex to 100mg  daily - new dose sent to pharmacy.   Follow up plan: Return if symptoms worsen or fail to improve.  Eustaquio Boyden, MD

## 2023-04-30 NOTE — Assessment & Plan Note (Addendum)
Concern for possible afib which could contribute to pedal edema.  Unable to obtain EKG today as our machines are down due to global [employer] outage.  Discussed pros vs cons of anticoagulation. Will not start eliquis at this time, until he returns for EKG. Continue aspirin.  Check TSH.   ADDENDUM ==> found another working EKG machine in office, s/p EKG obtained that does not show atrial fibrillation but rather frequent PVCs. Pt asxs. Await labs.

## 2023-04-30 NOTE — Assessment & Plan Note (Addendum)
Ongoing. Increase lasix to 40mg  use daily PRN leg swelling.  Update Umicroalb, TSH, BNP.  Add potassium with lasix.  Continue supportive measures.

## 2023-05-01 LAB — BRAIN NATRIURETIC PEPTIDE: Brain Natriuretic Peptide: 102 pg/mL — ABNORMAL HIGH (ref <100)

## 2023-05-01 LAB — TSH: TSH: 2.07 mIU/L (ref 0.40–4.50)

## 2023-05-01 LAB — MICROALBUMIN / CREATININE URINE RATIO
Creatinine, Urine: 72 mg/dL (ref 20–320)
Microalb Creat Ratio: 3 mg/g creat (ref <30)
Microalb, Ur: 0.2 mg/dL

## 2023-05-01 LAB — RENAL FUNCTION PANEL
Albumin: 4 g/dL (ref 3.6–5.1)
BUN/Creatinine Ratio: 15 (calc) (ref 6–22)
BUN: 31 mg/dL — ABNORMAL HIGH (ref 7–25)
CO2: 26 mmol/L (ref 20–32)
Calcium: 9 mg/dL (ref 8.6–10.3)
Chloride: 102 mmol/L (ref 98–110)
Creat: 2.05 mg/dL — ABNORMAL HIGH (ref 0.70–1.22)
Glucose, Bld: 94 mg/dL (ref 65–99)
Phosphorus: 3.7 mg/dL (ref 2.1–4.3)
Potassium: 4.5 mmol/L (ref 3.5–5.3)
Sodium: 140 mmol/L (ref 135–146)

## 2023-05-04 ENCOUNTER — Telehealth: Payer: Self-pay | Admitting: Cardiovascular Disease

## 2023-05-04 NOTE — Telephone Encounter (Signed)
Pt c/o swelling: STAT is pt has developed SOB within 24 hours  How much weight have you gained and in what time span?  No weight gain, patient states he lost weight   If swelling, where is the swelling located?  Legs, ankles  Are you currently taking a fluid pill?  Yes, Furosemide 20 MG once daily  Are you currently SOB?   Do you have a log of your daily weights (if so, list)?  246 lbs previously 241 lbs now   Have you gained 3 pounds in a day or 5 pounds in a week?   Have you traveled recently?  No

## 2023-05-04 NOTE — Telephone Encounter (Signed)
Returned call to patient who states "I'm carrying a lot of extra fluid." PCP has him taking Furosemide 20mg  daily. He has lost 5lbs. States PCP did EKG and was told his heart is skipping a beat. He was concerned so called to see if we thought he should be seen sooner than November. Looked up notes from PCP and EKG was noted normal sinus with PVCs. Educated that these are benign, extra beats. Spoke in detail about ways to control fluid (compression hose, elevation, sodium and water intake) and that with his kidneys and decreased EF, fluid volume is likely to always be some degree of an issue for him. No current appt scheduled with [facility]-would've been due in Nov-scheduled for early October. Asked that he call if anything changes or wants to be seen sooner.

## 2023-05-10 ENCOUNTER — Other Ambulatory Visit: Payer: Self-pay | Admitting: Family Medicine

## 2023-05-10 DIAGNOSIS — N1832 Chronic kidney disease, stage 3b: Secondary | ICD-10-CM

## 2023-05-25 DIAGNOSIS — M25551 Pain in right hip: Secondary | ICD-10-CM | POA: Diagnosis not present

## 2023-05-27 ENCOUNTER — Other Ambulatory Visit: Payer: Self-pay | Admitting: Family Medicine

## 2023-05-27 ENCOUNTER — Other Ambulatory Visit (INDEPENDENT_AMBULATORY_CARE_PROVIDER_SITE_OTHER): Payer: PPO

## 2023-05-27 DIAGNOSIS — R600 Localized edema: Secondary | ICD-10-CM

## 2023-05-27 DIAGNOSIS — N1832 Chronic kidney disease, stage 3b: Secondary | ICD-10-CM

## 2023-05-27 DIAGNOSIS — M1611 Unilateral primary osteoarthritis, right hip: Secondary | ICD-10-CM

## 2023-05-27 LAB — RENAL FUNCTION PANEL
Albumin: 4 g/dL (ref 3.5–5.2)
BUN: 25 mg/dL — ABNORMAL HIGH (ref 6–23)
CO2: 29 mEq/L (ref 19–32)
Calcium: 9.1 mg/dL (ref 8.4–10.5)
Chloride: 100 mEq/L (ref 96–112)
Creatinine, Ser: 1.81 mg/dL — ABNORMAL HIGH (ref 0.40–1.50)
GFR: 34.38 mL/min — ABNORMAL LOW (ref >60.00)
Glucose, Bld: 85 mg/dL (ref 70–99)
Phosphorus: 2.6 mg/dL (ref 2.3–4.6)
Potassium: 4.3 mEq/L (ref 3.5–5.1)
Sodium: 135 mEq/L (ref 135–145)

## 2023-05-27 NOTE — Telephone Encounter (Signed)
Celebrex last filled:  04/30/23, #30 Furosemide last filled:  04/30/23, #30 Potassium last filled:  04/30/23, #30 Last OV:  04/30/23, leg swelling Next OV:  none

## 2023-05-31 NOTE — Telephone Encounter (Signed)
ERx 

## 2023-06-09 DIAGNOSIS — M25551 Pain in right hip: Secondary | ICD-10-CM | POA: Diagnosis not present

## 2023-06-15 DIAGNOSIS — M25551 Pain in right hip: Secondary | ICD-10-CM | POA: Diagnosis not present

## 2023-06-28 ENCOUNTER — Other Ambulatory Visit: Payer: Self-pay | Admitting: Family Medicine

## 2023-06-28 DIAGNOSIS — R600 Localized edema: Secondary | ICD-10-CM

## 2023-06-28 DIAGNOSIS — M1611 Unilateral primary osteoarthritis, right hip: Secondary | ICD-10-CM

## 2023-06-30 ENCOUNTER — Telehealth: Payer: Self-pay

## 2023-06-30 NOTE — Telephone Encounter (Signed)
Patient scheduled.

## 2023-06-30 NOTE — Telephone Encounter (Signed)
Noted  

## 2023-06-30 NOTE — Telephone Encounter (Signed)
Pt had AWV on 03/25/23.  Plz schedule CPE and fasting lab (no food/drink- except water and/or blk coffee 5 hrs prior) visits for additional refills.

## 2023-06-30 NOTE — Telephone Encounter (Signed)
Celebex Last filled:  05/31/23, #30 Last OV:  04/30/23, pedal edema Next OV:  none

## 2023-07-02 MED ORDER — FAMOTIDINE 20 MG PO TABS: 20.0000 mg | ORAL_TABLET | Freq: Every day | ORAL | Status: DC

## 2023-07-02 NOTE — Telephone Encounter (Signed)
ERx However this is not best option in CKD. Would have him use PRN and eventually taper off.  If he'd like we can try different pain medicine tramadol PRN pain which is a synthetic opiate.  I also recommend he start pepcid 20mg  daily for stomach protection

## 2023-07-05 MED ORDER — TRAMADOL HCL 50 MG PO TABS
50.0000 mg | ORAL_TABLET | Freq: Three times a day (TID) | ORAL | 0 refills | Status: AC | PRN
Start: 2023-07-05 — End: ?

## 2023-07-05 NOTE — Addendum Note (Signed)
Addended by: Eustaquio Boyden on: 07/05/2023 05:25 PM   Modules accepted: Orders

## 2023-07-05 NOTE — Addendum Note (Signed)
Addended by: Nanci Pina on: 07/05/2023 04:44 PM   Modules accepted: Orders

## 2023-07-05 NOTE — Telephone Encounter (Addendum)
ERx tramadol to take 1/2-1 tab BID PRN moderate pain.  Caution with sedation precautions, also may cause constipation. Let us know how he does on this medicine. Start at 1/2 tablet first to ensure tolerating well without side effects prior to taking full dose.  Looks like Dr Darrelyn Hillock last filled tramadol Rx early Sept.

## 2023-07-05 NOTE — Telephone Encounter (Signed)
Lvm asking pt to call back.  Need to relay Dr. Timoteo Expose message.

## 2023-07-05 NOTE — Telephone Encounter (Signed)
Pt requested a call back @ [phone].

## 2023-07-05 NOTE — Telephone Encounter (Signed)
Pt rtn call. I relayed Dr Timoteo Expose message. Pt verbalizes understanding.  States he no longer takes Celebrex and agrees to try tramadol.  Updated pt's med list.

## 2023-07-06 NOTE — Telephone Encounter (Signed)
Spoke with pt relaying Dr. Timoteo Expose message.  Pt verbalizes understanding and expresses his thanks.

## 2023-07-13 ENCOUNTER — Other Ambulatory Visit: Payer: Self-pay | Admitting: Family Medicine

## 2023-07-14 NOTE — Telephone Encounter (Signed)
ERx 

## 2023-07-18 ENCOUNTER — Other Ambulatory Visit: Payer: Self-pay | Admitting: Family Medicine

## 2023-07-18 DIAGNOSIS — E1169 Type 2 diabetes mellitus with other specified complication: Secondary | ICD-10-CM

## 2023-07-18 DIAGNOSIS — N1832 Chronic kidney disease, stage 3b: Secondary | ICD-10-CM

## 2023-07-18 DIAGNOSIS — E538 Deficiency of other specified B group vitamins: Secondary | ICD-10-CM

## 2023-07-18 DIAGNOSIS — E1121 Type 2 diabetes mellitus with diabetic nephropathy: Secondary | ICD-10-CM

## 2023-07-19 ENCOUNTER — Encounter: Payer: Self-pay | Admitting: Family Medicine

## 2023-07-19 ENCOUNTER — Ambulatory Visit (INDEPENDENT_AMBULATORY_CARE_PROVIDER_SITE_OTHER): Payer: PPO | Admitting: Family Medicine

## 2023-07-19 ENCOUNTER — Ambulatory Visit: Payer: PPO | Admitting: Cardiovascular Disease

## 2023-07-19 VITALS — BP 110/78 | HR 99 | Temp 98.2°F | Ht 74.0 in | Wt 232.0 lb

## 2023-07-19 DIAGNOSIS — U071 COVID-19: Secondary | ICD-10-CM

## 2023-07-19 HISTORY — DX: COVID-19: U07.1

## 2023-07-19 MED ORDER — NIRMATRELVIR/RITONAVIR (PAXLOVID) TABLET (RENAL DOSING): 2.0000 | ORAL_TABLET | Freq: Two times a day (BID) | ORAL | 0 refills | Status: AC

## 2023-07-19 NOTE — Progress Notes (Signed)
Patient has had body aches, ST, HA's, chills, cough and fatigue since Friday. Patient tested positive for covid yesterday. Has been taking benadryl, tylenol and tramadol. Sore from cough, pain pain with sneeze.  No sputum.  Can still take a deep breath.  Cough got better last night.  Taking tramadol at baseline for hip pain, along with tylenol.    Meds, vitals, and allergies reviewed.   ROS: Per HPI unless specifically indicated in ROS section   GEN: nad, alert and oriented HEENT: ncat NECK: supple w/o LA CV: rrr.  PULM: ctab, no inc wob ABD: soft, +bs, upper abd wall sore with cough.   EXT: no edema SKIN: no acute rash, well perfused.  Chronic changes RUE

## 2023-07-19 NOTE — Assessment & Plan Note (Signed)
With abd wall sore from cough.  D/w pt about options. Okay for outpatient f/u.  Start paxlovid (renal dose) and try taking 1 tramadol up to 3 times per day if needed.  Sedation caution on tramadol.  Rest and fluids.  Update [place] as needed. He agrees with plan.

## 2023-07-19 NOTE — Patient Instructions (Signed)
Start paxlovid and try taking 1 tramadol up to 3 times per day if needed.   Rest and fluids.  Update [place] as needed.  Take care.  Glad to see you.

## 2023-07-23 ENCOUNTER — Other Ambulatory Visit: Payer: PPO

## 2023-07-23 DIAGNOSIS — E1169 Type 2 diabetes mellitus with other specified complication: Secondary | ICD-10-CM

## 2023-07-23 DIAGNOSIS — E785 Hyperlipidemia, unspecified: Secondary | ICD-10-CM | POA: Diagnosis not present

## 2023-07-23 DIAGNOSIS — E538 Deficiency of other specified B group vitamins: Secondary | ICD-10-CM

## 2023-07-23 DIAGNOSIS — E1121 Type 2 diabetes mellitus with diabetic nephropathy: Secondary | ICD-10-CM | POA: Diagnosis not present

## 2023-07-23 DIAGNOSIS — N1832 Chronic kidney disease, stage 3b: Secondary | ICD-10-CM

## 2023-07-23 LAB — LIPID PANEL
Cholesterol: 153 mg/dL (ref 0–200)
HDL: 44.2 mg/dL (ref >39.00)
LDL Cholesterol: 91 mg/dL (ref 0–99)
NonHDL: 108.67
Total CHOL/HDL Ratio: 3
Triglycerides: 87 mg/dL (ref 0.0–149.0)
VLDL: 17.4 mg/dL (ref 0.0–40.0)

## 2023-07-23 LAB — CBC WITH DIFFERENTIAL/PLATELET
Basophils Absolute: 0 10*3/uL (ref 0.0–0.1)
Basophils Relative: 0.2 % (ref 0.0–3.0)
Eosinophils Absolute: 0 10*3/uL (ref 0.0–0.7)
Eosinophils Relative: 0 % (ref 0.0–5.0)
HCT: 43.1 % (ref 39.0–52.0)
Hemoglobin: 13.9 g/dL (ref 13.0–17.0)
Lymphocytes Relative: 21.3 % (ref 12.0–46.0)
Lymphs Abs: 1.7 10*3/uL (ref 0.7–4.0)
MCHC: 32.4 g/dL (ref 30.0–36.0)
MCV: 99.4 fL (ref 78.0–100.0)
Monocytes Absolute: 0.6 10*3/uL (ref 0.1–1.0)
Monocytes Relative: 7 % (ref 3.0–12.0)
Neutro Abs: 5.8 10*3/uL (ref 1.4–7.7)
Neutrophils Relative %: 71.5 % (ref 43.0–77.0)
Platelets: 249 10*3/uL (ref 150.0–400.0)
RBC: 4.33 Mil/uL (ref 4.22–5.81)
RDW: 14.8 % (ref 11.5–15.5)
WBC: 8.1 10*3/uL (ref 4.0–10.5)

## 2023-07-23 LAB — VITAMIN D 25 HYDROXY (VIT D DEFICIENCY, FRACTURES): VITD: 33.14 ng/mL (ref 30.00–100.00)

## 2023-07-23 LAB — COMPREHENSIVE METABOLIC PANEL
ALT: 13 U/L (ref 0–53)
AST: 12 U/L (ref 0–37)
Albumin: 3.7 g/dL (ref 3.5–5.2)
Alkaline Phosphatase: 27 U/L — ABNORMAL LOW (ref 39–117)
BUN: 34 mg/dL — ABNORMAL HIGH (ref 6–23)
CO2: 30 meq/L (ref 19–32)
Calcium: 9 mg/dL (ref 8.4–10.5)
Chloride: 104 meq/L (ref 96–112)
Creatinine, Ser: 2.1 mg/dL — ABNORMAL HIGH (ref 0.40–1.50)
GFR: 28.73 mL/min — ABNORMAL LOW (ref >60.00)
Glucose, Bld: 103 mg/dL — ABNORMAL HIGH (ref 70–99)
Potassium: 4.1 meq/L (ref 3.5–5.1)
Sodium: 142 meq/L (ref 135–145)
Total Bilirubin: 0.5 mg/dL (ref 0.2–1.2)
Total Protein: 5.4 g/dL — ABNORMAL LOW (ref 6.0–8.3)

## 2023-07-23 LAB — PHOSPHORUS: Phosphorus: 3.7 mg/dL (ref 2.3–4.6)

## 2023-07-23 LAB — VITAMIN B12: Vitamin B-12: 271 pg/mL (ref 211–911)

## 2023-07-23 LAB — HEMOGLOBIN A1C: Hgb A1c MFr Bld: 6.6 % — ABNORMAL HIGH (ref 4.6–6.5)

## 2023-07-26 LAB — EXTRA SPECIMEN

## 2023-07-26 LAB — PARATHYROID HORMONE, INTACT (NO CA)

## 2023-07-27 ENCOUNTER — Other Ambulatory Visit: Payer: Self-pay | Admitting: Family Medicine

## 2023-07-27 DIAGNOSIS — N1832 Chronic kidney disease, stage 3b: Secondary | ICD-10-CM

## 2023-07-27 DIAGNOSIS — R600 Localized edema: Secondary | ICD-10-CM

## 2023-07-30 ENCOUNTER — Encounter: Payer: Self-pay | Admitting: Family Medicine

## 2023-07-30 ENCOUNTER — Ambulatory Visit (INDEPENDENT_AMBULATORY_CARE_PROVIDER_SITE_OTHER): Payer: PPO | Admitting: Family Medicine

## 2023-07-30 VITALS — BP 134/64 | HR 99 | Temp 98.5°F | Ht 71.75 in | Wt 238.4 lb

## 2023-07-30 DIAGNOSIS — K219 Gastro-esophageal reflux disease without esophagitis: Secondary | ICD-10-CM | POA: Diagnosis not present

## 2023-07-30 DIAGNOSIS — E785 Hyperlipidemia, unspecified: Secondary | ICD-10-CM | POA: Diagnosis not present

## 2023-07-30 DIAGNOSIS — E66811 Obesity, class 1: Secondary | ICD-10-CM

## 2023-07-30 DIAGNOSIS — H61002 Unspecified perichondritis of left external ear: Secondary | ICD-10-CM | POA: Diagnosis not present

## 2023-07-30 DIAGNOSIS — I714 Abdominal aortic aneurysm, without rupture, unspecified: Secondary | ICD-10-CM | POA: Diagnosis not present

## 2023-07-30 DIAGNOSIS — I25118 Atherosclerotic heart disease of native coronary artery with other forms of angina pectoris: Secondary | ICD-10-CM

## 2023-07-30 DIAGNOSIS — N1832 Chronic kidney disease, stage 3b: Secondary | ICD-10-CM | POA: Diagnosis not present

## 2023-07-30 DIAGNOSIS — I10 Essential (primary) hypertension: Secondary | ICD-10-CM | POA: Diagnosis not present

## 2023-07-30 DIAGNOSIS — E1121 Type 2 diabetes mellitus with diabetic nephropathy: Secondary | ICD-10-CM

## 2023-07-30 DIAGNOSIS — Z0001 Encounter for general adult medical examination with abnormal findings: Secondary | ICD-10-CM | POA: Diagnosis not present

## 2023-07-30 DIAGNOSIS — E538 Deficiency of other specified B group vitamins: Secondary | ICD-10-CM | POA: Diagnosis not present

## 2023-07-30 DIAGNOSIS — R600 Localized edema: Secondary | ICD-10-CM

## 2023-07-30 DIAGNOSIS — Z7189 Other specified counseling: Secondary | ICD-10-CM | POA: Diagnosis not present

## 2023-07-30 DIAGNOSIS — K760 Fatty (change of) liver, not elsewhere classified: Secondary | ICD-10-CM

## 2023-07-30 DIAGNOSIS — Z23 Encounter for immunization: Secondary | ICD-10-CM

## 2023-07-30 DIAGNOSIS — E1169 Type 2 diabetes mellitus with other specified complication: Secondary | ICD-10-CM

## 2023-07-30 MED ORDER — VITAMIN C 1000 MG PO TABS: 1000.0000 mg | ORAL_TABLET | Freq: Every day | ORAL | Status: AC

## 2023-07-30 MED ORDER — VITAMIN B-12 1000 MCG PO TABS: 1000.0000 ug | ORAL_TABLET | ORAL | Status: DC

## 2023-07-30 NOTE — Assessment & Plan Note (Addendum)
Chronic, stable, diet controlled.  Non-microalbuminuric kidney disease.  Now off ramipril.  Consider SGLT2i

## 2023-07-30 NOTE — Assessment & Plan Note (Signed)
Advanced directives: doesn't have set up. Full code but wouldn't want prolonged life support. Would want oldest daughter Terrial Rhodes RN) to be [employer]. Packet previously provided.

## 2023-07-30 NOTE — Progress Notes (Signed)
Ph: [phone] Fax: [phone]   Patient ID: Luis Sweeney, male    DOB: [birth year], 82 y.o.   MRN: [id number]  This visit was conducted in person.  BP 134/64 (BP Location: Right Arm, Cuff Size: Large)   Pulse 99   Temp 98.5 F (36.9 C) (Oral)   Ht 5' 11.75" (1.822 m)   Wt 238 lb 6 oz (108.1 kg)   SpO2 99%   BMI 32.56 kg/m   BP Readings from Last 3 Encounters:  07/30/23 134/64  07/19/23 110/78  04/30/23 134/82    CC: CPE Subjective:   HPI: Luis Sweeney is a 82 y.o. male presenting on 07/30/2023 for Annual Exam (MCR prt 2 [AWV- 03/25/23].)   Saw health advisor 03/2023 for medicare wellness visit. Note reviewed.    No results found.  Flowsheet Row Office Visit from 07/19/2023 in [facility] [place]  PHQ-2 Total Score 0          07/19/2023   10:43 AM 03/25/2023   11:17 AM 07/22/2022    2:33 PM 03/23/2022    1:57 PM 03/20/2021    1:25 PM  Fall Risk   Falls in the past year? 0 0 0 0 0  Number falls in past yr: 0 0 0 0 0  Injury with Fall? 0 0 0 0 0  Risk for fall due to : No Fall Risks No Fall Risks No Fall Risks No Fall Risks Medication side effect  Follow up Falls evaluation completed Falls prevention discussed;Falls evaluation completed Falls evaluation completed Falls evaluation completed Falls evaluation completed;Falls prevention discussed   Recent COVID infection 11d ago - treated with Paxlovid - did not tolerate this due to diarrhea. COVID symptoms did improve. Saw Dr Para March.   Worsening pedal edema - in setting of CKD. Started on lasix 20mg  PRN with benefit - takes 3x/wk. He stopped celebrex (through ortho). Has not recently taken tramadol or tylenol - R hip is feeling better, found to have torn tendon (Ortho [facility]io[facility]).  CTA chest/abd/pelvis showed extensive coronary artery disease.  Nuclear stress test 06/2022 - no significant ischemia, low risk scan.  Echocardiogram 04/2022 - EF 45-50%, mildly decreased EF, mild anterolateral  hypokinesis.  Previously on ramipril - this was stopped last year due to worsening CKD  Found to have 2.7cm AAA 06/2022 - rec rpt 5 yrs.   Preventative: ESOPHAGOGASTRODUODENOSCOPY Date: 10//2015 duodenal stricture, ?recurrent ulcer [facility])  COLONOSCOPY Date: 07/2014 severe diverticulosis, no rpt needed [facility]) Prostate cancer screening - no problems in past. Nocturia x1 per night. Strong stream. Will age out.  Lung cancer screening - not eligible  Flu - declines  COVID vaccine - completed Moderna 12/2019  Pneumovax 2004, 2009. Prevnar-13 2016  Td - 2002, 05/2012  Shingrix - discussed, declines  Advanced directives: doesn't have set up. Full code but wouldn't want prolonged life support. Would want oldest daughter Terrial Rhodes RN) to be [employer]. Packet previously provided.  Seat belt use discussed  Sunscreen use discussed. No changing moles on skin. Tender firm area to left ear - this is side he sleeps on. 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 Interim medical history since our last visit reviewed. Allergies and medications reviewed and updated. Outpatient Medications Prior to Visit  Medication Sig Dispense Refill   aspirin EC 81 MG tablet Take 81 mg by mouth daily.     furosemide (LASIX) 40 MG tablet TAKE ONE TABLET (40 MG TOTAL) BY MOUTH DAILY AS NEEDED FOR FLUID OR EDEMA. NEEDS ANNUAL PHYSICAL APPOINTMENT FOR ADDITIONAL REFILLS 30 tablet 0   potassium chloride (KLOR-CON) 10 MEQ tablet TAKE ONE TABLET (10 MEQ TOTAL) BY MOUTH DAILY AS  NEEDED WITH LASIX. NEEDS ANNUAL PHYSICAL APPOINTMENT FOR ADDITIONAL REFILLS 30 tablet 0   traMADol (ULTRAM) 50 MG tablet Take 1 tablet (50 mg total) by mouth 2 (two) times daily as needed for moderate pain. 30 tablet 0   Ascorbic Acid (VITAMIN C) 1000 MG tablet Take 1,000 mg by mouth daily. Takes 2 tablets daily     pantoprazole (PROTONIX) 20 MG tablet Take 20 mg by mouth daily.     pantoprazole (PROTONIX) 20 MG tablet Take 1 tablet (20 mg total) by mouth daily as needed.     No facility-administered medications prior to visit.     Per HPI unless specifically indicated in ROS section below Review of Systems  Constitutional:  Positive for fever (covid related). Negative for activity change, appetite change, chills, fatigue and unexpected weight change.  HENT:  Negative for hearing loss.   Eyes:  Negative for visual disturbance.  Respiratory:  Positive for cough (covid related) and shortness of breath (exertional). Negative for chest tightness and wheezing.   Cardiovascular:  Negative for chest pain, palpitations and leg swelling.  Gastrointestinal:  Negative for abdominal distention, abdominal pain, blood in stool, constipation, diarrhea, nausea and vomiting.  Genitourinary:  Negative for difficulty urinating and hematuria.  Musculoskeletal:  Negative for arthralgias, myalgias and neck pain.  Skin:  Negative for rash.  Neurological:  Positive for headaches (covid related). Negative for dizziness, seizures and syncope.  Hematological:  Negative for adenopathy. Bruises/bleeds easily.  Psychiatric/Behavioral:  Negative for dysphoric mood. The patient is not nervous/anxious.     Objective:  BP 134/64 (BP Location: Right Arm, Cuff Size: Large)   Pulse 99   Temp 98.5 F (36.9 C) (Oral)   Ht 5' 11.75" (1.822 m)   Wt 238 lb 6 oz (108.1 kg)   SpO2 99%   BMI 32.56 kg/m   Wt Readings from Last 3 Encounters:  07/30/23 238 lb 6 oz (108.1 kg)  07/19/23 232 lb (105.2 kg)  04/30/23 241 lb 2 oz  (109.4 kg)      Physical Exam Vitals and nursing note reviewed.  Constitutional:      General: He is not in acute distress.    Appearance: Normal appearance. He is well-developed. He is not ill-appearing.  HENT:     Head: Normocephalic and atraumatic.     Right Ear: Hearing, tympanic membrane, ear canal and external ear normal.     Left Ear: Hearing, tympanic membrane, ear canal and external ear normal.     Mouth/Throat:     Mouth: Mucous membranes are moist.     Pharynx: Oropharynx is clear. No oropharyngeal exudate or posterior oropharyngeal erythema.  Eyes:     General: No scleral icterus.    Extraocular Movements: Extraocular movements intact.     Conjunctiva/sclera: Conjunctivae normal.     Pupils: Pupils are  equal, round, and reactive to light.  Neck:     Thyroid: No thyroid mass or thyromegaly.     Vascular: No carotid bruit.  Cardiovascular:     Rate and Rhythm: Normal rate and regular rhythm.     Pulses: Normal pulses.          Radial pulses are 2+ on the right side and 2+ on the left side.     Heart sounds: Normal heart sounds. No murmur heard. Pulmonary:     Effort: Pulmonary effort is normal. No respiratory distress.     Breath sounds: Normal breath sounds. No wheezing, rhonchi or rales.  Abdominal:     General: Bowel sounds are normal. There is no distension.     Palpations: Abdomen is soft. There is no mass.     Tenderness: There is no abdominal tenderness. There is no guarding or rebound.     Hernia: No hernia is present.  Musculoskeletal:        General: Normal range of motion.     Cervical back: Normal range of motion and neck supple.     Right lower leg: No edema.     Left lower leg: No edema.     Comments: Chronic R erb's palsy   Lymphadenopathy:     Cervical: No cervical adenopathy.  Skin:    General: Skin is warm and dry.     Findings: No rash.  Neurological:     General: No focal deficit present.     Mental Status: He is alert and oriented to  person, place, and time.  Psychiatric:        Mood and Affect: Mood normal.        Behavior: Behavior normal.        Thought Content: Thought content normal.        Judgment: Judgment normal.       Results for orders placed or performed in visit on 07/23/23  Hemoglobin A1c  Result Value Ref Range   Hgb A1c MFr Bld 6.6 (H) 4.6 - 6.5 %  Vitamin B12  Result Value Ref Range   Vitamin B-12 271 211 - 911 pg/mL  CBC with Differential/Platelet  Result Value Ref Range   WBC 8.1 4.0 - 10.5 K/uL   RBC 4.33 4.22 - 5.81 Mil/uL   Hemoglobin 13.9 13.0 - 17.0 g/dL   HCT 40.9 81.1 - 91.4 %   MCV 99.4 78.0 - 100.0 fl   MCHC 32.4 30.0 - 36.0 g/dL   RDW 78.2 95.6 - 21.3 %   Platelets 249.0 150.0 - 400.0 K/uL   Neutrophils Relative % 71.5 43.0 - 77.0 %   Lymphocytes Relative 21.3 12.0 - 46.0 %   Monocytes Relative 7.0 3.0 - 12.0 %   Eosinophils Relative 0.0 0.0 - 5.0 %   Basophils Relative 0.2 0.0 - 3.0 %   Neutro Abs 5.8 1.4 - 7.7 K/uL   Lymphs Abs 1.7 0.7 - 4.0 K/uL   Monocytes Absolute 0.6 0.1 - 1.0 K/uL   Eosinophils Absolute 0.0 0.0 - 0.7 K/uL   Basophils Absolute 0.0 0.0 - 0.1 K/uL  Parathyroid hormone, intact (no Ca)  Result Value Ref Range   PTH CANCELED   VITAMIN D 25 Hydroxy (Vit-D Deficiency, Fractures)  Result Value Ref Range   VITD 33.14 30.00 - 100.00 ng/mL  Phosphorus  Result Value Ref Range   Phosphorus 3.7 2.3 - 4.6 mg/dL  Comprehensive metabolic panel  Result Value Ref Range   Sodium 142  135 - 145 mEq/L   Potassium 4.1 3.5 - 5.1 mEq/L   Chloride 104 96 - 112 mEq/L   CO2 30 19 - 32 mEq/L   Glucose, Bld 103 (H) 70 - 99 mg/dL   BUN 34 (H) 6 - 23 mg/dL   Creatinine, Ser 1.61 (H) 0.40 - 1.50 mg/dL   Total Bilirubin 0.5 0.2 - 1.2 mg/dL   Alkaline Phosphatase 27 (L) 39 - 117 U/L   AST 12 0 - 37 U/L   ALT 13 0 - 53 U/L   Total Protein 5.4 (L) 6.0 - 8.3 g/dL   Albumin 3.7 3.5 - 5.2 g/dL   GFR 09.60 (L) >45.40 mL/min   Calcium 9.0 8.4 - 10.5 mg/dL  Lipid panel   Result Value Ref Range   Cholesterol 153 0 - 200 mg/dL   Triglycerides 98.1 0.0 - 149.0 mg/dL   HDL 19.14 >78.29 mg/dL   VLDL 56.2 0.0 - 13.0 mg/dL   LDL Cholesterol 91 0 - 99 mg/dL   Total CHOL/HDL Ratio 3    NonHDL 108.67   Extra Specimen  Result Value Ref Range   Extra tube recieved     Specimen type recieved SST    PTH - 33 (2018)  Assessment & Plan:   Problem List Items Addressed This Visit     Advanced care planning/counseling discussion (Chronic)    Advanced directives: doesn't have set up. Full code but wouldn't want prolonged life support. Would want oldest daughter Terrial Rhodes RN) to be [employer]. Packet previously provided.       Encounter for general adult medical examination with abnormal findings - Primary (Chronic)    Preventative protocols reviewed and updated unless pt declined. Discussed healthy diet and lifestyle.       Hyperlipidemia associated with type 2 diabetes mellitus (HCC)    Remains off statin. The ASCVD Risk score (Arnett DK, et al., 2019) failed to calculate for the following reasons:   The 2019 ASCVD risk score is only valid for ages 40 to 43   The patient has a prior MI or stroke diagnosis       Coronary artery disease of native artery of native heart with stable angina pectoris (HCC)    Continue aspirin, consider statin.       GERD    Chronic, stable on low dose ppi PRN      Relevant Medications   pantoprazole (PROTONIX) 20 MG tablet   Chronic kidney disease, stage 3b (HCC)    Chronic, deteriorated control with latest GFR 29. Reviewing chart, GFR 20-30s over the past few years.  Recent PTH cancelled by lab (frozen specimen not received).  Reassess control at 3 mo f/u visit along with SPEP and PTH.       Relevant Orders   Renal function panel   Serum protein electrophoresis with reflex   ERB'S PALSY   Vitamin B12 deficiency    Levels dropping - will restart once weekly b12 replacement.       NAFLD (nonalcoholic fatty liver  disease)   Obesity, Class I, BMI 30-34.9   Type 2 diabetes with nephropathy (HCC)    Chronic, stable, diet controlled.  Non-microalbuminuric kidney disease.  Now off ramipril.  Consider SGLT2i       Essential hypertension    Chronic, improved on recheck, stable off medication.       AAA (abdominal aortic aneurysm) (HCC)    2.7cm AAA on imaging 06/2022 - rec rpt 5 yrs.  Pedal edema    Stable period on PRN lasix 20mg       Chondrodermatitis nodularis chronica helicis, left    Presumed dx, sleeps on this ear. Suggest foam vs other cushioning to ear.       Other Visit Diagnoses     Encounter for immunization       Relevant Orders   Flu Vaccine Trivalent High Dose (Fluad) (Completed)        Meds ordered this encounter  Medications   Ascorbic Acid (VITAMIN C) 1000 MG tablet    Sig: Take 1 tablet (1,000 mg total) by mouth daily.   cyanocobalamin (VITAMIN B12) 1000 MCG tablet    Sig: Take 1 tablet (1,000 mcg total) by mouth once a week.    Orders Placed This Encounter  Procedures   Flu Vaccine Trivalent High Dose (Fluad)   Renal function panel    Standing Status:   Future    Standing Expiration Date:   07/29/2024   Serum protein electrophoresis with reflex    Standing Status:   Future    Standing Expiration Date:   07/29/2024    Patient Instructions  Flu shot today  Start once weekly vitamin B12 Return in 3 months for follow up and labs to repeat kidney and parathyroid hormone levels.  Continue monitoring BP at home. Consider starting beta blocker for blood pressure - keep appointment with cardiology.   Follow up plan: Return in about 3 months (around 10/30/2023) for follow up visit.  Eustaquio Boyden, MD

## 2023-07-30 NOTE — Assessment & Plan Note (Signed)
2.7cm AAA on imaging 06/2022 - rec rpt 5 yrs.

## 2023-07-30 NOTE — Assessment & Plan Note (Signed)
Presumed dx, sleeps on this ear. Suggest foam vs other cushioning to ear.

## 2023-07-30 NOTE — Assessment & Plan Note (Signed)
Levels dropping - will restart once weekly b12 replacement.

## 2023-07-30 NOTE — Patient Instructions (Addendum)
Flu shot today  Start once weekly vitamin B12 Return in 3 months for follow up and labs to repeat kidney and parathyroid hormone levels.  Continue monitoring BP at home. Consider starting beta blocker for blood pressure - keep appointment with cardiology.

## 2023-07-30 NOTE — Assessment & Plan Note (Signed)
Chronic, stable on low dose ppi PRN

## 2023-07-30 NOTE — Assessment & Plan Note (Signed)
Remains off statin. The ASCVD Risk score (Arnett DK, et al., 2019) failed to calculate for the following reasons:   The 2019 ASCVD risk score is only valid for ages 73 to 21   The patient has a prior MI or stroke diagnosis

## 2023-07-30 NOTE — Assessment & Plan Note (Signed)
Preventative protocols reviewed and updated unless pt declined. Discussed healthy diet and lifestyle.  

## 2023-07-30 NOTE — Assessment & Plan Note (Signed)
Chronic, improved on recheck, stable off medication.

## 2023-07-30 NOTE — Assessment & Plan Note (Addendum)
Continue aspirin, consider statin.

## 2023-07-30 NOTE — Assessment & Plan Note (Addendum)
Chronic, deteriorated control with latest GFR 29. Reviewing chart, GFR 20-30s over the past few years.  Recent PTH cancelled by lab (frozen specimen not received).  Reassess control at 3 mo f/u visit along with SPEP and PTH.

## 2023-07-30 NOTE — Assessment & Plan Note (Signed)
Stable period on PRN lasix 20mg 

## 2023-08-06 DIAGNOSIS — H04123 Dry eye syndrome of bilateral lacrimal glands: Secondary | ICD-10-CM | POA: Diagnosis not present

## 2023-08-06 DIAGNOSIS — H0259 Other disorders affecting eyelid function: Secondary | ICD-10-CM | POA: Diagnosis not present

## 2023-08-10 ENCOUNTER — Ambulatory Visit: Payer: PPO | Admitting: Dermatology

## 2023-08-10 ENCOUNTER — Encounter: Payer: Self-pay | Admitting: Dermatology

## 2023-08-10 DIAGNOSIS — L821 Other seborrheic keratosis: Secondary | ICD-10-CM

## 2023-08-10 DIAGNOSIS — L578 Other skin changes due to chronic exposure to nonionizing radiation: Secondary | ICD-10-CM

## 2023-08-10 DIAGNOSIS — L570 Actinic keratosis: Secondary | ICD-10-CM | POA: Diagnosis not present

## 2023-08-10 DIAGNOSIS — W908XXA Exposure to other nonionizing radiation, initial encounter: Secondary | ICD-10-CM | POA: Diagnosis not present

## 2023-08-10 MED ORDER — HYDROCORTISONE 2.5 % EX CREA: TOPICAL_CREAM | Freq: Two times a day (BID) | CUTANEOUS | 3 refills | Status: DC | PRN

## 2023-08-10 NOTE — Progress Notes (Signed)
   Follow-Up Visit   Subjective  NAME SEESE is a 82 y.o. male who presents for the following: Spot  The patient has spots, moles and lesions to be evaluated, some may be new or changing and the patient may have concern these could be cancer.  Complains of spots on forehead and left ear to be evaluated. Also c/o sensitive skin on right arm. Says water makes it sting. Present for a few months.    The following portions of the chart were reviewed this encounter and updated as appropriate: medications, allergies, medical history  Review of Systems:  No other skin or systemic complaints except as noted in HPI or Assessment and Plan.  Objective  Well appearing patient in no apparent distress; mood and affect are within normal limits.  A focused examination was performed of the following areas: Forehead and right arm   Relevant physical exam findings are noted in the Assessment and Plan.    Assessment & Plan   ACTINIC KERATOSIS Exam: Erythematous thin papules/macules with gritty scale on scalp face ears  Actinic keratoses are precancerous spots that appear secondary to cumulative UV radiation exposure/sun exposure over time. They are chronic with expected duration over 1 year. A portion of actinic keratoses will progress to squamous cell carcinoma of the skin. It is not possible to reliably predict which spots will progress to skin cancer and so treatment is recommended to prevent development of skin cancer.  Recommend daily broad spectrum sunscreen SPF 30+ to sun-exposed areas, reapply every 2 hours as needed.  Recommend staying in the shade or wearing long sleeves, sun glasses (UVA+UVB protection) and wide brim hats (4-inch brim around the entire circumference of the hat). Call for new or changing lesions.  Treatment Plan: Patient would like to leave untreated at this time  SEBORRHEIC KERATOSIS - Stuck-on, waxy, tan-brown papules and/or plaques on L ear - Benign-appearing -  Discussed benign etiology and prognosis. - Observe - Call for any changes  Chronic actinic dermatitis with report of sensitivity to water, flaring not at patient goal Exam: Severe solar elastosis of sun-exposed right arm and hand  Plan: - chronic, secondary to cumulative UV radiation exposure/sun exposure over time - diffuse scaly erythematous macules with underlying dyspigmentation - cover affected area when out in the sun - Recommend staying in the shade or wearing long sleeves, sun glasses (UVA+UVB protection) and wide brim hats (4-inch brim around the entire circumference of the hat). - Call for new or changing lesions.  - start Hydrocortisone Cream 2.5% to apply to sensitive areas BID PRN - offered follow up for condition. Patient prefers PRN  Return if symptoms worsen or fail to improve.  I, Germaine Pomfret, CMA, am acting as scribe for Elie Goody, MD.   Documentation: I have reviewed the above documentation for accuracy and completeness, and I agree with the above.  Elie Goody, MD

## 2023-08-27 ENCOUNTER — Other Ambulatory Visit: Payer: Self-pay | Admitting: Family Medicine

## 2023-08-27 DIAGNOSIS — R600 Localized edema: Secondary | ICD-10-CM

## 2023-08-30 ENCOUNTER — Encounter: Payer: Self-pay | Admitting: Physician Assistant

## 2023-08-30 ENCOUNTER — Ambulatory Visit: Payer: PPO | Attending: Cardiovascular Disease | Admitting: Physician Assistant

## 2023-08-30 VITALS — BP 120/60 | HR 88 | Ht 74.0 in | Wt 245.6 lb

## 2023-08-30 DIAGNOSIS — I25118 Atherosclerotic heart disease of native coronary artery with other forms of angina pectoris: Secondary | ICD-10-CM | POA: Diagnosis not present

## 2023-08-30 DIAGNOSIS — N1832 Chronic kidney disease, stage 3b: Secondary | ICD-10-CM

## 2023-08-30 DIAGNOSIS — E785 Hyperlipidemia, unspecified: Secondary | ICD-10-CM

## 2023-08-30 DIAGNOSIS — I10 Essential (primary) hypertension: Secondary | ICD-10-CM

## 2023-08-30 DIAGNOSIS — I255 Ischemic cardiomyopathy: Secondary | ICD-10-CM

## 2023-08-30 DIAGNOSIS — E1169 Type 2 diabetes mellitus with other specified complication: Secondary | ICD-10-CM | POA: Diagnosis not present

## 2023-08-30 NOTE — Assessment & Plan Note (Signed)
BP controlled. He is not on antihypertensive meds.

## 2023-08-30 NOTE — Assessment & Plan Note (Signed)
EF of 45-50% with anterolateral hypokinesis noted on echocardiogram in July 2023. No current symptoms of heart failure.  He has chronic leg edema related to venous insufficiency. - Continue current management   - Follow up in one year

## 2023-08-30 NOTE — Patient Instructions (Signed)
Medication Instructions:  REACH OUT TO YOUR PRIMARY CARE PROVIDER TO SEE IF IT IS SAFE FOR YOU TO RESTART LOVASTATIN  *If you need a refill on your cardiac medications before your next appointment, please call your pharmacy*   Lab Work: NONE ORDERED   Testing/Procedures: NONE ORDERED   Follow-Up: At [facility], you and your health needs are our priority.  As part of our continuing mission to provide you with exceptional heart care, we have created designated Provider Care Teams.  These Care Teams include your primary Cardiologist (physician) and Advanced Practice Providers (APPs -  Physician Assistants and Nurse Practitioners) who all work together to provide you with the care you need, when you need it.  We recommend signing up for the patient portal called "MyChart".  Sign up information is provided on this After Visit Summary.  MyChart is used to connect with patients for Virtual Visits (Telemedicine).  Patients are able to view lab/test results, encounter notes, upcoming appointments, etc.  Non-urgent messages can be sent to your provider as well.   To learn more about what you can do with MyChart, go to [email or web address].    Your next appointment:   12 month(s)  Provider:   Tonny Bollman, MD  or Tereso Newcomer, PA-C   Other Instructions

## 2023-08-30 NOTE — Assessment & Plan Note (Signed)
History of CAD with DES to the LAD in 2004. Patent LAD stent at cardiac catheterization in 2017. Nuclear stress test in July 2022 showed an inferior scar but no ischemia. Echocardiogram in July 2023 showed an EF of 45-50% with anterolateral hypokinesis. Currently asymptomatic without chest pain to suggest angina.  - Continue aspirin 81 mg daily   - Check with primary care to restart lovastatin   - Follow up in one year

## 2023-08-30 NOTE — Progress Notes (Signed)
Cardiology Office Note:    Date:  08/30/2023  ID:  Luis Sweeney, DOB [birth year], MRN [id number] PCP: Eustaquio Boyden, MD  [facility] Providers Cardiologist:  Tonny Bollman, MD Cardiology APP:  Beatrice Lecher, PA-C       Patient Profile:      Coronary artery disease S/p DES to LAD in 2004 [facility] 07/08/2016: LAD proximal stent patent with 30 ISR, ostial D1 90 (small, jailed); OM1 60; RCA normal Myoview 04/17/2021: Inferior scar, no ischemia, EF 46, intermediate risk Ischemic cardiomyopathy TTE 05/06/2021: EF 45-50, mild LVH, G1 DD, inferior and apical HK, normal RVSF, trivial MR, ascending aorta 38 mm TTE 05/05/2022: EF 45-50, anterolateral HK, normal RVSF, aorta without evidence of dilation Dilated abdominal aorta US 06/2022: 2.7 cm-follow-up 5 years recommended; common iliac arteries with ectasia Hypertension Hyperlipidemia Chronic kidney disease NASH Peptic ulcer disease Aortic atherosclerosis         History of Present Illness:  Discussed the use of AI scribe software for clinical note transcription with the patient, who gave verbal consent to proceed.  Luis Sweeney is a 82 y.o. male who returns for follow-up of CAD, ischemic cardiomyopathy.  He was last seen in November 2023 by Dr. Excell Seltzer.  The patient had been taken off of his ACE inhibitor secondary to worsening renal function and elevated potassium. The patient reports no new symptoms or changes in his health. He has been off cholesterol medication (lovastatin) due to concerns about kidney function. The patient's LDL cholesterol was 91 in October, and he is considering restarting lovastatin after consulting with his primary care doctor. The patient also reports occasional leg swelling, which he manages with as-needed Lasix. He has not experienced any chest symptoms, shortness of breath, or dizziness. However, he notes a decrease in physical strength and activity, attributing it to aging and lack of exercise.       ROS   See HPI     Studies Reviewed:   EKG Interpretation Date/Time:  Monday August 30 2023 15:22:33 EST Ventricular Rate:  88 PR Interval:  156 QRS Duration:  82 QT Interval:  366 QTC Calculation: 442 R Axis:   30  Text Interpretation: Normal sinus rhythm Septal infarct , age undetermined No significant change since last tracing Confirmed by Tereso Newcomer [phone]) on 08/30/2023 3:52:14 PM   Labs-chart review 07/23/2023: K+ 4.1, creatinine 2.10, GFR 28.73, ALT 13, Hgb 13.9 04/30/2023 TSH 2.07 Results         Risk Assessment/Calculations:             Physical Exam:   VS:  BP 120/60   Pulse 88   Ht 6\' 2"  (1.88 m)   Wt 245 lb 9.6 oz (111.4 kg)   SpO2 95%   BMI 31.53 kg/m    Wt Readings from Last 3 Encounters:  08/30/23 245 lb 9.6 oz (111.4 kg)  07/30/23 238 lb 6 oz (108.1 kg)  07/19/23 232 lb (105.2 kg)    Constitutional:      Appearance: Healthy appearance. Not in distress.  Neck:     Vascular: No JVR.  Pulmonary:     Breath sounds: Normal breath sounds. No wheezing. No rales.  Cardiovascular:     Normal rate. Regular rhythm.     Murmurs: There is no murmur.  Edema:    Peripheral edema present.    Pretibial: bilateral 1+ edema of the pretibial area. Abdominal:     Palpations: Abdomen is soft.  Assessment and Plan:   Assessment & Plan Coronary artery disease of native artery of native heart with stable angina pectoris (HCC) History of CAD with DES to the LAD in 2004. Patent LAD stent at cardiac catheterization in 2017. Nuclear stress test in July 2022 showed an inferior scar but no ischemia. Echocardiogram in July 2023 showed an EF of 45-50% with anterolateral hypokinesis. Currently asymptomatic without chest pain to suggest angina.  - Continue aspirin 81 mg daily   - Check with primary care to restart lovastatin   - Follow up in one year  Ischemic cardiomyopathy EF of 45-50% with anterolateral hypokinesis noted on echocardiogram in July 2023. No  current symptoms of heart failure.  He has chronic leg edema related to venous insufficiency. - Continue current management   - Follow up in one year  Essential hypertension BP controlled. He is not on antihypertensive meds.  Hyperlipidemia associated with type 2 diabetes mellitus (HCC) LDL cholesterol was 91 mg/dL in October 5427.  Goal less than 55 mg/dL.  History of intolerance to multiple statins due to myalgia.  He notes his PCP had him hold his lovastatin recently. - Check with primary care and restart lovastatin if no contraindications Chronic kidney disease, stage 3b (HCC) Followed by primary care. Recent SCr 2.1.      Dispo:  Return in about 1 year (around 08/29/2024) for Routine Follow Up, w/ Dr. Excell Seltzer.  Signed, Tereso Newcomer, PA-C

## 2023-08-30 NOTE — Assessment & Plan Note (Signed)
LDL cholesterol was 91 mg/dL in October 5784.  Goal less than 55 mg/dL.  History of intolerance to multiple statins due to myalgia.  He notes his PCP had him hold his lovastatin recently. - Check with primary care and restart lovastatin if no contraindications

## 2023-08-30 NOTE — Assessment & Plan Note (Signed)
Followed by primary care. Recent SCr 2.1.

## 2023-09-30 ENCOUNTER — Telehealth: Payer: Self-pay | Admitting: Family Medicine

## 2023-09-30 DIAGNOSIS — N1832 Chronic kidney disease, stage 3b: Secondary | ICD-10-CM

## 2023-09-30 DIAGNOSIS — E1121 Type 2 diabetes mellitus with diabetic nephropathy: Secondary | ICD-10-CM

## 2023-09-30 NOTE — Telephone Encounter (Signed)
Copied from CRM [id number]. Topic: Referral - Question >> Sep 30, 2023 12:19 PM Adele Barthel wrote: Reason for CRM: Pt needs referral from the office to schedule an appt with [facility]. He is wanting to be seen by Dr. Thedore Mins and was advised he would need referral to schedule appt. [facility] PH#[phone].

## 2023-10-04 ENCOUNTER — Encounter: Payer: Self-pay | Admitting: *Deleted

## 2023-10-04 NOTE — Telephone Encounter (Signed)
Spoke with Luis Sweeney relaying Dr Timoteo Expose message. Luis Sweeney expresses his thanks. However, states he was under the impression labs would be done with kidney doc. Luis Sweeney is asking should he have requested labs at [facility] and still see specialist. Plz advise.

## 2023-10-04 NOTE — Addendum Note (Signed)
Addended by: Eustaquio Boyden on: 10/04/2023 10:48 AM   Modules accepted: Orders

## 2023-10-04 NOTE — Telephone Encounter (Signed)
He can have labs done at kidney doctor's office but I'm not sure how long it will take to get in.  Ok to just wait until he sees nephrologist.

## 2023-10-04 NOTE — Telephone Encounter (Addendum)
Plz notify pt nephrology referral placed.  There was a test I wanted to check last time but frozen specimen not received by lab. Would offer he come in for nonfasting labs at his convenience for recheck kidney function plus parathyroid levels.

## 2023-10-05 NOTE — Telephone Encounter (Signed)
Lvm asking pt to call back.  Need to relay Dr. G's message.  

## 2023-10-07 NOTE — Telephone Encounter (Signed)
Patient called in returning call he received. Relayed message below to patient.

## 2023-10-07 NOTE — Telephone Encounter (Signed)
Lvm asking pt to call back.  Need to relay Dr. G's message.  

## 2023-10-28 ENCOUNTER — Other Ambulatory Visit: Payer: Self-pay | Admitting: Family Medicine

## 2023-10-28 DIAGNOSIS — M1611 Unilateral primary osteoarthritis, right hip: Secondary | ICD-10-CM

## 2023-10-28 NOTE — Telephone Encounter (Signed)
ERx 

## 2023-10-28 NOTE — Telephone Encounter (Signed)
Name of Medication:  Tramadol Name of Pharmacy:  [facility] Last Fill or Written Date and Quantity:  07/14/23, #30 Last Office Visit and Type:  07/30/23, CPE Next Office Visit and Type:  none Last Controlled Substance Agreement Date:  none Last UDS:  none

## 2023-11-24 DIAGNOSIS — R8290 Unspecified abnormal findings in urine: Secondary | ICD-10-CM | POA: Diagnosis not present

## 2023-11-24 DIAGNOSIS — I10 Essential (primary) hypertension: Secondary | ICD-10-CM | POA: Diagnosis not present

## 2023-11-24 DIAGNOSIS — N184 Chronic kidney disease, stage 4 (severe): Secondary | ICD-10-CM | POA: Diagnosis not present

## 2023-11-24 DIAGNOSIS — N189 Chronic kidney disease, unspecified: Secondary | ICD-10-CM | POA: Diagnosis not present

## 2023-11-24 DIAGNOSIS — R600 Localized edema: Secondary | ICD-10-CM | POA: Diagnosis not present

## 2023-11-24 DIAGNOSIS — I129 Hypertensive chronic kidney disease with stage 1 through stage 4 chronic kidney disease, or unspecified chronic kidney disease: Secondary | ICD-10-CM | POA: Diagnosis not present

## 2023-11-29 DIAGNOSIS — M25551 Pain in right hip: Secondary | ICD-10-CM | POA: Diagnosis not present

## 2024-03-22 DIAGNOSIS — R8290 Unspecified abnormal findings in urine: Secondary | ICD-10-CM | POA: Diagnosis not present

## 2024-03-22 DIAGNOSIS — R600 Localized edema: Secondary | ICD-10-CM | POA: Diagnosis not present

## 2024-03-22 DIAGNOSIS — I10 Essential (primary) hypertension: Secondary | ICD-10-CM | POA: Diagnosis not present

## 2024-03-22 DIAGNOSIS — N189 Chronic kidney disease, unspecified: Secondary | ICD-10-CM | POA: Diagnosis not present

## 2024-03-22 DIAGNOSIS — I129 Hypertensive chronic kidney disease with stage 1 through stage 4 chronic kidney disease, or unspecified chronic kidney disease: Secondary | ICD-10-CM | POA: Diagnosis not present

## 2024-03-22 DIAGNOSIS — N184 Chronic kidney disease, stage 4 (severe): Secondary | ICD-10-CM | POA: Diagnosis not present

## 2024-03-27 ENCOUNTER — Ambulatory Visit (INDEPENDENT_AMBULATORY_CARE_PROVIDER_SITE_OTHER)

## 2024-03-27 VITALS — Ht 74.0 in | Wt 245.0 lb

## 2024-03-27 DIAGNOSIS — Z0000 Encounter for general adult medical examination without abnormal findings: Secondary | ICD-10-CM | POA: Diagnosis not present

## 2024-03-27 NOTE — Patient Instructions (Signed)
 Mr. Luis Sweeney , Thank you for taking time out of your busy schedule to complete your Annual Wellness Visit with me. I enjoyed our conversation and look forward to speaking with you again next year. I, as well as your care team,  appreciate your ongoing commitment to your health goals. Please review the following plan we discussed and let me know if I can assist you in the future. Your Game plan/ To Do List    Follow up Visits: Next Medicare AWV with our clinical staff: 03/28/24 @ 11:30am televisit   Have you seen your provider in the last 6 months (3 months if uncontrolled diabetes)? No Next Office Visit with your provider: 08/01/24  Clinician Recommendations:  Aim for 30 minutes of exercise or brisk walking, 6-8 glasses of water , and 5 servings of fruits and vegetables each day.       This is a list of the screening recommended for you and due dates:  Health Maintenance  Topic Date Due   Zoster (Shingles) Vaccine (1 of 2) Never done   Eye exam for diabetics  03/27/2010   Complete foot exam   03/03/2018   COVID-19 Vaccine (3 - Moderna risk series) 02/05/2020   DTaP/Tdap/Td vaccine (3 - Tdap) 05/31/2022   Hemoglobin A1C  01/21/2024   Medicare Annual Wellness Visit  03/24/2024   Yearly kidney health urinalysis for diabetes  04/29/2024   Flu Shot  05/12/2024   Yearly kidney function blood test for diabetes  07/22/2024   Pneumococcal Vaccine for age over 96  Completed   HPV Vaccine  Aged Out   Meningitis B Vaccine  Aged Out    Advanced directives: (Declined) Advance directive discussed with you today. Even though you declined this today, please call our office should you change your mind, and we can give you the proper paperwork for you to fill out. Advance Care Planning is important because it:  [x]  Makes sure you receive the medical care that is consistent with your values, goals, and preferences  [x]  It provides guidance to your family and loved ones and reduces their decisional burden  about whether or not they are making the right decisions based on your wishes.  Follow the link provided in your after visit summary or read over the paperwork we have mailed to you to help you started getting your Advance Directives in place. If you need assistance in completing these, please reach out to us  so that we can help you!

## 2024-03-27 NOTE — Progress Notes (Signed)
 Subjective:   Luis Sweeney is a 83 y.o. who presents for a Medicare Wellness preventive visit.  As a reminder, Annual Wellness Visits don't include a physical exam, and some assessments may be limited, especially if this visit is performed virtually. We may recommend an in-person follow-up visit with your provider if needed.  Visit Complete: Virtual I connected with  Luis Sweeney on 03/27/24 by a audio enabled telemedicine application and verified that I am speaking with the correct person using two identifiers.  Patient Location: Other:  pt says he was in grocery store  Provider Location: Home Office  I discussed the limitations of evaluation and management by telemedicine. The patient expressed understanding and agreed to proceed.  Vital Signs: Because this visit was a virtual/telehealth visit, some criteria may be missing or patient reported. Any vitals not documented were not able to be obtained and vitals that have been documented are patient reported.  VideoDeclined- This patient declined Librarian, academic. Therefore the visit was completed with audio only.  Persons Participating in Visit: Patient.  AWV Questionnaire: No: Patient Medicare AWV questionnaire was not completed prior to this visit.  Cardiac Risk Factors include: advanced age (>32men, >45 women);diabetes mellitus;dyslipidemia;hypertension;male gender;obesity (BMI >30kg/m2);sedentary lifestyle     Objective:    Today's Vitals   03/27/24 1137  Weight: 245 lb (111.1 kg)  Height: 6' 2 (1.88 m)   Body mass index is 31.46 kg/m.     03/27/2024   11:46 AM 03/25/2023   11:21 AM 07/08/2022    9:38 PM 07/01/2022    6:48 PM 03/23/2022    1:56 PM 05/29/2021    7:00 AM 05/28/2021   11:44 PM  Advanced Directives  Does Patient Have a Medical Advance Directive? No No Yes Yes No No No  Type of Advance Directive   Healthcare Power of [employer] Power of Attorney     Does patient want  to make changes to medical advance directive?   No - Guardian declined      Would patient like information on creating a medical advance directive?  No - Patient declined   No - Patient declined No - Patient declined Yes (ED - Information included in AVS)    Current Medications (verified) Outpatient Encounter Medications as of 03/27/2024  Medication Sig   Ascorbic Acid (VITAMIN C ) 1000 MG tablet Take 1 tablet (1,000 mg total) by mouth daily.   aspirin  EC 81 MG tablet Take 81 mg by mouth daily.   cyanocobalamin  (VITAMIN B12) 1000 MCG tablet Take 1,000 mcg by mouth daily.   furosemide  (LASIX ) 40 MG tablet Take 1 tablet (40 mg total) by mouth daily as needed for fluid or edema.   hydrocortisone  2.5 % cream Apply topically 2 (two) times daily as needed (Rash).   Omega 3 1200 MG CAPS Take 1,200 mg by mouth daily.   pantoprazole  (PROTONIX ) 20 MG tablet Take 1 tablet (20 mg total) by mouth daily as needed.   potassium chloride  (KLOR-CON ) 10 MEQ tablet Take 1 tablet by mouth daily as needed when taking Lasix .   traMADol  (ULTRAM ) 50 MG tablet Take 1 tablet (50 mg total) by mouth 2 (two) times daily as needed. (Patient not taking: Reported on 03/27/2024)   No facility-administered encounter medications on file as of 03/27/2024.    Allergies (verified) Prednisone, Codeine, Lipitor [atorvastatin ], Morphine, and Simvastatin   History: Past Medical History:  Diagnosis Date   Acute duodenal ulcer with hemorrhage but without obstruction  required  bld. transfusion   Anemia, iron deficiency    Arthritis    OA- pt. reports its. minor   CAD (coronary artery disease)    a. a. 2004 PCI/DES LAD; b. 06/2016 Cath: LM nl, LAD patent stent, D1 jailed, 90p, LCX nl, OM1 small, nl, RCA nl->Med Rx; c. 04/2021 MV: EF 46%, apical inf infarct. No ischemia.   CKD (chronic kidney disease), stage III (HCC)    COVID 07/19/2023   COVID-19 virus infection 10/2020   Diverticulosis of colon    Duodenal stricture -  recurrent 09/21/2014   Erb's palsy    birth trauma, pt. reports he was 11lbs. + at birth    GERD (gastroesophageal reflux disease)    H/O hiatal hernia    Helicobacter pylori gastritis    recurrent   Hypercholesteremia    Hyperglycemia    Hypertension    Ischemic cardiomyopathy    a. 04/2022 Echo: EF 45-50%, mild antlat HK. Nl RV fxn.   Myocardial infarct (HCC)    NAFLD (nonalcoholic fatty liver disease) 2130   with liver cysts   Paralysis of upper limb (HCC)    R arm- birth injury   Personal history of colonic polyps    Small bowel obstruction (HCC)    Urticaria    Lat left arm   Past Surgical History:  Procedure Laterality Date   ANTERIOR CERVICAL DECOMP/DISCECTOMY FUSION  03/2013   C3/4, C4/5 HNP (kritzer)   ANTERIOR CERVICAL DECOMP/DISCECTOMY FUSION N/A 03/15/2013   Procedure: ANTERIOR CERVICAL DECOMPRESSION/DISCECTOMY FUSION 2 LEVELS;  Surgeon: Augustine Blocker, MD;  Location: [facility];  Service: Neurosurgery;  Laterality: N/A;  Cervical three-four, cervical four-five anterior cervical diskectomy fusion with a trabecular metal plus plate    CARDIAC CATHETERIZATION  2006   stent placed   CARDIAC CATHETERIZATION N/A 07/08/2016   Procedure: Left Heart Cath and Coronary Angiography;  Surgeon: Arnoldo Lapping, MD;  Location: [facility];  Service: Cardiovascular;  Laterality: N/A;   CATARACT EXTRACTION W/PHACO Right 11/04/2021   Procedure: CATARACT EXTRACTION PHACO AND INTRAOCULAR LENS PLACEMENT (IOC) RIGHT VIVITY LENS 7.38 00:56.1;  Surgeon: Clair Crews, MD;  Location: [facility];  Service: Ophthalmology;  Laterality: Right;   CATARACT EXTRACTION W/PHACO Left 11/18/2021   Procedure: CATARACT EXTRACTION PHACO AND INTRAOCULAR LENS PLACEMENT (IOC) LEFT VIVITY LENS 7.96 00:47.1;  Surgeon: Clair Crews, MD;  Location: [facility];  Service: Ophthalmology;  Laterality: Left;  wants late arrival   COLONOSCOPY  multiple   diverticulosis, int hem, h/o  adenomatous polyps Willy Harvest)   COLONOSCOPY  07/2014   severe diverticulosis, no rpt needed Willy Harvest)   CORONARY STENT PLACEMENT  2006   drug-eluting   ESOPHAGOGASTRODUODENOSCOPY  11/26/10   mild gastritis, duodenitis, duodenal stricture (Dr. Willy Harvest)   ESOPHAGOGASTRODUODENOSCOPY  10//2015   duodenal stricture, ?recurrent ulcer Willy Harvest)   HERNIA REPAIR Right 2006   Dr. Gaylyn Keas   KNEE SURGERY     left   SHOULDER SURGERY     right   spinal cyst removal     WRIST SURGERY     XI ROBOTIC ASSISTED INGUINAL HERNIA REPAIR WITH MESH Right 05/29/2021   Procedure: XI ROBOTIC ASSISTED INGUINAL HERNIA REPAIR WITH MESH;  Surgeon: Eldred Grego, MD;  Location: [facility];  Service: General;  Laterality: Right;   Family History  Problem Relation Age of Onset   Diabetes Father    Heart disease Father        CHF   Kidney disease Father  kidney failure   Heart failure Father    Diabetes Sister    Hypertension Sister    Cancer Sister        pelvic mass   Hypertension Sister    Arthritis Sister    Ovarian cancer Other    Social History   Socioeconomic History   Marital status: Married    Spouse name: Not on file   Number of children: 3   Years of education: Not on file   Highest education level: Not on file  Occupational History   Occupation: HT Production assistant, radio: [employer]    Comment: metal Fabrication/Stairs/Rails commerial  Tobacco Use   Smoking status: Former    Current packs/day: 0.00    Average packs/day: 1.5 packs/day for 3.0 years (4.5 ttl pk-yrs)    Types: Cigarettes    Start date: 02/16/1982    Quit date: 02/16/1985    Years since quitting: 39.1   Smokeless tobacco: Never  Vaping Use   Vaping status: Never Used  Substance and Sexual Activity   Alcohol use: Yes    Alcohol/week: 0.0 standard drinks of alcohol    Comment: two to three times a year   Drug use: No   Sexual activity: Never    Comment: last few years.  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   Social Drivers of Corporate investment banker Strain: Low Risk  (03/27/2024)   Overall Financial Resource Strain (CARDIA)    Difficulty of Paying Living Expenses: Not hard at all  Food Insecurity: No Food Insecurity (03/27/2024)   Hunger Vital Sign    Worried About Running Out of Food in the Last Year: Never true    Ran Out of Food in the Last Year: Never true  Transportation Needs: No Transportation Needs (03/27/2024)   PRAPARE - Administrator, Civil Service (Medical): No    Lack of Transportation (Non-Medical): No  Physical Activity: Inactive (03/27/2024)   Exercise Vital Sign    Days of Exercise per Week: 0 days    Minutes of Exercise per Session: 0 min  Stress: No Stress Concern Present (03/27/2024)   [employer] of Occupational Health - Occupational Stress Questionnaire    Feeling of Stress: Not at all  Social Connections: Moderately Integrated (03/27/2024)   Social Connection and Isolation Panel    Frequency of Communication with Friends and Family: More than three times a week    Frequency of Social Gatherings with Friends and Family: More than three times a week    Attends Religious Services: More than 4 times per year    Active Member of [employer] or Organizations: No    Attends Engineer, structural: Never    Marital Status: Married    Tobacco Counseling Counseling given: Not Answered    Clinical Intake:  Pre-visit preparation completed: Yes  Pain : No/denies pain     BMI - recorded: 31.46 Nutritional Status: BMI > 30  Obese Nutritional Risks: None Diabetes: Yes CBG done?: No Did pt. bring in CBG monitor from home?: No  Lab Results  Component  Value Date   HGBA1C  6.6 (H) 07/23/2023   HGBA1C 6.6 (H) 03/05/2023   HGBA1C 6.3 (H) 07/01/2022     How often do you need to have someone help you when you read instructions, pamphlets, or other written materials from your doctor or pharmacy?: 1 - Never  Interpreter Needed?: No  Comments: lives with wife Information entered by :: B.Aaisha Sliter,LPN   Activities of Daily Living     03/27/2024   11:46 AM  In your present state of health, do you have any difficulty performing the following activities:  Hearing? 0  Vision? 0  Difficulty concentrating or making decisions? 0  Walking or climbing stairs? 0  Dressing or bathing? 0  Doing errands, shopping? 0  Preparing Food and eating ? N  Using the Toilet? N  In the past six months, have you accidently leaked urine? N  Do you have problems with loss of bowel control? N  Managing your Medications? N  Managing your Finances? N  Housekeeping or managing your Housekeeping? N    Patient Care Team: Claire Crick, MD as PCP - General (Family Medicine) Arnoldo Lapping, MD as PCP - Cardiology (Cardiology) Dingeldein, Landon Pinion, MD as Consulting Physician (Ophthalmology) Arnoldo Lapping, MD as Consulting Physician (Cardiology) Tivis Forster, MD as Consulting Physician (Neurosurgery) Sherwood Donath as Physician Assistant (Cardiology) Jonathan Neighbor, [facility] (Inactive) as Pharmacist (Pharmacist)  I have updated your Care Teams any recent Medical Services you may have received from other providers in the past year.     Assessment:   This is a routine wellness examination for [place].  Hearing/Vision screen Hearing Screening - Comments:: Pt says his hearing is good Vision Screening - Comments:: Pt says his vision w/glasses [facility]-   Goals Addressed   None    Depression Screen     03/27/2024   11:43 AM 07/19/2023   10:43 AM 04/30/2023    1:59 PM 03/25/2023   11:20 AM 03/23/2022    1:53 PM 03/20/2021    1:25 PM  01/19/2020   11:18 AM  PHQ 2/9 Scores  PHQ - 2 Score 0 0 0 0 0 0 0  PHQ- 9 Score  0 1  0 0 0    Fall Risk     03/27/2024   11:39 AM 07/19/2023   10:43 AM 03/25/2023   11:17 AM 07/22/2022    2:33 PM 03/23/2022    1:57 PM  Fall Risk   Falls in the past year? 0 0 0 0 0  Number falls in past yr: 0 0 0 0 0  Injury with Fall? 0 0 0 0 0  Risk for fall due to : No Fall Risks No Fall Risks No Fall Risks No Fall Risks No Fall Risks  Follow up Education provided;Falls prevention discussed Falls evaluation completed Falls prevention discussed;Falls evaluation completed Falls evaluation completed  Falls evaluation completed      Data saved with a previous flowsheet row definition    MEDICARE RISK AT HOME:  Medicare Risk at Home Any stairs in or around the home?: Yes If so, are there any without handrails?: Yes Home free of loose throw rugs in walkways, pet beds, electrical cords, etc?: Yes Adequate lighting in your home to reduce risk of falls?: Yes Life alert?: No Use of a cane, walker or w/c?: No Grab bars in the bathroom?: Yes Shower chair or bench in shower?: Yes Elevated toilet seat or a handicapped toilet?: Yes  TIMED UP AND GO:  Was the test performed?  No  Cognitive Function: 6CIT completed    03/20/2021    1:26 PM 01/19/2020   11:20 AM 02/20/2016    8:40 AM  MMSE - Mini Mental State Exam  Not completed: Refused    Orientation to time  5 5   Orientation to Place  5 5   Registration  3 3   Attention/ Calculation  5 0   Recall  3 3   Language- name 2 objects   0   Language- repeat  1 1  Language- follow 3 step command   3   Language- read & follow direction   0   Write a sentence   0   Copy design   0   Total score   20      Data saved with a previous flowsheet row definition        03/27/2024   11:47 AM 03/25/2023   11:23 AM  6CIT Screen  What Year? 0 points 0 points  What month? 0 points 0 points  What time? 0 points 0 points  Count back from 20 0 points 0 points   Months in reverse 2 points 0 points  Repeat phrase 4 points 0 points  Total Score 6 points 0 points    Immunizations Immunization History  Administered Date(s) Administered   Fluad Trivalent(High Dose 65+) 07/30/2023   Influenza Whole 07/10/2008   Moderna Sars-Covid-2 Vaccination 12/11/2019, 01/08/2020   Pneumococcal Conjugate-13 02/20/2015   Pneumococcal Polysaccharide-23 10/12/2002, 07/10/2008   Td 11/13/2000, 05/31/2012    Screening Tests Health Maintenance  Topic Date Due   Zoster Vaccines- Shingrix (1 of 2) Never done   OPHTHALMOLOGY EXAM  03/27/2010   FOOT EXAM  03/03/2018   COVID-19 Vaccine (3 - Moderna risk series) 02/05/2020   DTaP/Tdap/Td (3 - Tdap) 05/31/2022   HEMOGLOBIN A1C  01/21/2024   Medicare Annual Wellness (AWV)  03/24/2024   Diabetic kidney evaluation - Urine ACR  04/29/2024   INFLUENZA VACCINE  05/12/2024   Diabetic kidney evaluation - eGFR measurement  07/22/2024   Pneumococcal Vaccine: 50+ Years  Completed   HPV VACCINES  Aged Out   Meningococcal B Vaccine  Aged Out    Health Maintenance  Health Maintenance Due  Topic Date Due   Zoster Vaccines- Shingrix (1 of 2) Never done   OPHTHALMOLOGY EXAM  03/27/2010   FOOT EXAM  03/03/2018   COVID-19 Vaccine (3 - Moderna risk series) 02/05/2020   DTaP/Tdap/Td (3 - Tdap) 05/31/2022   HEMOGLOBIN A1C  01/21/2024   Medicare Annual Wellness (AWV)  03/24/2024   Health Maintenance Items Addressed: None at this time  Additional Screening:  Vision Screening: Recommended annual ophthalmology exams for early detection of glaucoma and other disorders of the eye. Would you like a referral to an eye doctor? No    Dental Screening: Recommended annual dental exams for proper oral hygiene  Community Resource Referral / Chronic Care Management: CRR required this visit?  No   CCM required this visit?  Appt scheduled with PCP and Lab visiy   Plan:    I have personally reviewed and noted the following in the  patient's chart:   Medical and social history Use of alcohol, tobacco or illicit drugs  Current medications and supplements including opioid prescriptions. Patient is not currently taking opioid prescriptions. Functional ability and status Nutritional status Physical activity Advanced directives List of other physicians Hospitalizations, surgeries, and ER visits in previous 12 months Vitals Screenings to include cognitive, depression, and falls  Referrals and appointments  In addition, I have reviewed and discussed with patient certain preventive protocols, quality metrics, and best practice recommendations. A written personalized care plan for preventive services as well as general preventive health recommendations were provided to patient.   Nerissa Bannister, LPN   1/61/0960   After Visit Summary: (MyChart) Due to this being a telephonic visit, the after visit summary with patients personalized plan was offered to patient via MyChart   Notes: Nothing significant to report at this time.

## 2024-04-06 DIAGNOSIS — N1832 Chronic kidney disease, stage 3b: Secondary | ICD-10-CM | POA: Diagnosis not present

## 2024-04-06 DIAGNOSIS — R600 Localized edema: Secondary | ICD-10-CM | POA: Diagnosis not present

## 2024-04-06 DIAGNOSIS — I10 Essential (primary) hypertension: Secondary | ICD-10-CM | POA: Diagnosis not present

## 2024-04-06 DIAGNOSIS — I129 Hypertensive chronic kidney disease with stage 1 through stage 4 chronic kidney disease, or unspecified chronic kidney disease: Secondary | ICD-10-CM | POA: Diagnosis not present

## 2024-04-06 DIAGNOSIS — N189 Chronic kidney disease, unspecified: Secondary | ICD-10-CM | POA: Diagnosis not present

## 2024-04-06 NOTE — Progress Notes (Signed)
 Patient Name: Luis Sweeney, male   Patient DOB: [birth year] Date of Service: 04/06/2024  Patient MRN: [id number] Provider Creating Note: Saralee Stank, MD  [phone] Primary Care Physician: Rilla Baller, MD  [address] [place] [address] Additional Physicians/ Providers:   Chief Complaint   Chief Complaint  Patient presents with  . Follow-up    History of Present Illness Luis Sweeney is a 83 y.o. 1-White male with following medical problems: Coronary artery disease status post drug-eluting stent to LAD in 2004, left heart cath September 2017 Ischemic cardiomyopathy Hypertension Hyperlipidemia NASH Aortic atherosclerosis Pre-Diabetes Peptic ulcer ds in the past  Osteoarthritis Fingers Rt hand congenital defect (birth injury) h/o rt shoulder surgery  He is referred for evaluation of chronic kidney disease. Review of labs in Care Everywhere show that his most recent creatinine from 07/23/2023 is 2.1, GFR 28.7, hemoglobin 13.9, hemoglobin A1c 6.6%  Prior to this in August 2024, his creatinine was 1.81, GFR 34. He has known chronic kidney disease since at least 2012 when creatinine was in the 1.5-1.7 range with corresponding GFR of mid 40s.  Imaging: CT abdomen pelvis showed bilateral renal cortical atrophy.  Bladder unremarkable.  rt hip arthritis- Tramadol  and tylenol   Gross Hematuria: no Hx of Kidney Stone: passed 3 stones NSAIDs: no Family Hx of Kidney disease: iv contrast exposure: September 2023, June 2022 Smoking: Former. Quit 1986; smoked 26 yrs 2D echo-05/05/2022-LVEF 45 to 50%, mild anterolateral hypokinesis, grade 1 diastolic dysfunction Occupation-retired from [employer].  Had his own steel fabrication business. ===================================================================  Today he does not have any acute complaints. He has about 1+ lower extremity edema bilaterally.  He is taking furosemide  maybe once a week as needed.  It makes him  urinate too much.  Blood pressure today is 135/78 sitting and 149/76 standing.  Weight is unchanged since February 2025.  Review of labs from 03/22/2024 show creatinine of 1.71/GFR 39 which is comparable to creatinine of 1.67/GFR 40 from 11/24/2023. SPEP is negative for M spike.  Hemoglobin is in the normal range.  PTH/phosphorus/calcium  are in the normal range.  Kappa lambda ratio is normal.  Urine microalbumin to creatinine ratio is normal.   Medications   Current Outpatient Medications:  .  cholecalciferol  (D3) 50 MCG (2000 UT) tablet, Take 2,000 Units by mouth 1 (one) time each day, Disp: , Rfl:  .  predniSONE (DELTASONE) 10 MG tablet, Take 10 mg by mouth if needed, Disp: , Rfl:  .  acetaminophen  (TYLENOL ) 500 MG tablet, Take 500 mg by mouth if needed for mild pain, Disp: , Rfl:  .  ascorbic acid (VITAMIN C ) 1000 MG tablet, Take 1,000 mg by mouth 1 (one) time each day, Disp: , Rfl:  .  aspirin  (ST JOSEPH) 81 MG EC tablet, Take 81 mg by mouth 1 (one) time each day, Disp: , Rfl:  .  cyanocobalamin  (VITAMIN B-12) 1000 MCG tablet, Take 1,000 mcg by mouth 1 (one) time each day, Disp: , Rfl:  .  furosemide  (LASIX ) 40 MG tablet, Take 1 tablet (40 mg total) by mouth daily as needed for fluid or edema., Disp: , Rfl:  .  Omega 3 1200 MG capsule, Take 1,200 mg by mouth 1 (one) time each day, Disp: , Rfl:  .  pantoprazole  (PROTONIX ) 20 MG EC tablet, Take 1 tablet (20 mg total) by mouth daily as needed., Disp: , Rfl:  .  potassium chloride  10 MEQ CR tablet, Take 1 tablet by mouth daily as needed when  taking Lasix ., Disp: , Rfl:    Allergies Atorvastatin , Codeine, Morphine, and Simvastatin  Physical Exam  Vitals BP 149/76 (BP Location: Left upper arm, Patient Position: Standing)   Pulse 86   Temp 97.9 F   Wt 252 lb (114 kg)   SpO2 97%   BMI 32.35 kg/m  Physical Exam  General Appearance-well-appearing, no acute distress HEENT-moist oral mucous membranes, anicteric Pulmonary: Normal breathing  effort on room air next time cardiac-normal rate and rhythm Abdomen-nondistended, nontender Extremities-1+ pitting edema bilaterally, right arm and hand deformity Neuro-alert and oriented. Skin-no rashes  Laboratory Studies  Chemistry  Lab Units 03/22/24 1144 11/24/23 1421 11/24/23 1344 04/30/23 1441  SODIUM mmol/L 141 139  --  140  POTASSIUM mmol/L 4.2 5.0  --  4.5  CHLORIDE mmol/L 103 104  --  102  CO2 mmol/L 29 29  --  26  MAGNESIUM  mg/dL 2.0 2.1  --   --   CALCIUM  mg/dL 9.1 9.1  --  9  PHOSPHORUS mg/dL 2.6 3.0  --  3.7  PTH pg/mL 45 78*  --   --   URIC ACID mg/dL 3.5* 4.3  --   --   GLUCOSE mg/dL 891* 90  --  94  ALBUMIN g/dL 3.7*  3.8 4.3 30 mg/L 4  BUN mg/dL 29* 22  --  31*  CREATININE mg/dL 8.28* 8.32*  --  7.94*        No lab exists for component: IRON SATURATION, TRANSSATPER  CBC  Lab Units 03/22/24 1144 11/24/23 1421  WBC AUTO Thousand/uL 9.2 8.5  HEMOGLOBIN g/dL 86.3 86.1  HEMOGLOBIN URINE  NEGATIVE  --   HEMATOCRIT % 41.1 41.8  MCV fL 97.4 95.4  PLATELETS AUTO Thousand/uL 198 228    Urine  Lab Units 03/22/24 1144 11/24/23 1344  COLOR U  YELLOW  --   COLOR UA   --  Yellow  CLARITY UA   --  Clear  KETONES U MG/DL  NEGATIVE  --   KETONES UA   --  Negative  PH UA   --  5.5  UROBILINOGEN UA   --  1.0  ALB MG/G CREAT UR mg/g creat 2  --     Imaging and Other Studies  07/09/2022-CT angiography of chest abdomen pelvis Renals: Both renal arteries are patent without evidence of aneurysm, dissection, vasculitis, fibromuscular dysplasia or significant stenosis. ========================================     Orders Placed This Encounter  . CBC and Differential  . PTH, Intact  . Renal Function Panel  . Uric Acid  . Urinalysis with microscopic  . Urine Albumin / Creatinine Ratio  . Magnesium     Lab results  11/24/2023-office urinalysis-glucose negative, bilirubin negative, ketone negative, specific gravity 1.025, blood negative, pH 5.5,  protein negative, nitrate negative, leukocyte negative.  Urine microalbumin to creatinine ratio less than 30.  Urine microscopic exam-many hair, many epithelial cells, few muddy brown casts.   Impression/Recommendations   Patient is a 83 y.o. 1-White male   1. Chronic kidney disease due to hypertension   2. Chronic kidney disease stage 3B (HCC)   3. Essential hypertension   4. Edema of lower extremity     1.  Hypertensive chronic kidney disease stage 3b CKD risk factors include hypertension, IV contrast exposure, advanced age, aortic atherosclerosis Patient has known CKD at least since 2012.  Initially creatinine was in the 1.5-1.6 range with GFR in the mid 40s. He has had IV contrast exposure in 2022 and 2023. Most recent labs-03/22/2024-creatinine 1.7/GFR  39.  Stable compared to creatinine 1.67/GFR 40 from 11/24/2023. Plan: Continue aspirin , lovastatin  for cardiovascular risk reduction.  Patient had stopped taking lovastatin  but he will restart it. Previously we had started SGLT2 inhibitor [place] in February 2025.  Patient reports that it caused him to have groin pain and muscle cramps therefore he does not want to take it anymore.   2.  Hypertension with lower extremity edema Blood pressure and volume control is acceptable. Edema is currently managed with furosemide  40 mg p.o. daily as needed.   Weigh every morning and record weight.   Return in about 6 months (around 10/06/2024).   Saralee Stank, MD [facility] [address], [place] [address] Ph: [phone] Fax: [phone]  The following portions of the patient's chart were reviewed in this encounter and updated as appropriate:  Tobacco  Allergies  Meds  Problems  Med Hx  Surg Hx  Fam Hx

## 2024-06-09 ENCOUNTER — Telehealth: Payer: Self-pay | Admitting: Family Medicine

## 2024-06-09 DIAGNOSIS — M84375S Stress fracture, left foot, sequela: Secondary | ICD-10-CM

## 2024-06-09 DIAGNOSIS — M79672 Pain in left foot: Secondary | ICD-10-CM | POA: Diagnosis not present

## 2024-06-09 NOTE — Telephone Encounter (Signed)
 Patient is dropped off a imaging order from [facility] for Bone density and would like to a location in [place] that is close to him placed document in provider box at front desk.

## 2024-06-09 NOTE — Addendum Note (Signed)
 Addended by: SEBASTIAN KNEE on: 06/09/2024 04:30 PM   Modules accepted: Orders

## 2024-06-09 NOTE — Telephone Encounter (Signed)
 Dr. KANDICE Bouillon pended a Dexa scan with instructions to schedule in [place].  I was not sure of the diagnosis to use.

## 2024-06-09 NOTE — Telephone Encounter (Signed)
 Imaging order from [facility] placed in Dr Talmadge in box. See note below.

## 2024-06-09 NOTE — Telephone Encounter (Signed)
 Copied from CRM [id number]. Topic: Referral - Request for Referral >> Jun 09, 2024  1:39 PM Donna BRAVO wrote: Did the patient discuss referral with their provider in the last year? No Orthopedic provider stated he has multiple stress fractures left foot and soft bones and recommends bone scan   Appointment offered? No  Type of order/referral and detailed reason for visit: multiple stress fractures left foot and soft bones and recommends bone scan  Preference of office, provider, location: patient would like to stay close to home. [place] is closer  If referral order, have you been seen by this specialty before? No  Can we respond through MyChart? Yes Would prefer phone call  Patient was informed  time frame  for new referral requests is 3 to 5 business days.

## 2024-06-14 DIAGNOSIS — L603 Nail dystrophy: Secondary | ICD-10-CM | POA: Diagnosis not present

## 2024-06-14 DIAGNOSIS — E1121 Type 2 diabetes mellitus with diabetic nephropathy: Secondary | ICD-10-CM | POA: Diagnosis not present

## 2024-06-14 DIAGNOSIS — M84375A Stress fracture, left foot, initial encounter for fracture: Secondary | ICD-10-CM | POA: Diagnosis not present

## 2024-06-14 NOTE — Addendum Note (Signed)
 Addended by: RILLA BALLER on: 06/14/2024 07:51 AM   Modules accepted: Orders

## 2024-06-14 NOTE — Telephone Encounter (Signed)
See other mychart message-addressed

## 2024-06-14 NOTE — Telephone Encounter (Signed)
 Informed pt to call [facility] to schedule an appt.

## 2024-06-14 NOTE — Telephone Encounter (Addendum)
 Ordered for left foot pain with stress fractures per Dr Heide order request.  Pt may call [facility] to schedule this at [phone]

## 2024-07-05 DIAGNOSIS — L603 Nail dystrophy: Secondary | ICD-10-CM | POA: Diagnosis not present

## 2024-07-05 DIAGNOSIS — M84375D Stress fracture, left foot, subsequent encounter for fracture with routine healing: Secondary | ICD-10-CM | POA: Diagnosis not present

## 2024-07-12 ENCOUNTER — Ambulatory Visit
Admission: RE | Admit: 2024-07-12 | Discharge: 2024-07-12 | Disposition: A | Discharge status: Home / Self Care | Source: Ambulatory Visit | Attending: Family Medicine | Admitting: Family Medicine

## 2024-07-12 DIAGNOSIS — M79672 Pain in left foot: Secondary | ICD-10-CM | POA: Diagnosis not present

## 2024-07-12 DIAGNOSIS — N189 Chronic kidney disease, unspecified: Secondary | ICD-10-CM | POA: Insufficient documentation

## 2024-07-12 DIAGNOSIS — M84375S Stress fracture, left foot, sequela: Secondary | ICD-10-CM | POA: Diagnosis not present

## 2024-07-12 DIAGNOSIS — X58XXXS Exposure to other specified factors, sequela: Secondary | ICD-10-CM | POA: Insufficient documentation

## 2024-07-17 ENCOUNTER — Ambulatory Visit: Payer: Self-pay | Admitting: Family Medicine

## 2024-07-23 ENCOUNTER — Other Ambulatory Visit: Payer: Self-pay | Admitting: Family Medicine

## 2024-07-23 DIAGNOSIS — N1832 Chronic kidney disease, stage 3b: Secondary | ICD-10-CM

## 2024-07-23 DIAGNOSIS — E1169 Type 2 diabetes mellitus with other specified complication: Secondary | ICD-10-CM

## 2024-07-23 DIAGNOSIS — E1121 Type 2 diabetes mellitus with diabetic nephropathy: Secondary | ICD-10-CM

## 2024-07-23 DIAGNOSIS — E538 Deficiency of other specified B group vitamins: Secondary | ICD-10-CM

## 2024-07-25 ENCOUNTER — Other Ambulatory Visit

## 2024-07-25 DIAGNOSIS — E1169 Type 2 diabetes mellitus with other specified complication: Secondary | ICD-10-CM

## 2024-07-25 DIAGNOSIS — E785 Hyperlipidemia, unspecified: Secondary | ICD-10-CM

## 2024-07-25 DIAGNOSIS — E1121 Type 2 diabetes mellitus with diabetic nephropathy: Secondary | ICD-10-CM

## 2024-07-25 DIAGNOSIS — E538 Deficiency of other specified B group vitamins: Secondary | ICD-10-CM

## 2024-07-25 DIAGNOSIS — N1832 Chronic kidney disease, stage 3b: Secondary | ICD-10-CM | POA: Diagnosis not present

## 2024-07-25 LAB — MICROALBUMIN / CREATININE URINE RATIO
Creatinine,U: 122.1 mg/dL
Microalb Creat Ratio: UNDETERMINED mg/g (ref 0.0–30.0)
Microalb, Ur: <0.7 mg/dL

## 2024-07-25 LAB — COMPREHENSIVE METABOLIC PANEL WITH GFR
ALT: 8 U/L (ref 0–53)
AST: 13 U/L (ref 0–37)
Albumin: 3.9 g/dL (ref 3.5–5.2)
Alkaline Phosphatase: 30 U/L — ABNORMAL LOW (ref 39–117)
BUN: 25 mg/dL — ABNORMAL HIGH (ref 6–23)
CO2: 31 meq/L (ref 19–32)
Calcium: 8.8 mg/dL (ref 8.4–10.5)
Chloride: 102 meq/L (ref 96–112)
Creatinine, Ser: 1.75 mg/dL — ABNORMAL HIGH (ref 0.40–1.50)
GFR: 35.51 mL/min — ABNORMAL LOW (ref >60.00)
Glucose, Bld: 107 mg/dL — ABNORMAL HIGH (ref 70–99)
Potassium: 4.3 meq/L (ref 3.5–5.1)
Sodium: 139 meq/L (ref 135–145)
Total Bilirubin: 0.6 mg/dL (ref 0.2–1.2)
Total Protein: 5.8 g/dL — ABNORMAL LOW (ref 6.0–8.3)

## 2024-07-25 LAB — CBC WITH DIFFERENTIAL/PLATELET
Basophils Absolute: 0 K/uL (ref 0.0–0.1)
Basophils Relative: 0.6 % (ref 0.0–3.0)
Eosinophils Absolute: 0.1 K/uL (ref 0.0–0.7)
Eosinophils Relative: 1.8 % (ref 0.0–5.0)
HCT: 41.7 % (ref 39.0–52.0)
Hemoglobin: 13.5 g/dL (ref 13.0–17.0)
Lymphocytes Relative: 24.2 % (ref 12.0–46.0)
Lymphs Abs: 1.5 K/uL (ref 0.7–4.0)
MCHC: 32.5 g/dL (ref 30.0–36.0)
MCV: 95.1 fl (ref 78.0–100.0)
Monocytes Absolute: 0.5 K/uL (ref 0.1–1.0)
Monocytes Relative: 7.7 % (ref 3.0–12.0)
Neutro Abs: 4.1 K/uL (ref 1.4–7.7)
Neutrophils Relative %: 65.7 % (ref 43.0–77.0)
Platelets: 224 K/uL (ref 150.0–400.0)
RBC: 4.38 Mil/uL (ref 4.22–5.81)
RDW: 14.3 % (ref 11.5–15.5)
WBC: 6.2 K/uL (ref 4.0–10.5)

## 2024-07-25 LAB — PHOSPHORUS: Phosphorus: 2.8 mg/dL (ref 2.3–4.6)

## 2024-07-25 LAB — LIPID PANEL
Cholesterol: 162 mg/dL (ref 0–200)
HDL: 44.2 mg/dL (ref >39.00)
LDL Cholesterol: 105 mg/dL — ABNORMAL HIGH (ref 0–99)
NonHDL: 117.36
Total CHOL/HDL Ratio: 4
Triglycerides: 61 mg/dL (ref 0.0–149.0)
VLDL: 12.2 mg/dL (ref 0.0–40.0)

## 2024-07-25 LAB — HEMOGLOBIN A1C: Hgb A1c MFr Bld: 6.6 % — ABNORMAL HIGH (ref 4.6–6.5)

## 2024-07-25 LAB — VITAMIN D 25 HYDROXY (VIT D DEFICIENCY, FRACTURES): VITD: 39.38 ng/mL (ref 30.00–100.00)

## 2024-07-25 LAB — VITAMIN B12: Vitamin B-12: 453 pg/mL (ref 211–911)

## 2024-07-26 LAB — PARATHYROID HORMONE, INTACT (NO CA): PTH: 20 pg/mL (ref 16–77)

## 2024-07-30 ENCOUNTER — Ambulatory Visit: Payer: Self-pay | Admitting: Family Medicine

## 2024-08-01 ENCOUNTER — Ambulatory Visit: Admitting: Family Medicine

## 2024-08-01 ENCOUNTER — Encounter: Payer: Self-pay | Admitting: Family Medicine

## 2024-08-01 VITALS — BP 124/64 | HR 81 | Temp 97.8°F | Ht 73.0 in | Wt 250.1 lb

## 2024-08-01 DIAGNOSIS — E538 Deficiency of other specified B group vitamins: Secondary | ICD-10-CM

## 2024-08-01 DIAGNOSIS — Z0000 Encounter for general adult medical examination without abnormal findings: Secondary | ICD-10-CM | POA: Diagnosis not present

## 2024-08-01 DIAGNOSIS — Z7189 Other specified counseling: Secondary | ICD-10-CM

## 2024-08-01 DIAGNOSIS — Z23 Encounter for immunization: Secondary | ICD-10-CM

## 2024-08-01 DIAGNOSIS — R600 Localized edema: Secondary | ICD-10-CM | POA: Diagnosis not present

## 2024-08-01 DIAGNOSIS — I509 Heart failure, unspecified: Secondary | ICD-10-CM | POA: Diagnosis not present

## 2024-08-01 DIAGNOSIS — E1121 Type 2 diabetes mellitus with diabetic nephropathy: Secondary | ICD-10-CM | POA: Diagnosis not present

## 2024-08-01 DIAGNOSIS — M1611 Unilateral primary osteoarthritis, right hip: Secondary | ICD-10-CM | POA: Diagnosis not present

## 2024-08-01 DIAGNOSIS — E1169 Type 2 diabetes mellitus with other specified complication: Secondary | ICD-10-CM

## 2024-08-01 DIAGNOSIS — I10 Essential (primary) hypertension: Secondary | ICD-10-CM

## 2024-08-01 DIAGNOSIS — I25118 Atherosclerotic heart disease of native coronary artery with other forms of angina pectoris: Secondary | ICD-10-CM | POA: Diagnosis not present

## 2024-08-01 DIAGNOSIS — K219 Gastro-esophageal reflux disease without esophagitis: Secondary | ICD-10-CM | POA: Diagnosis not present

## 2024-08-01 DIAGNOSIS — E785 Hyperlipidemia, unspecified: Secondary | ICD-10-CM

## 2024-08-01 DIAGNOSIS — I714 Abdominal aortic aneurysm, without rupture, unspecified: Secondary | ICD-10-CM

## 2024-08-01 DIAGNOSIS — N1832 Chronic kidney disease, stage 3b: Secondary | ICD-10-CM

## 2024-08-01 DIAGNOSIS — E66811 Obesity, class 1: Secondary | ICD-10-CM

## 2024-08-01 MED ORDER — D3 2000 50 MCG (2000 UT) PO CAPS: 1.0000 | ORAL_CAPSULE | Freq: Every day | ORAL | Status: AC

## 2024-08-01 MED ORDER — FUROSEMIDE 40 MG PO TABS: 40.0000 mg | ORAL_TABLET | Freq: Every day | ORAL | 1 refills | Status: AC | PRN

## 2024-08-01 NOTE — Assessment & Plan Note (Signed)
Chronic, stable only on PRN lasix.

## 2024-08-01 NOTE — Assessment & Plan Note (Signed)
 Stable period without recent PPI use

## 2024-08-01 NOTE — Progress Notes (Signed)
 Ph: [phone] Fax: [phone]   Patient ID: Luis Sweeney, male    DOB: 01-06-1941, 83 y.o.   MRN: [id number]  This visit was conducted in person.  BP 124/64   Pulse 81   Temp 97.8 F (36.6 C) (Oral)   Ht 6' 1 (1.854 m)   Wt 250 lb 2 oz (113.5 kg)   SpO2 96%   BMI 33.00 kg/m    CC: CPE Subjective:   HPI: Luis Sweeney is a 83 y.o. male presenting on 08/01/2024 for Annual Exam   Saw health advisor 03/2024 for medicare wellness visit. Note reviewed.  Expecting 3 great grandchildren this year!   No results found.  Flowsheet Row Office Visit from 08/01/2024 in [facility]  PHQ-2 Total Score 0       08/01/2024    9:58 AM 03/27/2024   11:39 AM 07/19/2023   10:43 AM 03/25/2023   11:17 AM 07/22/2022    2:33 PM  Fall Risk   Falls in the past year? 0 0 0 0 0  Number falls in past yr: 0 0 0 0 0  Injury with Fall? 0 0 0 0 0  Risk for fall due to : Other (Comment) No Fall Risks No Fall Risks No Fall Risks No Fall Risks  Follow up Falls evaluation completed Education provided;Falls prevention discussed Falls evaluation completed Falls prevention discussed;Falls evaluation completed Falls evaluation completed      Data saved with a previous flowsheet row definition   Stress fracture to L metatarsal followed by Dr Heide and podiatry. He had been on long course of prednisone for hip bursitis prior to this. Steroid injection lost effect.  DEXA 07/2024 - WNL with T score -0.3 RFN   CKD stage 3 due to hypertension - established with Dr Dennise nephrology. Doreen caused groin pain - he stopped this.   Preventative: COLONOSCOPY Date: 07/2014 severe diverticulosis, no rpt needed Ollen). No blood in stool or bowel changes.  Prostate cancer screening - no problems in past. Nocturia x1 per night. Strong stream. Aged out.  Lung cancer screening - not eligible  Flu - yearly COVID vaccine - completed Moderna 12/2019  Pneumovax 2004, 2009.  Prevnar-13 2016, prevnar-20 - discussed, declines Td - 2002, 05/2012  Shingrix - previously declined  Advanced directives: doesn't have set up. Full code but wouldn't want prolonged life support. Would want oldest daughter Judith Finder RN) to be [employer]. Packet previously provided.  Seat belt use discussed  Sunscreen use discussed. No changing moles on skin. Tender firm area to left ear - this is side he sleeps on. 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 Interim medical history since our last visit reviewed. Allergies and medications reviewed and updated. Outpatient Medications Prior to Visit  Medication Sig Dispense Refill   Ascorbic Acid (VITAMIN C ) 1000 MG tablet Take 1 tablet (1,000 mg total) by mouth daily.     aspirin  EC 81 MG tablet Take 81  mg by mouth daily.     calcium  carbonate (OSCAL) 1500 (600 Ca) MG TABS tablet Take by mouth 2 (two) times daily with a meal.     cyanocobalamin  (VITAMIN B12) 1000 MCG tablet Take 1,000 mcg by mouth daily.     potassium chloride  (KLOR-CON ) 10 MEQ tablet Take 1 tablet by mouth daily as needed when taking Lasix . 90 tablet 1   traMADol  (ULTRAM ) 50 MG tablet Take 1 tablet (50 mg total) by mouth 2 (two) times daily as needed. 30 tablet 0   Cholecalciferol  (D3 2000 PO) Take by mouth.     furosemide  (LASIX ) 40 MG tablet Take 1 tablet (40 mg total) by mouth daily as needed for fluid or edema. 90 tablet 1   hydrocortisone  2.5 % cream Apply topically 2 (two) times daily as needed (Rash). (Patient  not taking: Reported on 08/01/2024) 30 g 3   Omega 3 1200 MG CAPS Take 1,200 mg by mouth daily. (Patient not taking: Reported on 08/01/2024)     pantoprazole  (PROTONIX ) 20 MG tablet Take 1 tablet (20 mg total) by mouth daily as needed. (Patient not taking: Reported on 08/01/2024)     No facility-administered medications prior to visit.     Per HPI unless specifically indicated in ROS section below Review of Systems  Constitutional:  Negative for activity change, appetite change, chills, fatigue, fever and unexpected weight change.  HENT:  Negative for hearing loss.   Eyes:  Negative for visual disturbance.  Respiratory:  Negative for cough, chest tightness, shortness of breath and wheezing.   Cardiovascular:  Positive for leg swelling. Negative for chest pain and palpitations.  Gastrointestinal:  Positive for constipation (mild). Negative for abdominal distention, abdominal pain, blood in stool, diarrhea, nausea and vomiting.  Genitourinary:  Negative for difficulty urinating and hematuria.  Musculoskeletal:  Negative for arthralgias, myalgias and neck pain.  Skin:  Negative for rash.  Neurological:  Negative for dizziness, seizures, syncope and headaches.  Hematological:  Negative for adenopathy. Does not bruise/bleed easily.  Psychiatric/Behavioral:  Negative for dysphoric mood. The patient is not nervous/anxious.     Objective:  BP 124/64   Pulse 81   Temp 97.8 F (36.6 C) (Oral)   Ht 6' 1 (1.854 m)   Wt 250 lb 2 oz (113.5 kg)   SpO2 96%   BMI 33.00 kg/m   Wt Readings from Last 3 Encounters:  08/01/24 250 lb 2 oz (113.5 kg)  03/27/24 245 lb (111.1 kg)  08/30/23 245 lb 9.6 oz (111.4 kg)      Physical Exam Vitals and nursing note reviewed.  Constitutional:      General: He is not in acute distress.    Appearance: Normal appearance. He is well-developed. He is not ill-appearing.  HENT:     Head: Normocephalic and atraumatic.     Right Ear: Hearing, tympanic membrane,  ear canal and external ear normal.     Left Ear: Hearing, tympanic membrane, ear canal and external ear normal.     Mouth/Throat:     Mouth: Mucous membranes are moist.     Pharynx: Oropharynx is clear. No oropharyngeal exudate or posterior oropharyngeal erythema.  Eyes:     General: No scleral icterus.    Extraocular Movements: Extraocular movements intact.     Conjunctiva/sclera: Conjunctivae normal.     Pupils: Pupils are equal, round, and reactive to light.  Neck:     Thyroid : No thyroid  mass or thyromegaly.     Vascular: No carotid bruit.  Cardiovascular:     Rate and Rhythm: Normal rate and regular rhythm.     Pulses: Normal pulses.          Radial pulses are 2+ on the right side and 2+ on the left side.     Heart sounds: Normal heart sounds. No murmur heard. Pulmonary:     Effort: Pulmonary effort is normal. No respiratory distress.     Breath sounds: Normal breath sounds. No wheezing, rhonchi or rales.  Abdominal:     General: Bowel sounds are normal. There is no distension.     Palpations: Abdomen is soft. There is no mass.     Tenderness: There is no abdominal tenderness. There is no guarding or rebound.     Hernia: No hernia is present.  Musculoskeletal:        General: Normal range of motion.     Cervical back: Normal range of motion and neck supple.     Right lower leg: No edema.     Left lower leg: No edema.  Lymphadenopathy:     Cervical: No cervical adenopathy.  Skin:    General: Skin is warm and dry.     Findings: No rash.  Neurological:     General: No focal deficit present.     Mental Status: He is alert and oriented to person, place, and time.  Psychiatric:        Mood and Affect: Mood normal.        Behavior: Behavior normal.        Thought Content: Thought content normal.        Judgment: Judgment normal.       Results for orders placed or performed in visit on 07/25/24  Vitamin B12   Collection Time: 07/25/24  8:43 AM  Result Value Ref Range    Vitamin B-12 453 211 - 911 pg/mL  CBC with Differential/Platelet   Collection Time: 07/25/24  8:43 AM  Result Value Ref Range   WBC 6.2 4.0 - 10.5 K/uL   RBC 4.38 4.22 - 5.81 Mil/uL   Hemoglobin 13.5 13.0 - 17.0 g/dL   HCT 58.2 60.9 - 47.9 %   MCV 95.1 78.0 - 100.0 fl   MCHC 32.5 30.0 - 36.0 g/dL   RDW 85.6 88.4 - 84.4 %   Platelets 224.0 150.0 - 400.0 K/uL   Neutrophils Relative % 65.7 43.0 - 77.0 %   Lymphocytes Relative 24.2 12.0 - 46.0 %   Monocytes Relative 7.7 3.0 - 12.0 %   Eosinophils Relative 1.8 0.0 - 5.0 %   Basophils Relative 0.6 0.0 - 3.0 %   Neutro Abs 4.1 1.4 - 7.7 K/uL   Lymphs Abs 1.5 0.7 - 4.0 K/uL   Monocytes Absolute 0.5 0.1 - 1.0 K/uL   Eosinophils Absolute 0.1 0.0 - 0.7 K/uL   Basophils Absolute 0.0 0.0 - 0.1 K/uL  Parathyroid  hormone, intact (no Ca)   Collection Time: 07/25/24  8:43 AM  Result Value Ref Range   PTH 20 16 - 77 pg/mL  Microalbumin / creatinine urine ratio   Collection Time: 07/25/24  8:43 AM  Result Value Ref Range   Microalb, Ur <0.7 mg/dL   Creatinine,U 877.8 mg/dL   Microalb Creat Ratio Unable to calculate 0.0 - 30.0 mg/g  VITAMIN D  25 Hydroxy (Vit-D Deficiency, Fractures)   Collection Time: 07/25/24  8:43 AM  Result Value Ref Range   VITD 39.38 30.00 - 100.00 ng/mL  Phosphorus   Collection Time: 07/25/24  8:43 AM  Result Value Ref Range   Phosphorus 2.8 2.3 - 4.6 mg/dL  Comprehensive metabolic panel with GFR   Collection Time: 07/25/24  8:43 AM  Result Value Ref Range   Sodium 139 135 - 145 mEq/L   Potassium 4.3 3.5 - 5.1 mEq/L   Chloride 102 96 - 112 mEq/L   CO2 31 19 - 32 mEq/L   Glucose, Bld 107 (H) 70 - 99 mg/dL   BUN 25 (H) 6 - 23 mg/dL   Creatinine, Ser 8.24 (H) 0.40 - 1.50 mg/dL   Total Bilirubin 0.6 0.2 - 1.2 mg/dL   Alkaline Phosphatase 30 (L) 39 - 117 U/L   AST 13 0 - 37 U/L   ALT 8 0 - 53 U/L   Total Protein 5.8 (L) 6.0 - 8.3 g/dL   Albumin 3.9 3.5 - 5.2 g/dL   GFR 64.48 (L) >39.99 mL/min   Calcium  8.8 8.4 -  10.5 mg/dL  Lipid panel   Collection Time: 07/25/24  8:43 AM  Result Value Ref Range   Cholesterol 162 0 - 200 mg/dL   Triglycerides 38.9 0.0 - 149.0 mg/dL   HDL 55.79 >60.99 mg/dL   VLDL 87.7 0.0 - 59.9 mg/dL   LDL Cholesterol 894 (H) 0 - 99 mg/dL   Total CHOL/HDL Ratio 4    NonHDL 117.36   Hemoglobin A1c   Collection Time: 07/25/24  8:43 AM  Result Value Ref Range   Hgb A1c MFr Bld 6.6 (H) 4.6 - 6.5 %    Assessment & Plan:   Problem List Items Addressed This Visit     Advanced care planning/counseling discussion (Chronic)   Previously discussed - encouraged he work on this.       Health maintenance examination - Primary (Chronic)   Preventative protocols reviewed and updated unless pt declined. Discussed healthy diet and lifestyle.       Hyperlipidemia associated with type 2 diabetes mellitus (HCC)   Reviewed recent cholesterol levels - and increased LDL - rec diet choices to improve cholesterol. Previous intolerance to statin trial (atorva, simva). Will remain off statin at this time.  The ASCVD Risk score (Arnett DK, et al., 2019) failed to calculate for the following reasons:   The 2019 ASCVD risk score is only valid for ages 65 to 2   Risk score cannot be calculated because patient has a medical history suggesting prior/existing ASCVD       Relevant Medications   furosemide  (LASIX ) 40 MG tablet   Coronary artery disease of native artery of native heart with stable angina pectoris   Continue aspirin  daily and routine cards f/u.       Relevant Medications   furosemide  (LASIX ) 40 MG tablet   GERD   Stable period without recent PPI use      Chronic kidney disease, stage 3b (HCC)   Appreciate nephrology care Jil) - seeing q7mo.       ERB'S PALSY   Vitamin B12 deficiency   Levels normal on daily replacement - continue.       Obesity, Class I, BMI 30-34.9   Reviewed healthy diet and lifestyle changes to effect sustainable weight loss.        Type 2  diabetes with nephropathy (HCC)   Chronic, diet controlled. Reviewed limiting sugar/carbs in diet.       Essential hypertension   Chronic, stable only on PRN lasix .       Relevant Medications   furosemide  (LASIX ) 40 MG tablet  Primary osteoarthritis of right hip   Was on daily prednisone but developed metatarsal stress fracture.  Now off prednisone.  Avoid NSAIDs in CAD, CKD history.       CHF (congestive heart failure) (HCC)   Overall euvolemic - continue PRN lasix .       Relevant Medications   furosemide  (LASIX ) 40 MG tablet   AAA (abdominal aortic aneurysm)   2.7cm AAA on imaging 06/2022 - rec rpt 5 yrs.       Relevant Medications   furosemide  (LASIX ) 40 MG tablet   Pedal edema   Ongoing, stable period on PRN lasix  40mg  - takes about twice a week.       Relevant Medications   furosemide  (LASIX ) 40 MG tablet   Other Visit Diagnoses       Encounter for immunization       Relevant Orders   Flu vaccine HIGH DOSE PF(Fluzone Trivalent) (Completed)        Meds ordered this encounter  Medications   Cholecalciferol  (D3 2000) 50 MCG (2000 UT) CAPS    Sig: Take 1 capsule (2,000 Units total) by mouth daily.   furosemide  (LASIX ) 40 MG tablet    Sig: Take 1 tablet (40 mg total) by mouth daily as needed for fluid or edema.    Dispense:  90 tablet    Refill:  1    Orders Placed This Encounter  Procedures   Flu vaccine HIGH DOSE PF(Fluzone Trivalent)    Patient Instructions  Flu shot today Consider prevnar-20 (pneumonia shot)  Work on fiber in diet to help constipation and cholesterol control.  Continue current medicines.  Good to see you today  Return as needed or in 1 year for next physical.   Follow up plan: Return in about 1 year (around 08/01/2025) for medicare wellness visit, annual exam, prior fasting for blood work.  Anton Blas, MD

## 2024-08-01 NOTE — Assessment & Plan Note (Addendum)
 Continue aspirin  daily and routine cards f/u.

## 2024-08-01 NOTE — Assessment & Plan Note (Signed)
 Reviewed healthy diet and lifestyle changes to effect sustainable weight loss.

## 2024-08-01 NOTE — Patient Instructions (Addendum)
 Flu shot today Consider prevnar-20 (pneumonia shot)  Work on fiber in diet to help constipation and cholesterol control.  Continue current medicines.  Good to see you today  Return as needed or in 1 year for next physical.

## 2024-08-01 NOTE — Assessment & Plan Note (Signed)
2.7cm AAA on imaging 06/2022 - rec rpt 5 yrs.

## 2024-08-01 NOTE — Assessment & Plan Note (Signed)
 Was on daily prednisone but developed metatarsal stress fracture.  Now off prednisone.  Avoid NSAIDs in CAD, CKD history.

## 2024-08-01 NOTE — Assessment & Plan Note (Signed)
 Appreciate nephrology care Jil) - seeing q41mo.

## 2024-08-01 NOTE — Assessment & Plan Note (Signed)
 Reviewed recent cholesterol levels - and increased LDL - rec diet choices to improve cholesterol. Previous intolerance to statin trial (atorva, simva). Will remain off statin at this time.  The ASCVD Risk score (Arnett DK, et al., 2019) failed to calculate for the following reasons:   The 2019 ASCVD risk score is only valid for ages 86 to 72   Risk score cannot be calculated because patient has a medical history suggesting prior/existing ASCVD

## 2024-08-01 NOTE — Assessment & Plan Note (Signed)
 Previously discussed - encouraged he work on this.

## 2024-08-01 NOTE — Assessment & Plan Note (Signed)
 Preventative protocols reviewed and updated unless pt declined. Discussed healthy diet and lifestyle.

## 2024-08-01 NOTE — Assessment & Plan Note (Signed)
 Levels normal on daily replacement - continue.

## 2024-08-01 NOTE — Assessment & Plan Note (Signed)
 Overall euvolemic - continue PRN lasix .

## 2024-08-01 NOTE — Assessment & Plan Note (Signed)
 Ongoing, stable period on PRN lasix  40mg  - takes about twice a week.

## 2024-08-01 NOTE — Assessment & Plan Note (Signed)
 Chronic, diet controlled. Reviewed limiting sugar/carbs in diet.

## 2024-08-14 DIAGNOSIS — M25551 Pain in right hip: Secondary | ICD-10-CM | POA: Diagnosis not present

## 2024-08-23 ENCOUNTER — Other Ambulatory Visit: Payer: Self-pay | Admitting: Family Medicine

## 2024-08-23 DIAGNOSIS — M1611 Unilateral primary osteoarthritis, right hip: Secondary | ICD-10-CM

## 2024-08-23 NOTE — Telephone Encounter (Signed)
 Last OV: 08/01/24 Pending OV: 08/03/25 Medication: Tramadol  50mg  Directions: Take one tablet BID Last Refill: 10/28/23 Qty: #30 with 0 refills

## 2024-08-24 NOTE — Telephone Encounter (Signed)
 ERx

## 2024-08-25 ENCOUNTER — Encounter: Payer: Self-pay | Admitting: Pharmacist

## 2024-08-25 NOTE — Progress Notes (Signed)
 Pharmacy Quality Measure Review  This patient is appearing on a report for being at risk of failing the Controlling Blood Pressure measure this calendar year.   Last documented BP  BP Readings from Last 1 Encounters:  08/01/24 124/64   does meet criteria for measure closure (BP <140/90).  KED: Pass SPC: Fail  2025 f/u scheduled: Nephrology 10/30  Encounter note placed 08/03/25, ICD10 exclusion  ICD-10-CM code exceptions:  Muscular pain:  Myopathy ICD-10-CM codes include G72.0, G72.2, and G72.9.  Myalgia ICD-10-CM codes include M79.10, M79.11, M79.12, and more.   Future Appointments  Date Time Provider Department Center  03/28/2025 11:30 AM [facility] ANNUAL WELLNESS VISIT 1 [facility] [address]  07/27/2025 10:00 AM [facility] LAB [facility] [address]  08/03/2025  9:30 AM Rilla Baller, MD [facility] [address]

## 2024-09-19 ENCOUNTER — Other Ambulatory Visit: Payer: Self-pay | Admitting: Family Medicine

## 2024-09-19 DIAGNOSIS — M1611 Unilateral primary osteoarthritis, right hip: Secondary | ICD-10-CM

## 2024-09-19 NOTE — Telephone Encounter (Signed)
 ERx

## 2024-10-02 ENCOUNTER — Telehealth: Admitting: Physician Assistant

## 2024-10-02 ENCOUNTER — Telehealth: Payer: Self-pay

## 2024-10-02 DIAGNOSIS — B9689 Other specified bacterial agents as the cause of diseases classified elsewhere: Secondary | ICD-10-CM | POA: Diagnosis not present

## 2024-10-02 DIAGNOSIS — J019 Acute sinusitis, unspecified: Secondary | ICD-10-CM

## 2024-10-02 DIAGNOSIS — R051 Acute cough: Secondary | ICD-10-CM

## 2024-10-02 MED ORDER — BENZONATATE 100 MG PO CAPS: 100.0000 mg | ORAL_CAPSULE | Freq: Three times a day (TID) | ORAL | 0 refills | Status: AC | PRN

## 2024-10-02 MED ORDER — AMOXICILLIN-POT CLAVULANATE 875-125 MG PO TABS: 1.0000 | ORAL_TABLET | Freq: Two times a day (BID) | ORAL | 0 refills | Status: AC

## 2024-10-02 NOTE — Progress Notes (Signed)
"      E-Visit for Sinus Problems  We are sorry that you are not feeling well.  Here is how we plan to help!  Based on what you have shared with me it looks like you have sinusitis.  Sinusitis is inflammation and infection in the sinus cavities of the head.  Based on your presentation I believe you most likely have Acute Bacterial Sinusitis.  This is an infection caused by bacteria and is treated with antibiotics. I have prescribed Augmentin  875mg /125mg  one tablet twice daily with food, for 7 days. I have also prescribed Tessalon  100mg  Take 1 capsule every 8 hours as needed for cough. You may use an oral decongestant such as Mucinex D or if you have glaucoma or high blood pressure use plain Mucinex. Saline nasal spray help and can safely be used as often as needed for congestion.  If you develop worsening sinus pain, fever or notice severe headache and vision changes, or if symptoms are not better after completion of antibiotic, please schedule an appointment with a health care provider.    Sinus infections are not as easily transmitted as other respiratory infection, however we still recommend that you avoid close contact with loved ones, especially the very young and elderly.  Remember to wash your hands thoroughly throughout the day as this is the number one way to prevent the spread of infection!  Home Care: Only take medications as instructed by your medical team. Complete the entire course of an antibiotic. Do not take these medications with alcohol. A steam or ultrasonic humidifier can help congestion.  You can place a towel over your head and breathe in the steam from hot water  coming from a faucet. Avoid close contacts especially the very young and the elderly. Cover your mouth when you cough or sneeze. Always remember to wash your hands.  Get Help Right Away If: You develop worsening fever or sinus pain. You develop a severe head ache or visual changes. Your symptoms persist after you have  completed your treatment plan.  Make sure you Understand these instructions. Will watch your condition. Will get help right away if you are not doing well or get worse.  Your e-visit answers were reviewed by a board certified advanced clinical practitioner to complete your personal care plan.  Depending on the condition, your plan could have included both over the counter or prescription medications.  If there is a problem please reply  once you have received a response from your provider.  Your safety is important to us .  If you have drug allergies check your prescription carefully.    You can use MyChart to ask questions about todays visit, request a non-urgent call back, or ask for a work or school excuse for 24 hours related to this e-Visit. If it has been greater than 24 hours you will need to follow up with your provider, or enter a new e-Visit to address those concerns.  You will get an e-mail in the next two days asking about your experience.  I hope that your e-visit has been valuable and will speed your recovery. Thank you for using e-visits.  I have spent 5 minutes in review of e-visit questionnaire, review and updating patient chart, medical decision making and response to patient.   Luis CHRISTELLA Dickinson, PA-C  "

## 2024-10-02 NOTE — Telephone Encounter (Signed)
 Copied from CRM #[id number]. Topic: Clinical - Medical Advice >> Oct 02, 2024 11:47 AM Emylou G wrote: Reason for CRM: would like antibiotic: congestion head and chest, coughing ( trying to cough things up but nothing is coming up ) Been sleeping in recliner for a week.  Would rather not be seen.   Looks like patient was evaluated in E visit today.  No further action needed at this time.

## 2024-10-02 NOTE — Telephone Encounter (Signed)
 Noted. Thanks.

## 2024-10-23 ENCOUNTER — Other Ambulatory Visit: Payer: Self-pay | Admitting: Family Medicine

## 2024-10-23 DIAGNOSIS — M1611 Unilateral primary osteoarthritis, right hip: Secondary | ICD-10-CM

## 2024-10-24 NOTE — Telephone Encounter (Signed)
 Requesting: Tramadol   Contract: No UDS:  Last Visit: 08/01/2024 Next Visit: Visit date not found Last Refill: 09/19/2024   Please Advise

## 2024-10-25 NOTE — Telephone Encounter (Signed)
 ERx

## 2025-03-28 ENCOUNTER — Ambulatory Visit

## 2025-07-27 ENCOUNTER — Other Ambulatory Visit

## 2025-08-03 ENCOUNTER — Encounter: Admitting: Family Medicine
